# Patient Record
Sex: Male | Born: 1965 | Race: White | Hispanic: No | Smoking: Former smoker
Health system: Southern US, Community
[De-identification: ages and names within clinical notes are randomized; demographics above are authoritative.]

## PROBLEM LIST (undated history)

## (undated) DIAGNOSIS — M1990 Unspecified osteoarthritis, unspecified site: Secondary | ICD-10-CM

## (undated) DIAGNOSIS — C801 Malignant (primary) neoplasm, unspecified: Secondary | ICD-10-CM

## (undated) DIAGNOSIS — C229 Malignant neoplasm of liver, not specified as primary or secondary: Secondary | ICD-10-CM

## (undated) DIAGNOSIS — E119 Type 2 diabetes mellitus without complications: Secondary | ICD-10-CM

## (undated) DIAGNOSIS — I10 Essential (primary) hypertension: Secondary | ICD-10-CM

## (undated) HISTORY — PX: LIVER LOBECTOMY: SHX1976

## (undated) HISTORY — PX: HERNIA REPAIR: SHX51

---

## 2019-04-02 ENCOUNTER — Emergency Department: Payer: Medicare (Managed Care)

## 2019-04-02 ENCOUNTER — Inpatient Hospital Stay
Admission: EM | Admit: 2019-04-02 | Discharge: 2019-04-05 | DRG: 872 | Disposition: A | Discharge status: Home / Self Care | Payer: Medicare (Managed Care) | Attending: Internal Medicine | Admitting: Internal Medicine

## 2019-04-02 ENCOUNTER — Other Ambulatory Visit: Payer: Self-pay

## 2019-04-02 DIAGNOSIS — Y92410 Unspecified street and highway as the place of occurrence of the external cause: Secondary | ICD-10-CM | POA: Diagnosis not present

## 2019-04-02 DIAGNOSIS — Z794 Long term (current) use of insulin: Secondary | ICD-10-CM | POA: Diagnosis not present

## 2019-04-02 DIAGNOSIS — F1110 Opioid abuse, uncomplicated: Secondary | ICD-10-CM | POA: Diagnosis present

## 2019-04-02 DIAGNOSIS — Z8719 Personal history of other diseases of the digestive system: Secondary | ICD-10-CM

## 2019-04-02 DIAGNOSIS — A419 Sepsis, unspecified organism: Secondary | ICD-10-CM | POA: Diagnosis present

## 2019-04-02 DIAGNOSIS — R0781 Pleurodynia: Secondary | ICD-10-CM | POA: Diagnosis present

## 2019-04-02 DIAGNOSIS — G720 Drug-induced myopathy: Secondary | ICD-10-CM | POA: Diagnosis present

## 2019-04-02 DIAGNOSIS — I3139 Other pericardial effusion (noninflammatory): Secondary | ICD-10-CM | POA: Diagnosis present

## 2019-04-02 DIAGNOSIS — E785 Hyperlipidemia, unspecified: Secondary | ICD-10-CM | POA: Diagnosis present

## 2019-04-02 DIAGNOSIS — E1165 Type 2 diabetes mellitus with hyperglycemia: Secondary | ICD-10-CM | POA: Diagnosis present

## 2019-04-02 DIAGNOSIS — K3184 Gastroparesis: Secondary | ICD-10-CM | POA: Diagnosis present

## 2019-04-02 DIAGNOSIS — Z888 Allergy status to other drugs, medicaments and biological substances status: Secondary | ICD-10-CM | POA: Diagnosis not present

## 2019-04-02 DIAGNOSIS — E781 Pure hyperglyceridemia: Secondary | ICD-10-CM | POA: Diagnosis present

## 2019-04-02 DIAGNOSIS — I313 Pericardial effusion (noninflammatory): Secondary | ICD-10-CM | POA: Diagnosis present

## 2019-04-02 DIAGNOSIS — F329 Major depressive disorder, single episode, unspecified: Secondary | ICD-10-CM | POA: Diagnosis present

## 2019-04-02 DIAGNOSIS — M47812 Spondylosis without myelopathy or radiculopathy, cervical region: Secondary | ICD-10-CM | POA: Diagnosis present

## 2019-04-02 DIAGNOSIS — I10 Essential (primary) hypertension: Secondary | ICD-10-CM | POA: Diagnosis present

## 2019-04-02 DIAGNOSIS — F1410 Cocaine abuse, uncomplicated: Secondary | ICD-10-CM | POA: Diagnosis present

## 2019-04-02 DIAGNOSIS — Z79899 Other long term (current) drug therapy: Secondary | ICD-10-CM

## 2019-04-02 DIAGNOSIS — Z20828 Contact with and (suspected) exposure to other viral communicable diseases: Secondary | ICD-10-CM | POA: Diagnosis present

## 2019-04-02 DIAGNOSIS — K2210 Ulcer of esophagus without bleeding: Secondary | ICD-10-CM | POA: Diagnosis present

## 2019-04-02 DIAGNOSIS — Z1159 Encounter for screening for other viral diseases: Secondary | ICD-10-CM

## 2019-04-02 DIAGNOSIS — E1143 Type 2 diabetes mellitus with diabetic autonomic (poly)neuropathy: Secondary | ICD-10-CM | POA: Diagnosis present

## 2019-04-02 DIAGNOSIS — Z87891 Personal history of nicotine dependence: Secondary | ICD-10-CM

## 2019-04-02 DIAGNOSIS — Z8505 Personal history of malignant neoplasm of liver: Secondary | ICD-10-CM | POA: Diagnosis not present

## 2019-04-02 HISTORY — DX: Unspecified osteoarthritis, unspecified site: M19.90

## 2019-04-02 HISTORY — DX: Essential (primary) hypertension: I10

## 2019-04-02 HISTORY — DX: Type 2 diabetes mellitus without complications: E11.9

## 2019-04-02 HISTORY — DX: Malignant (primary) neoplasm, unspecified: C80.1

## 2019-04-02 LAB — CBC WITH DIFFERENTIAL/PLATELET
Abs Immature Granulocytes: 0.02 10*3/uL (ref 0.00–0.07)
Basophils Absolute: 0 10*3/uL (ref 0.0–0.1)
Basophils Relative: 0 %
Eosinophils Absolute: 0 10*3/uL (ref 0.0–0.5)
Eosinophils Relative: 0 %
HCT: 31 % — ABNORMAL LOW (ref 39.0–52.0)
Hemoglobin: 10 g/dL — ABNORMAL LOW (ref 13.0–17.0)
Immature Granulocytes: 0 %
Lymphocytes Relative: 21 %
Lymphs Abs: 1.8 10*3/uL (ref 0.7–4.0)
MCH: 28.2 pg (ref 26.0–34.0)
MCHC: 32.3 g/dL (ref 30.0–36.0)
MCV: 87.3 fL (ref 80.0–100.0)
Monocytes Absolute: 0.8 10*3/uL (ref 0.1–1.0)
Monocytes Relative: 9 %
Neutro Abs: 6 10*3/uL (ref 1.7–7.7)
Neutrophils Relative %: 70 %
Platelets: 239 10*3/uL (ref 150–400)
RBC: 3.55 MIL/uL — ABNORMAL LOW (ref 4.22–5.81)
RDW: 13.6 % (ref 11.5–15.5)
WBC: 8.6 10*3/uL (ref 4.0–10.5)
nRBC: 0 % (ref 0.0–0.2)

## 2019-04-02 LAB — COMPREHENSIVE METABOLIC PANEL
ALT: 35 U/L (ref 0–44)
AST: 31 U/L (ref 15–41)
Albumin: 3.2 g/dL — ABNORMAL LOW (ref 3.5–5.0)
Alkaline Phosphatase: 119 U/L (ref 38–126)
Anion gap: 10 (ref 5–15)
BUN: 12 mg/dL (ref 6–20)
CO2: 25 mmol/L (ref 22–32)
Calcium: 8.4 mg/dL — ABNORMAL LOW (ref 8.9–10.3)
Chloride: 98 mmol/L (ref 98–111)
Creatinine, Ser: 0.9 mg/dL (ref 0.61–1.24)
GFR calc Af Amer: >60 mL/min (ref >60)
GFR calc non Af Amer: >60 mL/min (ref >60)
Glucose, Bld: 205 mg/dL — ABNORMAL HIGH (ref 70–99)
Potassium: 3.9 mmol/L (ref 3.5–5.1)
Sodium: 133 mmol/L — ABNORMAL LOW (ref 135–145)
Total Bilirubin: 1.1 mg/dL (ref 0.3–1.2)
Total Protein: 7.8 g/dL (ref 6.5–8.1)

## 2019-04-02 LAB — URINE DRUG SCREEN, QUALITATIVE (ARMC ONLY)
Amphetamines, Ur Screen: NOT DETECTED
Barbiturates, Ur Screen: NOT DETECTED
Benzodiazepine, Ur Scrn: NOT DETECTED
Cannabinoid 50 Ng, Ur ~~LOC~~: POSITIVE — AB
Cocaine Metabolite,Ur ~~LOC~~: POSITIVE — AB
MDMA (Ecstasy)Ur Screen: NOT DETECTED
Methadone Scn, Ur: NOT DETECTED
Opiate, Ur Screen: POSITIVE — AB
Phencyclidine (PCP) Ur S: NOT DETECTED
Tricyclic, Ur Screen: NOT DETECTED

## 2019-04-02 LAB — PROTIME-INR
INR: 1.2 (ref 0.8–1.2)
Prothrombin Time: 14.8 seconds (ref 11.4–15.2)

## 2019-04-02 LAB — PROCALCITONIN: Procalcitonin: 0.1 ng/mL

## 2019-04-02 LAB — LIPASE, BLOOD: Lipase: 19 U/L (ref 11–51)

## 2019-04-02 LAB — LACTIC ACID, PLASMA
Lactic Acid, Venous: 1.8 mmol/L (ref 0.5–1.9)
Lactic Acid, Venous: 1.9 mmol/L (ref 0.5–1.9)

## 2019-04-02 LAB — URINALYSIS, ROUTINE W REFLEX MICROSCOPIC
Bilirubin Urine: NEGATIVE
Glucose, UA: 50 mg/dL — AB
Hgb urine dipstick: NEGATIVE
Ketones, ur: NEGATIVE mg/dL
Leukocytes,Ua: NEGATIVE
Nitrite: NEGATIVE
Protein, ur: NEGATIVE mg/dL
Specific Gravity, Urine: >1.046 — ABNORMAL HIGH (ref 1.005–1.030)
pH: 5 (ref 5.0–8.0)

## 2019-04-02 LAB — SARS CORONAVIRUS 2 BY RT PCR (HOSPITAL ORDER, PERFORMED IN ~~LOC~~ HOSPITAL LAB): SARS Coronavirus 2: NEGATIVE

## 2019-04-02 LAB — TROPONIN I
Troponin I: <0.03 ng/mL (ref <0.03)
Troponin I: <0.03 ng/mL (ref <0.03)
Troponin I: <0.03 ng/mL (ref <0.03)

## 2019-04-02 LAB — BRAIN NATRIURETIC PEPTIDE: B Natriuretic Peptide: 60 pg/mL (ref 0.0–100.0)

## 2019-04-02 LAB — GLUCOSE, CAPILLARY: Glucose-Capillary: 452 mg/dL — ABNORMAL HIGH (ref 70–99)

## 2019-04-02 MED ORDER — POLYVINYL ALCOHOL 1.4 % OP SOLN
2.0000 [drp] | Freq: Three times a day (TID) | OPHTHALMIC | Status: DC
  Administered 2019-04-02 – 2019-04-05 (×6): 2 [drp] via OPHTHALMIC
  Filled 2019-04-02: qty 15

## 2019-04-02 MED ORDER — SODIUM CHLORIDE 0.9% FLUSH
10.0000 mL | Freq: Two times a day (BID) | INTRAVENOUS | Status: DC
  Administered 2019-04-02 – 2019-04-04 (×4): 10 mL via INTRAVENOUS

## 2019-04-02 MED ORDER — SUCRALFATE 1 G PO TABS
1.0000 g | ORAL_TABLET | Freq: Four times a day (QID) | ORAL | Status: DC
  Administered 2019-04-02 – 2019-04-05 (×11): 1 g via ORAL
  Filled 2019-04-02 (×11): qty 1

## 2019-04-02 MED ORDER — ACETAMINOPHEN 160 MG/5ML PO SOLN
650.0000 mg | ORAL | Status: AC
  Administered 2019-04-02: 650 mg via ORAL
  Filled 2019-04-02: qty 20.3

## 2019-04-02 MED ORDER — OXYCODONE-ACETAMINOPHEN 7.5-325 MG PO TABS
1.0000 | ORAL_TABLET | Freq: Four times a day (QID) | ORAL | Status: DC | PRN
  Administered 2019-04-02 – 2019-04-03 (×4): 1 via ORAL
  Filled 2019-04-02 (×4): qty 1

## 2019-04-02 MED ORDER — SODIUM CHLORIDE 0.9 % IV SOLN
INTRAVENOUS | Status: DC | PRN
  Administered 2019-04-02: 20 mL via INTRAVENOUS
  Administered 2019-04-03: 50 mL via INTRAVENOUS
  Administered 2019-04-03: 30 mL via INTRAVENOUS
  Administered 2019-04-03: 25 mL via INTRAVENOUS

## 2019-04-02 MED ORDER — METOCLOPRAMIDE HCL 10 MG PO TABS
10.0000 mg | ORAL_TABLET | Freq: Three times a day (TID) | ORAL | Status: DC
  Administered 2019-04-02 – 2019-04-05 (×11): 10 mg via ORAL
  Filled 2019-04-02 (×11): qty 1

## 2019-04-02 MED ORDER — INSULIN ASPART 100 UNIT/ML ~~LOC~~ SOLN
10.0000 [IU] | Freq: Once | SUBCUTANEOUS | Status: AC
  Administered 2019-04-02: 10 [IU] via SUBCUTANEOUS
  Filled 2019-04-02: qty 1

## 2019-04-02 MED ORDER — IOHEXOL 350 MG/ML SOLN
75.0000 mL | Freq: Once | INTRAVENOUS | Status: AC | PRN
  Administered 2019-04-02: 75 mL via INTRAVENOUS
  Filled 2019-04-02: qty 75

## 2019-04-02 MED ORDER — DICYCLOMINE HCL 10 MG PO CAPS
10.0000 mg | ORAL_CAPSULE | Freq: Four times a day (QID) | ORAL | Status: DC
  Administered 2019-04-02 – 2019-04-05 (×11): 10 mg via ORAL
  Filled 2019-04-02 (×11): qty 1

## 2019-04-02 MED ORDER — SODIUM CHLORIDE 0.9 % IV BOLUS
1000.0000 mL | Freq: Once | INTRAVENOUS | Status: AC
  Administered 2019-04-02: 1000 mL via INTRAVENOUS

## 2019-04-02 MED ORDER — ENOXAPARIN SODIUM 40 MG/0.4ML ~~LOC~~ SOLN
40.0000 mg | SUBCUTANEOUS | Status: DC
  Administered 2019-04-02 – 2019-04-04 (×3): 40 mg via SUBCUTANEOUS
  Filled 2019-04-02 (×3): qty 0.4

## 2019-04-02 MED ORDER — SODIUM CHLORIDE 0.9 % IV SOLN
2.0000 g | Freq: Once | INTRAVENOUS | Status: AC
  Administered 2019-04-02: 2 g via INTRAVENOUS
  Filled 2019-04-02: qty 2

## 2019-04-02 MED ORDER — VANCOMYCIN HCL 10 G IV SOLR
1250.0000 mg | Freq: Two times a day (BID) | INTRAVENOUS | Status: DC
  Filled 2019-04-02 (×2): qty 1250

## 2019-04-02 MED ORDER — VANCOMYCIN HCL 1.25 G IV SOLR
1250.0000 mg | Freq: Two times a day (BID) | INTRAVENOUS | Status: DC
  Administered 2019-04-02 – 2019-04-04 (×4): 1250 mg via INTRAVENOUS
  Filled 2019-04-02 (×5): qty 1250

## 2019-04-02 MED ORDER — TAMSULOSIN HCL 0.4 MG PO CAPS
0.4000 mg | ORAL_CAPSULE | Freq: Every day | ORAL | Status: DC
  Administered 2019-04-02 – 2019-04-05 (×4): 0.4 mg via ORAL
  Filled 2019-04-02 (×4): qty 1

## 2019-04-02 MED ORDER — SODIUM CHLORIDE 0.9 % IV SOLN
2.0000 g | Freq: Three times a day (TID) | INTRAVENOUS | Status: DC
  Administered 2019-04-02 – 2019-04-04 (×6): 2 g via INTRAVENOUS
  Filled 2019-04-02 (×8): qty 2

## 2019-04-02 MED ORDER — MORPHINE SULFATE (PF) 4 MG/ML IV SOLN
4.0000 mg | Freq: Once | INTRAVENOUS | Status: AC
  Administered 2019-04-02: 4 mg via INTRAVENOUS
  Filled 2019-04-02: qty 1

## 2019-04-02 MED ORDER — INSULIN ASPART 100 UNIT/ML ~~LOC~~ SOLN
0.0000 [IU] | Freq: Every day | SUBCUTANEOUS | Status: DC
  Administered 2019-04-03: 3 [IU] via SUBCUTANEOUS
  Filled 2019-04-02: qty 1

## 2019-04-02 MED ORDER — INSULIN ASPART 100 UNIT/ML ~~LOC~~ SOLN
0.0000 [IU] | Freq: Three times a day (TID) | SUBCUTANEOUS | Status: DC
  Administered 2019-04-03 (×2): 5 [IU] via SUBCUTANEOUS
  Administered 2019-04-03: 9 [IU] via SUBCUTANEOUS
  Administered 2019-04-04: 5 [IU] via SUBCUTANEOUS
  Filled 2019-04-02 (×3): qty 1

## 2019-04-02 MED ORDER — INSULIN GLARGINE 100 UNIT/ML ~~LOC~~ SOLN
40.0000 [IU] | Freq: Two times a day (BID) | SUBCUTANEOUS | Status: DC
  Administered 2019-04-02 – 2019-04-03 (×3): 40 [IU] via SUBCUTANEOUS
  Filled 2019-04-02 (×6): qty 0.4

## 2019-04-02 MED ORDER — PANTOPRAZOLE SODIUM 40 MG PO TBEC
40.0000 mg | DELAYED_RELEASE_TABLET | Freq: Two times a day (BID) | ORAL | Status: DC
  Administered 2019-04-02 – 2019-04-05 (×6): 40 mg via ORAL
  Filled 2019-04-02 (×6): qty 1

## 2019-04-02 MED ORDER — MORPHINE SULFATE (PF) 2 MG/ML IV SOLN
2.0000 mg | INTRAVENOUS | Status: DC | PRN
  Administered 2019-04-02: 2 mg via INTRAVENOUS
  Filled 2019-04-02 (×2): qty 1

## 2019-04-02 MED ORDER — NEOMYCIN-POLYMYXIN-DEXAMETH 3.5-10000-0.1 OP SUSP
1.0000 [drp] | Freq: Four times a day (QID) | OPHTHALMIC | Status: DC
  Administered 2019-04-02 – 2019-04-05 (×9): 1 [drp] via OPHTHALMIC
  Filled 2019-04-02 (×2): qty 5

## 2019-04-02 MED ORDER — VANCOMYCIN HCL IN DEXTROSE 1-5 GM/200ML-% IV SOLN
1000.0000 mg | Freq: Once | INTRAVENOUS | Status: AC
  Administered 2019-04-02: 1000 mg via INTRAVENOUS
  Filled 2019-04-02: qty 200

## 2019-04-02 NOTE — ED Notes (Signed)
Patient transported to CT 

## 2019-04-02 NOTE — ED Notes (Signed)
ED TO INPATIENT HANDOFF REPORT  ED Nurse Name and Phone #: Gwenette Greet [phone]  S Name/Age/Gender Kenneth Copeland 53 y.o. male Room/Bed: ED30A/ED30A  Code Status   Code Status: Full Code  Home/SNF/Other Selfcare Patient oriented to: self, place, time and situation Is this baseline? Yes   Triage Complete: Triage complete  Chief Complaint Difficulty Breathing   Triage Note Pt c/o SOB, weakness, and ongoing chest pain for several weeks. States he is struggling to breathe deeply and it hurts to breathe. CP goes across chest. Pt is pale, clammy, and appears SOB upon arrival. Pt states he was recently dc'd from hospital in [place], for the same symptoms. Pt. States CP worse when lying down. A&O x 4 upon arrival to ED.    Allergies Allergies  Allergen Reactions  . Metformin And Related     Pt. States it makes his stomach bleed because of a stomach ulcer  . Motrin [Ibuprofen]     Pt. States it "inflames his stomach, causes bleeding in stomach"    Level of Care/Admitting Diagnosis ED Disposition    ED Disposition Condition [place]: [place] [[id number]]  Level of Care: Med-Surg [16]  Covid Evaluation: Person Under Investigation (PUI)  Isolation Risk Level: Low Risk/Droplet (Less than 4L [place] supplementation)  Diagnosis: Pericardial effusion [[id number]]  Admitting Physician: Lang Snow [[id number]]  Attending Physician: Hyman Bible DODD [[id number]]  Estimated length of stay: past midnight tomorrow  Certification:: I certify this patient will need inpatient services for at least 2 midnights  PT Class (Do Not Modify): Inpatient [101]  PT Acc Code (Do Not Modify): Private [1]       B Medical/Surgery History Past Medical History:  Diagnosis Date  . Arthritis   . Cancer ([place])   . Diabetes mellitus without complication ([place])   . Hypertension    Past Surgical History:  Procedure Laterality Date  . HERNIA REPAIR    . LIVER  LOBECTOMY       A IV Location/Drains/Wounds Patient Lines/Drains/Airways Status   Active Line/Drains/Airways    Name:   Placement date:   Placement time:   Site:   Days:   Peripheral IV 04/02/19 Right Antecubital   04/02/19    0900    Antecubital   less than 1   Peripheral IV 04/02/19 Left Forearm   04/02/19    0927    Forearm   less than 1          Intake/Output Last 24 hours  Intake/Output Summary (Last 24 hours) at 04/02/2019 1548 Last data filed at 04/02/2019 1323 Gross per 24 hour  Intake 1199 ml  Output -  Net 1199 ml    Labs/Imaging Results for orders placed or performed during the hospital encounter of 04/02/19 (from the past 48 hour(s))  CBC with Differential     Status: Abnormal   Collection Time: 04/02/19  9:09 AM  Result Value Ref Range   WBC 8.6 4.0 - 10.5 K/uL   RBC 3.55 (L) 4.22 - 5.81 MIL/uL   Hemoglobin 10.0 (L) 13.0 - 17.0 g/dL   HCT 31.0 (L) 39.0 - 52.0 %   MCV 87.3 80.0 - 100.0 fL   MCH 28.2 26.0 - 34.0 pg   MCHC 32.3 30.0 - 36.0 g/dL   RDW 13.6 11.5 - 15.5 %   Platelets 239 150 - 400 K/uL   nRBC 0.0 0.0 - 0.2 %   Neutrophils Relative % 70 %   Neutro  Abs 6.0 1.7 - 7.7 K/uL   Lymphocytes Relative 21 %   Lymphs Abs 1.8 0.7 - 4.0 K/uL   Monocytes Relative 9 %   Monocytes Absolute 0.8 0.1 - 1.0 K/uL   Eosinophils Relative 0 %   Eosinophils Absolute 0.0 0.0 - 0.5 K/uL   Basophils Relative 0 %   Basophils Absolute 0.0 0.0 - 0.1 K/uL   Immature Granulocytes 0 %   Abs Immature Granulocytes 0.02 0.00 - 0.07 K/uL    Comment: Performed at [facility], [address] [place], [place] [address]  Comprehensive metabolic panel     Status: Abnormal   Collection Time: 04/02/19  9:09 AM  Result Value Ref Range   Sodium 133 (L) 135 - 145 mmol/L   Potassium 3.9 3.5 - 5.1 mmol/L   Chloride 98 98 - 111 mmol/L   CO2 25 22 - 32 mmol/L   Glucose, Bld 205 (H) 70 - 99 mg/dL   BUN 12 6 - 20 mg/dL   Creatinine, Ser 0.90 0.61 - 1.24 mg/dL   Calcium 8.4  (L) 8.9 - 10.3 mg/dL   Total Protein 7.8 6.5 - 8.1 g/dL   Albumin 3.2 (L) 3.5 - 5.0 g/dL   AST 31 15 - 41 U/L   ALT 35 0 - 44 U/L   Alkaline Phosphatase 119 38 - 126 U/L   Total Bilirubin 1.1 0.3 - 1.2 mg/dL   GFR calc non Af Amer >60 >60 mL/min   GFR calc Af Amer >60 >60 mL/min   Anion gap 10 5 - 15    Comment: Performed at [facility], [address] [place], [place] [address]  Troponin I - ONCE - STAT     Status: None   Collection Time: 04/02/19  9:09 AM  Result Value Ref Range   Troponin I <0.03 <0.03 ng/mL    Comment: Performed at [facility], [address] [place], [place] [address]  Brain natriuretic peptide     Status: None   Collection Time: 04/02/19  9:09 AM  Result Value Ref Range   B Natriuretic Peptide 60.0 0.0 - 100.0 pg/mL    Comment: Performed at [facility], [address] [place], [place] [address]  Lactic acid, plasma     Status: None   Collection Time: 04/02/19  9:09 AM  Result Value Ref Range   Lactic Acid, Venous 1.8 0.5 - 1.9 mmol/L    Comment: Performed at [facility], [address] [place], [place] [address]  Lipase, blood     Status: None   Collection Time: 04/02/19  9:09 AM  Result Value Ref Range   Lipase 19 11 - 51 U/L    Comment: Performed at [facility], [address] [place], [place] [address]  Protime-INR     Status: None   Collection Time: 04/02/19  9:09 AM  Result Value Ref Range   Prothrombin Time 14.8 11.4 - 15.2 seconds   INR 1.2 0.8 - 1.2    Comment: (NOTE) INR goal varies based on device and disease states. Performed at [facility], [address] [place], [place] [address]   SARS Coronavirus 2 [facility] order, Performed in [facility] hospital lab)     Status: None   Collection Time: 04/02/19  9:09 AM  Result Value Ref Range   SARS Coronavirus 2 NEGATIVE NEGATIVE    Comment: (NOTE) If result is NEGATIVE SARS-CoV-2 target nucleic acids are NOT  DETECTED. The SARS-CoV-2  RNA is generally detectable in upper and lower  respiratory specimens during the acute phase of infection. The lowest  concentration of SARS-CoV-2 viral copies this assay can detect is 250  copies / mL. A negative result does not preclude SARS-CoV-2 infection  and should not be used as the sole basis for treatment or other  patient management decisions.  A negative result may occur with  improper specimen collection / handling, submission of specimen other  than nasopharyngeal swab, presence of viral mutation(s) within the  areas targeted by this assay, and inadequate number of viral copies  (<250 copies / mL). A negative result must be combined with clinical  observations, patient history, and epidemiological information. If result is POSITIVE SARS-CoV-2 target nucleic acids are DETECTED. The SARS-CoV-2 RNA is generally detectable in upper and lower  respiratory specimens dur ing the acute phase of infection.  Positive  results are indicative of active infection with SARS-CoV-2.  Clinical  correlation with patient history and other diagnostic information is  necessary to determine patient infection status.  Positive results do  not rule out bacterial infection or co-infection with other viruses. If result is PRESUMPTIVE POSTIVE SARS-CoV-2 nucleic acids MAY BE PRESENT.   A presumptive positive result was obtained on the submitted specimen  and confirmed on repeat testing.  While 2019 novel coronavirus  (SARS-CoV-2) nucleic acids may be present in the submitted sample  additional confirmatory testing may be necessary for epidemiological  and / or clinical management purposes  to differentiate between  SARS-CoV-2 and other Sarbecovirus currently known to infect humans.  If clinically indicated additional testing with an alternate test  methodology [phone]) is advised. The SARS-CoV-2 RNA is generally  detectable in upper and lower respiratory sp ecimens during  the acute  phase of infection. The expected result is Negative. Fact Sheet for Patients:  StrictlyIdeas.no Fact Sheet for Healthcare Providers: BankingDealers.co.za This test is not yet approved or cleared by the [place] FDA and has been authorized for detection and/or diagnosis of SARS-CoV-2 by FDA under an Emergency Use Authorization (EUA).  This EUA will remain in effect (meaning this test can be used) for the duration of the COVID-19 declaration under Section 564(b)(1) of the Act, 21 U.S.C. section 360bbb-3(b)(1), unless the authorization is terminated or revoked sooner. Performed at [facility], [address] [place], [place] [address]   Urinalysis, Routine w reflex microscopic     Status: Abnormal   Collection Time: 04/02/19 11:55 AM  Result Value Ref Range   Color, Urine YELLOW (A) YELLOW   APPearance CLEAR (A) CLEAR   Specific Gravity, Urine >1.046 (H) 1.005 - 1.030   pH 5.0 5.0 - 8.0   Glucose, UA 50 (A) NEGATIVE mg/dL   Hgb urine dipstick NEGATIVE NEGATIVE   Bilirubin Urine NEGATIVE NEGATIVE   Ketones, ur NEGATIVE NEGATIVE mg/dL   Protein, ur NEGATIVE NEGATIVE mg/dL   Nitrite NEGATIVE NEGATIVE   Leukocytes,Ua NEGATIVE NEGATIVE    Comment: Performed at [facility], [address], [place], [place] [address]  Urine Drug Screen, Qualitative ([facility] only)     Status: Abnormal   Collection Time: 04/02/19 11:55 AM  Result Value Ref Range   Tricyclic, Ur Screen NONE DETECTED NONE DETECTED   Amphetamines, Ur Screen NONE DETECTED NONE DETECTED   MDMA (Ecstasy)Ur Screen NONE DETECTED NONE DETECTED   Cocaine Metabolite,Ur [place] POSITIVE (A) NONE DETECTED   Opiate, Ur Screen POSITIVE (A) NONE DETECTED   Phencyclidine (PCP) Ur S NONE DETECTED NONE  DETECTED   Cannabinoid 50 Ng, Ur [place] POSITIVE (A) NONE DETECTED   Barbiturates, Ur Screen NONE DETECTED NONE DETECTED   Benzodiazepine, Ur Scrn NONE DETECTED NONE  DETECTED   Methadone Scn, Ur NONE DETECTED NONE DETECTED    Comment: (NOTE) Tricyclics + metabolites, urine    Cutoff 1000 ng/mL Amphetamines + metabolites, urine  Cutoff 1000 ng/mL MDMA (Ecstasy), urine              Cutoff 500 ng/mL Cocaine Metabolite, urine          Cutoff 300 ng/mL Opiate + metabolites, urine        Cutoff 300 ng/mL Phencyclidine (PCP), urine         Cutoff 25 ng/mL Cannabinoid, urine                 Cutoff 50 ng/mL Barbiturates + metabolites, urine  Cutoff 200 ng/mL Benzodiazepine, urine              Cutoff 200 ng/mL Methadone, urine                   Cutoff 300 ng/mL The urine drug screen provides only a preliminary, unconfirmed analytical test result and should not be used for non-medical purposes. Clinical consideration and professional judgment should be applied to any positive drug screen result due to possible interfering substances. A more specific alternate chemical method must be used in order to obtain a confirmed analytical result. Gas chromatography / mass spectrometry (GC/MS) is the preferred confirmat ory method. Performed at [facility], [place], [place] [address]    Ct Angio Chest Pe W And/or Wo Contrast  Result Date: 04/02/2019 CLINICAL DATA:  Shortness of breath, chest pain. EXAM: CT ANGIOGRAPHY CHEST WITH CONTRAST TECHNIQUE: Multidetector CT imaging of the chest was performed using the standard protocol during bolus administration of intravenous contrast. Multiplanar CT image reconstructions and MIPs were obtained to evaluate the vascular anatomy. CONTRAST:  4mL OMNIPAQUE IOHEXOL 350 MG/ML SOLN COMPARISON:  Radiograph of same day. FINDINGS: Cardiovascular: Satisfactory opacification of the pulmonary arteries to the segmental level. No evidence of pulmonary embolism. Normal heart size. Mild to moderate size pericardial effusion is noted. There is no evidence of thoracic aortic dissection or aneurysm. Coronary artery  calcifications are noted. Mediastinum/Nodes: No enlarged mediastinal, hilar, or axillary lymph nodes. Thyroid gland, trachea, and esophagus demonstrate no significant findings. Lungs/Pleura: No pneumothorax is noted. Minimal pleural effusions are noted with adjacent subsegmental atelectasis. Upper Abdomen: No acute abnormality. Musculoskeletal: No chest wall abnormality. No acute or significant osseous findings. Review of the MIP images confirms the above findings. IMPRESSION: No definite evidence of pulmonary embolus. Mild to moderate size pericardial effusion is noted. Minimal bilateral pleural effusions are noted with adjacent subsegmental atelectasis. Electronically Signed   By: Marijo Conception M.D.   On: 04/02/2019 10:28   Dg Chest Port 1 View  Result Date: 04/02/2019 CLINICAL DATA:  Acute shortness of breath and chest pain for several weeks. EXAM: PORTABLE CHEST 1 VIEW COMPARISON:  None. FINDINGS: UPPER limits normal heart size noted. This is a low volume film with mild bibasilar atelectasis. There may be trace bilateral pleural effusions present. No definite airspace disease or pneumothorax. No acute bony abnormalities. IMPRESSION: Low volume film with mild bibasilar atelectasis. UPPER limits normal heart size with possible trace bilateral pleural effusions. Electronically Signed   By: Margarette Canada M.D.   On: 04/02/2019 09:41    Pending Labs Unresulted Labs (From admission,  onward)    Start     Ordered   04/09/19 0500  Creatinine, serum  (enoxaparin (LOVENOX)    CrCl >/= 30 ml/min)  Weekly,   STAT    Comments:  while on enoxaparin therapy    04/02/19 1332   04/03/19 0500  Procalcitonin  Daily,   STAT     04/02/19 1256   04/03/19 0500  Cortisol-am, blood  Tomorrow morning,   STAT     04/02/19 1332   04/03/19 0500  Comprehensive metabolic panel  Tomorrow morning,   STAT     04/02/19 1332   04/03/19 0500  CBC  Tomorrow morning,   STAT     04/02/19 1332   04/02/19 1330  CBC  (enoxaparin  (LOVENOX)    CrCl >/= 30 ml/min)  Once,   STAT    Comments:  Baseline for enoxaparin therapy IF NOT ALREADY DRAWN.  Notify MD if PLT < 100 K.    04/02/19 1332   04/02/19 1330  Creatinine, serum  (enoxaparin (LOVENOX)    CrCl >/= 30 ml/min)  Once,   STAT    Comments:  Baseline for enoxaparin therapy IF NOT ALREADY DRAWN.    04/02/19 1332   04/02/19 1256  Procalcitonin - Baseline  ONCE - STAT,   STAT     04/02/19 1256   04/02/19 0859  Lactic acid, plasma  Now then every 2 hours,   STAT     04/02/19 0901   04/02/19 0859  Blood Culture (routine x 2)  BLOOD CULTURE X 2,   STAT     04/02/19 0901          Vitals/Pain Today's Vitals   04/02/19 1153 04/02/19 1206 04/02/19 1318 04/02/19 1540  BP: 101/74  102/79 93/72  Pulse: (!) 105  (!) 103 (!) 119  Resp: 18     Temp:      TempSrc:      SpO2: 98%  97% 96%  Weight:      Height:      PainSc:  8       Isolation Precautions No active isolations  Medications Medications  enoxaparin (LOVENOX) injection 40 mg (has no administration in time range)  vancomycin (VANCOCIN) IVPB 1000 mg/200 mL premix (0 mg Intravenous Stopped 04/02/19 1130)  ceFEPIme (MAXIPIME) 2 g in sodium chloride 0.9 % 100 mL IVPB (0 g Intravenous Stopped 04/02/19 0938)  acetaminophen (TYLENOL) solution 650 mg (650 mg Oral Given 04/02/19 0930)  sodium chloride 0.9 % bolus 1,000 mL (0 mLs Intravenous Stopped 04/02/19 1323)  morphine 4 MG/ML injection 4 mg (4 mg Intravenous Given 04/02/19 0927)  iohexol (OMNIPAQUE) 350 MG/ML injection 75 mL (75 mLs Intravenous Contrast Given 04/02/19 1005)  morphine 4 MG/ML injection 4 mg (4 mg Intravenous Given 04/02/19 1151)    Mobility walks Moderate fall risk   Focused Assessments See assessments   R Recommendations: See Admitting Provider Note  Report given to:   Additional Notes: Pt has had multiple negative covid tests, per pt. Brought in from hotel. Pt tested covid negative today.

## 2019-04-02 NOTE — H&P (Addendum)
[place] [facility] [place] NAME: Kenneth Copeland    MR#:  [id number]  DATE OF BIRTH:  September 15, 1966  DATE OF ADMISSION:  04/02/2019  PRIMARY CARE PHYSICIAN: No primary care provider on file.   REQUESTING/REFERRING PHYSICIAN: Delman Kitten MD  CHIEF COMPLAINT:   Chief Complaint  Patient presents with  . Shortness of Breath  . Chest Pain    HISTORY OF PRESENT ILLNESS:  Kenneth Copeland  is a 53 y.o. 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  Patient reports that he is originally from [place] and is in the process of relocating to [place].  He states that he presented to [facility] with chief complaints of hematuria, dysuria and bilateral flank pain and was admitted for prostatitis. During hospitalization he persistently had a fever  101.45F.4/21 UCx was positive for MRSA, with digital rectal exam concerning for prostatitis. 4/22 BCx negative, 4/23 BCx negative, C.diff negative, Urine legionella negative, Urine strep pneumo negative, HBV/HCV panel negative, CT-abd did not show any signs of recurrent cancer (iso past HCC vs cholangiocarcinoma). He was treated with bactrim, with which his symptoms began to improve. He was also started on flomax. Patient report that he did not complete the full dose of Bactrim as recommended. Patient reports that since discharge he has continued to have progressive chest discomfort that radiates across his chest into his  bilateral shoulder left greater than right.  He states that pain is worse with deep breathing sometimes movement.  Denies associated symptoms of diaphoresis, nausea vomiting, diarrhea, coughing up blood, or recent weight loss.  He however endorses shortness of breath, fevers and chills.  Patient states he decided to come to the ED due to worsening symptoms.  On arrival to the ED, he was febrile with a temperature of 101.2 F, blood pressure 93/72, HR 116 beats/min and SpO2 98% on room air. There were no focal neurological deficits; he was alert and oriented x4, and he did not demonstrate any memory deficits.  Labs revealed unremarkable CBC, sodium 133, glucose 2 5, albumin 3.2, troponin < 0.03, normal BNP, lactic acid 1.8, lipase 19, UA negative for UTI, however UDS positive for cocaine, opiates.  He was also tested for SARS Coronavirus 2 which was negative.  Patient's chest x-ray showed low volume film with mild bibasilar atelectasis with possible trace bilateral pleural effusion.  Follow-up CT Angio test showed no evidence of pulmonary embolism, minimal bilateral pleural effusion again noted.  He was placed on empiric cefepime and vancomycin for possible sepsis.  Patient will be admitted to hospitalist service for further evaluation and management of his symptoms.  PAST MEDICAL HISTORY:   Past Medical History:  Diagnosis Date  . Arthritis   . Cancer ([place])   . Diabetes mellitus without complication ([place])   . Hypertension     PAST SURGICAL HISTORY:   Past Surgical History:  Procedure Laterality Date  . HERNIA REPAIR    . LIVER LOBECTOMY      SOCIAL HISTORY:   Social History   Tobacco Use  . Smoking status: Former Research scientist (life sciences)  . Smokeless tobacco: Never Used  Substance Use  Topics  . Alcohol use: Not Currently    FAMILY HISTORY:  History reviewed. No pertinent family history.  DRUG ALLERGIES:   Allergies  Allergen Reactions  . Metformin And Related     Pt. States it makes his  stomach bleed because of a stomach ulcer  . Motrin [Ibuprofen]     Pt. States it "inflames his stomach, causes bleeding in stomach"    REVIEW OF SYSTEMS:   Review of Systems  Constitutional: Positive for chills and fever. Negative for diaphoresis, malaise/fatigue and weight loss.  HENT: Negative for congestion, hearing loss, sore throat and tinnitus.   Eyes: Negative for blurred vision, double vision and photophobia.  Respiratory: Positive for shortness of breath. Negative for cough, hemoptysis, sputum production and wheezing.   Cardiovascular: Positive for chest pain. Negative for palpitations, orthopnea, claudication and leg swelling.  Gastrointestinal: Positive for heartburn. Negative for abdominal pain, diarrhea, nausea and vomiting.  Genitourinary: Negative for dysuria, flank pain, frequency, hematuria and urgency.  Musculoskeletal: Negative for myalgias.  Skin: Positive for itching. Negative for rash.  Neurological: Negative for dizziness, sensory change, speech change, focal weakness, seizures, weakness and headaches.  Psychiatric/Behavioral: Positive for depression and substance abuse. Negative for suicidal ideas.   MEDICATIONS AT HOME:   Prior to Admission medications   Medication Sig Start Date End Date Taking? Authorizing Provider  dicyclomine (BENTYL) 10 MG capsule Take 10 mg by mouth 4 (four) times daily -  before meals and at bedtime.   Yes [provider]  DULoxetine (CYMBALTA) 30 MG capsule Take 30 mg by mouth daily.   Yes [provider]  insulin glargine (LANTUS) 100 UNIT/ML injection Inject 40 Units into the skin 2 (two) times daily.    Yes [provider]  insulin lispro (HUMALOG) 100 UNIT/ML injection Inject into the skin 3 (three) times daily before meals.   Yes [provider]  lisinopril (ZESTRIL) 5 MG tablet Take 5 mg by mouth daily.   Yes [provider]  metoCLOPramide (REGLAN) 10 MG tablet Take 10 mg by mouth 4  (four) times daily.   Yes [provider]  pantoprazole (PROTONIX) 40 MG tablet Take 40 mg by mouth daily.   Yes [provider]  sucralfate (CARAFATE) 1 g tablet Take 1 g by mouth 4 (four) times daily -  with meals and at bedtime.   Yes [provider]  tamsulosin (FLOMAX) 0.4 MG CAPS capsule Take 0.4 mg by mouth.   Yes [provider]      VITAL SIGNS:  Blood pressure 93/72, pulse (!) 119, temperature (!) 101.2 F (38.4 C), temperature source Oral, resp. rate 18, height 5\' 11"  (1.803 m), weight 81.6 kg, SpO2 96 %.  PHYSICAL EXAMINATION:   Physical Exam  GENERAL:  53 y.o.-year-old patient lying in the bed with no acute distress.  EYES: Pupils equal, round, reactive to light and accommodation. No scleral icterus. Extraocular muscles intact.  HEENT: Head atraumatic, normocephalic. Oropharynx and nasopharynx clear.  NECK:  Supple, no jugular venous distention. No thyroid enlargement, no tenderness.  LUNGS: Normal breath sounds bilaterally, no wheezing, rales,rhonchi or crepitation. No use of accessory muscles of respiration.  CARDIOVASCULAR: S1, S2 normal. No murmurs, rubs, or gallops.  ABDOMEN: Soft, nontender, nondistended. Bowel sounds present. No organomegaly or mass.  EXTREMITIES: No pedal edema, cyanosis, or clubbing.  MUSCULOSKELETAL: Tenderness in bilateral subscapular region left>right. Decreased ROM in the left arm due to pain NEUROLOGIC: Mental Status:Alert, oriented, thought content appropriate.  Speech fluent without  evidence of aphasia.  Able to follow 3 step commands without difficulty. Attention span and concentration seemed appropriate  Cranial Nerves: II: Discs flat bilaterally; Visual fields grossly normal, pupils equal, round, reactive to light and accommodation III,IV, VI: ptosis not present, extra-ocular motions intact bilaterally V,VII: smile symmetric, facial light touch sensation intact VIII: hearing normal bilaterally IX,X: gag  reflex present XI: bilateral shoulder shrug XII: midline tongue extension Muscle strength 5/5 in all extremities.  Tone and bulk:normal tone throughout; no atrophy noted Sensory: Pinprick and light touch intact bilaterally Deep Tendon Reflexes: 2+ and symmetric throughout Cerebellar:Absent ataxia Gait: not tested due to safety concerns PSYCHIATRIC: The patient is alert and oriented x 3.  SKIN: No obvious rash, lesion, or ulcer.   DATA REVIEWED:  LABORATORY PANEL:   CBC Recent Labs  Lab 04/02/19 0909  WBC 8.6  HGB 10.0*  HCT 31.0*  PLT 239   ------------------------------------------------------------------------------------------------------------------  Chemistries  Recent Labs  Lab 04/02/19 0909  NA 133*  K 3.9  CL 98  CO2 25  GLUCOSE 205*  BUN 12  CREATININE 0.90  CALCIUM 8.4*  AST 31  ALT 35  ALKPHOS 119  BILITOT 1.1   ------------------------------------------------------------------------------------------------------------------  Cardiac Enzymes Recent Labs  Lab 04/02/19 0909  TROPONINI <0.03   ------------------------------------------------------------------------------------------------------------------  RADIOLOGY:  Ct Angio Chest Pe W And/or Wo Contrast  Result Date: 04/02/2019 CLINICAL DATA:  Shortness of breath, chest pain. EXAM: CT ANGIOGRAPHY CHEST WITH CONTRAST TECHNIQUE: Multidetector CT imaging of the chest was performed using the standard protocol during bolus administration of intravenous contrast. Multiplanar CT image reconstructions and MIPs were obtained to evaluate the vascular anatomy. CONTRAST:  14mL OMNIPAQUE IOHEXOL 350 MG/ML SOLN COMPARISON:  Radiograph of same day. FINDINGS: Cardiovascular: Satisfactory opacification of the pulmonary arteries to the segmental level. No evidence of pulmonary embolism. Normal heart size. Mild to moderate size pericardial effusion is noted. There is no evidence of thoracic aortic dissection or  aneurysm. Coronary artery calcifications are noted. Mediastinum/Nodes: No enlarged mediastinal, hilar, or axillary lymph nodes. Thyroid gland, trachea, and esophagus demonstrate no significant findings. Lungs/Pleura: No pneumothorax is noted. Minimal pleural effusions are noted with adjacent subsegmental atelectasis. Upper Abdomen: No acute abnormality. Musculoskeletal: No chest wall abnormality. No acute or significant osseous findings. Review of the MIP images confirms the above findings. IMPRESSION: No definite evidence of pulmonary embolus. Mild to moderate size pericardial effusion is noted. Minimal bilateral pleural effusions are noted with adjacent subsegmental atelectasis. Electronically Signed   By: Marijo Conception M.D.   On: 04/02/2019 10:28   Dg Chest Port 1 View  Result Date: 04/02/2019 CLINICAL DATA:  Acute shortness of breath and chest pain for several weeks. EXAM: PORTABLE CHEST 1 VIEW COMPARISON:  None. FINDINGS: UPPER limits normal heart size noted. This is a low volume film with mild bibasilar atelectasis. There may be trace bilateral pleural effusions present. No definite airspace disease or pneumothorax. No acute bony abnormalities. IMPRESSION: Low volume film with mild bibasilar atelectasis. UPPER limits normal heart size with possible trace bilateral pleural effusions. Electronically Signed   By: Margarette Canada M.D.   On: 04/02/2019 09:41   EKG:  EKG: unchanged from previous tracings, sinus tachycardia. Vent. rate 114 BPM PR interval 128 ms QRS duration 84 ms QT/QTc 350/482 ms P-R-T axes 74 -35 41  IMPRESSION AND PLAN:   53 y.o. 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  1. Sepsis - Unknown etiology.  Patient presenting with a fever of 101.2, tachycardia, complaints of fevers and chills also with recent admission at [facility] for prostatitis on 4/21 treated with Vanc/Cefepime/Flagyl which was switched to Bactrim. - Admit to medsurg unit with telemetry monitoring - Patient has been started on empiric antibiotics cefepime and vancomycin - Blood cultures pending - Procalcitonin pending - Gentle IVFs in the setting of pericardial effusion with minimal pleural effusion - Repeats labs in am  2.  Pleuritic chest pain - On review of his chart from [facility] he has hx of erosive esophagitis - Labs thus far reveal normal BNP, cardiac markers negative, chest x-ray showed normal heart size with trace bilateral pleural effusion, will CT chest shows mild to moderate pericardial effusion with minimal pleural effusion. - Cardiology consult - Echocardiogram pending -Trend troponins - PRN pain management  3. Insulin-dependent Diabetes Mellitus -poorly controlled with refractory Gastroparesis - HbA1c 12.8% on 03/13/2019  - We will start him on sliding scale coverage - Holding Lantus for now but may need to restart this during hospitalization - Diabetes coordinator to evaluate  4. HTN - Blood pressure on admission 93/72 - Lisinopril held during his hospitalization in setting of possible sepsis - Will resume bp meds once stable  5.  Erosive esophagitis -EGD 08/2018 revealed erosive esophagitis, he has been on sucralfate and protonix - Continue home dose sucralfate 1g QID before meals and nightly - Continue Protonix 40 mg twice daily  6. 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  7. DVT prophylaxis - Lovenox    All the records  are reviewed and case discussed with ED provider. Management plans discussed with the patient, family and they are in agreement.  CODE STATUS: FULL  TOTAL TIME TAKING CARE OF THIS PATIENT: 60 minutes.    on 04/02/2019 at 4:13 PM  This patient was staffed with Dr. Valetta Fuller, [facility]  who personally evaluated patient, reviewed documentation and agreed with assessment and plan of care as above.  Rufina Falco, DNP, FNP-BC [facility] Nurse Practitioner Between 7am to 6pm - Pager [phone]  After 6pm go to [email or web address] - password [email or web address] [facility]  Office  [phone]  CC: Primary care physician; No primary care provider on file.

## 2019-04-02 NOTE — ED Notes (Signed)
BP 96/75. Pt states slightly dizzy. Was told not to get up without calling for help. Pt appears pale, alert and oriented x4. NAD. Pain decreased.

## 2019-04-02 NOTE — ED Notes (Signed)
Dr. Mayo at bedside 

## 2019-04-02 NOTE — ED Notes (Signed)
Order obtained for pt to drink water and or juice per pt request. Cranberry juice given. Pt tolerating at this time.

## 2019-04-02 NOTE — Progress Notes (Signed)
CODE SEPSIS - PHARMACY COMMUNICATION  **Broad Spectrum Antibiotics should be administered within 1 hour of Sepsis diagnosis**  Time Code Sepsis Called/Page Received: 09:01  Antibiotics Ordered: Vancomycin and Cefepime  Time of 1st antibiotic administration: Cefepime given at 09:37  Additional action taken by pharmacy: N/A  If necessary, Name of Provider/Nurse Contacted: N/A    Vira Blanco ,PharmD Clinical Pharmacist  04/02/2019  9:46 AM

## 2019-04-02 NOTE — ED Notes (Signed)
Admitting NP at bedside at this time.

## 2019-04-02 NOTE — ED Triage Notes (Addendum)
Pt c/o SOB, weakness, and ongoing chest pain for several weeks. States he is struggling to breathe deeply and it hurts to breathe. CP goes across chest. Pt is pale, clammy, and appears SOB upon arrival. Pt states he was recently dc'd from hospital in [place], for the same symptoms. Pt. States CP worse when lying down. A&O x 4 upon arrival to ED.

## 2019-04-02 NOTE — ED Provider Notes (Signed)
[facility] Emergency Department Provider Note   ____________________________________________   First MD Initiated Contact with Patient 04/02/19 [phone]     (approximate)  I have reviewed the triage vital signs and the nursing notes.   HISTORY  Chief Complaint Shortness of Breath and Chest Pain    HPI Kenneth Kenneth Copeland is a 53 y.o. male here for evaluation for fever and chest pain  Patient reports he was hospitalized at [facility] about a week and a half ago, he left there and was on antibiotics they are being treated for a infection.  He reports he had 3- coronavirus test there.  He left the hospital with some shortness of breath and left-sided chest pain, these have persisted and seem to be worsening.  He is not on an antibiotic but was told that they might put him on one point left but they did not end up putting him on 1.  Reports shortness of breath, fevers chills nonproductive cough or worsening.  No wheezing.  No history of COPD.  Does have diabetes and hypertension.  And a remote history of liver cancer with liver resection.  Currently reports a sharp pain in the left chest worse with inspiration.  Has not taken any antipyretics yet.   Past Medical History:  Diagnosis Date  . Arthritis   . Cancer ([place])   . Diabetes mellitus without complication ([place])   . Hypertension     There are no active problems to display for this patient.   Past Surgical History:  Procedure Laterality Date  . HERNIA REPAIR    . LIVER LOBECTOMY      Prior to Admission medications   Not on File    Allergies Metformin and related and Motrin [ibuprofen]  History reviewed. No pertinent family history.  Social History Social History   Tobacco Use  . Smoking status: Former Research scientist (life sciences)  . Smokeless tobacco: Never Used  Substance Use Topics  . Alcohol use: Not Currently  . Drug use: Yes    Frequency: 0.5 times per week    Types: Marijuana    Review of  Systems Constitutional: Fevers and chills eyes: No visual changes. ENT: No sore throat. Cardiovascular: Sharp pain over his left chest when he takes deep breaths and coughs respiratory: Moderate sense of shortness of breath especially when he is up and walking, just slight at baseline.  No wheezing.  Gastrointestinal: No abdominal pain.   Genitourinary: Negative for dysuria. Musculoskeletal: Negative for back pain. Skin: Negative for rash. Neurological: Negative for headaches, areas of focal weakness or numbness.    ____________________________________________   PHYSICAL EXAM:  VITAL SIGNS: ED Triage Vitals  Enc Vitals Group     BP 04/02/19 0913 124/85     Pulse Rate 04/02/19 0854 (!) 116     Resp 04/02/19 0854 18     Temp 04/02/19 0854 (!) 101.2 F (38.4 C)     Temp Source 04/02/19 0854 Oral     SpO2 04/02/19 0854 98 %     Weight 04/02/19 0856 180 lb (81.6 kg)     Height 04/02/19 0856 5\' 11"  (1.803 m)     Head Circumference --      Peak Flow --      Pain Score 04/02/19 0856 10     Pain Loc --      Pain Edu? --      Excl. in [place]? --     Constitutional: Alert and oriented.  He is ill-appearing, not  in any acute distress but does appear generally ill.  Slightly pale. Eyes: Conjunctivae are normal. Head: Atraumatic. Nose: No congestion/rhinnorhea. Mouth/Throat: Mucous membranes are moist. Neck: No stridor.  Cardiovascular: Tachycardic rate, regular rhythm. Grossly normal heart sounds.  Good peripheral circulation. Respiratory: Normal rate.  No retractions.  Does splint when taking deep respirations reporting pain over the left rib cage.  No wheezing.  No rales but seems diminished over the left lower whether this is due to his splinting or not is hard to tell. Gastrointestinal: Soft and nontender. No distention. Musculoskeletal: No lower extremity tenderness nor edema. Neurologic:  Normal speech and language. No gross focal neurologic deficits are appreciated.  Skin:  Skin  is warm, dry and intact. No rash noted. Psychiatric: Mood and affect are normal. Speech and behavior are normal.  ____________________________________________   LABS (all labs ordered are listed, but only abnormal results are displayed)  Labs Reviewed  CBC WITH DIFFERENTIAL/PLATELET - Abnormal; Notable for the following components:      Result Value   RBC 3.55 (*)    Hemoglobin 10.0 (*)    HCT 31.0 (*)    All other components within normal limits  COMPREHENSIVE METABOLIC PANEL - Abnormal; Notable for the following components:   Sodium 133 (*)    Glucose, Bld 205 (*)    Calcium 8.4 (*)    Albumin 3.2 (*)    All other components within normal limits  SARS CORONAVIRUS 2 (HOSPITAL ORDER, [employer] [facility])  CULTURE, BLOOD (ROUTINE X 2)  CULTURE, BLOOD (ROUTINE X 2)  TROPONIN I  BRAIN NATRIURETIC PEPTIDE  LACTIC ACID, PLASMA  LIPASE, BLOOD  PROTIME-INR  LACTIC ACID, PLASMA  URINALYSIS, ROUTINE W REFLEX MICROSCOPIC   ____________________________________________  EKG  Reviewed entered by me at 850 Heart rate 115 QRS 90 QTc 480 Sinus tachycardia, no evidence of acute ischemia is been noted, however slight left axis ____________________________________________  RADIOLOGY  Ct Angio Chest Pe W And/or Wo Contrast  Result Date: 04/02/2019 CLINICAL DATA:  Shortness of breath, chest pain. EXAM: CT ANGIOGRAPHY CHEST WITH CONTRAST TECHNIQUE: Multidetector CT imaging of the chest was performed using the standard protocol during bolus administration of intravenous contrast. Multiplanar CT image reconstructions and MIPs were obtained to evaluate the vascular anatomy. CONTRAST:  11mL OMNIPAQUE IOHEXOL 350 MG/ML SOLN COMPARISON:  Radiograph of same day. FINDINGS: Cardiovascular: Satisfactory opacification of the pulmonary arteries to the segmental level. No evidence of pulmonary embolism. Normal heart size. Mild to moderate size pericardial effusion is noted. There is no  evidence of thoracic aortic dissection or aneurysm. Coronary artery calcifications are noted. Mediastinum/Nodes: No enlarged mediastinal, hilar, or axillary lymph nodes. Thyroid gland, trachea, and esophagus demonstrate no significant findings. Lungs/Pleura: No pneumothorax is noted. Minimal pleural effusions are noted with adjacent subsegmental atelectasis. Upper Abdomen: No acute abnormality. Musculoskeletal: No chest wall abnormality. No acute or significant osseous findings. Review of the MIP images confirms the above findings. IMPRESSION: No definite evidence of pulmonary embolus. Mild to moderate size pericardial effusion is noted. Minimal bilateral pleural effusions are noted with adjacent subsegmental atelectasis. Electronically Signed   By: Marijo Conception M.D.   On: 04/02/2019 10:28   Dg Chest Port 1 View  Result Date: 04/02/2019 CLINICAL DATA:  Acute shortness of breath and chest pain for several weeks. EXAM: PORTABLE CHEST 1 VIEW COMPARISON:  None. FINDINGS: UPPER limits normal heart size noted. This is a low volume film with mild bibasilar atelectasis. There may be trace bilateral  pleural effusions present. No definite airspace disease or pneumothorax. No acute bony abnormalities. IMPRESSION: Low volume film with mild bibasilar atelectasis. UPPER limits normal heart size with possible trace bilateral pleural effusions. Electronically Signed   By: Margarette Canada M.D.   On: 04/02/2019 09:41      Imaging reviewed, discussed with cardiology Dr. Ubaldo Glassing.  Most notable for pericardial effusion, mild to moderate.  Of note the patient does not have any tension or tamponade physiology ____________________________________________   PROCEDURES  Procedure(s) performed: None  Procedures  Critical Care performed: Yes, see critical care note(s)  CRITICAL CARE Performed by: Delman Kitten   Total critical care time: 30 minutes  Critical care time was exclusive of separately billable procedures and  treating other patients.  Critical care was necessary to treat or prevent imminent or life-threatening deterioration.  Critical care was time spent personally by me on the following activities: development of treatment plan with patient and/or surrogate as well as nursing, discussions with consultants, evaluation of patient's response to treatment, examination of patient, obtaining history from patient or surrogate, ordering and performing treatments and interventions, ordering and review of laboratory studies, ordering and review of radiographic studies, pulse oximetry and re-evaluation of patient's condition.  ____________________________________________   INITIAL IMPRESSION / ASSESSMENT AND PLAN / ED COURSE  Pertinent labs & imaging results that were available during my care of the patient were reviewed by me and considered in my medical decision making (see chart for details).   Hodari Chuba was evaluated in Emergency Department on 04/02/2019 for the symptoms described in the history of present illness. He was evaluated in the context of the global COVID-19 pandemic, which necessitated consideration that the patient might be at risk for infection with the SARS-CoV-2 virus that causes COVID-19. Institutional protocols and algorithms that pertain to the evaluation of patients at risk for COVID-19 are in a state of rapid change based on information released by regulatory bodies including the CDC and federal and state organizations. These policies and algorithms were followed during the patient's care in the ED.   Patiently certainly seems like he could be at risk for COVID.  COVID screen is sent.  However with his fever pleuritic pain and recent hospitalization also very high differential would be pleurisy, pneumonia, pleurisy, pericardial etiology, musculoskeletal, also consideration would be pulmonary embolism and ACS however he is notably febrile with a history of a worsening fevers and  symptomatology which to me would favor an infectious cause.  Patient meets sepsis criteria.  Broad-spectrum antibiotics and initial focus on pulmonary possibly healthcare associated antibiotics initiated promptly.  He does not meet criteria for large-volume fluid resuscitation at this time, lactate is pending  Clinical Course as of Apr 02 1219  Mon Apr 02, 2019  1143 Cardiology has been paged regarding pericardial effusion the patient's presentation.   [MQ]  1150 Case and care discussed with Dr. Ubaldo Glassing. Recommend admission and will have cardiology consult.    [MQ]    Clinical Course User Index [MQ] Delman Kitten, MD   ----------------------------------------- 12:21 PM on 04/02/2019 -----------------------------------------  Admitting to hospitalist service, cardiology consulting for further work-up on the patient's pain and pleuritic chest pain now associated with pericardial effusion.  Discussed with cardiology, patient does not have indication for drainage at this time.  I would be in agreement.  Patient agreeable with plan for admission.  His pain is improving after pain medication.  Reassessment completed, improving, heart rate improving, pain improving.  ____________________________________________   FINAL CLINICAL  IMPRESSION(S) / ED DIAGNOSES  Final diagnoses:  Pericardial effusion  Sepsis, due to unspecified organism, unspecified whether acute organ dysfunction present [facility])        Note:  This document was prepared using Dragon voice recognition software and may include unintentional dictation errors       Delman Kitten, MD 04/02/19 1221

## 2019-04-02 NOTE — ED Notes (Signed)
Pt assisted bed side to use urinal. C/o increasing abd pain in right mid quadrant. Urine dark amber/clear. Will address pain with MD.
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
## 2019-04-02 NOTE — Progress Notes (Signed)
Pharmacy Antibiotic Note  Kenneth Copeland is a 53 y.o. male admitted on 04/02/2019 with sepsis.  Pharmacy has been consulted for Vancomycin and Cefepime dosing.  Plan: Vancomycin 1250 mg IV Q 12 hrs. Goal AUC 400-550. Expected AUC: 466.6 SCr used: 0.9  Cefepime 2g IV q8h   Height: 5\' 11"  (180.3 cm) Weight: 180 lb (81.6 kg) IBW/kg (Calculated) : 75.3  Temp (24hrs), Avg:100.1 F (37.8 C), Min:99 F (37.2 C), Max:101.2 F (38.4 C)  Recent Labs  Lab 04/02/19 0909  WBC 8.6  CREATININE 0.90  LATICACIDVEN 1.8    Estimated Creatinine Clearance: 104.6 mL/min (by C-G formula based on SCr of 0.9 mg/dL).    Allergies  Allergen Reactions  . Metformin And Related     Pt. States it makes his stomach bleed because of a stomach ulcer  . Motrin [Ibuprofen]     Pt. States it "inflames his stomach, causes bleeding in stomach"    Antimicrobials this admission: Vancomycin 5/11 >>  Cefepime 5/11 >>   Thank you for allowing pharmacy to be a part of this patient's care.  Paulina Fusi, PharmD, BCPS 04/02/2019 5:01 PM

## 2019-04-03 ENCOUNTER — Inpatient Hospital Stay
Admit: 2019-04-03 | Discharge: 2019-04-03 | Disposition: A | Discharge status: Home / Self Care | Payer: Medicare (Managed Care) | Attending: Nurse Practitioner | Admitting: Nurse Practitioner

## 2019-04-03 ENCOUNTER — Inpatient Hospital Stay: Payer: Medicare (Managed Care)

## 2019-04-03 LAB — COMPREHENSIVE METABOLIC PANEL
ALT: 34 U/L (ref 0–44)
AST: 29 U/L (ref 15–41)
Albumin: 3.1 g/dL — ABNORMAL LOW (ref 3.5–5.0)
Alkaline Phosphatase: 115 U/L (ref 38–126)
Anion gap: 9 (ref 5–15)
BUN: 12 mg/dL (ref 6–20)
CO2: 24 mmol/L (ref 22–32)
Calcium: 8.2 mg/dL — ABNORMAL LOW (ref 8.9–10.3)
Chloride: 97 mmol/L — ABNORMAL LOW (ref 98–111)
Creatinine, Ser: 0.59 mg/dL — ABNORMAL LOW (ref 0.61–1.24)
GFR calc Af Amer: >60 mL/min (ref >60)
GFR calc non Af Amer: >60 mL/min (ref >60)
Glucose, Bld: 293 mg/dL — ABNORMAL HIGH (ref 70–99)
Potassium: 4 mmol/L (ref 3.5–5.1)
Sodium: 130 mmol/L — ABNORMAL LOW (ref 135–145)
Total Bilirubin: 0.7 mg/dL (ref 0.3–1.2)
Total Protein: 7 g/dL (ref 6.5–8.1)

## 2019-04-03 LAB — GLUCOSE, CAPILLARY
Glucose-Capillary: 254 mg/dL — ABNORMAL HIGH (ref 70–99)
Glucose-Capillary: 364 mg/dL — ABNORMAL HIGH (ref 70–99)
Glucose-Capillary: 296 mg/dL — ABNORMAL HIGH (ref 70–99)
Glucose-Capillary: 340 mg/dL — ABNORMAL HIGH (ref 70–99)
Glucose-Capillary: 283 mg/dL — ABNORMAL HIGH (ref 70–99)

## 2019-04-03 LAB — ECHOCARDIOGRAM COMPLETE
Height: 71 in
Weight: 2891.2 oz

## 2019-04-03 LAB — CBC
HCT: 26.7 % — ABNORMAL LOW (ref 39.0–52.0)
Hemoglobin: 8.6 g/dL — ABNORMAL LOW (ref 13.0–17.0)
MCH: 28.3 pg (ref 26.0–34.0)
MCHC: 32.2 g/dL (ref 30.0–36.0)
MCV: 87.8 fL (ref 80.0–100.0)
Platelets: 227 10*3/uL (ref 150–400)
RBC: 3.04 MIL/uL — ABNORMAL LOW (ref 4.22–5.81)
RDW: 13.9 % (ref 11.5–15.5)
WBC: 7.2 10*3/uL (ref 4.0–10.5)
nRBC: 0 % (ref 0.0–0.2)

## 2019-04-03 LAB — TROPONIN I: Troponin I: <0.03 ng/mL (ref <0.03)

## 2019-04-03 LAB — CORTISOL-AM, BLOOD: Cortisol - AM: 9.7 ug/dL (ref 6.7–22.6)

## 2019-04-03 LAB — PROCALCITONIN: Procalcitonin: <0.1 ng/mL

## 2019-04-03 MED ORDER — GABAPENTIN 100 MG PO CAPS
100.0000 mg | ORAL_CAPSULE | Freq: Two times a day (BID) | ORAL | Status: DC
  Administered 2019-04-03: 100 mg via ORAL
  Filled 2019-04-03: qty 1

## 2019-04-03 MED ORDER — OXYCODONE-ACETAMINOPHEN 7.5-325 MG PO TABS
1.0000 | ORAL_TABLET | Freq: Once | ORAL | Status: AC
  Administered 2019-04-03: 1 via ORAL
  Filled 2019-04-03: qty 1

## 2019-04-03 MED ORDER — COLCHICINE 0.6 MG PO TABS
0.6000 mg | ORAL_TABLET | Freq: Two times a day (BID) | ORAL | Status: DC
  Administered 2019-04-03 – 2019-04-05 (×5): 0.6 mg via ORAL
  Filled 2019-04-03 (×5): qty 1

## 2019-04-03 MED ORDER — SODIUM CHLORIDE 0.9 % IV SOLN: INTRAVENOUS | Status: DC

## 2019-04-03 MED ORDER — OXYCODONE-ACETAMINOPHEN 7.5-325 MG PO TABS
2.0000 | ORAL_TABLET | Freq: Four times a day (QID) | ORAL | Status: DC | PRN
  Administered 2019-04-03 – 2019-04-05 (×6): 2 via ORAL
  Filled 2019-04-03 (×6): qty 2

## 2019-04-03 MED ORDER — LISINOPRIL 5 MG PO TABS
5.0000 mg | ORAL_TABLET | Freq: Every day | ORAL | Status: DC
  Administered 2019-04-03 – 2019-04-05 (×3): 5 mg via ORAL
  Filled 2019-04-03 (×3): qty 1

## 2019-04-03 NOTE — Progress Notes (Signed)
Patient is sleeping in between conversation. Patient is spending a significant amount of time in the bathroom but refuses to uses the urinal. Patient is continues to request pain medication after receiving iv morphine. Discussed concerns with Dr.Mansy to discontinue morphine. New orders given.

## 2019-04-03 NOTE — Plan of Care (Signed)
  Problem: Education: Goal: Knowledge of General Education information will improve Description Including pain rating scale, medication(s)/side effects and non-pharmacologic comfort measures Outcome: Not Progressing   Problem: Health Behavior/Discharge Planning: Goal: Ability to manage health-related needs will improve Outcome: Not Progressing Note:  Glucose levels are elevated, no treatment given since admission. Patient is currently requesting snacks and juice. Continue to educate patient about diabetic diet.

## 2019-04-03 NOTE — Progress Notes (Signed)
Inpatient Diabetes Program Recommendations  AACE/ADA: New Consensus Statement on Inpatient Glycemic Control  Target Ranges:  Prepandial:   less than 140 mg/dL      Peak postprandial:   less than 180 mg/dL (1-2 hours)      Critically ill patients:  140 - 180 mg/dL  Results for Kenneth Copeland, Kenneth Copeland (MRN [id number]) as of 04/03/2019 07:51  Ref. Range 04/02/2019 09:09 04/03/2019 05:17  Glucose Latest Ref Range: 70 - 99 mg/dL 205 (H) 293 (H)   Results for Kenneth Copeland, Kenneth Copeland (MRN [id number]) as of 04/03/2019 07:51  Ref. Range 04/02/2019 21:06  Glucose-Capillary Latest Ref Range: 70 - 99 mg/dL 452 (H)   Review of Glycemic Control  Outpatient Diabetes medications: Lantus 40 units BID, Humalog 10 units TID with meals plus additional units for correction when needed Current orders for Inpatient glycemic control: Lantus 40 units BID, Novolog 0-9 units TID with meals, Novolog 0-5 units QHS   NOTE: Noted consult for Diabetes Coordinator for hyperglycemia. Chart reviewed. Noted patient was admitted at [facility] from 03/13/19 to 03/19/19 and was discharged on 03/19/19 on Lantus 46 units daily and Novolog 12 units TID with meals. Spoke with patient over the phone and he reports that he is taking Lantus 40 units BID and Humalog 10 units TID with meals plus additional units for correction 1-2 times per day. Patient reports that he has been taking this insulin regimen and not the one he was discharged with from [facility]. Patient reports that while he was an inpatient at [facility] his glucose was consistently high. However, he reports that with the current insulin regimen he is taking his glucose is usually in the mid to upper 100's mg/dl.  Discussed current insulin orders and patient states that he has a good appetite and is getting ready to eat.  Informed patient that glucose trends would be followed and recommendations would be made if needed. Patient verbalized understanding of information discussed. Noted  patient received Lantus 40 units last night and fasting glucose is 293 mg/dl this morning.  Will continue to follow along and make recommendations for insulin changes if needed.  Thanks, Barnie Alderman, RN, MSN, CDE Diabetes Coordinator Inpatient Diabetes Program [phone] (Team Pager from 8am to 5pm)

## 2019-04-03 NOTE — TOC Initial Note (Signed)
Transition of Care [facility]) - Initial/Assessment Note    Patient Details  Name: Kenneth Copeland MRN: [id number] Date of Birth: 07/04/1966  [facility] CM/SW Contact:    Elza Rafter, RN Phone Number: 04/03/2019, 4:10 PM  Clinical Narrative:     Patient is independent from home with girlfriend.  He recently moved from [place].  He has Medicare & [employer] but needs to switch to a [place] plan.  Financial counceling has spoke with him and is working on this with him.  He and his girlfriend are staying at the [facility] while they look for an apartment to rent.  No active insurance or PCP.  Will assist with medications through medication management.  Updated TOC medication management spread sheet.  List of local PCP's provided.  No DME needs; currently on room air.  No transportation needs.                Expected Discharge Plan: Home/Self Care Barriers to Discharge: Continued Medical Work up   Patient Goals and CMS Choice        Expected Discharge Plan and Services Expected Discharge Plan: Home/Self Care   Discharge Planning Services: CM Consult, Medication Assistance   Living arrangements for the past 2 months: ([employer])                                      Prior Living Arrangements/Services Living arrangements for the past 2 months: ([employer]) Lives with:: Spouse Patient language and need for interpreter reviewed:: Yes Do you feel safe going back to the place where you live?: Yes            Criminal Activity/Legal Involvement Pertinent to Current Situation/Hospitalization: No - Comment as needed  Activities of Daily Living Home Assistive Devices/Equipment: None ADL Screening (condition at time of admission) Patient's cognitive ability adequate to safely complete daily activities?: Yes Is the patient deaf or have difficulty hearing?: No Does the patient have difficulty seeing, even when wearing glasses/contacts?: No Does the  patient have difficulty concentrating, remembering, or making decisions?: No Patient able to express need for assistance with ADLs?: No Does the patient have difficulty dressing or bathing?: No Independently performs ADLs?: No Does the patient have difficulty walking or climbing stairs?: No Weakness of Legs: None Weakness of Arms/Hands: None  Permission Sought/Granted                  Emotional Assessment Appearance:: Appears stated age Attitude/Demeanor/Rapport: Gracious Affect (typically observed): Accepting Orientation: : Oriented to Self, Oriented to Place, Oriented to  Time, Oriented to Situation Alcohol / Substance Use: Not Applicable    Admission diagnosis:  Pericardial effusion [I31.3] Sepsis, due to unspecified organism, unspecified whether acute organ dysfunction present [facility]) [A41.9] Patient Active Problem List   Diagnosis Date Noted  . Pericardial effusion 04/02/2019   PCP:  Patient, No Pcp Per Pharmacy:   [facility] [address] [place] - [place] [address] Phone: [phone] Fax: [phone]     Social Determinants of Health (SDOH) Interventions    Readmission Risk Interventions No flowsheet data found.

## 2019-04-03 NOTE — Progress Notes (Signed)
Echo revealed small pericardial effusion with no evidence of hemodynamic compromise.  I placed on colchicine.  Would continue with antibiotics and anti-inflammatories.  Avoiding substance abuse.

## 2019-04-03 NOTE — Progress Notes (Addendum)
[facility] [place] NAME: Kenneth Copeland    MR#:  [id number]  DATE OF BIRTH:  Jun 03, 1966  SUBJECTIVE:   Chief Complaint  Patient presents with  . Shortness of Breath  . Chest Pain   Patient continues to complain of pain in his left subscapular region rating 10 out of 10 on a pain intensity rating scale.  Patient reports that nothing seems to make the pain better, moving coughing and deep breathing makes pain worse.  REVIEW OF SYSTEMS:   Constitutional: Positive for chills and fever. Negative for diaphoresis, malaise/fatigue and weight loss.  HENT: Negative for congestion, hearing loss, sore throat and tinnitus.   Eyes: Negative for blurred vision, double vision and photophobia.  Respiratory: Positive for shortness of breath. Negative for cough, hemoptysis, sputum production and wheezing.   Cardiovascular: Positive for chest pain. Negative for palpitations, orthopnea, claudication and leg swelling.  Gastrointestinal: Positive for heartburn. Negative for abdominal pain, diarrhea, nausea and vomiting.  Genitourinary: Negative for dysuria, flank pain, frequency, hematuria and urgency.  Musculoskeletal: Negative for myalgias.  Skin: Positive for itching. Negative for rash.  Neurological: Negative for dizziness, sensory change, speech change, focal weakness, seizures, weakness and headaches.  Psychiatric/Behavioral: Positive for depression and substance abuse. Negative for suicidal ideas.  DRUG ALLERGIES:   Allergies  Allergen Reactions  . Metformin And Related     Pt. States it makes his stomach bleed because of a stomach ulcer  . Motrin [Ibuprofen]     Pt. States it "inflames his stomach, causes bleeding in stomach"   VITALS:  Blood pressure 135/79, pulse (!) 109, temperature 98.7 F (37.1 C), resp. rate 20, height 5\' 11"  (1.803 m), weight 82 kg, SpO2 97 %. PHYSICAL EXAMINATION:   GENERAL:  53 y.o.-year-old patient lying in the bed  with no acute distress.  EYES: Pupils equal, round, reactive to light and accommodation. No scleral icterus. Extraocular muscles intact.  HEENT: Head atraumatic, normocephalic. Oropharynx and nasopharynx clear.  NECK:  Supple, no jugular venous distention. No thyroid enlargement, no tenderness.  LUNGS: Normal breath sounds bilaterally, no wheezing, rales,rhonchi or crepitation. No use of accessory muscles of respiration.  CARDIOVASCULAR: S1, S2 normal. No murmurs, rubs, or gallops.  ABDOMEN: Soft, nontender, nondistended. Bowel sounds present. No organomegaly or mass.  EXTREMITIES: No pedal edema, cyanosis, or clubbing.  MUSCULOSKELETAL: Tenderness in bilateral subscapular region left>right. Decreased ROM in the left arm due to painNEUROLOGIC: Cranial nerves II through XII are intact. Muscle strength 5/5 in all extremities. Sensation intact. Gait not checked.  PSYCHIATRIC: The patient is alert and oriented x 3.  SKIN: No obvious rash, lesion, or ulcer.   DATA REVIEWED:  LABORATORY PANEL:  Male CBC Recent Labs  Lab 04/03/19 0517  WBC 7.2  HGB 8.6*  HCT 26.7*  PLT 227   ------------------------------------------------------------------------------------------------------------------ Chemistries  Recent Labs  Lab 04/03/19 0517  NA 130*  K 4.0  CL 97*  CO2 24  GLUCOSE 293*  BUN 12  CREATININE 0.59*  CALCIUM 8.2*  AST 29  ALT 34  ALKPHOS 115  BILITOT 0.7   RADIOLOGY:  Ct Angio Chest Pe W And/or Wo Contrast  Result Date: 04/02/2019 CLINICAL DATA:  Shortness of breath, chest pain. EXAM: CT ANGIOGRAPHY CHEST WITH CONTRAST TECHNIQUE: Multidetector CT imaging of the chest was performed using the standard protocol during bolus administration of intravenous contrast. Multiplanar CT image reconstructions and MIPs were obtained to evaluate the vascular anatomy. CONTRAST:  81mL OMNIPAQUE IOHEXOL  350 MG/ML SOLN COMPARISON:  Radiograph of same day. FINDINGS: Cardiovascular: Satisfactory  opacification of the pulmonary arteries to the segmental level. No evidence of pulmonary embolism. Normal heart size. Mild to moderate size pericardial effusion is noted. There is no evidence of thoracic aortic dissection or aneurysm. Coronary artery calcifications are noted. Mediastinum/Nodes: No enlarged mediastinal, hilar, or axillary lymph nodes. Thyroid gland, trachea, and esophagus demonstrate no significant findings. Lungs/Pleura: No pneumothorax is noted. Minimal pleural effusions are noted with adjacent subsegmental atelectasis. Upper Abdomen: No acute abnormality. Musculoskeletal: No chest wall abnormality. No acute or significant osseous findings. Review of the MIP images confirms the above findings. IMPRESSION: No definite evidence of pulmonary embolus. Mild to moderate size pericardial effusion is noted. Minimal bilateral pleural effusions are noted with adjacent subsegmental atelectasis. Electronically Signed   By: Marijo Conception M.D.   On: 04/02/2019 10:28   Mr Cervical Spine Wo Contrast  Result Date: 04/03/2019 CLINICAL DATA:  Shoulder, neck, and chest pain EXAM: MRI CERVICAL SPINE WITHOUT CONTRAST TECHNIQUE: Multiplanar, multisequence MR imaging of the cervical spine was performed. No intravenous contrast was administered. COMPARISON:  None. FINDINGS: Alignment: Loss of the normal cervical lordosis, which can be associated with muscle spasm. Vertebrae: Degenerative facet arthropathy on the left at C2-3 with associated facet edema. Cord: No significant abnormal spinal cord signal is observed. Posterior Fossa, vertebral arteries, paraspinal tissues: Unremarkable Disc levels: C2-3: Mild left foraminal impingement due to uncinate spurring and facet arthropathy. C3-4: No impingement. Small central disc protrusion. Uncinate spurring. C4-5: Mild right foraminal stenosis due to uncinate spurring. C5-6: Moderate to prominent right foraminal stenosis and mild to moderate right eccentric central narrowing  of the thecal sac due to right paracentral disc osteophyte complex, disc bulge, and uncinate spurring. C6-7: No impingement.  Right paracentral disc protrusion. C7-T1: Unremarkable. IMPRESSION: 1. Cervical spondylosis and degenerative disc disease causing moderate to prominent impingement at C5-6; and mild impingement at C2-3 and C4-5, as detailed above. Electronically Signed   By: Van Clines M.D.   On: 04/03/2019 13:45   Dg Chest Port 1 View  Result Date: 04/02/2019 CLINICAL DATA:  Acute shortness of breath and chest pain for several weeks. EXAM: PORTABLE CHEST 1 VIEW COMPARISON:  None. FINDINGS: UPPER limits normal heart size noted. This is a low volume film with mild bibasilar atelectasis. There may be trace bilateral pleural effusions present. No definite airspace disease or pneumothorax. No acute bony abnormalities. IMPRESSION: Low volume film with mild bibasilar atelectasis. UPPER limits normal heart size with possible trace bilateral pleural effusions. Electronically Signed   By: Margarette Canada M.D.   On: 04/02/2019 09:41   ASSESSMENT AND PLAN:   53 y.o. 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  1. Sepsis - Unknown etiology.  Patient presenting with a fever of 101.2, tachycardia, complaints of fevers and chills also with recent admission at [facility] for prostatitis on 4/21 treated with Vanc/Cefepime/Flagyl which was switched to Bactrim. - Continue empiric antibiotics cefepime and vancomycin - Blood cultures pending -  Procalcitonin pending - Gentle IVFs in the setting of pericardial effusion with minimal pleural effusion - Repeats labs in am  2.  Pleuritic chest pain - On  review of his chart from [facility] he has hx of erosive esophagitis - Echocardiogram shows circumferential pericardial effusion with no evidence of cardiac tamponade - PRN pain management - MR cervical spine obtained to rule out radiculitis shows moderate to prominent impingement at C5-6 and mild impingement at C2-3 and C4-5 - Start low-dose gabapentin 100 mg BID - Continue Colchicine per cardiology recomendation - [place] cardiology input  3. Insulin-dependent Diabetes Mellitus -poorly controlled with refractory Gastroparesis - HbA1c 12.8% on 03/13/2019  - Continue sliding scale coverage and Lantus - Diabetes coordinator folowing  4. HTN - Blood pressure stable - Restart his home Lisinopril  5.  Erosive esophagitis -EGD 08/2018 revealed erosive esophagitis, he has been on sucralfate and protonix - Continue home dose sucralfate 1g QID before meals and nightly - Continue Protonix 40 mg twice daily  6. 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  7. DVT prophylaxis - Lovenox     All the records are reviewed and case discussed with Care Management/Social Worker. Management plans discussed with the patient, family and they are in agreement.  CODE STATUS: Full Code  TOTAL TIME TAKING CARE OF THIS PATIENT: 39 minutes.   More than 50% of the time was spent in counseling/coordination of care: YES  POSSIBLE D/C IN 1 DAYS, DEPENDING ON CLINICAL CONDITION.   04/03/2019 at 3:38 PM  This patient was staffed with Dr. Valetta Fuller, [facility] who personally evaluated patient, reviewed documentation and agreed with assessment and plan of care as above.  Rufina Falco, DNP, FNP-BC [facility] Nurse Practitioner   Between 7am to 6pm - Pager [phone]  After 6pm go to [email or web address] - Proofreader  [facility]  Office  [phone]  CC: Primary care physician; Patient, No Pcp Per  Note: This dictation was prepared with Dragon dictation along with smaller phrase technology. Any transcriptional errors that result from this process are unintentional.

## 2019-04-03 NOTE — Progress Notes (Signed)
*  PRELIMINARY RESULTS* Echocardiogram 2D Echocardiogram has been performed.  Kenneth Copeland 04/03/2019, 8:51 AM

## 2019-04-04 LAB — BASIC METABOLIC PANEL
Anion gap: 12 (ref 5–15)
BUN: 12 mg/dL (ref 6–20)
CO2: 22 mmol/L (ref 22–32)
Calcium: 8.8 mg/dL — ABNORMAL LOW (ref 8.9–10.3)
Chloride: 99 mmol/L (ref 98–111)
Creatinine, Ser: 0.64 mg/dL (ref 0.61–1.24)
GFR calc Af Amer: >60 mL/min (ref >60)
GFR calc non Af Amer: >60 mL/min (ref >60)
Glucose, Bld: 263 mg/dL — ABNORMAL HIGH (ref 70–99)
Potassium: 3.9 mmol/L (ref 3.5–5.1)
Sodium: 133 mmol/L — ABNORMAL LOW (ref 135–145)

## 2019-04-04 LAB — GLUCOSE, CAPILLARY
Glucose-Capillary: 254 mg/dL — ABNORMAL HIGH (ref 70–99)
Glucose-Capillary: 318 mg/dL — ABNORMAL HIGH (ref 70–99)
Glucose-Capillary: 137 mg/dL — ABNORMAL HIGH (ref 70–99)
Glucose-Capillary: 105 mg/dL — ABNORMAL HIGH (ref 70–99)

## 2019-04-04 LAB — CBC
HCT: 27.8 % — ABNORMAL LOW (ref 39.0–52.0)
Hemoglobin: 8.9 g/dL — ABNORMAL LOW (ref 13.0–17.0)
MCH: 28.3 pg (ref 26.0–34.0)
MCHC: 32 g/dL (ref 30.0–36.0)
MCV: 88.5 fL (ref 80.0–100.0)
Platelets: 251 10*3/uL (ref 150–400)
RBC: 3.14 MIL/uL — ABNORMAL LOW (ref 4.22–5.81)
RDW: 14.3 % (ref 11.5–15.5)
WBC: 6.8 10*3/uL (ref 4.0–10.5)
nRBC: 0 % (ref 0.0–0.2)

## 2019-04-04 LAB — TSH: TSH: 1.428 u[IU]/mL (ref 0.350–4.500)

## 2019-04-04 LAB — PROCALCITONIN: Procalcitonin: <0.1 ng/mL

## 2019-04-04 MED ORDER — VANCOMYCIN HCL 1.25 G IV SOLR
1250.0000 mg | Freq: Three times a day (TID) | INTRAVENOUS | Status: DC
  Filled 2019-04-04 (×2): qty 1250

## 2019-04-04 MED ORDER — INSULIN ASPART 100 UNIT/ML ~~LOC~~ SOLN
10.0000 [IU] | Freq: Three times a day (TID) | SUBCUTANEOUS | Status: DC
  Administered 2019-04-04 – 2019-04-05 (×4): 10 [IU] via SUBCUTANEOUS
  Filled 2019-04-04 (×4): qty 1

## 2019-04-04 MED ORDER — INSULIN ASPART 100 UNIT/ML ~~LOC~~ SOLN: 0.0000 [IU] | Freq: Every day | SUBCUTANEOUS | Status: DC

## 2019-04-04 MED ORDER — VANCOMYCIN HCL 1.5 G IV SOLR
1500.0000 mg | Freq: Two times a day (BID) | INTRAVENOUS | Status: DC
  Filled 2019-04-04: qty 1500

## 2019-04-04 MED ORDER — INSULIN GLARGINE 100 UNIT/ML ~~LOC~~ SOLN
45.0000 [IU] | Freq: Two times a day (BID) | SUBCUTANEOUS | Status: DC
  Administered 2019-04-04 – 2019-04-05 (×2): 45 [IU] via SUBCUTANEOUS
  Filled 2019-04-04 (×5): qty 0.45

## 2019-04-04 MED ORDER — INSULIN ASPART 100 UNIT/ML ~~LOC~~ SOLN
0.0000 [IU] | Freq: Three times a day (TID) | SUBCUTANEOUS | Status: DC
  Administered 2019-04-04: 11 [IU] via SUBCUTANEOUS
  Administered 2019-04-05: 5 [IU] via SUBCUTANEOUS
  Administered 2019-04-05: 11 [IU] via SUBCUTANEOUS
  Filled 2019-04-04 (×3): qty 1

## 2019-04-04 MED ORDER — GABAPENTIN 300 MG PO CAPS
300.0000 mg | ORAL_CAPSULE | Freq: Three times a day (TID) | ORAL | Status: DC
  Administered 2019-04-04 – 2019-04-05 (×4): 300 mg via ORAL
  Filled 2019-04-04 (×4): qty 1

## 2019-04-04 MED ORDER — POLYETHYLENE GLYCOL 3350 17 G PO PACK: 17.0000 g | PACK | Freq: Every day | ORAL | Status: DC | PRN

## 2019-04-04 MED ORDER — SENNOSIDES-DOCUSATE SODIUM 8.6-50 MG PO TABS
2.0000 | ORAL_TABLET | Freq: Two times a day (BID) | ORAL | Status: DC | PRN
  Administered 2019-04-05: 2 via ORAL
  Filled 2019-04-04: qty 2

## 2019-04-04 NOTE — Progress Notes (Addendum)
Pharmacy Antibiotic Note  Kenneth Copeland is a 53 y.o. male admitted on 04/02/2019 with sepsis.  Pharmacy has been consulted for Vancomycin and Cefepime dosing.  Plan: Will increase vancomycin 1250 mg IV Q 8 hrs. Goal AUC 400-550. Expected AUC: 503 SCr used: 0.8  Will continue cefepime 2g IV q8h   Height: 5\' 11"  (180.3 cm) Weight: 211 lb (95.7 kg) IBW/kg (Calculated) : 75.3  Temp (24hrs), Avg:99 F (37.2 C), Min:98.4 F (36.9 C), Max:99.6 F (37.6 C)  Recent Labs  Lab 04/02/19 0909 04/02/19 1714 04/03/19 0517 04/04/19 0347  WBC 8.6  --  7.2 6.8  CREATININE 0.90  --  0.59* 0.64  LATICACIDVEN 1.8 1.9  --   --     Estimated Creatinine Clearance: 130.5 mL/min (by C-G formula based on SCr of 0.64 mg/dL).    Allergies  Allergen Reactions  . Metformin And Related     Pt. States it makes his stomach bleed because of a stomach ulcer  . Motrin [Ibuprofen]     Pt. States it "inflames his stomach, causes bleeding in stomach"    Antimicrobials this admission: Vancomycin 5/11 >>  Cefepime 5/11 >>   Thank you for allowing pharmacy to be a part of this patient's care.  Eleonore Chiquito, PharmD, BCPS 04/04/2019 9:24 AM

## 2019-04-04 NOTE — Progress Notes (Addendum)
[facility] [place] NAME: Kenneth Copeland    MR#:  [id number]  DATE OF BIRTH:  22-Jul-1966  SUBJECTIVE:   Patient continues to complain of left shoulder pain.  He states this is not gotten any better.  He denies any chest pain or shortness of breath.  REVIEW OF SYSTEMS:   Constitutional: Positive for chills and fever. Negative for diaphoresis, malaise/fatigue and weight loss.  HENT: Negative for congestion, hearing loss, sore throat and tinnitus.   Eyes: Negative for blurred vision, double vision and photophobia.  Respiratory: Positive for shortness of breath. Negative for cough, hemoptysis, sputum production and wheezing.   Cardiovascular:  Negative for chest pain, palpitations, orthopnea, claudication and leg swelling.  Gastrointestinal: Positive for heartburn. Negative for abdominal pain, diarrhea, nausea and vomiting.  Genitourinary: Negative for dysuria, flank pain, frequency, hematuria and urgency.  Musculoskeletal: Negative for myalgias. +left shoulder pain Skin: Positive for itching. Negative for rash.  Neurological: Negative for dizziness, sensory change, speech change, focal weakness, seizures, weakness and headaches.  Psychiatric/Behavioral: Positive for depression and substance abuse. Negative for suicidal ideas.  DRUG ALLERGIES:   Allergies  Allergen Reactions  . Metformin And Related     Pt. States it makes his stomach bleed because of a stomach ulcer  . Motrin [Ibuprofen]     Pt. States it "inflames his stomach, causes bleeding in stomach"   VITALS:  Blood pressure (!) 119/94, pulse (!) 113, temperature 99 F (37.2 C), temperature source Oral, resp. rate 20, height 5\' 11"  (1.803 m), weight 95.7 kg, SpO2 93 %. PHYSICAL EXAMINATION:   GENERAL:  53 y.o.-year-old patient lying in the bed with no acute distress.  EYES: Pupils equal, round, reactive to light and accommodation. No scleral icterus. Extraocular muscles intact.   HEENT: Head atraumatic, normocephalic. Oropharynx and nasopharynx clear.  NECK:  Supple, no jugular venous distention. No thyroid enlargement, no tenderness.  LUNGS: Normal breath sounds bilaterally, no wheezing, rales,rhonchi or crepitation. No use of accessory muscles of respiration.  CARDIOVASCULAR: S1, S2 normal. No murmurs, rubs, or gallops.  ABDOMEN: Soft, nontender, nondistended. Bowel sounds present. No organomegaly or mass.  EXTREMITIES: No pedal edema, cyanosis, or clubbing.  MUSCULOSKELETAL: Decreased ROM of the left shoulder secondary to pain. NEUROLOGIC: Cranial nerves II through XII are intact.  Unable to assess muscle strength in the left upper extremity secondary to pain.  5/5 muscle strength in the remaining extremities. Sensation intact. Gait not checked.  PSYCHIATRIC: The patient is alert and oriented x 3.  SKIN: No obvious rash, lesion, or ulcer.   DATA REVIEWED:  LABORATORY PANEL:  Male CBC Recent Labs  Lab 04/04/19 0347  WBC 6.8  HGB 8.9*  HCT 27.8*  PLT 251   ------------------------------------------------------------------------------------------------------------------ Chemistries  Recent Labs  Lab 04/03/19 0517 04/04/19 0347  NA 130* 133*  K 4.0 3.9  CL 97* 99  CO2 24 22  GLUCOSE 293* 263*  BUN 12 12  CREATININE 0.59* 0.64  CALCIUM 8.2* 8.8*  AST 29  --   ALT 34  --   ALKPHOS 115  --   BILITOT 0.7  --    RADIOLOGY:  Mr Cervical Spine Wo Contrast  Result Date: 04/03/2019 CLINICAL DATA:  Shoulder, neck, and chest pain EXAM: MRI CERVICAL SPINE WITHOUT CONTRAST TECHNIQUE: Multiplanar, multisequence MR imaging of the cervical spine was performed. No intravenous contrast was administered. COMPARISON:  None. FINDINGS: Alignment: Loss of the normal cervical lordosis, which can be associated with muscle  spasm. Vertebrae: Degenerative facet arthropathy on the left at C2-3 with associated facet edema. Cord: No significant abnormal spinal cord signal is  observed. Posterior Fossa, vertebral arteries, paraspinal tissues: Unremarkable Disc levels: C2-3: Mild left foraminal impingement due to uncinate spurring and facet arthropathy. C3-4: No impingement. Small central disc protrusion. Uncinate spurring. C4-5: Mild right foraminal stenosis due to uncinate spurring. C5-6: Moderate to prominent right foraminal stenosis and mild to moderate right eccentric central narrowing of the thecal sac due to right paracentral disc osteophyte complex, disc bulge, and uncinate spurring. C6-7: No impingement.  Right paracentral disc protrusion. C7-T1: Unremarkable. IMPRESSION: 1. Cervical spondylosis and degenerative disc disease causing moderate to prominent impingement at C5-6; and mild impingement at C2-3 and C4-5, as detailed above. Electronically Signed   By: Van Clines M.D.   On: 04/03/2019 13:45   ASSESSMENT AND PLAN:   Sepsis - unknown etiology.  Patient presenting with a fever of 101.2 and tachycardia. Sepsis has resolved.  May be secondary to tooth infection as patient has multiple broken teeth or prostatitis (patient recently treated for this but did not complete antibiotic course, denies any urinary symptoms) - UA, lactic acid, procalcitonin, chest x-ray negative - Blood cultures with no growth - Will stop antibiotics today and monitor for fevers over the next 24 hours  Pleuritic chest pain and left shoulder pain- persistent. May be pericarditis vs radicular pain. - Troponins negative x 3 - Echocardiogram shows small pericardial effusion - MRI c-spine with moderate to prominent impingement at C5-6 and mild impingement at C2-3 and C4-5 - Increase gabapentin dose to 300mg  tid - Cardiology consulted- recommended starting colchicine  Sinus tachycardia- patient has been persistently tachycardic, may be secondary to pain. - Check TSH - Cardiac monitoring  Type 2 diabetes -poorly controlled - HbA1c 12.8% on 03/13/2019  - Increase lantus to 45 units  daily, increase SSI to moderate, add 10 units Novolog tid with meals  HTN - Blood pressure stable - Continue home Lisinopril  History of erosive esophagitis- EGD 08/2018 revealed erosive esophagitis, he has been on sucralfate and protonix - Continue home dose sucralfate 1g QID before meals and nightly - Continue Protonix 40 mg twice daily  Hyperlipidemia - Unable to tolerate statins due to statin-induced myopathy.  Polysubstance abuse- long history of this - Counseling provided  DVT prophylaxis - Lovenox   Plan for likely discharge home tomorrow. Awaiting PT evaluation.  All the records are reviewed and case discussed with Care Management/Social Worker. Management plans discussed with the patient, family and they are in agreement.  CODE STATUS: Full Code  TOTAL TIME TAKING CARE OF THIS PATIENT: 40 minutes.   More than 50% of the time was spent in counseling/coordination of care: YES  POSSIBLE D/C TOMORROW, DEPENDING ON CLINICAL CONDITION.   04/04/2019 at 2:15 PM  This patient was staffed with Dr. Valetta Fuller, [facility] who personally evaluated patient, reviewed documentation and agreed with assessment and plan of care as above.  Rufina Falco, DNP, FNP-BC [facility] Nurse Practitioner   Between 7am to 6pm - Pager [phone]  After 6pm go to [email or web address] - Proofreader  [facility]  Office  [phone]  CC: Primary care physician; Patient, No Pcp Per  Note: This dictation was prepared with Dragon dictation along with smaller phrase technology. Any transcriptional errors that result from this process are unintentional.

## 2019-04-04 NOTE — Progress Notes (Addendum)
VAST consulted to place IV. VAST RN noted IV antibiotics dc'd and no IV fluids ordered. Spoke to [employer], MD via secure chat. She confirmed that patient is going home tomorrow and does not need IV access at this time.  Educated unit RN, Bethel Born we are not placing "just in case" IV's for vein preservation and infection risk reduction. Ensured unit RN if there is an emergency, Rapid Response and IVT can place an IV at that time.

## 2019-04-04 NOTE — Progress Notes (Signed)
Inpatient Diabetes Program Recommendations  AACE/ADA: New Consensus Statement on Inpatient Glycemic Control  Target Ranges:  Prepandial:   less than 140 mg/dL      Peak postprandial:   less than 180 mg/dL (1-2 hours)      Critically ill patients:  140 - 180 mg/dL   Results for Kenneth Copeland, Kenneth Copeland (MRN [id number]) as of 04/04/2019 08:05  Ref. Range 04/03/2019 07:50 04/03/2019 11:28 04/03/2019 16:47 04/03/2019 21:37 04/03/2019 23:03 04/04/2019 07:56  Glucose-Capillary Latest Ref Range: 70 - 99 mg/dL 254 (H) 364 (H) 296 (H) 340 (H) 283 (H) 254 (H)   Review of Glycemic Control  Outpatient Diabetes medications: Lantus 40 units BID, Humalog 10 units TID with meals plus additional units for correction when needed Current orders for Inpatient glycemic control: Lantus 40 units BID, Novolog 0-9 units TID with meals, Novolog 0-5 units QHS  Inpatient Diabetes Program Recommendations:    Insulin-Basal: Please consider increasing Lantus to 45 units BID.  Insulin-Meal Coverage: Please consider ordering Novolog 10 units TID with meals if patient eats at least 50% of meals.  Insulin-Correction: Please consider increasing Novolog to Moderate correction scale (Novolog 0-15 units TID).  Thanks, Barnie Alderman, RN, MSN, CDE Diabetes Coordinator Inpatient Diabetes Program [phone] (Team Pager from 8am to 5pm)

## 2019-04-05 LAB — CBC
HCT: 25.6 % — ABNORMAL LOW (ref 39.0–52.0)
Hemoglobin: 8.2 g/dL — ABNORMAL LOW (ref 13.0–17.0)
MCH: 27.8 pg (ref 26.0–34.0)
MCHC: 32 g/dL (ref 30.0–36.0)
MCV: 86.8 fL (ref 80.0–100.0)
Platelets: 233 10*3/uL (ref 150–400)
RBC: 2.95 MIL/uL — ABNORMAL LOW (ref 4.22–5.81)
RDW: 14.5 % (ref 11.5–15.5)
WBC: 7.2 10*3/uL (ref 4.0–10.5)
nRBC: 0 % (ref 0.0–0.2)

## 2019-04-05 LAB — GLUCOSE, CAPILLARY
Glucose-Capillary: 208 mg/dL — ABNORMAL HIGH (ref 70–99)
Glucose-Capillary: 309 mg/dL — ABNORMAL HIGH (ref 70–99)

## 2019-04-05 LAB — BASIC METABOLIC PANEL
Anion gap: 9 (ref 5–15)
BUN: 16 mg/dL (ref 6–20)
CO2: 22 mmol/L (ref 22–32)
Calcium: 8.3 mg/dL — ABNORMAL LOW (ref 8.9–10.3)
Chloride: 99 mmol/L (ref 98–111)
Creatinine, Ser: 0.74 mg/dL (ref 0.61–1.24)
GFR calc Af Amer: >60 mL/min (ref >60)
GFR calc non Af Amer: >60 mL/min (ref >60)
Glucose, Bld: 208 mg/dL — ABNORMAL HIGH (ref 70–99)
Potassium: 3.9 mmol/L (ref 3.5–5.1)
Sodium: 130 mmol/L — ABNORMAL LOW (ref 135–145)

## 2019-04-05 MED ORDER — TAMSULOSIN HCL 0.4 MG PO CAPS: 0.4000 mg | ORAL_CAPSULE | Freq: Every day | ORAL | 3 refills | Status: AC

## 2019-04-05 MED ORDER — INSULIN GLARGINE 100 UNIT/ML ~~LOC~~ SOLN: 40.0000 [IU] | Freq: Every day | SUBCUTANEOUS | Status: DC

## 2019-04-05 MED ORDER — COLCHICINE 0.6 MG PO TABS: 0.6000 mg | ORAL_TABLET | Freq: Two times a day (BID) | ORAL | 0 refills | Status: AC

## 2019-04-05 MED ORDER — GABAPENTIN 300 MG PO CAPS: 300.0000 mg | ORAL_CAPSULE | Freq: Three times a day (TID) | ORAL | 2 refills | Status: AC

## 2019-04-05 MED ORDER — DULOXETINE HCL 30 MG PO CPEP: 30.0000 mg | ORAL_CAPSULE | Freq: Every day | ORAL | 2 refills | Status: AC

## 2019-04-05 MED ORDER — OXYCODONE-ACETAMINOPHEN 7.5-325 MG PO TABS: 2.0000 | ORAL_TABLET | Freq: Four times a day (QID) | ORAL | 0 refills | Status: DC | PRN

## 2019-04-05 NOTE — Progress Notes (Signed)
[facility] Cardiology  SUBJECTIVE: Mr. Kenneth Copeland is a 53 year old male with small pericardial effusion noted on echocardiogram.  Started on colchicine. No evidence of tamponade.  Today, admits to mild chest pressure that improves with leaning forward.  Shortness of breath has improved.  He is sitting up in bed, eating breakfast .   Vitals:   04/04/19 1644 04/04/19 1930 04/05/19 0451 04/05/19 0748  BP: 94/73 108/78 117/85 101/68  Pulse: (!) 117 (!) 118 (!) 117 (!) 106  Resp: 20 20 20 20   Temp: 98.9 F (37.2 C) 99.1 F (37.3 C) 99.9 F (37.7 C) 99 F (37.2 C)  TempSrc: Oral Oral Oral Oral  SpO2: 91% 92% 90% 90%  Weight:   83 kg   Height:         Intake/Output Summary (Last 24 hours) at 04/05/2019 0950 Last data filed at 04/05/2019 3903 Gross per 24 hour  Intake 720 ml  Output 150 ml  Net 570 ml      PHYSICAL EXAM  General: Well developed, well nourished, in no acute distress HEENT:  Normocephalic and atramatic Neck:  No JVD.  Lungs: Clear bilaterally to auscultation and percussion. Heart: HRRR . Normal S1 and S2 without gallops or murmurs.  Abdomen: Bowel sounds are positive, abdomen soft and non-tender  Msk:  Back normal.  Normal strength and tone for age. Extremities: No clubbing, cyanosis or edema.   Neuro: Alert and oriented X 3. Psych:  Good affect, responds appropriately   LABS: Basic Metabolic Panel: Recent Labs    04/04/19 0347 04/05/19 0502  NA 133* 130*  K 3.9 3.9  CL 99 99  CO2 22 22  GLUCOSE 263* 208*  BUN 12 16  CREATININE 0.64 0.74  CALCIUM 8.8* 8.3*   Liver Function Tests: Recent Labs    04/03/19 0517  AST 29  ALT 34  ALKPHOS 115  BILITOT 0.7  PROT 7.0  ALBUMIN 3.1*   No results for input(s): LIPASE, AMYLASE in the last 72 hours. CBC: Recent Labs    04/04/19 0347 04/05/19 0502  WBC 6.8 7.2  HGB 8.9* 8.2*  HCT 27.8* 25.6*  MCV 88.5 86.8  PLT 251 233   Cardiac Enzymes: Recent Labs    04/02/19 1712 04/02/19 2256 04/03/19 0517   TROPONINI <0.03 <0.03 <0.03   BNP: Invalid input(s): POCBNP D-Dimer: No results for input(s): DDIMER in the last 72 hours. Hemoglobin A1C: No results for input(s): HGBA1C in the last 72 hours. Fasting Lipid Panel: No results for input(s): CHOL, HDL, LDLCALC, TRIG, CHOLHDL, LDLDIRECT in the last 72 hours. Thyroid Function Tests: Recent Labs    04/04/19 0347  TSH 1.428   Anemia Panel: No results for input(s): VITAMINB12, FOLATE, FERRITIN, TIBC, IRON, RETICCTPCT in the last 72 hours.  Mr Cervical Spine Wo Contrast  Result Date: 04/03/2019 CLINICAL DATA:  Shoulder, neck, and chest pain EXAM: MRI CERVICAL SPINE WITHOUT CONTRAST TECHNIQUE: Multiplanar, multisequence MR imaging of the cervical spine was performed. No intravenous contrast was administered. COMPARISON:  None. FINDINGS: Alignment: Loss of the normal cervical lordosis, which can be associated with muscle spasm. Vertebrae: Degenerative facet arthropathy on the left at C2-3 with associated facet edema. Cord: No significant abnormal spinal cord signal is observed. Posterior Fossa, vertebral arteries, paraspinal tissues: Unremarkable Disc levels: C2-3: Mild left foraminal impingement due to uncinate spurring and facet arthropathy. C3-4: No impingement. Small central disc protrusion. Uncinate spurring. C4-5: Mild right foraminal stenosis due to uncinate spurring. C5-6: Moderate to prominent right foraminal stenosis  and mild to moderate right eccentric central narrowing of the thecal sac due to right paracentral disc osteophyte complex, disc bulge, and uncinate spurring. C6-7: No impingement.  Right paracentral disc protrusion. C7-T1: Unremarkable. IMPRESSION: 1. Cervical spondylosis and degenerative disc disease causing moderate to prominent impingement at C5-6; and mild impingement at C2-3 and C4-5, as detailed above. Electronically Signed   By: Van Clines M.D.   On: 04/03/2019 13:45     Echo:  Normal LV function with an EF  estimated greater than 65% with a small pericardial effusion.    TELEMETRY: Sinus tachycardia   ASSESSMENT AND PLAN:  Active Problems:   Pericardial effusion    1.  Pericardial effusion   -Small per echocardiogram, started on colchicine   -Stable to discharge from a cardiac standpoint and follow up in an outpatient setting    Avie Arenas PA-C 04/05/2019 9:50 AM

## 2019-04-05 NOTE — Progress Notes (Signed)
Inpatient Diabetes Program Recommendations  AACE/ADA: New Consensus Statement on Inpatient Glycemic Control (2015)  Target Ranges:  Prepandial:   less than 140 mg/dL      Peak postprandial:   less than 180 mg/dL (1-2 hours)      Critically ill patients:  140 - 180 mg/dL   Lab Results  Component Value Date   GLUCAP 208 (H) 04/05/2019    Review of Glycemic Control  Orders: Lantus 45 units bid, Novolog 0-15 units tidwc and hs + 10 units tidwc for meal coverage  Did not receive Lantus 45 units QHS last night (5/13) Received Novolog 10 units for meal coverage, although pt only ate 20% Novolog correction and meal coverage given > 1 hour after CBG results on 5/14.  Remind RN about giving insulin within 1 hour of CBG checks. If > 1 hour, recheck CBG. Do not give meal coverage insulin if pt eating < 50%.  Inpatient Diabetes Program Recommendations:     Reduce HS Lantus to 40 units QHS.  Will continue to follow trends.  Thank you. Lorenda Peck, RD, LDN, CDE Inpatient Diabetes Coordinator [phone]

## 2019-04-05 NOTE — Care Management Important Message (Signed)
Important Message  Patient Details  Name: Kenneth Copeland MRN: [id number] Date of Birth: 09-29-1966   Medicare Important Message Given:  Yes    Dannette Barbara 04/05/2019, 1:42 PM

## 2019-04-05 NOTE — TOC Transition Note (Signed)
Transition of Care [facility]) - CM/SW Discharge Note   Patient Details  Name: Kenneth Copeland MRN: [id number] Date of Birth: 03/31/1966  Transition of Care [facility] CM/SW Contact:  Elza Rafter, RN Phone Number: 04/05/2019, 11:21 AM   Clinical Narrative:   Discharging home today with girlfriend.  Medication confirmed with [facility].  Faxed Prescriptions and added patient to [facility] spread sheet for med delivery to bedside.  PCP list previously provided.  No further needs identified.  Mediations will be delivered to bedside.  Percocet will need to be filled at a retail pharmacy.  Patient is aware.      Final next level of care: Home/Self Care Barriers to Discharge: No Barriers Identified   Patient Goals and CMS Choice        Discharge Placement                       Discharge Plan and Services   Discharge Planning Services: CM Consult, Medication Assistance                                 Social Determinants of Health (SDOH) Interventions     Readmission Risk Interventions No flowsheet data found.

## 2019-04-05 NOTE — Progress Notes (Signed)
Discharged to home with his girlfriend. Prescriptions, except percocet, filled for him.  He has no PCP but has a first appointment with one.

## 2019-04-05 NOTE — Progress Notes (Signed)
CBG of 105 with pt slightly diaphoretic and pale. Pt given orange juice and stated he felt much better with visible improvement. MD made aware, Lantus 45 units held. Will continue to monitor.

## 2019-04-05 NOTE — Evaluation (Signed)
Physical Therapy Evaluation Patient Details Name: Kenneth Copeland MRN: [id number] DOB: 1966-09-30 Today's Date: 04/05/2019   History of Present Illness  Pt is a 53 y.o. male presenting to hospital 04/02/19 with fever and L sided chest pain (with breathing).  Recent DC hospital [place].  Pt admitted with sepsis, pleuritic chest pain, DM, htn, erosive esophagitis.  No definite evidence of PE on testing.  Imaging showing cervical spondylosis and DDD causing moderate to prominent impingement at C5-6 and mild impingement at C2-3 and C4-5. (-) COVID test.  PMH includes CA, DM, htn, hernia repair, liver lobectomy, gastroparesis, pancreatitis, h/o polysubstance disorder, and erosive esophagitis.  Clinical Impression  Prior to hospital admission, pt was independent.  Pt currently lives in a hotel with his girlfriend.  Pt requesting to wear mask and wore mask during mobility.  Currently pt is independent with bed mobility, transfers, and ambulation around nursing loop.  Increased SOB noted with increased distance ambulating and pt reporting increasing L sided chest pain with breathing/SOB with increased distance ambulating.  PT offered pt a standing rest break or to get pt a chair to sit in but pt refused and kept walking until he got back to his room.  After returning to pt's room, pt's O2 sats 90-91% on room air with HR 131 bpm (returned to resting HR 118 bpm and O2 sats 92% after a few minutes of sitting rest break; pt reporting L sided chest pain decreasing from 9/10 to 7/10 with rest and no more SOB noted at rest.  Nurse notified of pt's symptoms, pain, and vitals during session.  Overall pt is independent with functional mobility and appears motivated to walk; encouraged pt to ambulate with staff.  Educated pt on pacing and activity modification d/t SOB with activity; pt verbalizing understanding.  No acute PT needs identified.  Will discharge pt from PT in house.  Please re-consult PT if pt's status changes and  acute PT needs are identified.    Follow Up Recommendations No PT follow up    Equipment Recommendations  None recommended by PT    Recommendations for Other Services       Precautions / Restrictions Precautions Precautions: Fall Restrictions Weight Bearing Restrictions: No      Mobility  Bed Mobility Overal bed mobility: Independent             General bed mobility comments: Sit to supine without any noted difficulties.  Transfers Overall transfer level: Independent Equipment used: None             General transfer comment: steady safe transfers noted  Ambulation/Gait Ambulation/Gait assistance: Independent Gait Distance (Feet): 200 Feet Assistive device: None Gait Pattern/deviations: Step-through pattern   Gait velocity interpretation: >2.62 ft/sec, indicative of community ambulatory General Gait Details: no loss of balance  Stairs            Wheelchair Mobility    Modified Rankin (Stroke Patients Only)       Balance Overall balance assessment: No apparent balance deficits (not formally assessed)                                           Pertinent Vitals/Pain Pain Assessment: 0-10 Pain Score: 7  Pain Location: L chest (with breathing) Pain Descriptors / Indicators: Sore Pain Intervention(s): Limited activity within patient's tolerance;Monitored during session;Repositioned    Home Living Family/patient expects to be discharged to::  Other (Comment)(Currently staying at hotel (no stairs)) Living Arrangements: Spouse/significant other(Girlfriend)               Additional Comments: Pt looking for permanent home.    Prior Function Level of Independence: Independent               Hand Dominance        Extremity/Trunk Assessment   Upper Extremity Assessment Upper Extremity Assessment: Generalized weakness    Lower Extremity Assessment Lower Extremity Assessment: Generalized weakness    Cervical /  Trunk Assessment Cervical / Trunk Assessment: Normal  Communication   Communication: No difficulties  Cognition Arousal/Alertness: Awake/alert Behavior During Therapy: WFL for tasks assessed/performed Overall Cognitive Status: Within Functional Limits for tasks assessed                                        General Comments  Pt refusing gait belt during session.  Pt agreeable to walking (wanted to walk but stated he hasn't walked in hallway yet d/t not having a mask).    Exercises     Assessment/Plan    PT Assessment Patent does not need any further PT services  PT Problem List         PT Treatment Interventions      PT Goals (Current goals can be found in the Care Plan section)  Acute Rehab PT Goals Patient Stated Goal: to go home PT Goal Formulation: With patient Time For Goal Achievement: 04/19/19 Potential to Achieve Goals: Good    Frequency     Barriers to discharge        Co-evaluation               AM-PAC PT "6 Clicks" Mobility  Outcome Measure Help needed turning from your back to your side while in a flat bed without using bedrails?: None Help needed moving from lying on your back to sitting on the side of a flat bed without using bedrails?: None Help needed moving to and from a bed to a chair (including a wheelchair)?: None Help needed standing up from a chair using your arms (e.g., wheelchair or bedside chair)?: None Help needed to walk in hospital room?: None Help needed climbing 3-5 steps with a railing? : None 6 Click Score: 24    End of Session Equipment Utilized During Treatment: (pt refused gait belt) Activity Tolerance: Other (comment)(limited d/t increased L chest pain with increased SOB (with increased distance ambulating)) Patient left: in bed;with call bell/phone within reach Nurse Communication: Mobility status;Precautions;Other (comment)(Pt's HR, O2 sats, and pain during session.) PT Visit Diagnosis: Muscle weakness  (generalized) (M62.81)    Time: 6734-1937 PT Time Calculation (min) (ACUTE ONLY): 10 min   Charges:   PT Evaluation $PT Eval Low Complexity: 1 Low         Hussein Macdougal, PT 04/05/19, 10:13 AM [phone]

## 2019-04-07 LAB — CULTURE, BLOOD (ROUTINE X 2)
Culture: NO GROWTH
Culture: NO GROWTH
Special Requests: ADEQUATE
Special Requests: ADEQUATE

## 2019-04-07 NOTE — Discharge Summary (Signed)
[facility] NAME: Kenneth Copeland    MR#:  [id number]  DATE OF BIRTH:  1966-11-13  DATE OF ADMISSION:  04/02/2019   ADMITTING PHYSICIAN: Sela Hua, MD  DATE OF DISCHARGE: 04/05/2019  3:16 PM  PRIMARY CARE PHYSICIAN: Patient, No Pcp Per   ADMISSION DIAGNOSIS:   Pericardial effusion [I31.3] Sepsis, due to unspecified organism, unspecified whether acute organ dysfunction present ([place]) [A41.9]  DISCHARGE DIAGNOSIS:   Active Problems:   Pericardial effusion   SECONDARY DIAGNOSIS:   Past Medical History:  Diagnosis Date  . Arthritis   . Cancer ([place])   . Diabetes mellitus without complication ([place])   . 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  1.  Sepsis-on admission, resolved during the hospital stay.  Could have been a virus infection. -Initially cultures were ordered and he was started on broad-spectrum antibiotics.  Once cultures were negative and procalcitonin was negative, antibiotics have been discontinued. -COVID-19 test has been negative. -No further fevers were documented.  2.  Pleuritic chest pain-could be pericarditis.  Echocardiogram showing small pericardial effusion. -Appreciate cardiology consult.  Started on colchicine.  Troponins x3-  3.  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  MRI of the C-spine showing moderate to severe impingement at C5-C6, C2-C3 levels. -Outpatient neurosurgery recommended.  Also on Cymbalta -Started on gabapentin.  Few opioid pills have been prescribed at discharge.  4.  Type 2  diabetes mellitus-A1c is 12.8, poorly controlled -Continue Lantus 40 twice daily, also on NovoLog pre-meals.  5.  History of erosive esophagitis based on EGD in October 2019. -Continue sucralfate and Protonix  Patient has been up and ambulatory.  Will be discharged home today  DISCHARGE CONDITIONS:   Guarded  CONSULTS OBTAINED:   Treatment Team:  Teodoro Spray, MD  DRUG ALLERGIES:   Allergies  Allergen Reactions  . Metformin And Related     Pt. States it makes his stomach bleed because of a stomach ulcer  . Motrin [Ibuprofen]     Pt. States it "inflames his stomach, causes bleeding in stomach"   DISCHARGE MEDICATIONS:   Allergies as of 04/05/2019      Reactions   Metformin And Related    Pt. States it makes his stomach bleed because of a stomach ulcer   Motrin [ibuprofen]    Pt. States it "inflames his stomach, causes bleeding in stomach"      Medication List    STOP taking these medications   lisinopril 5 MG tablet Commonly known as:  ZESTRIL     TAKE these medications   acetaminophen 325 MG tablet Commonly known as:  TYLENOL Take 650 mg by mouth every 6 (six) hours as needed for mild pain or fever.   [place] Tears 01-06-82 % Oint Place 1 drop into the left eye 2 (two) times daily as needed for dry eyes.   carboxymethylcellulose 0.5 % Soln Commonly known as:  REFRESH PLUS Place 2 drops into the left eye 3 (three) times daily.   colchicine 0.6 MG tablet Take 1 tablet (0.6 mg total) by mouth 2 (two) times daily.   dicyclomine 10 MG capsule Commonly known as:  BENTYL Take 10 mg by mouth 4 (four) times daily.   DULoxetine 30 MG capsule Commonly known as:  Cymbalta Take 1 capsule (30 mg total) by mouth daily.   gabapentin 300 MG capsule Commonly known as:  NEURONTIN Take 1 capsule (300 mg total) by mouth 3 (three) times daily.   insulin glargine 100 UNIT/ML injection Commonly known as:  LANTUS Inject 40 Units into the skin 2 (two) times daily.    insulin lispro 100 UNIT/ML injection Commonly known as:  HUMALOG Inject 10 Units into the skin 3 (three) times daily before meals.   metoCLOPramide 10 MG tablet Commonly known as:  REGLAN Take 10 mg by mouth 4 (four) times daily -  before meals and at bedtime.   neomycin-polymyxin-hydrocortisone 3.5-10000-1 ophthalmic suspension Commonly known as:  CORTISPORIN Place 1 drop into the left eye every 6 (six) hours.   oxyCODONE-acetaminophen 7.5-325 MG tablet Commonly known as:  PERCOCET Take 2 tablets by mouth every 6 (six) hours as needed for severe pain.   pantoprazole 40 MG tablet Commonly known as:  PROTONIX Take 40 mg by mouth 2 (two) times daily.   sucralfate 1 g tablet Commonly known as:  CARAFATE Take 1 g by mouth 4 (four) times daily.   tamsulosin 0.4 MG Caps capsule Commonly known as:  FLOMAX Take 1 capsule (0.4 mg total) by mouth daily.        DISCHARGE INSTRUCTIONS:   1.  PCP follow-up in 1 to 2 weeks 2.  Neurosurgery follow-up in 2 weeks  DIET:   Cardiac diet  ACTIVITY:   Activity as tolerated  OXYGEN:   Home Oxygen: No.  Oxygen Delivery: room air  DISCHARGE LOCATION:   home   If you experience worsening of your admission symptoms, develop shortness of breath, life threatening emergency, suicidal or homicidal thoughts you must seek medical attention immediately by calling 911 or calling your MD immediately  if symptoms less severe.  You Must read complete instructions/literature along with all the possible adverse reactions/side effects for all the Medicines you take and that have been prescribed to you. Take any new Medicines after you have completely understood and accpet all the possible adverse reactions/side effects.   Please note  You were cared for by a hospitalist during your hospital stay. If you have any questions about your discharge medications or the care you received while you were in the hospital after you are discharged, you can  call the unit and asked to speak with the hospitalist on call if the hospitalist that took care of you is not available. Once you are discharged, your primary care physician will handle any further medical issues. Please note that NO REFILLS for any discharge medications will be authorized once you are discharged, as it is imperative that you return to your primary care physician (or establish a relationship with a primary care physician if you do not have one) for your aftercare needs so that they can reassess your need for medications and monitor your lab values.    On the day of Discharge:  VITAL SIGNS:   Blood pressure 101/68, pulse (!) 106, temperature 99 F (37.2 C), temperature source Oral, resp. rate 20, height 5\' 11"  (1.803 m), weight 83 kg, SpO2 90 %.  PHYSICAL EXAMINATION:    GENERAL:  53 y.o.-year-old disheveled appearing patient lying in the bed with no acute distress.  EYES: Pupils equal, round, reactive to light and accommodation. No scleral icterus. Extraocular muscles intact.  HEENT: Head atraumatic,  normocephalic. Oropharynx and nasopharynx clear.  NECK:  Supple, no jugular venous distention. No thyroid enlargement, no tenderness.  LUNGS: Normal breath sounds bilaterally, no wheezing, rales,rhonchi or crepitation. No use of accessory muscles of respiration.  Decreased bibasilar breath sounds CARDIOVASCULAR: S1, S2 normal. No murmurs, rubs, or gallops.  Chest pain that worsens with sitting forward. ABDOMEN: Soft, non-tender, non-distended. Bowel sounds present. No organomegaly or mass.  EXTREMITIES: No pedal edema, cyanosis, or clubbing.  NEUROLOGIC: Cranial nerves II through XII are intact. Muscle strength 5/5 in all extremities. Sensation intact. Gait not checked.  PSYCHIATRIC: The patient is alert and oriented x 3.  SKIN: No obvious rash, lesion, or ulcer.   DATA REVIEW:   CBC Recent Labs  Lab 04/05/19 0502  WBC 7.2  HGB 8.2*  HCT 25.6*  PLT 233    Chemistries   Recent Labs  Lab 04/03/19 0517  04/05/19 0502  NA 130*   < > 130*  K 4.0   < > 3.9  CL 97*   < > 99  CO2 24   < > 22  GLUCOSE 293*   < > 208*  BUN 12   < > 16  CREATININE 0.59*   < > 0.74  CALCIUM 8.2*   < > 8.3*  AST 29  --   --   ALT 34  --   --   ALKPHOS 115  --   --   BILITOT 0.7  --   --    < > = values in this interval not displayed.     Microbiology Results  Results for orders placed or performed during the hospital encounter of 04/02/19  Blood Culture (routine x 2)     Status: None   Collection Time: 04/02/19  9:09 AM  Result Value Ref Range Status   Specimen Description BLOOD LEFT ANTECUBITAL  Final   Special Requests   Final    BOTTLES DRAWN AEROBIC AND ANAEROBIC Blood Culture adequate volume   Culture   Final    NO GROWTH 5 DAYS Performed at [facility], [address], [place], [place] [address]    Report Status 04/07/2019 FINAL  Final  Blood Culture (routine x 2)     Status: None   Collection Time: 04/02/19  9:09 AM  Result Value Ref Range Status   Specimen Description BLOOD BLOOD RIGHT FOREARM  Final   Special Requests   Final    BOTTLES DRAWN AEROBIC AND ANAEROBIC Blood Culture adequate volume   Culture   Final    NO GROWTH 5 DAYS Performed at [facility], [address], [place], [place] [address]    Report Status 04/07/2019 FINAL  Final  SARS Coronavirus 2 [facility] order, Performed in [place] hospital lab)     Status: None   Collection Time: 04/02/19  9:09 AM  Result Value Ref Range Status   SARS Coronavirus 2 NEGATIVE NEGATIVE Final    Comment: (NOTE) If result is NEGATIVE SARS-CoV-2 target nucleic acids are NOT DETECTED. The SARS-CoV-2 RNA is generally detectable in upper and lower  respiratory specimens during the acute phase of infection. The lowest  concentration of SARS-CoV-2 viral copies this assay can detect is 250  copies / mL. A negative result does not preclude SARS-CoV-2 infection  and should not be used  as the sole basis for treatment or other  patient management decisions.  A negative result may occur with  improper specimen collection / handling, submission of specimen other  than nasopharyngeal swab, presence of viral mutation(s) within the  areas targeted by this assay, and inadequate number of viral copies  (<250 copies / mL). A negative result must be combined with clinical  observations, patient history, and epidemiological information. If result is POSITIVE SARS-CoV-2 target nucleic acids are DETECTED. The SARS-CoV-2 RNA is generally detectable in upper and lower  respiratory specimens dur ing the acute phase of infection.  Positive  results are indicative of active infection with SARS-CoV-2.  Clinical  correlation with patient history and other diagnostic information is  necessary to determine patient infection status.  Positive results do  not rule out bacterial infection or co-infection with other viruses. If result is PRESUMPTIVE POSTIVE SARS-CoV-2 nucleic acids MAY BE PRESENT.   A presumptive positive result was obtained on the submitted specimen  and confirmed on repeat testing.  While 2019 novel coronavirus  (SARS-CoV-2) nucleic acids may be present in the submitted sample  additional confirmatory testing may be necessary for epidemiological  and / or clinical management purposes  to differentiate between  SARS-CoV-2 and other Sarbecovirus currently known to infect humans.  If clinically indicated additional testing with an alternate test  methodology [phone]) is advised. The SARS-CoV-2 RNA is generally  detectable in upper and lower respiratory sp ecimens during the acute  phase of infection. The expected result is Negative. Fact Sheet for Patients:  StrictlyIdeas.no Fact Sheet for Healthcare Providers: BankingDealers.co.za This test is not yet approved or cleared by the [place] FDA and has been authorized for  detection and/or diagnosis of SARS-CoV-2 by FDA under an Emergency Use Authorization (EUA).  This EUA will remain in effect (meaning this test can be used) for the duration of the COVID-19 declaration under Section 564(b)(1) of the Act, 21 U.S.C. section 360bbb-3(b)(1), unless the authorization is terminated or revoked sooner. Performed at [facility], [address] [place], [place] [address]     RADIOLOGY:  No results found.   Management plans discussed with the patient, family and they are in agreement.  CODE STATUS:  Code Status History    Date Active Date Inactive Code Status Order ID Comments User Context   04/02/2019 0102 04/05/2019 1916 Full Code [id number]  Lang Snow, NP ED      TOTAL TIME TAKING CARE OF THIS PATIENT: 38 minutes.    Gladstone Lighter M.D on 04/07/2019 at 12:20 PM  Between 7am to 6pm - Pager - [phone]  After 6pm go to [email or web address] - Proofreader  [facility]  Office  [phone]  CC: Primary care physician; Patient, No Pcp Per   Note: This dictation was prepared with Dragon dictation along with smaller phrase technology. Any transcriptional errors that result from this process are unintentional.

## 2019-04-08 ENCOUNTER — Other Ambulatory Visit: Payer: Self-pay

## 2019-04-08 ENCOUNTER — Encounter: Payer: Self-pay | Admitting: Emergency Medicine

## 2019-04-08 ENCOUNTER — Inpatient Hospital Stay
Admission: EM | Admit: 2019-04-08 | Discharge: 2019-04-25 | DRG: 871 | Disposition: A | Discharge status: Skilled Nursing Facility | Payer: Medicare (Managed Care) | Attending: Internal Medicine | Admitting: Internal Medicine

## 2019-04-08 ENCOUNTER — Emergency Department
Admit: 2019-04-08 | Discharge: 2019-04-08 | Disposition: A | Discharge status: Home / Self Care | Payer: Medicare (Managed Care) | Attending: Student in an Organized Health Care Education/Training Program | Admitting: Student in an Organized Health Care Education/Training Program

## 2019-04-08 ENCOUNTER — Emergency Department: Payer: Medicare (Managed Care)

## 2019-04-08 DIAGNOSIS — K6289 Other specified diseases of anus and rectum: Secondary | ICD-10-CM | POA: Diagnosis present

## 2019-04-08 DIAGNOSIS — I480 Paroxysmal atrial fibrillation: Secondary | ICD-10-CM | POA: Diagnosis not present

## 2019-04-08 DIAGNOSIS — I82890 Acute embolism and thrombosis of other specified veins: Secondary | ICD-10-CM | POA: Diagnosis present

## 2019-04-08 DIAGNOSIS — I313 Pericardial effusion (noninflammatory): Secondary | ICD-10-CM | POA: Diagnosis present

## 2019-04-08 DIAGNOSIS — Z23 Encounter for immunization: Secondary | ICD-10-CM

## 2019-04-08 DIAGNOSIS — A4102 Sepsis due to Methicillin resistant Staphylococcus aureus: Principal | ICD-10-CM | POA: Diagnosis present

## 2019-04-08 DIAGNOSIS — R570 Cardiogenic shock: Secondary | ICD-10-CM | POA: Diagnosis present

## 2019-04-08 DIAGNOSIS — M25512 Pain in left shoulder: Secondary | ICD-10-CM | POA: Diagnosis not present

## 2019-04-08 DIAGNOSIS — K3184 Gastroparesis: Secondary | ICD-10-CM | POA: Diagnosis present

## 2019-04-08 DIAGNOSIS — E871 Hypo-osmolality and hyponatremia: Secondary | ICD-10-CM | POA: Diagnosis not present

## 2019-04-08 DIAGNOSIS — Z886 Allergy status to analgesic agent status: Secondary | ICD-10-CM

## 2019-04-08 DIAGNOSIS — N179 Acute kidney failure, unspecified: Secondary | ICD-10-CM | POA: Diagnosis present

## 2019-04-08 DIAGNOSIS — I10 Essential (primary) hypertension: Secondary | ICD-10-CM | POA: Diagnosis present

## 2019-04-08 DIAGNOSIS — J9600 Acute respiratory failure, unspecified whether with hypoxia or hypercapnia: Secondary | ICD-10-CM

## 2019-04-08 DIAGNOSIS — Z8672 Personal history of thrombophlebitis: Secondary | ICD-10-CM

## 2019-04-08 DIAGNOSIS — Z9221 Personal history of antineoplastic chemotherapy: Secondary | ICD-10-CM

## 2019-04-08 DIAGNOSIS — I82891 Chronic embolism and thrombosis of other specified veins: Secondary | ICD-10-CM | POA: Diagnosis present

## 2019-04-08 DIAGNOSIS — Z9089 Acquired absence of other organs: Secondary | ICD-10-CM

## 2019-04-08 DIAGNOSIS — M109 Gout, unspecified: Secondary | ICD-10-CM | POA: Diagnosis present

## 2019-04-08 DIAGNOSIS — Z888 Allergy status to other drugs, medicaments and biological substances status: Secondary | ICD-10-CM

## 2019-04-08 DIAGNOSIS — R197 Diarrhea, unspecified: Secondary | ICD-10-CM | POA: Diagnosis not present

## 2019-04-08 DIAGNOSIS — F1410 Cocaine abuse, uncomplicated: Secondary | ICD-10-CM | POA: Diagnosis present

## 2019-04-08 DIAGNOSIS — M5412 Radiculopathy, cervical region: Secondary | ICD-10-CM | POA: Diagnosis present

## 2019-04-08 DIAGNOSIS — Z87891 Personal history of nicotine dependence: Secondary | ICD-10-CM

## 2019-04-08 DIAGNOSIS — N4289 Other specified disorders of prostate: Secondary | ICD-10-CM | POA: Diagnosis present

## 2019-04-08 DIAGNOSIS — K219 Gastro-esophageal reflux disease without esophagitis: Secondary | ICD-10-CM | POA: Diagnosis present

## 2019-04-08 DIAGNOSIS — G8929 Other chronic pain: Secondary | ICD-10-CM | POA: Diagnosis present

## 2019-04-08 DIAGNOSIS — M19012 Primary osteoarthritis, left shoulder: Secondary | ICD-10-CM | POA: Diagnosis present

## 2019-04-08 DIAGNOSIS — I3139 Other pericardial effusion (noninflammatory): Secondary | ICD-10-CM

## 2019-04-08 DIAGNOSIS — Z79899 Other long term (current) drug therapy: Secondary | ICD-10-CM

## 2019-04-08 DIAGNOSIS — Z8505 Personal history of malignant neoplasm of liver: Secondary | ICD-10-CM

## 2019-04-08 DIAGNOSIS — Z8249 Family history of ischemic heart disease and other diseases of the circulatory system: Secondary | ICD-10-CM

## 2019-04-08 DIAGNOSIS — R079 Chest pain, unspecified: Secondary | ICD-10-CM | POA: Diagnosis present

## 2019-04-08 DIAGNOSIS — Z794 Long term (current) use of insulin: Secondary | ICD-10-CM

## 2019-04-08 DIAGNOSIS — I471 Supraventricular tachycardia: Secondary | ICD-10-CM | POA: Diagnosis not present

## 2019-04-08 DIAGNOSIS — Z20828 Contact with and (suspected) exposure to other viral communicable diseases: Secondary | ICD-10-CM | POA: Diagnosis present

## 2019-04-08 DIAGNOSIS — E1143 Type 2 diabetes mellitus with diabetic autonomic (poly)neuropathy: Secondary | ICD-10-CM | POA: Diagnosis present

## 2019-04-08 DIAGNOSIS — I959 Hypotension, unspecified: Secondary | ICD-10-CM | POA: Diagnosis present

## 2019-04-08 DIAGNOSIS — R571 Hypovolemic shock: Secondary | ICD-10-CM | POA: Diagnosis present

## 2019-04-08 DIAGNOSIS — N400 Enlarged prostate without lower urinary tract symptoms: Secondary | ICD-10-CM | POA: Diagnosis present

## 2019-04-08 DIAGNOSIS — R6521 Severe sepsis with septic shock: Secondary | ICD-10-CM | POA: Diagnosis present

## 2019-04-08 DIAGNOSIS — J441 Chronic obstructive pulmonary disease with (acute) exacerbation: Secondary | ICD-10-CM | POA: Diagnosis present

## 2019-04-08 DIAGNOSIS — R011 Cardiac murmur, unspecified: Secondary | ICD-10-CM | POA: Diagnosis present

## 2019-04-08 DIAGNOSIS — J9601 Acute respiratory failure with hypoxia: Secondary | ICD-10-CM | POA: Diagnosis not present

## 2019-04-08 DIAGNOSIS — D649 Anemia, unspecified: Secondary | ICD-10-CM | POA: Diagnosis present

## 2019-04-08 DIAGNOSIS — J90 Pleural effusion, not elsewhere classified: Secondary | ICD-10-CM | POA: Diagnosis present

## 2019-04-08 DIAGNOSIS — M7521 Bicipital tendinitis, right shoulder: Secondary | ICD-10-CM | POA: Diagnosis present

## 2019-04-08 HISTORY — DX: Malignant neoplasm of liver, not specified as primary or secondary: C22.9

## 2019-04-08 LAB — HEPATIC FUNCTION PANEL
ALT: 29 U/L (ref 0–44)
AST: 25 U/L (ref 15–41)
Albumin: 3 g/dL — ABNORMAL LOW (ref 3.5–5.0)
Alkaline Phosphatase: 144 U/L — ABNORMAL HIGH (ref 38–126)
Bilirubin, Direct: 0.3 mg/dL — ABNORMAL HIGH (ref 0.0–0.2)
Indirect Bilirubin: 0.7 mg/dL (ref 0.3–0.9)
Total Bilirubin: 1 mg/dL (ref 0.3–1.2)
Total Protein: 7.6 g/dL (ref 6.5–8.1)

## 2019-04-08 LAB — URINALYSIS, COMPLETE (UACMP) WITH MICROSCOPIC
Bilirubin Urine: NEGATIVE
Glucose, UA: NEGATIVE mg/dL
Hgb urine dipstick: NEGATIVE
Ketones, ur: 20 mg/dL — AB
Leukocytes,Ua: NEGATIVE
Nitrite: NEGATIVE
Protein, ur: 30 mg/dL — AB
Specific Gravity, Urine: 1.016 (ref 1.005–1.030)
pH: 5 (ref 5.0–8.0)

## 2019-04-08 LAB — BASIC METABOLIC PANEL
Anion gap: 14 (ref 5–15)
BUN: 9 mg/dL (ref 6–20)
CO2: 21 mmol/L — ABNORMAL LOW (ref 22–32)
Calcium: 8.3 mg/dL — ABNORMAL LOW (ref 8.9–10.3)
Chloride: 97 mmol/L — ABNORMAL LOW (ref 98–111)
Creatinine, Ser: 0.53 mg/dL — ABNORMAL LOW (ref 0.61–1.24)
GFR calc Af Amer: >60 mL/min (ref >60)
GFR calc non Af Amer: >60 mL/min (ref >60)
Glucose, Bld: 156 mg/dL — ABNORMAL HIGH (ref 70–99)
Potassium: 4.1 mmol/L (ref 3.5–5.1)
Sodium: 132 mmol/L — ABNORMAL LOW (ref 135–145)

## 2019-04-08 LAB — TROPONIN I
Troponin I: <0.03 ng/mL (ref <0.03)
Troponin I: <0.03 ng/mL (ref <0.03)
Troponin I: <0.03 ng/mL (ref <0.03)
Troponin I: <0.03 ng/mL (ref <0.03)

## 2019-04-08 LAB — CBC
HCT: 28.8 % — ABNORMAL LOW (ref 39.0–52.0)
Hemoglobin: 9.4 g/dL — ABNORMAL LOW (ref 13.0–17.0)
MCH: 28.1 pg (ref 26.0–34.0)
MCHC: 32.6 g/dL (ref 30.0–36.0)
MCV: 86 fL (ref 80.0–100.0)
Platelets: 369 10*3/uL (ref 150–400)
RBC: 3.35 MIL/uL — ABNORMAL LOW (ref 4.22–5.81)
RDW: 14.6 % (ref 11.5–15.5)
WBC: 10.5 10*3/uL (ref 4.0–10.5)
nRBC: 0 % (ref 0.0–0.2)

## 2019-04-08 LAB — URINE DRUG SCREEN, QUALITATIVE (ARMC ONLY)
Amphetamines, Ur Screen: NOT DETECTED
Barbiturates, Ur Screen: NOT DETECTED
Benzodiazepine, Ur Scrn: NOT DETECTED
Cannabinoid 50 Ng, Ur ~~LOC~~: POSITIVE — AB
Cocaine Metabolite,Ur ~~LOC~~: NOT DETECTED
MDMA (Ecstasy)Ur Screen: NOT DETECTED
Methadone Scn, Ur: NOT DETECTED
Opiate, Ur Screen: NOT DETECTED
Phencyclidine (PCP) Ur S: NOT DETECTED
Tricyclic, Ur Screen: NOT DETECTED

## 2019-04-08 LAB — GLUCOSE, CAPILLARY
Glucose-Capillary: 164 mg/dL — ABNORMAL HIGH (ref 70–99)
Glucose-Capillary: 150 mg/dL — ABNORMAL HIGH (ref 70–99)
Glucose-Capillary: 212 mg/dL — ABNORMAL HIGH (ref 70–99)

## 2019-04-08 LAB — ECHOCARDIOGRAM LIMITED
Height: 71 in
Weight: 2880 oz

## 2019-04-08 LAB — SARS CORONAVIRUS 2 BY RT PCR (HOSPITAL ORDER, PERFORMED IN ~~LOC~~ HOSPITAL LAB): SARS Coronavirus 2: NEGATIVE

## 2019-04-08 LAB — BRAIN NATRIURETIC PEPTIDE: B Natriuretic Peptide: 184 pg/mL — ABNORMAL HIGH (ref 0.0–100.0)

## 2019-04-08 LAB — LIPASE, BLOOD: Lipase: 18 U/L (ref 11–51)

## 2019-04-08 MED ORDER — SODIUM CHLORIDE 0.9% FLUSH: 10.0000 mL | INTRAVENOUS | Status: DC | PRN

## 2019-04-08 MED ORDER — PROMETHAZINE HCL 25 MG/ML IJ SOLN
12.5000 mg | Freq: Four times a day (QID) | INTRAMUSCULAR | Status: DC | PRN
  Administered 2019-04-08: 12.5 mg via INTRAVENOUS
  Filled 2019-04-08: qty 1

## 2019-04-08 MED ORDER — OXYCODONE-ACETAMINOPHEN 7.5-325 MG PO TABS
2.0000 | ORAL_TABLET | Freq: Four times a day (QID) | ORAL | Status: DC | PRN
  Administered 2019-04-08 – 2019-04-18 (×29): 2 via ORAL
  Filled 2019-04-08 (×30): qty 2

## 2019-04-08 MED ORDER — TAMSULOSIN HCL 0.4 MG PO CAPS
0.4000 mg | ORAL_CAPSULE | Freq: Every day | ORAL | Status: DC
  Administered 2019-04-08 – 2019-04-25 (×18): 0.4 mg via ORAL
  Filled 2019-04-08 (×18): qty 1

## 2019-04-08 MED ORDER — DULOXETINE HCL 30 MG PO CPEP
30.0000 mg | ORAL_CAPSULE | Freq: Every day | ORAL | Status: DC
  Administered 2019-04-08 – 2019-04-25 (×18): 30 mg via ORAL
  Filled 2019-04-08 (×18): qty 1

## 2019-04-08 MED ORDER — LORAZEPAM 2 MG/ML IJ SOLN
0.5000 mg | Freq: Once | INTRAMUSCULAR | Status: AC
  Administered 2019-04-08: 0.5 mg via INTRAVENOUS
  Filled 2019-04-08: qty 1

## 2019-04-08 MED ORDER — NEOMYCIN-POLYMYXIN-DEXAMETH 3.5-10000-0.1 OP SUSP
1.0000 [drp] | Freq: Four times a day (QID) | OPHTHALMIC | Status: DC
  Administered 2019-04-08 – 2019-04-25 (×59): 1 [drp] via OPHTHALMIC
  Filled 2019-04-08 (×2): qty 5

## 2019-04-08 MED ORDER — GABAPENTIN 400 MG PO CAPS
800.0000 mg | ORAL_CAPSULE | Freq: Two times a day (BID) | ORAL | Status: DC
  Administered 2019-04-08: 800 mg via ORAL
  Filled 2019-04-08 (×2): qty 2

## 2019-04-08 MED ORDER — SODIUM CHLORIDE 0.9 % IV SOLN
INTRAVENOUS | Status: DC
  Administered 2019-04-08 – 2019-04-09 (×2): via INTRAVENOUS

## 2019-04-08 MED ORDER — KETOROLAC TROMETHAMINE 30 MG/ML IJ SOLN
30.0000 mg | Freq: Four times a day (QID) | INTRAMUSCULAR | Status: DC | PRN
  Filled 2019-04-08: qty 1

## 2019-04-08 MED ORDER — METOCLOPRAMIDE HCL 10 MG PO TABS
10.0000 mg | ORAL_TABLET | Freq: Three times a day (TID) | ORAL | Status: DC
  Administered 2019-04-08 – 2019-04-25 (×68): 10 mg via ORAL
  Filled 2019-04-08 (×68): qty 1

## 2019-04-08 MED ORDER — MORPHINE SULFATE (PF) 4 MG/ML IV SOLN: 4.0000 mg | INTRAVENOUS | Status: DC | PRN

## 2019-04-08 MED ORDER — INSULIN ASPART 100 UNIT/ML ~~LOC~~ SOLN
0.0000 [IU] | Freq: Every day | SUBCUTANEOUS | Status: DC
  Administered 2019-04-08: 2 [IU] via SUBCUTANEOUS
  Filled 2019-04-08: qty 1

## 2019-04-08 MED ORDER — ACETAMINOPHEN 500 MG PO TABS
1000.0000 mg | ORAL_TABLET | Freq: Once | ORAL | Status: AC
  Administered 2019-04-08: 1000 mg via ORAL
  Filled 2019-04-08: qty 2

## 2019-04-08 MED ORDER — POLYVINYL ALCOHOL 1.4 % OP SOLN
2.0000 [drp] | Freq: Three times a day (TID) | OPHTHALMIC | Status: DC
  Administered 2019-04-08 – 2019-04-25 (×46): 2 [drp] via OPHTHALMIC
  Filled 2019-04-08 (×2): qty 15

## 2019-04-08 MED ORDER — SUCRALFATE 1 G PO TABS
1.0000 g | ORAL_TABLET | Freq: Four times a day (QID) | ORAL | Status: DC
  Administered 2019-04-08 – 2019-04-25 (×68): 1 g via ORAL
  Filled 2019-04-08 (×68): qty 1

## 2019-04-08 MED ORDER — SODIUM CHLORIDE 0.9 % IV BOLUS
500.0000 mL | Freq: Once | INTRAVENOUS | Status: AC
  Administered 2019-04-08: 500 mL via INTRAVENOUS

## 2019-04-08 MED ORDER — ARTIFICIAL TEARS OPHTHALMIC OINT
1.0000 "application " | TOPICAL_OINTMENT | Freq: Two times a day (BID) | OPHTHALMIC | Status: DC | PRN
  Filled 2019-04-08: qty 3.5

## 2019-04-08 MED ORDER — DICYCLOMINE HCL 10 MG PO CAPS
10.0000 mg | ORAL_CAPSULE | Freq: Four times a day (QID) | ORAL | Status: DC
  Administered 2019-04-08 – 2019-04-25 (×67): 10 mg via ORAL
  Filled 2019-04-08 (×69): qty 1

## 2019-04-08 MED ORDER — ACETAMINOPHEN 650 MG RE SUPP: 650.0000 mg | Freq: Four times a day (QID) | RECTAL | Status: DC | PRN

## 2019-04-08 MED ORDER — COLCHICINE 0.6 MG PO TABS
0.6000 mg | ORAL_TABLET | Freq: Two times a day (BID) | ORAL | Status: DC
  Administered 2019-04-08 – 2019-04-25 (×34): 0.6 mg via ORAL
  Filled 2019-04-08 (×37): qty 1

## 2019-04-08 MED ORDER — ONDANSETRON HCL 4 MG PO TABS: 4.0000 mg | ORAL_TABLET | Freq: Four times a day (QID) | ORAL | Status: DC | PRN

## 2019-04-08 MED ORDER — ACETAMINOPHEN 325 MG PO TABS
650.0000 mg | ORAL_TABLET | Freq: Four times a day (QID) | ORAL | Status: DC | PRN
  Administered 2019-04-10 – 2019-04-12 (×2): 650 mg via ORAL
  Filled 2019-04-08 (×2): qty 2

## 2019-04-08 MED ORDER — ENOXAPARIN SODIUM 40 MG/0.4ML ~~LOC~~ SOLN
40.0000 mg | SUBCUTANEOUS | Status: DC
  Administered 2019-04-08: 40 mg via SUBCUTANEOUS
  Filled 2019-04-08: qty 0.4

## 2019-04-08 MED ORDER — ONDANSETRON HCL 4 MG/2ML IJ SOLN: 4.0000 mg | Freq: Four times a day (QID) | INTRAMUSCULAR | Status: DC | PRN

## 2019-04-08 MED ORDER — PNEUMOCOCCAL VAC POLYVALENT 25 MCG/0.5ML IJ INJ
0.5000 mL | INJECTION | INTRAMUSCULAR | Status: AC
  Administered 2019-04-09: 0.5 mL via INTRAMUSCULAR
  Filled 2019-04-08: qty 0.5

## 2019-04-08 MED ORDER — PANTOPRAZOLE SODIUM 40 MG PO TBEC
40.0000 mg | DELAYED_RELEASE_TABLET | Freq: Two times a day (BID) | ORAL | Status: DC
  Administered 2019-04-08 – 2019-04-25 (×35): 40 mg via ORAL
  Filled 2019-04-08 (×35): qty 1

## 2019-04-08 MED ORDER — GABAPENTIN 300 MG PO CAPS
300.0000 mg | ORAL_CAPSULE | Freq: Three times a day (TID) | ORAL | Status: DC
  Administered 2019-04-08 – 2019-04-09 (×3): 300 mg via ORAL
  Filled 2019-04-08 (×3): qty 1

## 2019-04-08 MED ORDER — INSULIN ASPART 100 UNIT/ML ~~LOC~~ SOLN
0.0000 [IU] | Freq: Three times a day (TID) | SUBCUTANEOUS | Status: DC
  Administered 2019-04-08: 1 [IU] via SUBCUTANEOUS
  Administered 2019-04-08: 2 [IU] via SUBCUTANEOUS
  Administered 2019-04-09: 7 [IU] via SUBCUTANEOUS
  Filled 2019-04-08 (×3): qty 1

## 2019-04-08 NOTE — Progress Notes (Signed)
*  PRELIMINARY RESULTS* Echocardiogram 2D Echocardiogram has been performed. Limited Echo for Pericardial Effusion. [place] 04/08/2019, 9:03 AM

## 2019-04-08 NOTE — ED Notes (Signed)
ED TO INPATIENT HANDOFF REPORT  ED Nurse Name and Phone #: Levada Dy [phone]  S Name/Age/Gender Kenneth Copeland 53 y.o. male Room/Bed: ED04A/ED04A  Code Status   Code Status: Prior  Home/SNF/Other Home Patient oriented to: self, place, time and situation Is this baseline? Yes   Triage Complete: Triage complete  Chief Complaint Ala  EMS Chest Pain  Triage Note Patient brought in by ems from home with complaint of chest pain and shortness of breath times two weeks. Patient states that he was diagnosed with fluid around his heart 2 weeks ago. Patient states that he was seen here 2 days ago for the same. Patient states that the pain has become worse.    Allergies Allergies  Allergen Reactions  . Metformin And Related     Pt. States it makes his stomach bleed because of a stomach ulcer  . Motrin [Ibuprofen]     Pt. States it "inflames his stomach, causes bleeding in stomach"    Level of Care/Admitting Diagnosis ED Disposition    ED Disposition Condition [place]: [place] [100120]  Level of Care: Telemetry [5]  Covid Evaluation: Screening Protocol (No Symptoms)  Diagnosis: Chest pain [735329]  Admitting Physician: Henreitta Leber [[id number]]  Attending Physician: Henreitta Leber [[id number]]  PT Class (Do Not Modify): Observation [104]  PT Acc Code (Do Not Modify): Observation [10022]       B Medical/Surgery History Past Medical History:  Diagnosis Date  . Arthritis   . Cancer ([place])   . Diabetes mellitus without complication ([place])   . Hypertension   . Liver cancer [facility])    Past Surgical History:  Procedure Laterality Date  . HERNIA REPAIR    . LIVER LOBECTOMY       A IV Location/Drains/Wounds Patient Lines/Drains/Airways Status   Active Line/Drains/Airways    Name:   Placement date:   Placement time:   Site:   Days:   Peripheral IV 04/08/19 Left Other (Comment)   04/08/19    0727    Other (Comment)   less than 1           Intake/Output Last 24 hours No intake or output data in the 24 hours ending 04/08/19 1006  Labs/Imaging Results for orders placed or performed during the hospital encounter of 04/08/19 (from the past 48 hour(s))  Basic metabolic panel     Status: Abnormal   Collection Time: 04/08/19  6:52 AM  Result Value Ref Range   Sodium 132 (L) 135 - 145 mmol/L   Potassium 4.1 3.5 - 5.1 mmol/L   Chloride 97 (L) 98 - 111 mmol/L   CO2 21 (L) 22 - 32 mmol/L   Glucose, Bld 156 (H) 70 - 99 mg/dL   BUN 9 6 - 20 mg/dL   Creatinine, Ser 0.53 (L) 0.61 - 1.24 mg/dL   Calcium 8.3 (L) 8.9 - 10.3 mg/dL   GFR calc non Af Amer >60 >60 mL/min   GFR calc Af Amer >60 >60 mL/min   Anion gap 14 5 - 15    Comment: Performed at [facility], [address] [place], [place] [address]  CBC     Status: Abnormal   Collection Time: 04/08/19  6:52 AM  Result Value Ref Range   WBC 10.5 4.0 - 10.5 K/uL   RBC 3.35 (L) 4.22 - 5.81 MIL/uL   Hemoglobin 9.4 (L) 13.0 - 17.0 g/dL   HCT 28.8 (L) 39.0 - 52.0 %  MCV 86.0 80.0 - 100.0 fL   MCH 28.1 26.0 - 34.0 pg   MCHC 32.6 30.0 - 36.0 g/dL   RDW 14.6 11.5 - 15.5 %   Platelets 369 150 - 400 K/uL   nRBC 0.0 0.0 - 0.2 %    Comment: Performed at [facility], [address] [place], [place] [address]  Troponin I - ONCE - STAT     Status: None   Collection Time: 04/08/19  6:52 AM  Result Value Ref Range   Troponin I <0.03 <0.03 ng/mL    Comment: Performed at [facility], [address] [place], [place] [address]  Lipase, blood     Status: None   Collection Time: 04/08/19  6:52 AM  Result Value Ref Range   Lipase 18 11 - 51 U/L    Comment: Performed at [facility], [address] [place], [place] [address]  Hepatic function panel     Status: Abnormal   Collection Time: 04/08/19  6:52 AM  Result Value Ref Range   Total Protein 7.6 6.5 - 8.1 g/dL   Albumin 3.0 (L) 3.5 - 5.0 g/dL   AST 25 15 - 41 U/L   ALT 29 0 - 44  U/L   Alkaline Phosphatase 144 (H) 38 - 126 U/L   Total Bilirubin 1.0 0.3 - 1.2 mg/dL   Bilirubin, Direct 0.3 (H) 0.0 - 0.2 mg/dL   Indirect Bilirubin 0.7 0.3 - 0.9 mg/dL    Comment: Performed at [facility], [address] [place], [place] [address]  Brain natriuretic peptide     Status: Abnormal   Collection Time: 04/08/19  6:52 AM  Result Value Ref Range   B Natriuretic Peptide 184.0 (H) 0.0 - 100.0 pg/mL    Comment: Performed at [facility], [address] [place], [place] [address]  SARS Coronavirus 2 (CEPHEID - Performed in [place] hospital lab), Hosp Order     Status: None   Collection Time: 04/08/19  7:40 AM  Result Value Ref Range   SARS Coronavirus 2 NEGATIVE NEGATIVE    Comment: (NOTE) If result is NEGATIVE SARS-CoV-2 target nucleic acids are NOT DETECTED. The SARS-CoV-2 RNA is generally detectable in upper and lower  respiratory specimens during the acute phase of infection. The lowest  concentration of SARS-CoV-2 viral copies this assay can detect is 250  copies / mL. A negative result does not preclude SARS-CoV-2 infection  and should not be used as the sole basis for treatment or other  patient management decisions.  A negative result may occur with  improper specimen collection / handling, submission of specimen other  than nasopharyngeal swab, presence of viral mutation(s) within the  areas targeted by this assay, and inadequate number of viral copies  (<250 copies / mL). A negative result must be combined with clinical  observations, patient history, and epidemiological information. If result is POSITIVE SARS-CoV-2 target nucleic acids are DETECTED. The SARS-CoV-2 RNA is generally detectable in upper and lower  respiratory specimens dur ing the acute phase of infection.  Positive  results are indicative of active infection with SARS-CoV-2.  Clinical  correlation with patient history and other diagnostic information is  necessary to  determine patient infection status.  Positive results do  not rule out bacterial infection or co-infection with other viruses. If result is PRESUMPTIVE POSTIVE SARS-CoV-2 nucleic acids MAY BE PRESENT.   A presumptive positive result was obtained on the submitted specimen  and confirmed on  repeat testing.  While 2019 novel coronavirus  (SARS-CoV-2) nucleic acids may be present in the submitted sample  additional confirmatory testing may be necessary for epidemiological  and / or clinical management purposes  to differentiate between  SARS-CoV-2 and other Sarbecovirus currently known to infect humans.  If clinically indicated additional testing with an alternate test  methodology [phone]) is advised. The SARS-CoV-2 RNA is generally  detectable in upper and lower respiratory sp ecimens during the acute  phase of infection. The expected result is Negative. Fact Sheet for Patients:  StrictlyIdeas.no Fact Sheet for Healthcare Providers: BankingDealers.co.za This test is not yet approved or cleared by the [place] FDA and has been authorized for detection and/or diagnosis of SARS-CoV-2 by FDA under an Emergency Use Authorization (EUA).  This EUA will remain in effect (meaning this test can be used) for the duration of the COVID-19 declaration under Section 564(b)(1) of the Act, 21 U.S.C. section 360bbb-3(b)(1), unless the authorization is terminated or revoked sooner. Performed at [facility], [address] [place], [place] [address]   Urinalysis, Complete w Microscopic     Status: Abnormal   Collection Time: 04/08/19  8:17 AM  Result Value Ref Range   Color, Urine YELLOW (A) YELLOW   APPearance HAZY (A) CLEAR   Specific Gravity, Urine 1.016 1.005 - 1.030   pH 5.0 5.0 - 8.0   Glucose, UA NEGATIVE NEGATIVE mg/dL   Hgb urine dipstick NEGATIVE NEGATIVE   Bilirubin Urine NEGATIVE NEGATIVE   Ketones, ur 20 (A) NEGATIVE mg/dL    Protein, ur 30 (A) NEGATIVE mg/dL   Nitrite NEGATIVE NEGATIVE   Leukocytes,Ua NEGATIVE NEGATIVE   RBC / HPF 0-5 0 - 5 RBC/hpf   WBC, UA 11-20 0 - 5 WBC/hpf   Bacteria, UA RARE (A) NONE SEEN   Squamous Epithelial / LPF 0-5 0 - 5   Mucus PRESENT     Comment: Performed at [facility], [address] [place], [place] [address]  Urine Drug Screen, Qualitative ([facility] only)     Status: Abnormal   Collection Time: 04/08/19  8:17 AM  Result Value Ref Range   Tricyclic, Ur Screen NONE DETECTED NONE DETECTED   Amphetamines, Ur Screen NONE DETECTED NONE DETECTED   MDMA (Ecstasy)Ur Screen NONE DETECTED NONE DETECTED   Cocaine Metabolite,Ur [place] NONE DETECTED NONE DETECTED   Opiate, Ur Screen NONE DETECTED NONE DETECTED   Phencyclidine (PCP) Ur S NONE DETECTED NONE DETECTED   Cannabinoid 50 Ng, Ur [place] POSITIVE (A) NONE DETECTED   Barbiturates, Ur Screen NONE DETECTED NONE DETECTED   Benzodiazepine, Ur Scrn NONE DETECTED NONE DETECTED   Methadone Scn, Ur NONE DETECTED NONE DETECTED    Comment: (NOTE) Tricyclics + metabolites, urine    Cutoff 1000 ng/mL Amphetamines + metabolites, urine  Cutoff 1000 ng/mL MDMA (Ecstasy), urine              Cutoff 500 ng/mL Cocaine Metabolite, urine          Cutoff 300 ng/mL Opiate + metabolites, urine        Cutoff 300 ng/mL Phencyclidine (PCP), urine         Cutoff 25 ng/mL Cannabinoid, urine                 Cutoff 50 ng/mL Barbiturates + metabolites, urine  Cutoff 200 ng/mL Benzodiazepine, urine              Cutoff 200 ng/mL Methadone, urine  Cutoff 300 ng/mL The urine drug screen provides only a preliminary, unconfirmed analytical test result and should not be used for non-medical purposes. Clinical consideration and professional judgment should be applied to any positive drug screen result due to possible interfering substances. A more specific alternate chemical method must be used in order to obtain a confirmed analytical  result. Gas chromatography / mass spectrometry (GC/MS) is the preferred confirmat ory method. Performed at [facility], [address] [place], [place] [address]    Dg Chest Portable 1 View  Result Date: 04/08/2019 CLINICAL DATA:  Weakness, pericardial effusion, history of liver cancer EXAM: PORTABLE CHEST 1 VIEW COMPARISON:  CTA chest dated 04/02/2019 FINDINGS: Mild bibasilar atelectasis with trace bilateral pleural effusions. No frank interstitial edema. No pneumothorax. Enlargement of the cardiac silhouette, corresponding to known pericardial effusion. IMPRESSION: Trace bilateral pleural effusions. Known pericardial effusion. Electronically Signed   By: Julian Hy M.D.   On: 04/08/2019 07:21    Pending Labs [employer] (From admission, onward)    Start     Ordered   Signed and Held  HIV antibody (Routine Testing)  Once,   R     Signed and Held   Signed and Occupational hygienist morning,   R     Signed and Held   Signed and Held  CBC  Tomorrow morning,   R     Signed and Held   Signed and Held  CBC  (enoxaparin (LOVENOX)    CrCl >/= 30 ml/min)  Once,   R    Comments:  Baseline for enoxaparin therapy IF NOT ALREADY DRAWN.  Notify MD if PLT < 100 K.    Signed and Held   Signed and Held  Creatinine, serum  (enoxaparin (LOVENOX)    CrCl >/= 30 ml/min)  Once,   R    Comments:  Baseline for enoxaparin therapy IF NOT ALREADY DRAWN.    Signed and Held   Signed and Held  Creatinine, serum  (enoxaparin (LOVENOX)    CrCl >/= 30 ml/min)  Weekly,   R    Comments:  while on enoxaparin therapy    Signed and Held   Signed and Held  Troponin I - Now Then Q4H  Now then every 4 hours,   R     Signed and Held          Vitals/Pain Today's Vitals   04/08/19 0800 04/08/19 0830 04/08/19 0900 04/08/19 0921  BP: (!) 151/102 (!) 151/107 (!) 130/103   Pulse: (!) 122 (!) 119 (!) 117   Resp:      Temp:      TempSrc:      SpO2: 94% 94% 94%   Weight:       Height:      PainSc:    6     Isolation Precautions No active isolations  Medications Medications  promethazine (PHENERGAN) injection 12.5 mg (12.5 mg Intravenous Given 04/08/19 0734)  gabapentin (NEURONTIN) capsule 800 mg (800 mg Oral Given 04/08/19 0914)  LORazepam (ATIVAN) injection 0.5 mg (0.5 mg Intravenous Given 04/08/19 0734)  acetaminophen (TYLENOL) tablet 1,000 mg (1,000 mg Oral Given 04/08/19 0827)  sodium chloride 0.9 % bolus 500 mL (500 mLs Intravenous New Bag/Given 04/08/19 2876)    Mobility walks     Focused Assessments Cardiac Assessment Handoff:  Cardiac Rhythm: Sinus tachycardia Lab Results  Component Value Date   TROPONINI <0.03 04/08/2019   No results found for: DDIMER Does  the Patient currently have chest pain? Yes     R Recommendations: See Admitting Provider Note  Report given to:   Additional Notes:

## 2019-04-08 NOTE — Consult Note (Signed)
Reason for Consult: Shortness of breath chest pain pericardial effusion Referring Physician: Dr. Verdell Carmine hospitalist  Kenneth Copeland is an 53 y.o. male.  HPI: Presents with shortness of breath dyspnea tachycardia patient with history of chronic pain diabetes hypertension hepatocellular carcinoma osteoarthritis neuropathy recently discharged return back with progressive worsening chest pain or shortness of breath.  Patient states that the pain never really went away.  He describes pain in the center of his chest nonradiating associate with some shortness of breath and chills.  Patient was mentioned fever at home but none documented here.  Patient was has not had improvement in his pain despite medications because of tachycardia shortness of breath and chest pain patient brought to the emergency room repeat echocardiogram emergency room suggested significant improvement in recent pericardial effusion from small to moderate within 5 days concerning for possible inflammatory versus malignant pericardial effusion patient now here for further assessment evaluation  Past Medical History:  Diagnosis Date  . Arthritis   . Cancer ([place])   . Diabetes mellitus without complication ([place])   . Hypertension   . Liver cancer [facility] [place])     Past Surgical History:  Procedure Laterality Date  . HERNIA REPAIR    . LIVER LOBECTOMY      Family History  Family history unknown: Yes    Social History:  reports that he has quit smoking. He has never used smokeless tobacco. He reports previous alcohol use. He reports current drug use. Frequency: 0.50 times per week. Drug: Marijuana.  Allergies:  Allergies  Allergen Reactions  . Metformin And Related     Pt. States it makes his stomach bleed because of a stomach ulcer  . Motrin [Ibuprofen]     Pt. States it "inflames his stomach, causes bleeding in stomach"    Medications: I have reviewed the patient's current medications.  Results for orders placed or performed  during the hospital encounter of 04/08/19 (from the past 48 hour(s))  Basic metabolic panel     Status: Abnormal   Collection Time: 04/08/19  6:52 AM  Result Value Ref Range   Sodium 132 (L) 135 - 145 mmol/L   Potassium 4.1 3.5 - 5.1 mmol/L   Chloride 97 (L) 98 - 111 mmol/L   CO2 21 (L) 22 - 32 mmol/L   Glucose, Bld 156 (H) 70 - 99 mg/dL   BUN 9 6 - 20 mg/dL   Creatinine, Ser 0.53 (L) 0.61 - 1.24 mg/dL   Calcium 8.3 (L) 8.9 - 10.3 mg/dL   GFR calc non Af Amer >60 >60 mL/min   GFR calc Af Amer >60 >60 mL/min   Anion gap 14 5 - 15    Comment: Performed at [facility], [address] [place], [place] [address]  CBC     Status: Abnormal   Collection Time: 04/08/19  6:52 AM  Result Value Ref Range   WBC 10.5 4.0 - 10.5 K/uL   RBC 3.35 (L) 4.22 - 5.81 MIL/uL   Hemoglobin 9.4 (L) 13.0 - 17.0 g/dL   HCT 28.8 (L) 39.0 - 52.0 %   MCV 86.0 80.0 - 100.0 fL   MCH 28.1 26.0 - 34.0 pg   MCHC 32.6 30.0 - 36.0 g/dL   RDW 14.6 11.5 - 15.5 %   Platelets 369 150 - 400 K/uL   nRBC 0.0 0.0 - 0.2 %    Comment: Performed at [facility], [address], [place], [place] [address]  Troponin I - ONCE - STAT  Status: None   Collection Time: 04/08/19  6:52 AM  Result Value Ref Range   Troponin I <0.03 <0.03 ng/mL    Comment: Performed at [facility], [address] [place], [place] [address]  Lipase, blood     Status: None   Collection Time: 04/08/19  6:52 AM  Result Value Ref Range   Lipase 18 11 - 51 U/L    Comment: Performed at [facility], [address] [place], [place] [address]  Hepatic function panel     Status: Abnormal   Collection Time: 04/08/19  6:52 AM  Result Value Ref Range   Total Protein 7.6 6.5 - 8.1 g/dL   Albumin 3.0 (L) 3.5 - 5.0 g/dL   AST 25 15 - 41 U/L   ALT 29 0 - 44 U/L   Alkaline Phosphatase 144 (H) 38 - 126 U/L   Total Bilirubin 1.0 0.3 - 1.2 mg/dL   Bilirubin, Direct 0.3 (H) 0.0 - 0.2 mg/dL   Indirect Bilirubin 0.7  0.3 - 0.9 mg/dL    Comment: Performed at [facility], [address] [place], [place] [address]  Brain natriuretic peptide     Status: Abnormal   Collection Time: 04/08/19  6:52 AM  Result Value Ref Range   B Natriuretic Peptide 184.0 (H) 0.0 - 100.0 pg/mL    Comment: Performed at [facility], [address] [place],  [address]  SARS Coronavirus 2 (CEPHEID - Performed in [place] hospital lab), Hosp Order     Status: None   Collection Time: 04/08/19  7:40 AM  Result Value Ref Range   SARS Coronavirus 2 NEGATIVE NEGATIVE    Comment: (NOTE) If result is NEGATIVE SARS-CoV-2 target nucleic acids are NOT DETECTED. The SARS-CoV-2 RNA is generally detectable in upper and lower  respiratory specimens during the acute phase of infection. The lowest  concentration of SARS-CoV-2 viral copies this assay can detect is 250  copies / mL. A negative result does not preclude SARS-CoV-2 infection  and should not be used as the sole basis for treatment or other  patient management decisions.  A negative result may occur with  improper specimen collection / handling, submission of specimen other  than nasopharyngeal swab, presence of viral mutation(s) within the  areas targeted by this assay, and inadequate number of viral copies  (<250 copies / mL). A negative result must be combined with clinical  observations, patient history, and epidemiological information. If result is POSITIVE SARS-CoV-2 target nucleic acids are DETECTED. The SARS-CoV-2 RNA is generally detectable in upper and lower  respiratory specimens dur ing the acute phase of infection.  Positive  results are indicative of active infection with SARS-CoV-2.  Clinical  correlation with patient history and other diagnostic information is  necessary to determine patient infection status.  Positive results do  not rule out bacterial infection or co-infection with other viruses. If result is PRESUMPTIVE  POSTIVE SARS-CoV-2 nucleic acids MAY BE PRESENT.   A presumptive positive result was obtained on the submitted specimen  and confirmed on repeat testing.  While 2019 novel coronavirus  (SARS-CoV-2) nucleic acids may be present in the submitted sample  additional confirmatory testing may be necessary for epidemiological  and / or clinical management purposes  to differentiate between  SARS-CoV-2 and other Sarbecovirus currently known to infect humans.  If clinically indicated additional testing with an alternate test  methodology [phone]) is advised. The SARS-CoV-2 RNA is generally  detectable in upper and  lower respiratory sp ecimens during the acute  phase of infection. The expected result is Negative. Fact Sheet for Patients:  StrictlyIdeas.no Fact Sheet for Healthcare Providers: BankingDealers.co.za This test is not yet approved or cleared by the [place] FDA and has been authorized for detection and/or diagnosis of SARS-CoV-2 by FDA under an Emergency Use Authorization (EUA).  This EUA will remain in effect (meaning this test can be used) for the duration of the COVID-19 declaration under Section 564(b)(1) of the Act, 21 U.S.C. section 360bbb-3(b)(1), unless the authorization is terminated or revoked sooner. Performed at [facility], [address] [place], [place] [address]   Urinalysis, Complete w Microscopic     Status: Abnormal   Collection Time: 04/08/19  8:17 AM  Result Value Ref Range   Color, Urine YELLOW (A) YELLOW   APPearance HAZY (A) CLEAR   Specific Gravity, Urine 1.016 1.005 - 1.030   pH 5.0 5.0 - 8.0   Glucose, UA NEGATIVE NEGATIVE mg/dL   Hgb urine dipstick NEGATIVE NEGATIVE   Bilirubin Urine NEGATIVE NEGATIVE   Ketones, ur 20 (A) NEGATIVE mg/dL   Protein, ur 30 (A) NEGATIVE mg/dL   Nitrite NEGATIVE NEGATIVE   Leukocytes,Ua NEGATIVE NEGATIVE   RBC / HPF 0-5 0 - 5 RBC/hpf   WBC, UA 11-20 0 - 5  WBC/hpf   Bacteria, UA RARE (A) NONE SEEN   Squamous Epithelial / LPF 0-5 0 - 5   Mucus PRESENT     Comment: Performed at [facility], [address], [place], [place] [address]  Urine Drug Screen, Qualitative ([facility] only)     Status: Abnormal   Collection Time: 04/08/19  8:17 AM  Result Value Ref Range   Tricyclic, Ur Screen NONE DETECTED NONE DETECTED   Amphetamines, Ur Screen NONE DETECTED NONE DETECTED   MDMA (Ecstasy)Ur Screen NONE DETECTED NONE DETECTED   Cocaine Metabolite,Ur [place] NONE DETECTED NONE DETECTED   Opiate, Ur Screen NONE DETECTED NONE DETECTED   Phencyclidine (PCP) Ur S NONE DETECTED NONE DETECTED   Cannabinoid 50 Ng, Ur [place] POSITIVE (A) NONE DETECTED   Barbiturates, Ur Screen NONE DETECTED NONE DETECTED   Benzodiazepine, Ur Scrn NONE DETECTED NONE DETECTED   Methadone Scn, Ur NONE DETECTED NONE DETECTED    Comment: (NOTE) Tricyclics + metabolites, urine    Cutoff 1000 ng/mL Amphetamines + metabolites, urine  Cutoff 1000 ng/mL MDMA (Ecstasy), urine              Cutoff 500 ng/mL Cocaine Metabolite, urine          Cutoff 300 ng/mL Opiate + metabolites, urine        Cutoff 300 ng/mL Phencyclidine (PCP), urine         Cutoff 25 ng/mL Cannabinoid, urine                 Cutoff 50 ng/mL Barbiturates + metabolites, urine  Cutoff 200 ng/mL Benzodiazepine, urine              Cutoff 200 ng/mL Methadone, urine                   Cutoff 300 ng/mL The urine drug screen provides only a preliminary, unconfirmed analytical test result and should not be used for non-medical purposes. Clinical consideration and professional judgment should be applied to any positive drug screen result due to possible interfering substances. A more specific alternate chemical method must be used in order to obtain a confirmed analytical result. Gas chromatography /  mass spectrometry (GC/MS) is the preferred confirmat ory method. Performed at [facility], [address]  [place], [place] [address]   Troponin I - Now Then Q4H     Status: None   Collection Time: 04/08/19 12:09 PM  Result Value Ref Range   Troponin I <0.03 <0.03 ng/mL    Comment: Performed at [facility], [address] [place], [place] [address]  Glucose, capillary     Status: Abnormal   Collection Time: 04/08/19  1:30 PM  Result Value Ref Range   Glucose-Capillary 164 (H) 70 - 99 mg/dL   Comment 1 Notify RN    Comment 2 Document in Chart   Troponin I - Now Then Q4H     Status: None   Collection Time: 04/08/19  3:37 PM  Result Value Ref Range   Troponin I <0.03 <0.03 ng/mL    Comment: Performed at [facility], [address] [place], [place] [address]  Glucose, capillary     Status: Abnormal   Collection Time: 04/08/19  5:08 PM  Result Value Ref Range   Glucose-Capillary 150 (H) 70 - 99 mg/dL   Comment 1 Notify RN    Comment 2 Document in Chart   Troponin I - Now Then Q4H     Status: None   Collection Time: 04/08/19  7:24 PM  Result Value Ref Range   Troponin I <0.03 <0.03 ng/mL    Comment: Performed at [facility], [address] [place], [place] [address]  Glucose, capillary     Status: Abnormal   Collection Time: 04/08/19 10:13 PM  Result Value Ref Range   Glucose-Capillary 212 (H) 70 - 99 mg/dL    Dg Chest Portable 1 View  Result Date: 04/08/2019 CLINICAL DATA:  Weakness, pericardial effusion, history of liver cancer EXAM: PORTABLE CHEST 1 VIEW COMPARISON:  CTA chest dated 04/02/2019 FINDINGS: Mild bibasilar atelectasis with trace bilateral pleural effusions. No frank interstitial edema. No pneumothorax. Enlargement of the cardiac silhouette, corresponding to known pericardial effusion. IMPRESSION: Trace bilateral pleural effusions. Known pericardial effusion. Electronically Signed   By: Julian Hy M.D.   On: 04/08/2019 07:21    Review of Systems  Constitutional: Positive for diaphoresis, malaise/fatigue and weight loss.  HENT:  Positive for congestion.   Eyes: Negative.   Respiratory: Positive for shortness of breath.   Cardiovascular: Positive for chest pain, palpitations and PND.  Gastrointestinal: Positive for abdominal pain and heartburn.  Genitourinary: Negative.   Musculoskeletal: Positive for back pain and myalgias.  Skin: Negative.   Neurological: Positive for dizziness and weakness.  Endo/Heme/Allergies: Negative.   Psychiatric/Behavioral: Positive for substance abuse. The patient is nervous/anxious.    Blood pressure 112/86, pulse (!) 118, temperature 98.2 F (36.8 C), temperature source Oral, resp. rate 18, height 5\' 11"  (1.803 m), weight 84.3 kg, SpO2 93 %. Physical Exam  Nursing note and vitals reviewed. Constitutional: He is oriented to person, place, and time. He appears well-developed and well-nourished.  HENT:  Head: Normocephalic and atraumatic.  Eyes: Pupils are equal, round, and reactive to light. Conjunctivae and EOM are normal.  Neck: Normal range of motion. Neck supple.  Cardiovascular: Regular rhythm and normal pulses. Tachycardia present. Exam reveals distant heart sounds.  Murmur heard.  Systolic murmur is present. Respiratory: Effort normal.  GI: Soft. Bowel sounds are normal.  Musculoskeletal: Normal range of motion.  Neurological: He is alert and oriented to person, place, and time. He has normal reflexes.  Skin: Skin is  warm and dry.  Psychiatric: He has a normal mood and affect.    Assessment/Plan: Shortness of breath Chest pain Pericarditis Pericardial effusion Hepatocellular carcinoma Tachycardia Hypertension GERD Diabetes Chronic pain . Plan Agree with admit for evaluation Continue pain management and control Agree with echocardiogram for assessment of pericardial effusion Consider diagnostic as well as potentially therapeutic pericardiocentesis Continue diabetes management and control Follow-up EKGs and troponins Chronic pain neuropathy currently on  gabapentin Cymbalta and Percocet for now Consider oncology input for possible hepatocellular spread Must evaluate for possible malignant pericardial effusion   Takeshi Teasdale D Stepfanie Yott 04/08/2019, 10:32 PM

## 2019-04-08 NOTE — ED Notes (Signed)
Report given to Steve, RN

## 2019-04-08 NOTE — ED Triage Notes (Signed)
Patient brought in by ems from home with complaint of chest pain and shortness of breath times two weeks. Patient states that he was diagnosed with fluid around his heart 2 weeks ago. Patient states that he was seen here 2 days ago for the same. Patient states that the pain has become worse.

## 2019-04-08 NOTE — Plan of Care (Signed)
  Problem: Clinical Measurements: Goal: Ability to maintain clinical measurements within normal limits will improve Outcome: Not Progressing Note:  Sodium level = only 132. Will continue to monitor lab values. Wenda Low [facility]

## 2019-04-08 NOTE — ED Notes (Signed)
RN in room to recheck pts blood pressure before transferring to the floor. Pt has ripped out blood pressure cuff, cardiac leads, and pulse ox. Pt also ripped IV out.

## 2019-04-08 NOTE — ED Provider Notes (Signed)
[facility] Emergency Department Provider Note    First MD Initiated Contact with Patient 04/08/19 [phone]     (approximate)  I have reviewed the triage vital signs and the nursing notes.   HISTORY  Chief Complaint Shortness of Breath and Chest Pain    HPI Kenneth Copeland is a 53 y.o. male the below listed past medical history as well as polysubstance abuse and recent diagnosis pericardial effusion pericarditis started on colchicine with recent admission to hospital for sepsis also discharged on Bactrim after cultures returned negative Free presents the ER for chest discomfort.  Patient recently moved to the area states that he has family here and is currently living out of a hotel.  States that he was feeling nauseated as well as having chest pain.  Denied any diaphoresis.  States the pain is mild to moderate.  Did not take any of his medications this morning.  Denies any fevers.  States that he is having cough and congestion.   CTA 5/11: IMPRESSION: No definite evidence of pulmonary embolus.  Mild to moderate size pericardial effusion is noted.  Minimal bilateral pleural effusions are noted with adjacent subsegmental atelectasis.   Past Medical History:  Diagnosis Date  . Arthritis   . Cancer ([place])   . Diabetes mellitus without complication ([place])   . Hypertension    No family history on file. Past Surgical History:  Procedure Laterality Date  . HERNIA REPAIR    . LIVER LOBECTOMY     Patient Active Problem List   Diagnosis Date Noted  . Pericardial effusion 04/02/2019      Prior to Admission medications   Medication Sig Start Date End Date Taking? Authorizing Provider  acetaminophen (TYLENOL) 325 MG tablet Take 650 mg by mouth every 6 (six) hours as needed for mild pain or fever.    [provider]  Artificial Tear Ointment (AKWA TEARS) 01-06-82 % OINT Place 1 drop into the left eye 2 (two) times daily as needed for dry eyes.  03/19/19 04/18/19  [provider]  carboxymethylcellulose (REFRESH PLUS) 0.5 % SOLN Place 2 drops into the left eye 3 (three) times daily.    [provider]  colchicine 0.6 MG tablet Take 1 tablet (0.6 mg total) by mouth 2 (two) times daily. 04/05/19   Gladstone Lighter, MD  dicyclomine (BENTYL) 10 MG capsule Take 10 mg by mouth 4 (four) times daily.     [provider]  DULoxetine (CYMBALTA) 30 MG capsule Take 1 capsule (30 mg total) by mouth daily. 04/05/19 07/04/19  Gladstone Lighter, MD  gabapentin (NEURONTIN) 300 MG capsule Take 1 capsule (300 mg total) by mouth 3 (three) times daily. 04/05/19   Gladstone Lighter, MD  insulin glargine (LANTUS) 100 UNIT/ML injection Inject 40 Units into the skin 2 (two) times daily.     [provider]  insulin lispro (HUMALOG) 100 UNIT/ML injection Inject 10 Units into the skin 3 (three) times daily before meals.     [provider]  metoCLOPramide (REGLAN) 10 MG tablet Take 10 mg by mouth 4 (four) times daily -  before meals and at bedtime.     [provider]  neomycin-polymyxin-hydrocortisone (CORTISPORIN) 3.5-10000-1 ophthalmic suspension Place 1 drop into the left eye every 6 (six) hours. 03/19/19 04/18/19  [provider]  oxyCODONE-acetaminophen (PERCOCET) 7.5-325 MG tablet Take 2 tablets by mouth every 6 (six) hours as needed for severe pain. 04/05/19   Gladstone Lighter, MD  pantoprazole (PROTONIX) 40 MG  tablet Take 40 mg by mouth 2 (two) times daily.     [provider]  sucralfate (CARAFATE) 1 g tablet Take 1 g by mouth 4 (four) times daily.     [provider]  tamsulosin (FLOMAX) 0.4 MG CAPS capsule Take 1 capsule (0.4 mg total) by mouth daily. 04/05/19   Gladstone Lighter, MD    Allergies Metformin and related and Motrin [ibuprofen]    Social History Social History   Tobacco Use  . Smoking status: Former Research scientist (life sciences)  . Smokeless tobacco: Never Used  Substance Use  Topics  . Alcohol use: Not Currently  . Drug use: Yes    Frequency: 0.5 times per week    Types: Marijuana    Review of Systems Patient denies headaches, rhinorrhea, blurry vision, numbness, shortness of breath, chest pain, edema, cough, abdominal pain, nausea, vomiting, diarrhea, dysuria, fevers, rashes or hallucinations unless otherwise stated above in HPI. ____________________________________________   PHYSICAL EXAM:  VITAL SIGNS: Vitals:   04/08/19 0649  BP: (!) 142/97  Pulse: (!) 120  Resp: 20  Temp: 98.9 F (37.2 C)  SpO2: 94%    Constitutional: Alert and oriented. Disheveled appearing Eyes: Conjunctivae are normal.  Head: Atraumatic. Nose: No congestion/rhinnorhea. Mouth/Throat: Mucous membranes are moist.   Neck: No stridor. Painless ROM.  Cardiovascular: tachycardic rate, regular rhythm. Grossly normal heart sounds.  Good peripheral circulation. Respiratory: Normal respiratory effort.  No retractions. Lungs CTAB. Gastrointestinal: Soft and nontender. No distention. No abdominal bruits. No CVA tenderness. Genitourinary: deferred Musculoskeletal: No lower extremity tenderness nor edema.  No joint effusions. Neurologic:  Normal speech and language. No gross focal neurologic deficits are appreciated. No facial droop Skin:  Skin is warm, dry and intact. No rash noted. Psychiatric: Mood and affect are anxious. Speech and behavior are normal.  ____________________________________________   LABS (all labs ordered are listed, but only abnormal results are displayed)  Results for orders placed or performed during the hospital encounter of 04/08/19 (from the past 24 hour(s))  Basic metabolic panel     Status: Abnormal   Collection Time: 04/08/19  6:52 AM  Result Value Ref Range   Sodium 132 (L) 135 - 145 mmol/L   Potassium 4.1 3.5 - 5.1 mmol/L   Chloride 97 (L) 98 - 111 mmol/L   CO2 21 (L) 22 - 32 mmol/L   Glucose, Bld 156 (H) 70 - 99 mg/dL   BUN 9 6 - 20 mg/dL    Creatinine, Ser 0.53 (L) 0.61 - 1.24 mg/dL   Calcium 8.3 (L) 8.9 - 10.3 mg/dL   GFR calc non Af Amer >60 >60 mL/min   GFR calc Af Amer >60 >60 mL/min   Anion gap 14 5 - 15  CBC     Status: Abnormal   Collection Time: 04/08/19  6:52 AM  Result Value Ref Range   WBC 10.5 4.0 - 10.5 K/uL   RBC 3.35 (L) 4.22 - 5.81 MIL/uL   Hemoglobin 9.4 (L) 13.0 - 17.0 g/dL   HCT 28.8 (L) 39.0 - 52.0 %   MCV 86.0 80.0 - 100.0 fL   MCH 28.1 26.0 - 34.0 pg   MCHC 32.6 30.0 - 36.0 g/dL   RDW 14.6 11.5 - 15.5 %   Platelets 369 150 - 400 K/uL   nRBC 0.0 0.0 - 0.2 %  Troponin I - ONCE - STAT     Status: None   Collection Time: 04/08/19  6:52 AM  Result Value Ref Range   Troponin I <  0.03 <0.03 ng/mL  Lipase, blood     Status: None   Collection Time: 04/08/19  6:52 AM  Result Value Ref Range   Lipase 18 11 - 51 U/L  Hepatic function panel     Status: Abnormal   Collection Time: 04/08/19  6:52 AM  Result Value Ref Range   Total Protein 7.6 6.5 - 8.1 g/dL   Albumin 3.0 (L) 3.5 - 5.0 g/dL   AST 25 15 - 41 U/L   ALT 29 0 - 44 U/L   Alkaline Phosphatase 144 (H) 38 - 126 U/L   Total Bilirubin 1.0 0.3 - 1.2 mg/dL   Bilirubin, Direct 0.3 (H) 0.0 - 0.2 mg/dL   Indirect Bilirubin 0.7 0.3 - 0.9 mg/dL  Brain natriuretic peptide     Status: Abnormal   Collection Time: 04/08/19  6:52 AM  Result Value Ref Range   B Natriuretic Peptide 184.0 (H) 0.0 - 100.0 pg/mL  SARS Coronavirus 2 (CEPHEID - Performed in [facility] hospital lab), Hosp Order     Status: None   Collection Time: 04/08/19  7:40 AM  Result Value Ref Range   SARS Coronavirus 2 NEGATIVE NEGATIVE  Urinalysis, Complete w Microscopic     Status: Abnormal   Collection Time: 04/08/19  8:17 AM  Result Value Ref Range   Color, Urine YELLOW (A) YELLOW   APPearance HAZY (A) CLEAR   Specific Gravity, Urine 1.016 1.005 - 1.030   pH 5.0 5.0 - 8.0   Glucose, UA NEGATIVE NEGATIVE mg/dL   Hgb urine dipstick NEGATIVE NEGATIVE   Bilirubin Urine NEGATIVE  NEGATIVE   Ketones, ur 20 (A) NEGATIVE mg/dL   Protein, ur 30 (A) NEGATIVE mg/dL   Nitrite NEGATIVE NEGATIVE   Leukocytes,Ua NEGATIVE NEGATIVE   RBC / HPF 0-5 0 - 5 RBC/hpf   WBC, UA 11-20 0 - 5 WBC/hpf   Bacteria, UA RARE (A) NONE SEEN   Squamous Epithelial / LPF 0-5 0 - 5   Mucus PRESENT   Urine Drug Screen, Qualitative ([facility] only)     Status: Abnormal   Collection Time: 04/08/19  8:17 AM  Result Value Ref Range   Tricyclic, Ur Screen NONE DETECTED NONE DETECTED   Amphetamines, Ur Screen NONE DETECTED NONE DETECTED   MDMA (Ecstasy)Ur Screen NONE DETECTED NONE DETECTED   Cocaine Metabolite,Ur [place] NONE DETECTED NONE DETECTED   Opiate, Ur Screen NONE DETECTED NONE DETECTED   Phencyclidine (PCP) Ur S NONE DETECTED NONE DETECTED   Cannabinoid 50 Ng, Ur [place] POSITIVE (A) NONE DETECTED   Barbiturates, Ur Screen NONE DETECTED NONE DETECTED   Benzodiazepine, Ur Scrn NONE DETECTED NONE DETECTED   Methadone Scn, Ur NONE DETECTED NONE DETECTED   ____________________________________________  EKG My review and personal interpretation at Time: 6:49   Indication: chest pain  Rate: 120  Rhythm: sinus Axis: normal Other: poor r wave progression, no stemi, nonspecific st abn, no significant changes as compared to previous ____________________________________________  RADIOLOGY  I personally reviewed all radiographic images ordered to evaluate for the above acute complaints and reviewed radiology reports and findings.  These findings were personally discussed with the patient.  Please see medical record for radiology report.  ____________________________________________   PROCEDURES  Procedure(s) performed:  .Critical Care Performed by: Merlyn Lot, MD Authorized by: Merlyn Lot, MD   Critical care provider statement:    Critical care time (minutes):  30   Critical care time was exclusive of:  Separately billable procedures and treating other patients  Critical care was  necessary to treat or prevent imminent or life-threatening deterioration of the following conditions:  Cardiac failure   Critical care was time spent personally by me on the following activities:  Development of treatment plan with patient or surrogate, discussions with consultants, evaluation of patient's response to treatment, examination of patient, obtaining history from patient or surrogate, ordering and performing treatments and interventions, ordering and review of laboratory studies, ordering and review of radiographic studies, pulse oximetry, re-evaluation of patient's condition and review of old charts      EMERGENCY DEPARTMENT [place] CARDIAC EXAM "Study: Limited Ultrasound of the Heart and Pericardium"  INDICATIONS:Abnormal vital signs and Chest pain Multiple views of the heart and pericardium were obtained in real-time with a multi-frequency probe.  PERFORMED VZ:DGLOVF IMAGES ARCHIVED?: No LIMITATIONS:  None VIEWS USED: Subcostal 4 chamber, Parasternal long axis and Parasternal short axis INTERPRETATION: Pericardial effusion present      Critical Care performed: yes ____________________________________________   INITIAL IMPRESSION / ASSESSMENT AND PLAN / ED COURSE  Pertinent labs & imaging results that were available during my care of the patient were reviewed by me and considered in my medical decision making (see chart for details).   DDX: ACS, pericarditis, esophagitis, boerhaaves, pe, dissection, pna, bronchitis, costochondritis   Kenneth Copeland is a 53 y.o. who presents to the ED with symptoms as described above.  Patient disheveled and uncomfortable appearing but no respiratory distress.  Does have mild tachycardia.  Bedside ultrasound does show recurrent pericardial effusion.  Unable to compare to previous imaging.  Doubt ACS but certainly concerning for pericarditis given effusion.  Will check blood work.  Possible component of drug-induced chest pain given his  history.  No fevers to doubt sepsis.  Clinical Course as of Apr 07 904  Sun Apr 08, 2019  0901 Patient with echo concerning for worsening pericardial effusion.  I discussed case with Dr. Alanda Amass order, agrees to consult on patient.  We will give IV fluids and the patient for further medical management.   [PR]    Clinical Course User Index [PR] Merlyn Lot, MD    The patient was evaluated in Emergency Department today for the symptoms described in the history of present illness. He/she was evaluated in the context of the global COVID-19 pandemic, which necessitated consideration that the patient might be at risk for infection with the SARS-CoV-2 virus that causes COVID-19. Institutional protocols and algorithms that pertain to the evaluation of patients at risk for COVID-19 are in a state of rapid change based on information released by regulatory bodies including the CDC and federal and state organizations. These policies and algorithms were followed during the patient's care in the ED.  As part of my medical decision making, I reviewed the following data within the [place] notes reviewed and incorporated, Labs reviewed, notes from prior ED visits and [place] Controlled Substance Database   ____________________________________________   FINAL CLINICAL IMPRESSION(S) / ED DIAGNOSES  Final diagnoses:  Chest pain, unspecified type  Pericardial effusion      NEW MEDICATIONS STARTED DURING THIS VISIT:  New Prescriptions   No medications on file     Note:  This document was prepared using Dragon voice recognition software and may include unintentional dictation errors.    Merlyn Lot, MD 04/08/19 [phone]

## 2019-04-08 NOTE — Care Management Obs Status (Signed)
[place] NOTIFICATION   Patient Details  Name: Donnell Wion MRN: [id number] Date of Birth: July 15, 1966   Medicare Observation Status Notification Given:  Yes    Murvin Gift A Symphani Eckstrom, RN 04/08/2019, 11:47 AM

## 2019-04-08 NOTE — H&P (Signed)
[facility] [place] NAME: Gianpaolo Mindel    MR#:  [id number]  DATE OF BIRTH:  06-12-1966  DATE OF ADMISSION:  04/08/2019  PRIMARY CARE PHYSICIAN: Patient, No Pcp Per   REQUESTING/REFERRING PHYSICIAN: Dr. Merlyn Lot.    CHIEF COMPLAINT:   Chief Complaint  Patient presents with  . Shortness of Breath  . Chest Pain    HISTORY OF PRESENT ILLNESS:  Ehren Berisha  is a 53 y.o. male with a known history of chronic pain, diabetes, hypertension, history of liver cancer, osteoarthritis, neuropathy who was just recently discharged from the hospital returns back due to worsening chest pain and shortness of breath.  Patient says his pain never really improved since he left the hospital a few days ago.  He describes the pain in the center of his chest nonradiating and associated some shortness of breath and chills.  He also admits to fever but it has not been documented.  He says since the pain was not improving despite taking his medications at home and therefore he came to the ER for further evaluation.  In the ER patient was not noted to be hypoxic or febrile.  He was noted to be tachycardic on his previous hospitalization he was noted to have a small pericardial effusion and given his tachycardia he underwent a repeat echocardiogram in the emergency room which showed worsening of his pericardial effusion but no tamponade physiology.  Given his worsening pericardial effusion the ER physician spoke to the cardiologist who recommended admission under observation for further evaluation.  PAST MEDICAL HISTORY:   Past Medical History:  Diagnosis Date  . Arthritis   . Cancer ([place])   . Diabetes mellitus without complication ([place])   . Hypertension   . Liver cancer ([place])     PAST SURGICAL HISTORY:   Past Surgical History:  Procedure Laterality Date  . HERNIA REPAIR    . LIVER LOBECTOMY      SOCIAL HISTORY:   Social History   Tobacco Use  .  Smoking status: Former Research scientist (life sciences)  . Smokeless tobacco: Never Used  Substance Use Topics  . Alcohol use: Not Currently    FAMILY HISTORY:   Family History  Family history unknown: Yes   DRUG ALLERGIES:   Allergies  Allergen Reactions  . Metformin And Related     Pt. States it makes his stomach bleed because of a stomach ulcer  . Motrin [Ibuprofen]     Pt. States it "inflames his stomach, causes bleeding in stomach"    REVIEW OF SYSTEMS:   Review of Systems  Constitutional: Positive for chills. Negative for fever and weight loss.  HENT: Negative for congestion, nosebleeds and tinnitus.   Eyes: Negative for blurred vision, double vision and redness.  Respiratory: Positive for shortness of breath. Negative for cough and hemoptysis.   Cardiovascular: Positive for chest pain. Negative for orthopnea, leg swelling and PND.  Gastrointestinal: Negative for abdominal pain, diarrhea, melena, nausea and vomiting.  Genitourinary: Negative for dysuria, hematuria and urgency.  Musculoskeletal: Negative for falls and joint pain.  Neurological: Negative for dizziness, tingling, sensory change, focal weakness, seizures, weakness and headaches.  Endo/Heme/Allergies: Negative for polydipsia. Does not bruise/bleed easily.  Psychiatric/Behavioral: Negative for depression and memory loss. The patient is not nervous/anxious.     MEDICATIONS AT HOME:   Prior to Admission medications   Medication Sig Start Date End Date Taking? Authorizing Provider  acetaminophen (TYLENOL) 325 MG tablet Take 650 mg  by mouth every 6 (six) hours as needed for mild pain or fever.   Yes [provider]  Artificial Tear Ointment (AKWA TEARS) 01-06-82 % OINT Place 1 drop into the left eye 2 (two) times daily as needed for dry eyes. 03/19/19 04/18/19 Yes [provider]  carboxymethylcellulose (REFRESH PLUS) 0.5 % SOLN Place 2 drops into the left eye 3 (three) times daily.   Yes [provider]   colchicine 0.6 MG tablet Take 1 tablet (0.6 mg total) by mouth 2 (two) times daily. 04/05/19  Yes Gladstone Lighter, MD  dicyclomine (BENTYL) 10 MG capsule Take 10 mg by mouth 4 (four) times daily.    Yes [provider]  DULoxetine (CYMBALTA) 30 MG capsule Take 1 capsule (30 mg total) by mouth daily. 04/05/19 07/04/19 Yes Gladstone Lighter, MD  gabapentin (NEURONTIN) 300 MG capsule Take 1 capsule (300 mg total) by mouth 3 (three) times daily. 04/05/19  Yes Gladstone Lighter, MD  insulin glargine (LANTUS) 100 UNIT/ML injection Inject 40 Units into the skin 2 (two) times daily.    Yes [provider]  insulin lispro (HUMALOG) 100 UNIT/ML injection Inject 10 Units into the skin 3 (three) times daily before meals.    Yes [provider]  metoCLOPramide (REGLAN) 10 MG tablet Take 10 mg by mouth 4 (four) times daily -  before meals and at bedtime.    Yes [provider]  neomycin-polymyxin-hydrocortisone (CORTISPORIN) 3.5-10000-1 ophthalmic suspension Place 1 drop into the left eye every 6 (six) hours. 03/19/19 04/18/19 Yes [provider]  oxyCODONE-acetaminophen (PERCOCET) 7.5-325 MG tablet Take 2 tablets by mouth every 6 (six) hours as needed for severe pain. 04/05/19  Yes Gladstone Lighter, MD  pantoprazole (PROTONIX) 40 MG tablet Take 40 mg by mouth 2 (two) times daily.    Yes [provider]  sucralfate (CARAFATE) 1 g tablet Take 1 g by mouth 4 (four) times daily.    Yes [provider]  tamsulosin (FLOMAX) 0.4 MG CAPS capsule Take 1 capsule (0.4 mg total) by mouth daily. 04/05/19  Yes Gladstone Lighter, MD      VITAL SIGNS:  Blood pressure (!) 130/103, pulse (!) 117, temperature 98.9 F (37.2 C), temperature source Oral, resp. rate 19, height 5\' 11"  (1.803 m), weight 81.6 kg, SpO2 94 %.  PHYSICAL EXAMINATION:  Physical Exam  GENERAL:  52 y.o.-year-old unkempt patient lying in the bed in mild distress.  EYES: Pupils equal, round,  reactive to light and accommodation. No scleral icterus. Extraocular muscles intact.  HEENT: Head atraumatic, normocephalic. Oropharynx and nasopharynx clear. No oropharyngeal erythema, moist oral mucosa  NECK:  Supple, no jugular venous distention. No thyroid enlargement, no tenderness.  LUNGS: Normal breath sounds bilaterally, no wheezing, rales, rhonchi. No use of accessory muscles of respiration.  CARDIOVASCULAR: S1, S2 RRR. No murmurs, rubs, gallops, clicks.  ABDOMEN: Soft, nontender, nondistended. Bowel sounds present. No organomegaly or mass.  EXTREMITIES: No pedal edema, cyanosis, or clubbing. + 2 pedal & radial pulses b/l.   NEUROLOGIC: Cranial nerves II through XII are intact. No focal Motor or sensory deficits appreciated b/l PSYCHIATRIC: The patient is alert and oriented x 3. Anxious SKIN: No obvious rash, lesion, or ulcer.   LABORATORY PANEL:   CBC Recent Labs  Lab 04/08/19 0652  WBC 10.5  HGB 9.4*  HCT 28.8*  PLT 369   ------------------------------------------------------------------------------------------------------------------  Chemistries  Recent Labs  Lab 04/08/19 0652  NA 132*  K 4.1  CL 97*  CO2 21*  GLUCOSE 156*  BUN 9  CREATININE 0.53*  CALCIUM 8.3*  AST 25  ALT 29  ALKPHOS 144*  BILITOT 1.0   ------------------------------------------------------------------------------------------------------------------  Cardiac Enzymes Recent Labs  Lab 04/08/19 0652  TROPONINI <0.03   ------------------------------------------------------------------------------------------------------------------  RADIOLOGY:  Dg Chest Portable 1 View  Result Date: 04/08/2019 CLINICAL DATA:  Weakness, pericardial effusion, history of liver cancer EXAM: PORTABLE CHEST 1 VIEW COMPARISON:  CTA chest dated 04/02/2019 FINDINGS: Mild bibasilar atelectasis with trace bilateral pleural effusions. No frank interstitial edema. No pneumothorax. Enlargement of the cardiac  silhouette, corresponding to known pericardial effusion. IMPRESSION: Trace bilateral pleural effusions. Known pericardial effusion. Electronically Signed   By: Julian Hy M.D.   On: 04/08/2019 07:21     IMPRESSION AND PLAN:   53 year old male with past medical history of liver cancer, osteoarthritis, diabetes, chronic pain, neuropathy who was just discharged from the hospital due to suspected sepsis and noted to have a trivial pericardial effusion now returns back due to chest pain and shortness of breath.  1.  Chest pain/shortness of breath- etiology unclear but suspected to be secondary to either musculoskeletal or related to his worsening pericardial effusion. - EKG shows low voltage throughout the precordial leads consistent with a pericardial effusion but no acute ST changes consistent with ischemia. -We will observe on telemetry, cycle his markers. - We will get a cardiology consult, echocardiogram showing worsening of his pericardial effusion no tamponade physiology. -Continue supportive care with pain control for now.  2.  Pericardial effusion-etiology unclear.  Patient was recently admitted to the hospital underwent echocardiogram which showed trivial pericardial effusion and now returns back with worsening chest pain and shortness of breath and a bedside echo done in the ER showing worsening effusion. - Patient does have a history of liver malignancy but is unlikely that his protocol effusion is malignant and gotten worse in 5 days. - Await further cardiology evaluation, continue supportive care with IV fluids and pain control for now.  3.  Chronic pain/neuropathy-patient has radiculopathy in the cervical spine and has chronic pain and neuropathy related to this. -Continue his gabapentin, Cymbalta and Percocet for now.  4.  Diabetes type 2 without complication-I will place him on sliding scale insulin for now.    5.  GERD-continue Protonix.    6.  BPH-continue Flomax.  7.   History of gout-no acute attack.  Continue colchicine.    All the records are reviewed and case discussed with ED provider. Management plans discussed with the patient, family and they are in agreement.  CODE STATUS: Full code  TOTAL TIME TAKING CARE OF THIS PATIENT: 45 minutes.    Henreitta Leber M.D on 04/08/2019 at 9:58 AM  Between 7am to 6pm - Pager - [phone]  After 6pm go to [email or web address] - password [email or web address] [facility]  Office  [phone]  CC: Primary care physician; Patient, No Pcp Per

## 2019-04-09 ENCOUNTER — Observation Stay: Payer: Self-pay

## 2019-04-09 ENCOUNTER — Encounter
Admission: EM | Disposition: A | Discharge status: Skilled Nursing Facility | Payer: Self-pay | Source: Home / Self Care | Attending: Internal Medicine

## 2019-04-09 ENCOUNTER — Encounter: Payer: Self-pay | Admitting: Anesthesiology

## 2019-04-09 DIAGNOSIS — R570 Cardiogenic shock: Secondary | ICD-10-CM | POA: Diagnosis present

## 2019-04-09 DIAGNOSIS — I480 Paroxysmal atrial fibrillation: Secondary | ICD-10-CM | POA: Diagnosis not present

## 2019-04-09 DIAGNOSIS — I313 Pericardial effusion (noninflammatory): Secondary | ICD-10-CM | POA: Diagnosis present

## 2019-04-09 DIAGNOSIS — R7881 Bacteremia: Secondary | ICD-10-CM | POA: Diagnosis not present

## 2019-04-09 DIAGNOSIS — J9601 Acute respiratory failure with hypoxia: Secondary | ICD-10-CM

## 2019-04-09 DIAGNOSIS — D509 Iron deficiency anemia, unspecified: Secondary | ICD-10-CM | POA: Diagnosis not present

## 2019-04-09 DIAGNOSIS — R571 Hypovolemic shock: Secondary | ICD-10-CM | POA: Diagnosis present

## 2019-04-09 DIAGNOSIS — N4289 Other specified disorders of prostate: Secondary | ICD-10-CM | POA: Diagnosis present

## 2019-04-09 DIAGNOSIS — M25512 Pain in left shoulder: Secondary | ICD-10-CM | POA: Diagnosis present

## 2019-04-09 DIAGNOSIS — D649 Anemia, unspecified: Secondary | ICD-10-CM | POA: Diagnosis present

## 2019-04-09 DIAGNOSIS — K6289 Other specified diseases of anus and rectum: Secondary | ICD-10-CM | POA: Diagnosis present

## 2019-04-09 DIAGNOSIS — K219 Gastro-esophageal reflux disease without esophagitis: Secondary | ICD-10-CM | POA: Diagnosis present

## 2019-04-09 DIAGNOSIS — Z20828 Contact with and (suspected) exposure to other viral communicable diseases: Secondary | ICD-10-CM | POA: Diagnosis present

## 2019-04-09 DIAGNOSIS — K3184 Gastroparesis: Secondary | ICD-10-CM | POA: Diagnosis present

## 2019-04-09 DIAGNOSIS — Z23 Encounter for immunization: Secondary | ICD-10-CM | POA: Diagnosis present

## 2019-04-09 DIAGNOSIS — I959 Hypotension, unspecified: Secondary | ICD-10-CM | POA: Diagnosis present

## 2019-04-09 DIAGNOSIS — B9562 Methicillin resistant Staphylococcus aureus infection as the cause of diseases classified elsewhere: Secondary | ICD-10-CM | POA: Diagnosis not present

## 2019-04-09 DIAGNOSIS — I471 Supraventricular tachycardia: Secondary | ICD-10-CM | POA: Diagnosis not present

## 2019-04-09 DIAGNOSIS — R6521 Severe sepsis with septic shock: Secondary | ICD-10-CM | POA: Diagnosis present

## 2019-04-09 DIAGNOSIS — E871 Hypo-osmolality and hyponatremia: Secondary | ICD-10-CM | POA: Diagnosis not present

## 2019-04-09 DIAGNOSIS — I82891 Chronic embolism and thrombosis of other specified veins: Secondary | ICD-10-CM | POA: Diagnosis present

## 2019-04-09 DIAGNOSIS — C220 Liver cell carcinoma: Secondary | ICD-10-CM | POA: Diagnosis not present

## 2019-04-09 DIAGNOSIS — J441 Chronic obstructive pulmonary disease with (acute) exacerbation: Secondary | ICD-10-CM | POA: Diagnosis present

## 2019-04-09 DIAGNOSIS — E1143 Type 2 diabetes mellitus with diabetic autonomic (poly)neuropathy: Secondary | ICD-10-CM | POA: Diagnosis present

## 2019-04-09 DIAGNOSIS — J90 Pleural effusion, not elsewhere classified: Secondary | ICD-10-CM | POA: Diagnosis present

## 2019-04-09 DIAGNOSIS — N179 Acute kidney failure, unspecified: Secondary | ICD-10-CM | POA: Diagnosis present

## 2019-04-09 DIAGNOSIS — A4102 Sepsis due to Methicillin resistant Staphylococcus aureus: Secondary | ICD-10-CM | POA: Diagnosis present

## 2019-04-09 DIAGNOSIS — N400 Enlarged prostate without lower urinary tract symptoms: Secondary | ICD-10-CM | POA: Diagnosis present

## 2019-04-09 DIAGNOSIS — I82890 Acute embolism and thrombosis of other specified veins: Secondary | ICD-10-CM | POA: Diagnosis present

## 2019-04-09 LAB — CBC
HCT: 25.3 % — ABNORMAL LOW (ref 39.0–52.0)
Hemoglobin: 8 g/dL — ABNORMAL LOW (ref 13.0–17.0)
MCH: 27.5 pg (ref 26.0–34.0)
MCHC: 31.6 g/dL (ref 30.0–36.0)
MCV: 86.9 fL (ref 80.0–100.0)
Platelets: 364 10*3/uL (ref 150–400)
RBC: 2.91 MIL/uL — ABNORMAL LOW (ref 4.22–5.81)
RDW: 14.9 % (ref 11.5–15.5)
WBC: 12.7 10*3/uL — ABNORMAL HIGH (ref 4.0–10.5)
nRBC: 0 % (ref 0.0–0.2)

## 2019-04-09 LAB — BASIC METABOLIC PANEL
Anion gap: 12 (ref 5–15)
BUN: 12 mg/dL (ref 6–20)
CO2: 22 mmol/L (ref 22–32)
Calcium: 7.8 mg/dL — ABNORMAL LOW (ref 8.9–10.3)
Chloride: 96 mmol/L — ABNORMAL LOW (ref 98–111)
Creatinine, Ser: 0.58 mg/dL — ABNORMAL LOW (ref 0.61–1.24)
GFR calc Af Amer: >60 mL/min (ref >60)
GFR calc non Af Amer: >60 mL/min (ref >60)
Glucose, Bld: 285 mg/dL — ABNORMAL HIGH (ref 70–99)
Potassium: 3.9 mmol/L (ref 3.5–5.1)
Sodium: 130 mmol/L — ABNORMAL LOW (ref 135–145)

## 2019-04-09 LAB — GLUCOSE, CAPILLARY
Glucose-Capillary: 285 mg/dL — ABNORMAL HIGH (ref 70–99)
Glucose-Capillary: 296 mg/dL — ABNORMAL HIGH (ref 70–99)
Glucose-Capillary: 308 mg/dL — ABNORMAL HIGH (ref 70–99)
Glucose-Capillary: 308 mg/dL — ABNORMAL HIGH (ref 70–99)
Glucose-Capillary: 309 mg/dL — ABNORMAL HIGH (ref 70–99)
Glucose-Capillary: 368 mg/dL — ABNORMAL HIGH (ref 70–99)

## 2019-04-09 LAB — PROTIME-INR
INR: 1.5 — ABNORMAL HIGH (ref 0.8–1.2)
Prothrombin Time: 17.9 seconds — ABNORMAL HIGH (ref 11.4–15.2)

## 2019-04-09 LAB — MRSA PCR SCREENING: MRSA by PCR: NEGATIVE

## 2019-04-09 LAB — MAGNESIUM: Magnesium: 1.9 mg/dL (ref 1.7–2.4)

## 2019-04-09 LAB — PHOSPHORUS: Phosphorus: 2.9 mg/dL (ref 2.5–4.6)

## 2019-04-09 LAB — APTT: aPTT: 37 seconds — ABNORMAL HIGH (ref 24–36)

## 2019-04-09 LAB — ABO/RH: ABO/RH(D): A POS

## 2019-04-09 LAB — PROCALCITONIN: Procalcitonin: 1.15 ng/mL

## 2019-04-09 SURGERY — DRAINAGE, PERICARDIAL EFFUSION
Anesthesia: General | Laterality: Left

## 2019-04-09 MED ORDER — AMIODARONE LOAD VIA INFUSION
150.0000 mg | Freq: Once | INTRAVENOUS | Status: DC
  Filled 2019-04-09: qty 83.34

## 2019-04-09 MED ORDER — AMIODARONE IV BOLUS ONLY 150 MG/100ML
INTRAVENOUS | Status: AC
  Administered 2019-04-09: 150 mg
  Filled 2019-04-09: qty 100

## 2019-04-09 MED ORDER — AMIODARONE HCL IN DEXTROSE 360-4.14 MG/200ML-% IV SOLN
30.0000 mg/h | INTRAVENOUS | Status: DC
  Administered 2019-04-10 – 2019-04-11 (×3): 30 mg/h via INTRAVENOUS
  Filled 2019-04-09 (×3): qty 200

## 2019-04-09 MED ORDER — CEFAZOLIN SODIUM-DEXTROSE 2-4 GM/100ML-% IV SOLN
2.0000 g | INTRAVENOUS | Status: DC
  Filled 2019-04-09: qty 100

## 2019-04-09 MED ORDER — DEXTROSE 5 % IV SOLN: 60.0000 mg/h | INTRAVENOUS | Status: DC

## 2019-04-09 MED ORDER — DEXTROSE 5 % IV SOLN
30.0000 mg/h | INTRAVENOUS | Status: DC
  Filled 2019-04-09: qty 9

## 2019-04-09 MED ORDER — MIDAZOLAM HCL 2 MG/2ML IJ SOLN: 4.0000 mg | Freq: Once | INTRAMUSCULAR | Status: DC

## 2019-04-09 MED ORDER — FENTANYL CITRATE (PF) 100 MCG/2ML IJ SOLN: 200.0000 ug | Freq: Once | INTRAMUSCULAR | Status: DC

## 2019-04-09 MED ORDER — AMIODARONE HCL IN DEXTROSE 360-4.14 MG/200ML-% IV SOLN
60.0000 mg/h | INTRAVENOUS | Status: AC
  Administered 2019-04-09 (×2): 60 mg/h via INTRAVENOUS
  Filled 2019-04-09: qty 200

## 2019-04-09 MED ORDER — DIAZEPAM 2 MG PO TABS
5.0000 mg | ORAL_TABLET | Freq: Once | ORAL | Status: AC
  Administered 2019-04-09: 5 mg via ORAL
  Filled 2019-04-09: qty 3

## 2019-04-09 MED ORDER — INSULIN GLARGINE 100 UNIT/ML ~~LOC~~ SOLN
20.0000 [IU] | Freq: Every day | SUBCUTANEOUS | Status: DC
  Administered 2019-04-09 – 2019-04-10 (×2): 20 [IU] via SUBCUTANEOUS
  Filled 2019-04-09 (×3): qty 0.2

## 2019-04-09 MED ORDER — VECURONIUM BROMIDE 10 MG IV SOLR: 20.0000 mg | Freq: Once | INTRAVENOUS | Status: DC

## 2019-04-09 MED ORDER — IPRATROPIUM-ALBUTEROL 0.5-2.5 (3) MG/3ML IN SOLN
3.0000 mL | RESPIRATORY_TRACT | Status: DC
  Administered 2019-04-09 – 2019-04-10 (×5): 3 mL via RESPIRATORY_TRACT
  Filled 2019-04-09 (×5): qty 3

## 2019-04-09 MED ORDER — DEXTROSE 5 % IV SOLN
60.0000 mg/h | INTRAVENOUS | Status: DC
  Administered 2019-04-09: 13:00:00 60 mg/h via INTRAVENOUS
  Filled 2019-04-09: qty 9

## 2019-04-09 MED ORDER — LACTATED RINGERS IV BOLUS
1000.0000 mL | Freq: Once | INTRAVENOUS | Status: AC
  Administered 2019-04-09: 1000 mL via INTRAVENOUS

## 2019-04-09 MED ORDER — DEXTROSE 5 % IV SOLN: 30.0000 mg/h | INTRAVENOUS | Status: DC

## 2019-04-09 MED ORDER — SODIUM CHLORIDE 0.9% FLUSH
10.0000 mL | INTRAVENOUS | Status: DC | PRN
  Administered 2019-04-25: 10 mL
  Filled 2019-04-09: qty 40

## 2019-04-09 MED ORDER — INSULIN ASPART 100 UNIT/ML ~~LOC~~ SOLN
0.0000 [IU] | SUBCUTANEOUS | Status: DC
  Administered 2019-04-09: 11 [IU] via SUBCUTANEOUS
  Administered 2019-04-09: 8 [IU] via SUBCUTANEOUS
  Administered 2019-04-09: 15 [IU] via SUBCUTANEOUS
  Administered 2019-04-09: 11 [IU] via SUBCUTANEOUS
  Administered 2019-04-10: 15 [IU] via SUBCUTANEOUS
  Administered 2019-04-10 (×2): 11 [IU] via SUBCUTANEOUS
  Filled 2019-04-09 (×7): qty 1

## 2019-04-09 MED ORDER — AMIODARONE HCL IN DEXTROSE 360-4.14 MG/200ML-% IV SOLN
INTRAVENOUS | Status: AC
  Filled 2019-04-09: qty 200

## 2019-04-09 MED ORDER — SODIUM CHLORIDE 0.9% FLUSH
10.0000 mL | Freq: Two times a day (BID) | INTRAVENOUS | Status: DC
  Administered 2019-04-09 – 2019-04-12 (×8): 10 mL
  Administered 2019-04-13: 30 mL
  Administered 2019-04-13: 10 mL
  Administered 2019-04-14: 30 mL
  Administered 2019-04-14 – 2019-04-16 (×5): 10 mL
  Administered 2019-04-17: 30 mL
  Administered 2019-04-17 – 2019-04-25 (×14): 10 mL

## 2019-04-09 MED ORDER — METHYLPREDNISOLONE SODIUM SUCC 40 MG IJ SOLR
20.0000 mg | Freq: Two times a day (BID) | INTRAMUSCULAR | Status: DC
  Administered 2019-04-09 – 2019-04-10 (×3): 20 mg via INTRAVENOUS
  Filled 2019-04-09 (×3): qty 1

## 2019-04-09 SURGICAL SUPPLY — 34 items
BLADE SURG 15 STRL LF DISP TIS (BLADE) ×1 IMPLANT
BLADE SURG 15 STRL SS (BLADE) ×1
CANISTER SUCT 1200ML W/VALVE (MISCELLANEOUS) ×2 IMPLANT
CATH URET ROBINSON 16FR STRL (CATHETERS) ×2 IMPLANT
CHLORAPREP W/TINT 26 (MISCELLANEOUS) ×2 IMPLANT
CNTNR SPEC 2.5X3XGRAD LEK (MISCELLANEOUS) ×2
CONT SPEC 4OZ STER OR WHT (MISCELLANEOUS) ×2
CONTAINER SPEC 2.5X3XGRAD LEK (MISCELLANEOUS) ×2 IMPLANT
DRAIN CHEST DRY SUCT SGL (MISCELLANEOUS) ×2 IMPLANT
DRAPE CHEST BREAST 77X106 FENE (MISCELLANEOUS) ×2 IMPLANT
DRAPE INCISE IOBAN 66X45 STRL (DRAPES) ×2 IMPLANT
DRAPE LAPAROTOMY TRNSV 106X77 (MISCELLANEOUS) ×2 IMPLANT
DRAPE MAG INST 16X20 L/F (DRAPES) ×2 IMPLANT
DRSG TEGADERM 4X4.75 (GAUZE/BANDAGES/DRESSINGS) ×2 IMPLANT
ELECT BLADE 6 FLAT ULTRCLN (ELECTRODE) ×2 IMPLANT
ELECT REM PT RETURN 9FT ADLT (ELECTROSURGICAL) ×2
ELECTRODE REM PT RTRN 9FT ADLT (ELECTROSURGICAL) ×1 IMPLANT
GAUZE SPONGE 4X4 12PLY STRL (GAUZE/BANDAGES/DRESSINGS) ×2 IMPLANT
GLOVE SURG SYN 7.5  E (GLOVE) ×1
GLOVE SURG SYN 7.5 E (GLOVE) ×1 IMPLANT
GOWN STRL REUS W/ TWL LRG LVL3 (GOWN DISPOSABLE) ×3 IMPLANT
GOWN STRL REUS W/TWL LRG LVL3 (GOWN DISPOSABLE) ×3
HANDLE YANKAUER SUCT BULB TIP (MISCELLANEOUS) ×2 IMPLANT
KIT TURNOVER KIT A (KITS) ×2 IMPLANT
LABEL OR SOLS (LABEL) ×2 IMPLANT
NS IRRIG 1000ML POUR BTL (IV SOLUTION) ×2 IMPLANT
PACK BASIN MINOR ARMC (MISCELLANEOUS) ×2 IMPLANT
SPONGE KITTNER 5P (MISCELLANEOUS) ×2 IMPLANT
STRIP CLOSURE SKIN 1/2X4 (GAUZE/BANDAGES/DRESSINGS) ×2 IMPLANT
SUT VIC AB 0 CT2 27 (SUTURE) ×4 IMPLANT
SUT VIC AB 2-0 CT2 27 (SUTURE) ×4 IMPLANT
SUT VIC AB 4-0 FS2 27 (SUTURE) ×2 IMPLANT
SWABSTK COMLB BENZOIN TINCTURE (MISCELLANEOUS) ×2 IMPLANT
SYR 20CC LL (SYRINGE) ×2 IMPLANT

## 2019-04-09 NOTE — Progress Notes (Signed)
Inpatient Diabetes Program Recommendations  AACE/ADA: New Consensus Statement on Inpatient Glycemic Control   Target Ranges:  Prepandial:   less than 140 mg/dL      Peak postprandial:   less than 180 mg/dL (1-2 hours)      Critically ill patients:  140 - 180 mg/dL  Results for STEPHONE, GUM (MRN [id number]) as of 04/09/2019 09:45  Ref. Range 04/08/2019 13:30 04/08/2019 17:08 04/08/2019 22:13 04/09/2019 07:34 04/09/2019 08:40  Glucose-Capillary Latest Ref Range: 70 - 99 mg/dL 164 (H) 150 (H) 212 (H) 308 (H) 296 (H)    Review of Glycemic Control  Diabetes history: DM2 Outpatient Diabetes medications: Lantus 40 units BID, Humalog 10 units TID with meals plus additional units for correction when needed Current orders for Inpatient glycemic control: Novolog 0-9 units TID with meals, Novolog 0-5 units QHS  Inpatient Diabetes Program Recommendations:   Insulin-Basal: Please consider ordering Lantus 20 units Q24H.  NOTE: Patient was an inpatient from 04/02/19 to 04/05/19 and was ordered Lantus 45 units BID, Novolog 10 units TID with meals, and Novolog 0-15 units AC&HS for correction during that admission.  Diabetes Coordinator spoke with patient on 04/03/19 regarding DM and outpatient insulin regimen. No basal insulin has been given since admitted on 04/08/19 and fasting glucose 296 mg/dl today. Recommend ordering Lantus 20 units Q24H and follow glycemic trends as patient will likely require more basal insulin.   Thanks, Barnie Alderman, RN, MSN, CDE Diabetes Coordinator Inpatient Diabetes Program [phone] (Team Pager from 8am to 5pm)

## 2019-04-09 NOTE — Anesthesia Preprocedure Evaluation (Deleted)
Anesthesia Evaluation  Patient identified by MRN, date of birth, ID band Patient awake    Reviewed: Allergy & Precautions, H&P , NPO status , Patient's Chart, lab work & pertinent test results  Airway        Dental   Pulmonary shortness of breath, former smoker,           Cardiovascular hypertension, + angina + dysrhythmias      Neuro/Psych negative neurological ROS  negative psych ROS   GI/Hepatic (+) Cirrhosis     substance abuse  alcohol use, H/o liver cancer s/p partial hepatectomy   Endo/Other  negative endocrine ROSdiabetes  Renal/GU      Musculoskeletal   Abdominal   Peds  Hematology negative hematology ROS (+)   Anesthesia Other Findings Presented with CP/SOB, found to have moderate pericardial effusion with normal diastolic and systolic function on TTE yesterday. Pt's HR as high as 170's today with intermittent hypotension.  Plan is for bedside TTE in OR prior to induction of anesthesia with needle paracentesis if necessary, followed by pericardial window under GA.  Past Medical History: No date: Arthritis No date: Cancer ([place]) No date: Diabetes mellitus without complication (HCC) No date: Hypertension No date: Liver cancer [facility])  Past Surgical History: No date: HERNIA REPAIR No date: LIVER LOBECTOMY  BMI    Body Mass Index:  26.20 kg/m      Reproductive/Obstetrics negative OB ROS                            Anesthesia Physical Anesthesia Plan  ASA: IV  Anesthesia Plan: General ETT   Post-op Pain Management:    Induction:   PONV Risk Score and Plan: Ondansetron, Dexamethasone, Midazolam and Treatment may vary due to age or medical condition  Airway Management Planned:   Additional Equipment:   Intra-op Plan:   Post-operative Plan:   Informed Consent: I have reviewed the patients History and Physical, chart, labs and discussed the procedure including the  risks, benefits and alternatives for the proposed anesthesia with the patient or authorized representative who has indicated his/her understanding and acceptance.     Dental Advisory Given  Plan Discussed with: Anesthesiologist and CRNA  Anesthesia Plan Comments:         Anesthesia Quick Evaluation

## 2019-04-09 NOTE — Progress Notes (Signed)
[facility] [place] NAME: Kenneth Copeland    MR#:  [id number]  DATE OF BIRTH:  11-Mar-1966  SUBJECTIVE:   Patient presented to the hospital yesterday with atypical chest pain and suspected to have Worsening Pericardial Effusion.   This a.m. pt. Got quite hypotensive, tachy, and hypoxic and was transferred to ICU/Stepdown.   REVIEW OF SYSTEMS:    Review of Systems  Constitutional: Negative for chills and fever.  HENT: Negative for congestion and tinnitus.   Eyes: Negative for blurred vision and double vision.  Respiratory: Positive for shortness of breath. Negative for cough and wheezing.   Cardiovascular: Positive for chest pain. Negative for orthopnea and PND.  Gastrointestinal: Negative for abdominal pain, diarrhea, nausea and vomiting.  Genitourinary: Negative for dysuria and hematuria.  Neurological: Negative for dizziness, sensory change and focal weakness.  All other systems reviewed and are negative.   Nutrition: Heart Healthy Tolerating Diet: Yes Tolerating PT: Await Eval.   DRUG ALLERGIES:   Allergies  Allergen Reactions  . Metformin And Related     Pt. States it makes his stomach bleed because of a stomach ulcer  . Motrin [Ibuprofen]     Pt. States it "inflames his stomach, causes bleeding in stomach"    VITALS:  Blood pressure 99/79, pulse (!) 57, temperature 98.4 F (36.9 C), temperature source Oral, resp. rate 16, height 5\' 11"  (1.803 m), weight 85.2 kg, SpO2 99 %.  PHYSICAL EXAMINATION:   Physical Exam  GENERAL:  53 y.o.-year-old patient lying in bed in some pain  But in NAD.  EYES: Pupils equal, round, reactive to light and accommodation. No scleral icterus. Extraocular muscles intact.  HEENT: Head atraumatic, normocephalic. Oropharynx and nasopharynx clear.  NECK:  Supple, no jugular venous distention. No thyroid enlargement, no tenderness.  LUNGS: Normal breath sounds bilaterally, no wheezing, rales,  rhonchi. No use of accessory muscles of respiration.  CARDIOVASCULAR: S1, S2 normal. No murmurs, rubs, or gallops.  ABDOMEN: Soft, nontender, nondistended. Bowel sounds present. No organomegaly or mass.  EXTREMITIES: No cyanosis, clubbing or edema b/l.    NEUROLOGIC: Cranial nerves II through XII are intact. No focal Motor or sensory deficits b/l.   PSYCHIATRIC: The patient is alert and oriented x 3.  SKIN: No obvious rash, lesion, or ulcer.    LABORATORY PANEL:   CBC Recent Labs  Lab 04/09/19 0520  WBC 12.7*  HGB 8.0*  HCT 25.3*  PLT 364   ------------------------------------------------------------------------------------------------------------------  Chemistries  Recent Labs  Lab 04/08/19 0652 04/09/19 0520  NA 132* 130*  K 4.1 3.9  CL 97* 96*  CO2 21* 22  GLUCOSE 156* 285*  BUN 9 12  CREATININE 0.53* 0.58*  CALCIUM 8.3* 7.8*  MG  --  1.9  AST 25  --   ALT 29  --   ALKPHOS 144*  --   BILITOT 1.0  --    ------------------------------------------------------------------------------------------------------------------  Cardiac Enzymes Recent Labs  Lab 04/08/19 1924  TROPONINI <0.03   ------------------------------------------------------------------------------------------------------------------  RADIOLOGY:  Dg Chest Portable 1 View  Result Date: 04/08/2019 CLINICAL DATA:  Weakness, pericardial effusion, history of liver cancer EXAM: PORTABLE CHEST 1 VIEW COMPARISON:  CTA chest dated 04/02/2019 FINDINGS: Mild bibasilar atelectasis with trace bilateral pleural effusions. No frank interstitial edema. No pneumothorax. Enlargement of the cardiac silhouette, corresponding to known pericardial effusion. IMPRESSION: Trace bilateral pleural effusions. Known pericardial effusion. Electronically Signed   By: Julian Hy M.D.   On: 04/08/2019 07:21  [place] Ekg Site Rite  Result Date: 04/09/2019 If Site Rite image not attached, placement could not be confirmed due  to current cardiac rhythm.    ASSESSMENT AND PLAN:   53 year old male with past medical history of liver cancer, osteoarthritis, diabetes, chronic pain, neuropathy who was just discharged from the hospital due to suspected sepsis and noted to have a trivial pericardial effusion now returns back due to chest pain and shortness of breath.  1.  Chest pain/shortness of breath- to be secondary to musculoskeletal/with underlying worsening pericardial effusion. - EKG showed low voltage throughout the precordial leads consistent with a pericardial effusion but no acute ST changes consistent with ischemia. -Patient's cardiac markers x3 were negative.  Seen by cardiology, echocardiogram showing moderate pericardial effusion but no tamponade physiology. - Continue further care as per cardiology.  2.  Pericardial effusion- this morning patient became more hypotensive tachycardic and hypoxic and was transferred to stepdown level of care. -As mentioned above patient's repeat echocardiogram showed moderate pericardial effusion but no tamponade physiology.  Given his hemodynamic instability cardiothoracic surgery was consulted and as per them patient does not need urgent pericardial window or pericardiocentesis.  Continue to follow serial echocardiograms. -CT surgery recommending oncology consultation for possible malignant pericardial effusion work-up.    3.  Chronic pain/neuropathy-patient has radiculopathy in the cervical spine and has chronic pain and neuropathy related to this. -Continue his gabapentin, Cymbalta and Percocet for now.  4.  Diabetes type 2 without complication- cont. SSI and follow BS.   5.  GERD-continue Protonix.    6.  BPH-continue Flomax.  7.  History of gout-no acute attack.  Continue colchicine.   All the records are reviewed and case discussed with Care Management/Social Worker. Management plans discussed with the patient, family and they are in agreement.  CODE  STATUS: Full code  DVT Prophylaxis: Lovenox  TOTAL TIME TAKING CARE OF THIS PATIENT: 35 minutes.   POSSIBLE D/C IN 1-2 DAYS, DEPENDING ON CLINICAL CONDITION.   Henreitta Leber M.D on 04/09/2019 at 2:11 PM  Between 7am to 6pm - Pager - [phone]  After 6pm go to [email or web address] - Proofreader  [facility]  Office  [phone]  CC: Primary care physician; Patient, No Pcp Per

## 2019-04-09 NOTE — Significant Event (Signed)
Rapid Response Event Note  Overview:  Arrived to patients room, Kenneth Born, RN reported patients heart rate had jumped and sustained 160-170's and patient was complaining of constant and increased chest pain.    Initial Focused Assessment: A/O X4 SpO2 high 80's low 90's on 2L [place], oxygen titrated by RT to 6L [place] SpO2 up to mid 90's, no complaints of respiratory distress, lungs clear Patient rates chest pain 9 out of 10, constant pain, radiating to shoulder and throat Radial pulse rate irregular, ranging 110-120's SBP 100's/90's  Interventions: Sainani paged and ordered for patient to be transferred to CCU for higher level of care for HR management.  Plan of Care (if not transferred):  Event Summary: Name of Physician Notified: MD Sainani at [phone]  Name of Consulting Physician Notified: [facility] [place]  Outcome: Transferred (Comment)(to CCU 5)  Event End Time: [place]

## 2019-04-09 NOTE — Progress Notes (Signed)
Patient tachycardic from 140 - 210.  SBP range from  98 to 109.  Pulse ox in the high 80's to low 90's after O2 [place] increased to 6L. Mews score red.  Rapid response called.  Spoke to Dr. Verdell Carmine who is hesitant to given meds to lower HR due to effect it will have on his BP and sats.  He believes the patient needs to have his Pericardial effusion drained.  He is unable to reach Dr. Clayborn Bigness at this time.  Patient transferred to ICU for closer monitoring.
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
## 2019-04-09 NOTE — Progress Notes (Signed)
Dr. Mortimer Fries aware of pt's heart rate and pt's complaints of chest pain. No new orders at this time.

## 2019-04-09 NOTE — Consult Note (Signed)
Name: Kenneth Copeland MRN: [id number] DOB: Jul 03, 1966     CONSULTATION DATE: 04/08/2019  REFERRING MD : Verdell Carmine  CHIEF COMPLAINT: acute resp failure STUDIES:  CT chest 5/11 moderate pericardial effusion RT lung effusion small  ECHO 5.17 moderate pericardial effusion   HISTORY OF PRESENT ILLNESS 53 yo white male transferred to ICU for increased WOB and resp distress +dx of moderate pericardial effusion +tachycardia +hypotension  Patient is in moderate distress Alert and awake  +COCAINE ABUSE COVID TEST NEG x 2  PAST MEDICAL HISTORY :   has a past medical history of Arthritis, Cancer ([place]), Diabetes mellitus without complication ([place]), Hypertension, and Liver cancer ([place]).  has a past surgical history that includes Hernia repair and Liver lobectomy. Prior to Admission medications   Medication Sig Start Date End Date Taking? Authorizing Provider  acetaminophen (TYLENOL) 325 MG tablet Take 650 mg by mouth every 6 (six) hours as needed for mild pain or fever.   Yes [provider]  Artificial Tear Ointment (AKWA TEARS) 01-06-82 % OINT Place 1 drop into the left eye 2 (two) times daily as needed for dry eyes. 03/19/19 04/18/19 Yes [provider]  carboxymethylcellulose (REFRESH PLUS) 0.5 % SOLN Place 2 drops into the left eye 3 (three) times daily.   Yes [provider]  colchicine 0.6 MG tablet Take 1 tablet (0.6 mg total) by mouth 2 (two) times daily. 04/05/19  Yes Gladstone Lighter, MD  dicyclomine (BENTYL) 10 MG capsule Take 10 mg by mouth 4 (four) times daily.    Yes [provider]  DULoxetine (CYMBALTA) 30 MG capsule Take 1 capsule (30 mg total) by mouth daily. 04/05/19 07/04/19 Yes Gladstone Lighter, MD  gabapentin (NEURONTIN) 300 MG capsule Take 1 capsule (300 mg total) by mouth 3 (three) times daily. 04/05/19  Yes Gladstone Lighter, MD  insulin glargine (LANTUS) 100 UNIT/ML injection Inject 40 Units into the skin 2 (two) times daily.     Yes [provider]  insulin lispro (HUMALOG) 100 UNIT/ML injection Inject 10 Units into the skin 3 (three) times daily before meals.    Yes [provider]  metoCLOPramide (REGLAN) 10 MG tablet Take 10 mg by mouth 4 (four) times daily -  before meals and at bedtime.    Yes [provider]  neomycin-polymyxin-hydrocortisone (CORTISPORIN) 3.5-10000-1 ophthalmic suspension Place 1 drop into the left eye every 6 (six) hours. 03/19/19 04/18/19 Yes [provider]  oxyCODONE-acetaminophen (PERCOCET) 7.5-325 MG tablet Take 2 tablets by mouth every 6 (six) hours as needed for severe pain. 04/05/19  Yes Gladstone Lighter, MD  pantoprazole (PROTONIX) 40 MG tablet Take 40 mg by mouth 2 (two) times daily.    Yes [provider]  sucralfate (CARAFATE) 1 g tablet Take 1 g by mouth 4 (four) times daily.    Yes [provider]  tamsulosin (FLOMAX) 0.4 MG CAPS capsule Take 1 capsule (0.4 mg total) by mouth daily. 04/05/19  Yes Gladstone Lighter, MD   Allergies  Allergen Reactions  . Metformin And Related     Pt. States it makes his stomach bleed because of a stomach ulcer  . Motrin [Ibuprofen]     Pt. States it "inflames his stomach, causes bleeding in stomach"    FAMILY HISTORY:  Family history is unknown by patient. SOCIAL HISTORY:  reports that he has quit smoking. He has never used smokeless tobacco. He reports previous alcohol use. He reports current drug use. Frequency: 0.50 times per week. Drug: Marijuana.  Review of Systems:  Gen:  Denies  Fever,+ sweats, +chills +weigh loss  HEENT: Denies blurred vision, double vision, ear pain, eye pain, hearing loss, nose bleeds, sore throat Cardiac:  +dizziness, +chest pain or heaviness, +chest tightness,-edema, + JVD Resp:   + cough, -sputum production, +shortness of breath,+wheezing, -hemoptysis,  Gi: Denies swallowing difficulty, stomach pain, nausea or vomiting, diarrhea, constipation, bowel  incontinence Gu:  Denies bladder incontinence, burning urine Ext:   Denies Joint pain, stiffness or swelling Skin: Denies  skin rash, easy bruising or bleeding or hives Endoc:  Denies polyuria, polydipsia , polyphagia or weight change Psych:   Denies depression, insomnia or hallucinations  Other:  All other systems negative   VITAL SIGNS: Temp:  [98 F (36.7 C)-99.7 F (37.6 C)] 99.7 F (37.6 C) (05/18 0839) Pulse Rate:  [117-160] 136 (05/18 0839) Resp:  [18-25] 25 (05/18 0839) BP: (98-130)/(68-106) 121/106 (05/18 0839) SpO2:  [88 %-96 %] 96 % (05/18 0839) Weight:  [84.3 kg-85.2 kg] 85.2 kg (05/18 0839)   No intake/output data recorded. No intake/output data recorded.   SpO2: 96 % O2 Flow Rate (L/min): 6 L/min   Physical Examination:  GENERAL:critically ill appearing, +resp distress HEAD: Normocephalic, atraumatic.  EYES: Pupils equal, round, reactive to light.  No scleral icterus.  MOUTH: Moist mucosal membrane. NECK: Supple. No JVD.  PULMONARY: +rhonchi, +wheezing CARDIOVASCULAR: S1 and S2. Regular rate and rhythm. No murmurs, rubs, or gallops.  GASTROINTESTINAL: Soft, nontender, -distended. No masses. Positive bowel sounds. No hepatosplenomegaly.  MUSCULOSKELETAL: No swelling, clubbing, or edema.  NEUROLOGIC: alert and awake SKIN:intact,warm,dry    MEDICATIONS: I have reviewed all medications and confirmed regimen as documented   CULTURE RESULTS   Recent Results (from the past 240 hour(s))  Blood Culture (routine x 2)     Status: None   Collection Time: 04/02/19  9:09 AM  Result Value Ref Range Status   Specimen Description BLOOD LEFT ANTECUBITAL  Final   Special Requests   Final    BOTTLES DRAWN AEROBIC AND ANAEROBIC Blood Culture adequate volume   Culture   Final    NO GROWTH 5 DAYS Performed at [facility], [address], [place], [place] [address]    Report Status 04/07/2019 FINAL  Final  Blood Culture (routine x 2)     Status: None    Collection Time: 04/02/19  9:09 AM  Result Value Ref Range Status   Specimen Description BLOOD BLOOD RIGHT FOREARM  Final   Special Requests   Final    BOTTLES DRAWN AEROBIC AND ANAEROBIC Blood Culture adequate volume   Culture   Final    NO GROWTH 5 DAYS Performed at [facility], [address] [place], [place] [address]    Report Status 04/07/2019 FINAL  Final  SARS Coronavirus 2 [facility] order, Performed in [place] hospital lab)     Status: None   Collection Time: 04/02/19  9:09 AM  Result Value Ref Range Status   SARS Coronavirus 2 NEGATIVE NEGATIVE Final    Comment: (NOTE) If result is NEGATIVE SARS-CoV-2 target nucleic acids are NOT DETECTED. The SARS-CoV-2 RNA is generally detectable in upper and lower  respiratory specimens during the acute phase of infection. The lowest  concentration of SARS-CoV-2 viral copies this assay can detect is 250  copies / mL. A negative result does not preclude SARS-CoV-2 infection  and should not be used as the sole basis for treatment or other  patient management decisions.  A negative result may  occur with  improper specimen collection / handling, submission of specimen other  than nasopharyngeal swab, presence of viral mutation(s) within the  areas targeted by this assay, and inadequate number of viral copies  (<250 copies / mL). A negative result must be combined with clinical  observations, patient history, and epidemiological information. If result is POSITIVE SARS-CoV-2 target nucleic acids are DETECTED. The SARS-CoV-2 RNA is generally detectable in upper and lower  respiratory specimens dur ing the acute phase of infection.  Positive  results are indicative of active infection with SARS-CoV-2.  Clinical  correlation with patient history and other diagnostic information is  necessary to determine patient infection status.  Positive results do  not rule out bacterial infection or co-infection with other viruses. If  result is PRESUMPTIVE POSTIVE SARS-CoV-2 nucleic acids MAY BE PRESENT.   A presumptive positive result was obtained on the submitted specimen  and confirmed on repeat testing.  While 2019 novel coronavirus  (SARS-CoV-2) nucleic acids may be present in the submitted sample  additional confirmatory testing may be necessary for epidemiological  and / or clinical management purposes  to differentiate between  SARS-CoV-2 and other Sarbecovirus currently known to infect humans.  If clinically indicated additional testing with an alternate test  methodology [phone]) is advised. The SARS-CoV-2 RNA is generally  detectable in upper and lower respiratory sp ecimens during the acute  phase of infection. The expected result is Negative. Fact Sheet for Patients:  StrictlyIdeas.no Fact Sheet for Healthcare Providers: BankingDealers.co.za This test is not yet approved or cleared by the [place] FDA and has been authorized for detection and/or diagnosis of SARS-CoV-2 by FDA under an Emergency Use Authorization (EUA).  This EUA will remain in effect (meaning this test can be used) for the duration of the COVID-19 declaration under Section 564(b)(1) of the Act, 21 U.S.C. section 360bbb-3(b)(1), unless the authorization is terminated or revoked sooner. Performed at [facility], [address] [place], [place] [address]   SARS Coronavirus 2 (CEPHEID - Performed in [facility] hospital lab), Hosp Order     Status: None   Collection Time: 04/08/19  7:40 AM  Result Value Ref Range Status   SARS Coronavirus 2 NEGATIVE NEGATIVE Final    Comment: (NOTE) If result is NEGATIVE SARS-CoV-2 target nucleic acids are NOT DETECTED. The SARS-CoV-2 RNA is generally detectable in upper and lower  respiratory specimens during the acute phase of infection. The lowest  concentration of SARS-CoV-2 viral copies this assay can detect is 250  copies / mL. A  negative result does not preclude SARS-CoV-2 infection  and should not be used as the sole basis for treatment or other  patient management decisions.  A negative result may occur with  improper specimen collection / handling, submission of specimen other  than nasopharyngeal swab, presence of viral mutation(s) within the  areas targeted by this assay, and inadequate number of viral copies  (<250 copies / mL). A negative result must be combined with clinical  observations, patient history, and epidemiological information. If result is POSITIVE SARS-CoV-2 target nucleic acids are DETECTED. The SARS-CoV-2 RNA is generally detectable in upper and lower  respiratory specimens dur ing the acute phase of infection.  Positive  results are indicative of active infection with SARS-CoV-2.  Clinical  correlation with patient history and other diagnostic information is  necessary to determine patient infection status.  Positive results do  not rule out bacterial infection or co-infection with other viruses. If result is PRESUMPTIVE  POSTIVE SARS-CoV-2 nucleic acids MAY BE PRESENT.   A presumptive positive result was obtained on the submitted specimen  and confirmed on repeat testing.  While 2019 novel coronavirus  (SARS-CoV-2) nucleic acids may be present in the submitted sample  additional confirmatory testing may be necessary for epidemiological  and / or clinical management purposes  to differentiate between  SARS-CoV-2 and other Sarbecovirus currently known to infect humans.  If clinically indicated additional testing with an alternate test  methodology [phone]) is advised. The SARS-CoV-2 RNA is generally  detectable in upper and lower respiratory sp ecimens during the acute  phase of infection. The expected result is Negative. Fact Sheet for Patients:  StrictlyIdeas.no Fact Sheet for Healthcare Providers: BankingDealers.co.za This test is not  yet approved or cleared by the [place] FDA and has been authorized for detection and/or diagnosis of SARS-CoV-2 by FDA under an Emergency Use Authorization (EUA).  This EUA will remain in effect (meaning this test can be used) for the duration of the COVID-19 declaration under Section 564(b)(1) of the Act, 21 U.S.C. section 360bbb-3(b)(1), unless the authorization is terminated or revoked sooner. Performed at [facility], [address] [place], [place] [address]             The CT chest was Independently Reviewed By Me Today CT chest  reviewed-moderate sized pericardial effusions         ASSESSMENT AND PLAN SYNOPSIS  Acute resp distress from acute COPD exacerbation in setting of acute moderate worsen pericardial effusions likely from COCAINE ABUSE-possible hemorrhagic effusion complicated by tachycardia and hypotension  Patient  Is critically ill   Severe ACUTE Hypoxic and Respiratory Failure  From COPD exacerbation and pericardial effusion and cocaine abuse SEVERE COPD EXACERBATION -continue IV steroids as prescribed -continue NEB THERAPY as prescribed -morphine as needed -wean fio2 as needed and tolerated   ACUTE  CARDIAC FAILURE- from Pericardial effusion most likely -oxygen as needed -Lasix as tolerated -follow up cardiac enzymes as indicated -follow up cardiology recs  DR [place] WITH CT SURGERY NOTIFIED-patient will need pericardial window/percardiocentiesis for therapeutic and definitive dx   SHOCK-SEPSIS/HYPOVOLUMIC/CARDIOGENIC -use vasopressors to keep MAP>65 if needed -follow ABG and LA -follow up cultures   CARDIAC ICU monitoring  ID -continue IV abx as prescibed -follow up cultures  GI GI PROPHYLAXIS as indicated  NUTRITIONAL STATUS DIET-->NPO Constipation protocol as indicated   ENDO - will use ICU hypoglycemic\Hyperglycemia protocol if needed    ELECTROLYTES -follow labs as needed -replace as  needed -pharmacy consultation and following   DVT/GI PRX ordered TRANSFUSIONS AS NEEDED MONITOR FSBS ASSESS the need for LABS as needed   Critical Care Time devoted to patient care services described in this note is 37 minutes.   Overall, patient is critically ill, prognosis is guarded.  Patient with Multiorgan failure and at high risk for cardiac arrest and death.    Corrin Parker, M.D.  [facility]  Medical Director [place] Director [facility] Cardio-Pulmonary Department

## 2019-04-09 NOTE — TOC Initial Note (Signed)
Transition of Care [facility]) - Initial/Assessment Note    Patient Details  Name: Kenneth Copeland MRN: [id number] Date of Birth: 07/19/1966  [facility] CM/SW Contact:    Shelbie Hutching, RN Phone Number: 04/09/2019, 11:15 AM  Clinical Narrative:                 Patient admitted for chest pain, patient was just recently discharged for pericardial effusion.  Patient is currently in the ICU scheduled for the OR this afternoon for drainage of pericardial effusion.  Patient is pale and diaphoretic with HR in the 160's-180's.  Patient moved to [place] 2 weeks ago from [place] with his long time girlfriend Kenneth Copeland.  Patient and girlfriend moved to be closer to her daughter and grandchildren.  Patient and girlfriend are living at the [facility] in [place] while looking for an apartment.  Patient was set up with Medication Management last admission and he has an appointment with a PCP on June 11th, originally it was for May 20 th but he is in the hospital.  Patient reports that he is on disability and working on getting a [place] Medicare plan.  Patient does not not currently have transportation but informed Kenneth Copeland the girlfriend about [employer] and [employer], RNCM will also give the patient written resources for transportation.  RNCM will cont to follow and assist with any discharge needs.    Expected Discharge Plan: [place] Barriers to Discharge: Continued Medical Work up   Patient Goals and CMS Choice        Expected Discharge Plan and Services Expected Discharge Plan: [place]   Discharge Planning Services: CM Consult   Living arrangements for the past 2 months: Hotel/Motel Expected Discharge Date: 04/10/19                                    Prior Living Arrangements/Services Living arrangements for the past 2 months: Hotel/Motel Lives with:: Domestic Partner Patient language and need for interpreter reviewed:: No Do you feel safe going back  to the place where you live?: Yes      Need for Family Participation in Patient Care: Yes (Comment)(chronic medical conditions ) Care giver support system in place?: Yes (comment)(Girlfriend)   Criminal Activity/Legal Involvement Pertinent to Current Situation/Hospitalization: No - Comment as needed  Activities of Daily Living Home Assistive Devices/Equipment: Eyeglasses, Contact lenses, CBG Meter ADL Screening (condition at time of admission) Patient's cognitive ability adequate to safely complete daily activities?: Yes Is the patient deaf or have difficulty hearing?: No Does the patient have difficulty seeing, even when wearing glasses/contacts?: Yes Does the patient have difficulty concentrating, remembering, or making decisions?: No Patient able to express need for assistance with ADLs?: Yes Does the patient have difficulty dressing or bathing?: No Independently performs ADLs?: Yes (appropriate for developmental age) Does the patient have difficulty walking or climbing stairs?: No Weakness of Legs: Both Weakness of Arms/Hands: None  Permission Sought/Granted Permission sought to share information with : Family Supports Permission granted to share information with : Yes, Verbal Permission Granted  Share Information with NAME: Kenneth Copeland- long time girlfriend           Emotional Assessment Appearance:: Appears older than stated age Attitude/Demeanor/Rapport: Engaged Affect (typically observed): Accepting Orientation: : Oriented to Self, Oriented to Place, Oriented to  Time, Oriented to Situation Alcohol / Substance Use: Illicit Drugs Psych Involvement:  No (comment)  Admission diagnosis:  Pericardial effusion [I31.3] Chest pain, unspecified type [R07.9] Patient Active Problem List   Diagnosis Date Noted  . Chest pain 04/08/2019  . Pericardial effusion 04/02/2019   PCP:  Patient, No Pcp Per Pharmacy:   [facility] [address], [place] - [place] [address] Phone: [phone] Fax: [phone]     Social Determinants of Health (SDOH) Interventions    Readmission Risk Interventions No flowsheet data found.

## 2019-04-09 NOTE — Progress Notes (Signed)
Peripherally Inserted Central Catheter/Midline Placement  The IV Nurse has discussed with the patient and/or persons authorized to consent for the patient, the purpose of this procedure and the potential benefits and risks involved with this procedure.  The benefits include less needle sticks, lab draws from the catheter, and the patient may be discharged home with the catheter. Risks include, but not limited to, infection, bleeding, blood clot (thrombus formation), and puncture of an artery; nerve damage and irregular heartbeat and possibility to perform a PICC exchange if needed/ordered by physician.  Alternatives to this procedure were also discussed.  Bard Power PICC patient education guide, fact sheet on infection prevention and patient information card has been provided to patient /or left at bedside.    PICC/Midline Placement Documentation  PICC Triple Lumen 94/70/96 PICC Right Basilic 45 cm 2 cm (Active)  Indication for Insertion or Continuance of Line Poor Vasculature-patient has had multiple peripheral attempts or PIVs lasting less than 24 hours 04/09/2019  3:00 PM  Exposed Catheter (cm) 2 cm 04/09/2019  3:00 PM  Site Assessment Clean;Dry;Intact 04/09/2019  3:00 PM  Lumen #1 Status Flushed;Blood return noted 04/09/2019  3:00 PM  Lumen #2 Status Flushed;Blood return noted 04/09/2019  3:00 PM  Lumen #3 Status Flushed;Blood return noted 04/09/2019  3:00 PM  Dressing Type Transparent 04/09/2019  3:00 PM  Dressing Status Clean;Dry;Intact;Antimicrobial disc in place 04/09/2019  3:00 PM  Dressing Change Due 04/16/19 04/09/2019  3:00 PM       Jule Economy Horton 04/09/2019, 3:26 PM

## 2019-04-10 ENCOUNTER — Inpatient Hospital Stay: Payer: Medicare (Managed Care)

## 2019-04-10 DIAGNOSIS — B9562 Methicillin resistant Staphylococcus aureus infection as the cause of diseases classified elsewhere: Secondary | ICD-10-CM

## 2019-04-10 DIAGNOSIS — J9600 Acute respiratory failure, unspecified whether with hypoxia or hypercapnia: Secondary | ICD-10-CM

## 2019-04-10 DIAGNOSIS — Z8505 Personal history of malignant neoplasm of liver: Secondary | ICD-10-CM

## 2019-04-10 DIAGNOSIS — R7881 Bacteremia: Secondary | ICD-10-CM

## 2019-04-10 DIAGNOSIS — K0889 Other specified disorders of teeth and supporting structures: Secondary | ICD-10-CM

## 2019-04-10 DIAGNOSIS — Z888 Allergy status to other drugs, medicaments and biological substances status: Secondary | ICD-10-CM

## 2019-04-10 DIAGNOSIS — Z9049 Acquired absence of other specified parts of digestive tract: Secondary | ICD-10-CM

## 2019-04-10 DIAGNOSIS — K3184 Gastroparesis: Secondary | ICD-10-CM

## 2019-04-10 DIAGNOSIS — E1143 Type 2 diabetes mellitus with diabetic autonomic (poly)neuropathy: Secondary | ICD-10-CM

## 2019-04-10 DIAGNOSIS — K6289 Other specified diseases of anus and rectum: Secondary | ICD-10-CM

## 2019-04-10 DIAGNOSIS — Z9221 Personal history of antineoplastic chemotherapy: Secondary | ICD-10-CM

## 2019-04-10 DIAGNOSIS — I82890 Acute embolism and thrombosis of other specified veins: Secondary | ICD-10-CM

## 2019-04-10 DIAGNOSIS — Z87891 Personal history of nicotine dependence: Secondary | ICD-10-CM

## 2019-04-10 DIAGNOSIS — D649 Anemia, unspecified: Secondary | ICD-10-CM

## 2019-04-10 DIAGNOSIS — Z886 Allergy status to analgesic agent status: Secondary | ICD-10-CM

## 2019-04-10 DIAGNOSIS — F1490 Cocaine use, unspecified, uncomplicated: Secondary | ICD-10-CM

## 2019-04-10 DIAGNOSIS — Z794 Long term (current) use of insulin: Secondary | ICD-10-CM

## 2019-04-10 DIAGNOSIS — K859 Acute pancreatitis without necrosis or infection, unspecified: Secondary | ICD-10-CM

## 2019-04-10 LAB — BLOOD CULTURE ID PANEL (REFLEXED)

## 2019-04-10 LAB — PREPARE RBC (CROSSMATCH)

## 2019-04-10 LAB — TYPE AND SCREEN
ABO/RH(D): A POS
Antibody Screen: NEGATIVE
Unit division: 0
Unit division: 0

## 2019-04-10 LAB — BPAM RBC
Blood Product Expiration Date: 202006062359
Blood Product Expiration Date: 202006072359
Unit Type and Rh: 6200
Unit Type and Rh: 6200

## 2019-04-10 LAB — BASIC METABOLIC PANEL
Anion gap: 11 (ref 5–15)
BUN: 18 mg/dL (ref 6–20)
CO2: 23 mmol/L (ref 22–32)
Calcium: 7.8 mg/dL — ABNORMAL LOW (ref 8.9–10.3)
Chloride: 96 mmol/L — ABNORMAL LOW (ref 98–111)
Creatinine, Ser: 0.75 mg/dL (ref 0.61–1.24)
GFR calc Af Amer: >60 mL/min (ref >60)
GFR calc non Af Amer: >60 mL/min (ref >60)
Glucose, Bld: 435 mg/dL — ABNORMAL HIGH (ref 70–99)
Potassium: 3.6 mmol/L (ref 3.5–5.1)
Sodium: 130 mmol/L — ABNORMAL LOW (ref 135–145)

## 2019-04-10 LAB — GLUCOSE, CAPILLARY
Glucose-Capillary: 320 mg/dL — ABNORMAL HIGH (ref 70–99)
Glucose-Capillary: 345 mg/dL — ABNORMAL HIGH (ref 70–99)
Glucose-Capillary: 348 mg/dL — ABNORMAL HIGH (ref 70–99)
Glucose-Capillary: 390 mg/dL — ABNORMAL HIGH (ref 70–99)
Glucose-Capillary: 392 mg/dL — ABNORMAL HIGH (ref 70–99)
Glucose-Capillary: 404 mg/dL — ABNORMAL HIGH (ref 70–99)
Glucose-Capillary: 411 mg/dL — ABNORMAL HIGH (ref 70–99)
Glucose-Capillary: 470 mg/dL — ABNORMAL HIGH (ref 70–99)

## 2019-04-10 LAB — HEPATITIS PANEL, ACUTE
HCV Ab: <0.1 s/co ratio (ref 0.0–0.9)
Hep A IgM: NEGATIVE
Hep B C IgM: NEGATIVE
Hepatitis B Surface Ag: NEGATIVE

## 2019-04-10 LAB — HIV ANTIBODY (ROUTINE TESTING W REFLEX)
HIV Screen 4th Generation wRfx: NONREACTIVE
HIV Screen 4th Generation wRfx: NONREACTIVE

## 2019-04-10 LAB — PROCALCITONIN: Procalcitonin: 1.1 ng/mL

## 2019-04-10 LAB — MAGNESIUM: Magnesium: 2.1 mg/dL (ref 1.7–2.4)

## 2019-04-10 MED ORDER — VANCOMYCIN HCL 10 G IV SOLR
1750.0000 mg | Freq: Two times a day (BID) | INTRAVENOUS | Status: DC
  Administered 2019-04-11: 02:00:00 1750 mg via INTRAVENOUS
  Filled 2019-04-10 (×3): qty 1750

## 2019-04-10 MED ORDER — PREDNISONE 20 MG PO TABS
40.0000 mg | ORAL_TABLET | Freq: Every day | ORAL | Status: AC
  Administered 2019-04-11 – 2019-04-14 (×4): 40 mg via ORAL
  Filled 2019-04-10 (×4): qty 2

## 2019-04-10 MED ORDER — MORPHINE SULFATE (PF) 2 MG/ML IV SOLN
2.0000 mg | INTRAVENOUS | Status: DC | PRN
  Administered 2019-04-10 – 2019-04-25 (×54): 2 mg via INTRAVENOUS
  Filled 2019-04-10 (×55): qty 1

## 2019-04-10 MED ORDER — INSULIN ASPART 100 UNIT/ML ~~LOC~~ SOLN
0.0000 [IU] | Freq: Three times a day (TID) | SUBCUTANEOUS | Status: DC
  Administered 2019-04-10: 20 [IU] via SUBCUTANEOUS
  Administered 2019-04-11: 12:00:00 11 [IU] via SUBCUTANEOUS
  Administered 2019-04-11: 17:00:00 4 [IU] via SUBCUTANEOUS
  Administered 2019-04-11: 08:00:00 11 [IU] via SUBCUTANEOUS
  Administered 2019-04-12 (×2): 7 [IU] via SUBCUTANEOUS
  Administered 2019-04-12: 17:00:00 11 [IU] via SUBCUTANEOUS
  Administered 2019-04-13: 09:00:00 3 [IU] via SUBCUTANEOUS
  Administered 2019-04-13 – 2019-04-14 (×2): 4 [IU] via SUBCUTANEOUS
  Administered 2019-04-14: 17:00:00 7 [IU] via SUBCUTANEOUS
  Administered 2019-04-15: 09:00:00 3 [IU] via SUBCUTANEOUS
  Administered 2019-04-15 (×2): 7 [IU] via SUBCUTANEOUS
  Administered 2019-04-16: 12:00:00 4 [IU] via SUBCUTANEOUS
  Administered 2019-04-17 – 2019-04-18 (×2): 3 [IU] via SUBCUTANEOUS
  Administered 2019-04-18 – 2019-04-19 (×2): 4 [IU] via SUBCUTANEOUS
  Administered 2019-04-20 (×2): 3 [IU] via SUBCUTANEOUS
  Administered 2019-04-21: 18:00:00 4 [IU] via SUBCUTANEOUS
  Administered 2019-04-21: 7 [IU] via SUBCUTANEOUS
  Administered 2019-04-22 – 2019-04-23 (×3): 4 [IU] via SUBCUTANEOUS
  Administered 2019-04-24: 3 [IU] via SUBCUTANEOUS
  Administered 2019-04-24: 4 [IU] via SUBCUTANEOUS
  Administered 2019-04-25: 3 [IU] via SUBCUTANEOUS
  Filled 2019-04-10 (×31): qty 1

## 2019-04-10 MED ORDER — INSULIN ASPART 100 UNIT/ML ~~LOC~~ SOLN
10.0000 [IU] | Freq: Once | SUBCUTANEOUS | Status: AC
  Administered 2019-04-10: 10 [IU] via SUBCUTANEOUS
  Filled 2019-04-10: qty 1

## 2019-04-10 MED ORDER — VANCOMYCIN HCL 10 G IV SOLR
2000.0000 mg | Freq: Once | INTRAVENOUS | Status: AC
  Administered 2019-04-10: 2000 mg via INTRAVENOUS
  Filled 2019-04-10: qty 2000

## 2019-04-10 MED ORDER — INSULIN ASPART 100 UNIT/ML ~~LOC~~ SOLN
0.0000 [IU] | Freq: Every day | SUBCUTANEOUS | Status: DC
  Administered 2019-04-10: 5 [IU] via SUBCUTANEOUS
  Administered 2019-04-11: 23:00:00 3 [IU] via SUBCUTANEOUS
  Administered 2019-04-12 – 2019-04-13 (×2): 4 [IU] via SUBCUTANEOUS
  Administered 2019-04-14 – 2019-04-24 (×3): 2 [IU] via SUBCUTANEOUS
  Filled 2019-04-10 (×7): qty 1

## 2019-04-10 MED ORDER — INSULIN GLARGINE 100 UNIT/ML ~~LOC~~ SOLN
40.0000 [IU] | Freq: Two times a day (BID) | SUBCUTANEOUS | Status: DC
  Administered 2019-04-10 – 2019-04-18 (×17): 40 [IU] via SUBCUTANEOUS
  Filled 2019-04-10 (×19): qty 0.4

## 2019-04-10 MED ORDER — IPRATROPIUM-ALBUTEROL 0.5-2.5 (3) MG/3ML IN SOLN: 3.0000 mL | Freq: Four times a day (QID) | RESPIRATORY_TRACT | Status: DC | PRN

## 2019-04-10 MED ORDER — POTASSIUM CHLORIDE CRYS ER 20 MEQ PO TBCR
40.0000 meq | EXTENDED_RELEASE_TABLET | Freq: Once | ORAL | Status: AC
  Administered 2019-04-10: 18:00:00 40 meq via ORAL
  Filled 2019-04-10: qty 2
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
## 2019-04-10 NOTE — Progress Notes (Signed)
Inpatient Diabetes Program Recommendations  AACE/ADA: New Consensus Statement on Inpatient Glycemic Control   Target Ranges:  Prepandial:   less than 140 mg/dL      Peak postprandial:   less than 180 mg/dL (1-2 hours)      Critically ill patients:  140 - 180 mg/dL   Results for LANCELOT, ALYEA (MRN [id number]) as of 04/10/2019 09:58  Ref. Range 04/09/2019 07:34 04/09/2019 08:40 04/09/2019 12:15 04/09/2019 15:39 04/09/2019 19:39 04/09/2019 23:41 04/10/2019 03:42 04/10/2019 07:38  Glucose-Capillary Latest Ref Range: 70 - 99 mg/dL 308 (H)   296 (H)  Novolog 7 units   308 (H)  Novolog 11 units 309 (H)  Novolog 11 units  Lantus 20 units 285 (H)  Novolog 8 units 368 (H)  Novolog 15 units 348 (H)  Novolog 11 units 320 (H)  Novolog 11 units   Review of Glycemic Control  Diabetes history: DM2 Outpatient Diabetes medications:Lantus 40 units BID, Humalog 10 units TID with meals plus additional units for correction when needed Current orders for Inpatient glycemic control: Lantus 20 units daily, Novolog 0-15 units Q4H; Solumedrol 20 mg BID  Inpatient Diabetes Program Recommendations:   Insulin-Basal: Please consider increasing Lantus to 40 units BID.  Thanks, Barnie Alderman, RN, MSN, CDE Diabetes Coordinator Inpatient Diabetes Program [phone] (Team Pager from 8am to 5pm)

## 2019-04-10 NOTE — Progress Notes (Signed)
SYNOPSIS transferred to ICU for resp distress, pericardial effusion CC  Follow up resp distress   HPI Pericardial effusion seems to be stable Repeat echo in 49 hrs resp distress better HR better with amiodarone infusion Mild DT's      VITALS: BP 108/75 (BP Location: Right Arm)   Pulse 84   Temp (!) 97.4 F (36.3 C) (Oral)   Resp 13   Ht 5\' 11"  (1.803 m)   Wt 85.2 kg   SpO2 94%   BMI 26.20 kg/m     I/O last 3 completed shifts: In: 3315.3 [P.O.:120; I.V.:2184.9; IV Piggyback:1010.4] Out: 450 [Urine:450] Total I/O In: 33.3 [I.V.:33.3] Out: 300 [Urine:300]  SpO2: 94 % O2 Flow Rate (L/min): 4 L/min   Review of Systems:  Gen:  Denies  fever, sweats, chills weigh loss  HEENT: Denies blurred vision, double vision, ear pain, eye pain, hearing loss, nose bleeds, sore throat Cardiac:  No dizziness, chest pain or heaviness, chest tightness,edema, No JVD Resp:   No cough, -sputum production, -shortness of breath,-wheezing, -hemoptysis,  Gi: Denies swallowing difficulty, stomach pain, nausea or vomiting, diarrhea, constipation, bowel incontinence Gu:  Denies bladder incontinence, burning urine Ext:   Denies Joint pain, stiffness or swelling Skin: Denies  skin rash, easy bruising or bleeding or hives Endoc:  Denies polyuria, polydipsia , polyphagia or weight change Psych:   Denies depression, insomnia or hallucinations  Other:  All other systems negative  Physical Examination:   GENERAL:NAD, no fevers, chills, no weakness no fatigue HEAD: Normocephalic, atraumatic.  EYES: PERLA, EOMI No scleral icterus.  MOUTH: Moist mucosal membrane.  EAR, NOSE, THROAT: Clear without exudates. No external lesions.  NECK: Supple. No thyromegaly.  No JVD.  PULMONARY: CTA B/L no wheezing, rhonchi, crackles CARDIOVASCULAR: S1 and S2. Regular rate and rhythm. No murmurs GASTROINTESTINAL: Soft, nontender, nondistended. Positive bowel sounds.  MUSCULOSKELETAL: No swelling, clubbing, or  edema.  NEUROLOGIC: No gross focal neurological deficits. 5/5 strength all extremities SKIN: No ulceration, lesions, rashes, or cyanosis.  PSYCHIATRIC: Insight, judgment intact. -depression -anxiety ALL OTHER ROS ARE NEGATIVE    I personally reviewed Labs under Results section.   MEDICATIONS: I have reviewed all medications and confirmed regimen as documented   CULTURE RESULTS   Recent Results (from the past 240 hour(s))  Blood Culture (routine x 2)     Status: None   Collection Time: 04/02/19  9:09 AM  Result Value Ref Range Status   Specimen Description BLOOD LEFT ANTECUBITAL  Final   Special Requests   Final    BOTTLES DRAWN AEROBIC AND ANAEROBIC Blood Culture adequate volume   Culture   Final    NO GROWTH 5 DAYS Performed at [facility], [address] [place], [place] [address]    Report Status 04/07/2019 FINAL  Final  Blood Culture (routine x 2)     Status: None   Collection Time: 04/02/19  9:09 AM  Result Value Ref Range Status   Specimen Description BLOOD BLOOD RIGHT FOREARM  Final   Special Requests   Final    BOTTLES DRAWN AEROBIC AND ANAEROBIC Blood Culture adequate volume   Culture   Final    NO GROWTH 5 DAYS Performed at [facility], [address], [place], [place] [address]    Report Status 04/07/2019 FINAL  Final  SARS Coronavirus 2 [facility] order, Performed in [facility] hospital lab)     Status: None   Collection Time: 04/02/19  9:09 AM  Result Value Ref Range Status  SARS Coronavirus 2 NEGATIVE NEGATIVE Final    Comment: (NOTE) If result is NEGATIVE SARS-CoV-2 target nucleic acids are NOT DETECTED. The SARS-CoV-2 RNA is generally detectable in upper and lower  respiratory specimens during the acute phase of infection. The lowest  concentration of SARS-CoV-2 viral copies this assay can detect is 250  copies / mL. A negative result does not preclude SARS-CoV-2 infection  and should not be used as the sole basis for treatment  or other  patient management decisions.  A negative result may occur with  improper specimen collection / handling, submission of specimen other  than nasopharyngeal swab, presence of viral mutation(s) within the  areas targeted by this assay, and inadequate number of viral copies  (<250 copies / mL). A negative result must be combined with clinical  observations, patient history, and epidemiological information. If result is POSITIVE SARS-CoV-2 target nucleic acids are DETECTED. The SARS-CoV-2 RNA is generally detectable in upper and lower  respiratory specimens dur ing the acute phase of infection.  Positive  results are indicative of active infection with SARS-CoV-2.  Clinical  correlation with patient history and other diagnostic information is  necessary to determine patient infection status.  Positive results do  not rule out bacterial infection or co-infection with other viruses. If result is PRESUMPTIVE POSTIVE SARS-CoV-2 nucleic acids MAY BE PRESENT.   A presumptive positive result was obtained on the submitted specimen  and confirmed on repeat testing.  While 2019 novel coronavirus  (SARS-CoV-2) nucleic acids may be present in the submitted sample  additional confirmatory testing may be necessary for epidemiological  and / or clinical management purposes  to differentiate between  SARS-CoV-2 and other Sarbecovirus currently known to infect humans.  If clinically indicated additional testing with an alternate test  methodology [phone]) is advised. The SARS-CoV-2 RNA is generally  detectable in upper and lower respiratory sp ecimens during the acute  phase of infection. The expected result is Negative. Fact Sheet for Patients:  StrictlyIdeas.no Fact Sheet for Healthcare Providers: BankingDealers.co.za This test is not yet approved or cleared by the [place] FDA and has been authorized for detection and/or diagnosis of  SARS-CoV-2 by FDA under an Emergency Use Authorization (EUA).  This EUA will remain in effect (meaning this test can be used) for the duration of the COVID-19 declaration under Section 564(b)(1) of the Act, 21 U.S.C. section 360bbb-3(b)(1), unless the authorization is terminated or revoked sooner. Performed at [facility], [address] [place], [place] [address]   SARS Coronavirus 2 (CEPHEID - Performed in [facility] hospital lab), Hosp Order     Status: None   Collection Time: 04/08/19  7:40 AM  Result Value Ref Range Status   SARS Coronavirus 2 NEGATIVE NEGATIVE Final    Comment: (NOTE) If result is NEGATIVE SARS-CoV-2 target nucleic acids are NOT DETECTED. The SARS-CoV-2 RNA is generally detectable in upper and lower  respiratory specimens during the acute phase of infection. The lowest  concentration of SARS-CoV-2 viral copies this assay can detect is 250  copies / mL. A negative result does not preclude SARS-CoV-2 infection  and should not be used as the sole basis for treatment or other  patient management decisions.  A negative result may occur with  improper specimen collection / handling, submission of specimen other  than nasopharyngeal swab, presence of viral mutation(s) within the  areas targeted by this assay, and inadequate number of viral copies  (<250 copies / mL). A negative result must  be combined with clinical  observations, patient history, and epidemiological information. If result is POSITIVE SARS-CoV-2 target nucleic acids are DETECTED. The SARS-CoV-2 RNA is generally detectable in upper and lower  respiratory specimens dur ing the acute phase of infection.  Positive  results are indicative of active infection with SARS-CoV-2.  Clinical  correlation with patient history and other diagnostic information is  necessary to determine patient infection status.  Positive results do  not rule out bacterial infection or co-infection with other  viruses. If result is PRESUMPTIVE POSTIVE SARS-CoV-2 nucleic acids MAY BE PRESENT.   A presumptive positive result was obtained on the submitted specimen  and confirmed on repeat testing.  While 2019 novel coronavirus  (SARS-CoV-2) nucleic acids may be present in the submitted sample  additional confirmatory testing may be necessary for epidemiological  and / or clinical management purposes  to differentiate between  SARS-CoV-2 and other Sarbecovirus currently known to infect humans.  If clinically indicated additional testing with an alternate test  methodology [phone]) is advised. The SARS-CoV-2 RNA is generally  detectable in upper and lower respiratory sp ecimens during the acute  phase of infection. The expected result is Negative. Fact Sheet for Patients:  StrictlyIdeas.no Fact Sheet for Healthcare Providers: BankingDealers.co.za This test is not yet approved or cleared by the [place] FDA and has been authorized for detection and/or diagnosis of SARS-CoV-2 by FDA under an Emergency Use Authorization (EUA).  This EUA will remain in effect (meaning this test can be used) for the duration of the COVID-19 declaration under Section 564(b)(1) of the Act, 21 U.S.C. section 360bbb-3(b)(1), unless the authorization is terminated or revoked sooner. Performed at [facility], [address] [place], [place] [address]   MRSA PCR Screening     Status: None   Collection Time: 04/09/19  8:48 AM  Result Value Ref Range Status   MRSA by PCR NEGATIVE NEGATIVE Final    Comment:        The GeneXpert MRSA Assay (FDA approved for NASAL specimens only), is one component of a comprehensive MRSA colonization surveillance program. It is not intended to diagnose MRSA infection nor to guide or monitor treatment for MRSA infections. Performed at [facility], [address] [place], [place] [address]   CULTURE, BLOOD  (ROUTINE X 2) w Reflex to ID Panel     Status: None (Preliminary result)   Collection Time: 04/09/19  8:32 PM  Result Value Ref Range Status   Specimen Description BLOOD PORTA CATH  Final   Special Requests   Final    BOTTLES DRAWN AEROBIC AND ANAEROBIC Blood Culture results may not be optimal due to an excessive volume of blood received in culture bottles   Culture   Final    NO GROWTH < 12 HOURS Performed at [facility], [address], [place], [place] [address]    Report Status PENDING  Incomplete  CULTURE, BLOOD (ROUTINE X 2) w Reflex to ID Panel     Status: None (Preliminary result)   Collection Time: 04/09/19  9:02 PM  Result Value Ref Range Status   Specimen Description BLOOD LEFT HAND  Final   Special Requests   Final    BOTTLES DRAWN AEROBIC AND ANAEROBIC Blood Culture results may not be optimal due to an inadequate volume of blood received in culture bottles   Culture   Final    NO GROWTH < 12 HOURS Performed at [facility], [address] [place], [place]  [address]    Report Status PENDING  Incomplete          IMAGING    Dg Chest Port 1 View  Result Date: 04/10/2019 CLINICAL DATA:  Acute respiratory failure EXAM: PORTABLE CHEST 1 VIEW COMPARISON:  Two days ago FINDINGS: Cardiopericardial enlargement. New PICC with tip at the upper right atrium. Small pleural effusions and atelectasis. No evidence of pulmonary edema. IMPRESSION: 1. Stable cardiopericardial enlargement in this patient with pericardial effusion by recent CT. 2. Small pleural effusions with atelectasis. Electronically Signed   By: Monte Fantasia M.D.   On: 04/10/2019 05:02        ASSESSMENT AND PLAN SYNOPSIS  Acute AFIB WITH RVR responding to Amiodarone Follow up echo pending Follow up cardiology   Mild DT's responds to Oral valium CIWA scale  ELECTROLYTES -follow labs as needed -replace as needed -pharmacy consultation and following   DVT/GI PRX  ordered TRANSFUSIONS AS NEEDED MONITOR FSBS ASSESS the need for LABS as needed     Remains SD status Transfer to 2A in next 24 hrs   Patient/Family are satisfied with Plan of action and management. All questions answered  Corrin Parker, M.D.  [facility]  Medical Director [place] Director [facility] Cardio-Pulmonary Department

## 2019-04-10 NOTE — Progress Notes (Signed)
PHARMACY - PHYSICIAN COMMUNICATION CRITICAL VALUE ALERT - BLOOD CULTURE IDENTIFICATION (BCID)  Kenneth Copeland is an 53 y.o. male who presented to [facility] on [facility]/08/2019 with a chief complaint of chest pain shortness of breath.   Assessment:  Patient with stable pericardial effusion.   Name of physician (or Provider) Contacted: Kasa  Current antibiotics: none  Changes to prescribed antibiotics recommended:  Initiate vancomycin 2000mg  IV load x 1 followed by vancomycin 1750mg  IV Q12hr. Will obtain trough levels as indicated.    Results for orders placed or performed during the hospital encounter of 04/08/19  Blood Culture ID Panel (Reflexed) (Collected: 04/09/2019  8:32 PM)  Result Value Ref Range   Enterococcus species NOT DETECTED NOT DETECTED   Listeria monocytogenes NOT DETECTED NOT DETECTED   Staphylococcus species DETECTED (A) NOT DETECTED   Staphylococcus aureus (BCID) DETECTED (A) NOT DETECTED   Methicillin resistance DETECTED (A) NOT DETECTED   Streptococcus species NOT DETECTED NOT DETECTED   Streptococcus agalactiae NOT DETECTED NOT DETECTED   Streptococcus pneumoniae NOT DETECTED NOT DETECTED   Streptococcus pyogenes NOT DETECTED NOT DETECTED   Acinetobacter baumannii NOT DETECTED NOT DETECTED   Enterobacteriaceae species NOT DETECTED NOT DETECTED   Enterobacter cloacae complex NOT DETECTED NOT DETECTED   Escherichia coli NOT DETECTED NOT DETECTED   Klebsiella oxytoca NOT DETECTED NOT DETECTED   Klebsiella pneumoniae NOT DETECTED NOT DETECTED   Proteus species NOT DETECTED NOT DETECTED   Serratia marcescens NOT DETECTED NOT DETECTED   Haemophilus influenzae NOT DETECTED NOT DETECTED   Neisseria meningitidis NOT DETECTED NOT DETECTED   Pseudomonas aeruginosa NOT DETECTED NOT DETECTED   Candida albicans NOT DETECTED NOT DETECTED   Candida glabrata NOT DETECTED NOT DETECTED   Candida krusei NOT DETECTED NOT DETECTED   Candida parapsilosis NOT DETECTED NOT DETECTED   Candida tropicalis NOT DETECTED NOT DETECTED    Willadean Guyton L 04/10/2019  6:09 PM

## 2019-04-10 NOTE — Progress Notes (Addendum)
[facility] [place] NAME: Kenneth Copeland    MR#:  [id number]  DATE OF BIRTH:  [birth year]  SUBJECTIVE:   Patient was transferred to ICU yesterday due to tachycardia, hypotension and hypoxia.  Plan for was for possible pericardial window/pericardiocentesis but as per CT surgery he did not urgently required.  Patient has improved since yesterday.  His heart rate has improved on amiodarone drip and bolus.  He denies any worsening chest pain or shortness of breath presently.  REVIEW OF SYSTEMS:    Review of Systems  Constitutional: Negative for chills and fever.  HENT: Negative for congestion and tinnitus.   Eyes: Negative for blurred vision and double vision.  Respiratory: Negative for cough, shortness of breath and wheezing.   Cardiovascular: Negative for chest pain, orthopnea and PND.  Gastrointestinal: Negative for abdominal pain, diarrhea, nausea and vomiting.  Genitourinary: Negative for dysuria and hematuria.  Neurological: Negative for dizziness, sensory change and focal weakness.  All other systems reviewed and are negative.   Nutrition: Heart Healthy Tolerating Diet: Yes Tolerating PT: Await Eval.   DRUG ALLERGIES:   Allergies  Allergen Reactions  . Metformin And Related     Pt. States it makes his stomach bleed because of a stomach ulcer  . Motrin [Ibuprofen]     Pt. States it "inflames his stomach, causes bleeding in stomach"    VITALS:  Blood pressure 112/87, pulse 78, temperature (!) 97.4 F (36.3 C), temperature source Oral, resp. rate 17, height 5\' 11"  (1.803 m), weight 85.2 kg, SpO2 96 %.  PHYSICAL EXAMINATION:   Physical Exam  GENERAL:  53 y.o.-year-old patient lying in bed in NAD. EYES: Pupils equal, round, reactive to light and accommodation. No scleral icterus. Extraocular muscles intact.  HEENT: Head atraumatic, normocephalic. Oropharynx and nasopharynx clear.  NECK:  Supple, no jugular venous distention.  No thyroid enlargement, no tenderness.  LUNGS: Normal breath sounds bilaterally, no wheezing, rales, rhonchi. No use of accessory muscles of respiration.  CARDIOVASCULAR: S1, S2 normal. No murmurs, rubs, or gallops.  ABDOMEN: Soft, nontender, nondistended. Bowel sounds present. No organomegaly or mass.  EXTREMITIES: No cyanosis, clubbing or edema b/l.    NEUROLOGIC: Cranial nerves II through XII are intact. No focal Motor or sensory deficits b/l.   PSYCHIATRIC: The patient is alert and oriented x 3.  SKIN: No obvious rash, lesion, or ulcer.    LABORATORY PANEL:   CBC Recent Labs  Lab 04/09/19 0520  WBC 12.7*  HGB 8.0*  HCT 25.3*  PLT 364   ------------------------------------------------------------------------------------------------------------------  Chemistries  Recent Labs  Lab 04/08/19 0652  04/10/19 0852  NA 132*   < > 130*  K 4.1   < > 3.6  CL 97*   < > 96*  CO2 21*   < > 23  GLUCOSE 156*   < > 435*  BUN 9   < > 18  CREATININE 0.53*   < > 0.75  CALCIUM 8.3*   < > 7.8*  MG  --    < > 2.1  AST 25  --   --   ALT 29  --   --   ALKPHOS 144*  --   --   BILITOT 1.0  --   --    < > = values in this interval not displayed.   ------------------------------------------------------------------------------------------------------------------  Cardiac Enzymes Recent Labs  Lab 04/08/19 1924  TROPONINI <0.03   ------------------------------------------------------------------------------------------------------------------  RADIOLOGY:  Dg Chest [facility]  1 View  Result Date: 04/10/2019 CLINICAL DATA:  Acute respiratory failure EXAM: PORTABLE CHEST 1 VIEW COMPARISON:  Two days ago FINDINGS: Cardiopericardial enlargement. New PICC with tip at the upper right atrium. Small pleural effusions and atelectasis. No evidence of pulmonary edema. IMPRESSION: 1. Stable cardiopericardial enlargement in this patient with pericardial effusion by recent CT. 2. Small pleural effusions with  atelectasis. Electronically Signed   By: Monte Fantasia M.D.   On: 04/10/2019 05:02   [place] Ekg Site Rite  Result Date: 04/09/2019 If Site Rite image not attached, placement could not be confirmed due to current cardiac rhythm.    ASSESSMENT AND PLAN:   53 year old male with past medical history of liver cancer, osteoarthritis, diabetes, chronic pain, neuropathy who was just discharged from the hospital due to suspected sepsis and noted to have a trivial pericardial effusion now returns back due to chest pain and shortness of breath.  1.  Chest pain/shortness of breath- to be secondary to musculoskeletal/with underlying worsening pericardial effusion. - EKG showed low voltage throughout the precordial leads consistent with a pericardial effusion but no acute ST changes consistent with ischemia. -Patient's cardiac markers x3 were negative.  Seen by cardiology, echocardiogram showing moderate pericardial effusion but no tamponade physiology. -cont. Supportive care for now.    2.  Pericardial effusion- thought to have worsening pericardial effusion but etiology unclear.  No tamponade physiology and seen by CT surgery and they do not recommend urgent pericardial window/pericardiocentesis. - We will need to discuss with cardiology whether the patient needs a diagnostic pericardiocentesis. - Continue supportive care with pain control for now.  3.  Tachycardia-patient developed some atrial tachycardia/A. fib yesterday, and now has been started on amiodarone drip with bolus.  Much improved. -We will discuss with cardiology regarding weaning drip and starting on oral medications.  4. COPD - mild acute exacerbation.  - due to ongoing tobacco abuse.  - cont. Prednisone, duonebs.   5.  Chronic pain/neuropathy-patient has radiculopathy in the cervical spine and has chronic pain and neuropathy related to this. -Continue his gabapentin, Cymbalta and Percocet for now.  6.  Diabetes type 2 without  complication- cont. SSI and follow BS.   7.  GERD-continue Protonix.    8.  BPH-continue Flomax.  9.  History of gout-no acute attack.  Continue colchicine.  Possible transfer to floor today from ICU when bed available.    All the records are reviewed and case discussed with Care Management/Social Worker. Management plans discussed with the patient, family and they are in agreement.  CODE STATUS: Full code  DVT Prophylaxis: Lovenox  TOTAL TIME TAKING CARE OF THIS PATIENT: 30 minutes.   POSSIBLE D/C IN 1-2 DAYS, DEPENDING ON CLINICAL CONDITION.   Henreitta Leber M.D on 04/10/2019 at 2:32 PM  Between 7am to 6pm - Pager - [phone]  After 6pm go to [email or web address] - Proofreader  [facility]  Office  [phone]  CC: Primary care physician; Patient, No Pcp Per

## 2019-04-10 NOTE — Progress Notes (Signed)
Pharmacy Electrolyte Monitoring Consult:  Pharmacy consulted to assist in monitoring and replacing electrolytes in this 53 y.o. male admitted on 04/08/2019 with Shortness of Breath and Chest Pain   Labs:  Sodium (mmol/L)  Date Value  04/10/2019 130 (L)   Potassium (mmol/L)  Date Value  04/10/2019 3.6   Magnesium (mg/dL)  Date Value  04/10/2019 2.1   Phosphorus (mg/dL)  Date Value  04/09/2019 2.9   Calcium (mg/dL)  Date Value  04/10/2019 7.8 (L)   Albumin (g/dL)  Date Value  04/08/2019 3.0 (L)    Assessment/Plan: Will order potassium 68mEq PO x 1. f  Will obtain follow up BMP/Magnesium with am labs.   Will replace for goal potassium ~ 4 and goal magnesium ~ 2.   Pharmacy will continue to monitor and adjust per consult.   Kenneth Copeland L 04/10/2019 6:17 PM

## 2019-04-10 NOTE — Plan of Care (Signed)
Pt in NSR

## 2019-04-10 NOTE — Progress Notes (Signed)
  [name], male   DOB: 10/09/1966, 53 y.o.   MRN: [id number]  HISTORY: Overall he states that he feels better today.  He is not short of breath.  He continues to complain of mid chest pain particularly with deep inspiration.  He converted to sinus rhythm yesterday and since his blood pressure is been much more stable in the 120/80 range.  His heart rate has been in the 80s and his oxygen saturations are in the high 90s.     Vitals:   04/10/19 0719 04/10/19 0800  BP:  108/75  Pulse:  84  Resp:    Temp:    SpO2: 97% 94%     EXAM:    Resp: Lungs are clear bilaterally.  No respiratory distress, normal effort. Heart:  Regular without murmurs Abd:  Abdomen is soft, non distended and non tender. No masses are palpable.  There is no rebound and no guarding.  Neurological: Alert and oriented to person, place, and time. Coordination normal.  Skin: Skin is warm and dry. No rash noted. No diaphoretic. No erythema. No pallor.  Psychiatric: Normal mood and affect. Normal behavior. Judgment and thought content normal.    ASSESSMENT/PLAN: Pericardial effusion.  At the present time he does not appear to be in any hemodynamic distress.  Would continue to monitor.  History of hepatocellular carcinoma or cholangiocarcinoma.  I would ask oncology to see as this may give some insight into the etiology of his pericardial effusion.      [name], MDPatient ID: Kenneth Copeland, male   DOB: [birth year], 53 y.o.   MRN: [id number]

## 2019-04-11 ENCOUNTER — Inpatient Hospital Stay
Admit: 2019-04-11 | Discharge: 2019-04-11 | Disposition: A | Discharge status: Home / Self Care | Payer: Medicare (Managed Care) | Attending: Internal Medicine | Admitting: Internal Medicine

## 2019-04-11 ENCOUNTER — Encounter
Admission: EM | Disposition: A | Discharge status: Skilled Nursing Facility | Payer: Self-pay | Source: Home / Self Care | Attending: Internal Medicine

## 2019-04-11 DIAGNOSIS — E118 Type 2 diabetes mellitus with unspecified complications: Secondary | ICD-10-CM

## 2019-04-11 DIAGNOSIS — Z95828 Presence of other vascular implants and grafts: Secondary | ICD-10-CM

## 2019-04-11 DIAGNOSIS — Z872 Personal history of diseases of the skin and subcutaneous tissue: Secondary | ICD-10-CM

## 2019-04-11 DIAGNOSIS — Z8614 Personal history of Methicillin resistant Staphylococcus aureus infection: Secondary | ICD-10-CM

## 2019-04-11 DIAGNOSIS — Z9089 Acquired absence of other organs: Secondary | ICD-10-CM

## 2019-04-11 DIAGNOSIS — Z8719 Personal history of other diseases of the digestive system: Secondary | ICD-10-CM

## 2019-04-11 DIAGNOSIS — F1910 Other psychoactive substance abuse, uncomplicated: Secondary | ICD-10-CM

## 2019-04-11 HISTORY — PX: TEE WITHOUT CARDIOVERSION: SHX5443

## 2019-04-11 LAB — SEDIMENTATION RATE: Sed Rate: 129 mm/hr — ABNORMAL HIGH (ref 0–20)

## 2019-04-11 LAB — BASIC METABOLIC PANEL
Anion gap: 8 (ref 5–15)
BUN: 16 mg/dL (ref 6–20)
CO2: 25 mmol/L (ref 22–32)
Calcium: 8.1 mg/dL — ABNORMAL LOW (ref 8.9–10.3)
Chloride: 99 mmol/L (ref 98–111)
Creatinine, Ser: 0.59 mg/dL — ABNORMAL LOW (ref 0.61–1.24)
GFR calc Af Amer: >60 mL/min (ref >60)
GFR calc non Af Amer: >60 mL/min (ref >60)
Glucose, Bld: 303 mg/dL — ABNORMAL HIGH (ref 70–99)
Potassium: 3.9 mmol/L (ref 3.5–5.1)
Sodium: 132 mmol/L — ABNORMAL LOW (ref 135–145)

## 2019-04-11 LAB — CBC
HCT: 24.1 % — ABNORMAL LOW (ref 39.0–52.0)
Hemoglobin: 7.4 g/dL — ABNORMAL LOW (ref 13.0–17.0)
MCH: 27.2 pg (ref 26.0–34.0)
MCHC: 30.7 g/dL (ref 30.0–36.0)
MCV: 88.6 fL (ref 80.0–100.0)
Platelets: 357 10*3/uL (ref 150–400)
RBC: 2.72 MIL/uL — ABNORMAL LOW (ref 4.22–5.81)
RDW: 14.9 % (ref 11.5–15.5)
WBC: 11.3 10*3/uL — ABNORMAL HIGH (ref 4.0–10.5)
nRBC: 0 % (ref 0.0–0.2)

## 2019-04-11 LAB — GLUCOSE, CAPILLARY
Glucose-Capillary: 277 mg/dL — ABNORMAL HIGH (ref 70–99)
Glucose-Capillary: 290 mg/dL — ABNORMAL HIGH (ref 70–99)
Glucose-Capillary: 283 mg/dL — ABNORMAL HIGH (ref 70–99)
Glucose-Capillary: 207 mg/dL — ABNORMAL HIGH (ref 70–99)
Glucose-Capillary: 155 mg/dL — ABNORMAL HIGH (ref 70–99)
Glucose-Capillary: 275 mg/dL — ABNORMAL HIGH (ref 70–99)

## 2019-04-11 LAB — CK: Total CK: 17 U/L — ABNORMAL LOW (ref 49–397)

## 2019-04-11 LAB — PROCALCITONIN: Procalcitonin: 0.65 ng/mL

## 2019-04-11 LAB — URINE CULTURE: Culture: NO GROWTH

## 2019-04-11 LAB — MAGNESIUM: Magnesium: 2.2 mg/dL (ref 1.7–2.4)

## 2019-04-11 LAB — FERRITIN: Ferritin: 168 ng/mL (ref 24–336)

## 2019-04-11 SURGERY — ECHOCARDIOGRAM, TRANSESOPHAGEAL
Anesthesia: Moderate Sedation

## 2019-04-11 MED ORDER — FENTANYL CITRATE (PF) 100 MCG/2ML IJ SOLN
INTRAMUSCULAR | Status: AC
  Filled 2019-04-11: qty 2

## 2019-04-11 MED ORDER — LIDOCAINE VISCOUS HCL 2 % MT SOLN
OROMUCOSAL | Status: AC | PRN
  Administered 2019-04-11: 15 mL via OROMUCOSAL

## 2019-04-11 MED ORDER — LIDOCAINE VISCOUS HCL 2 % MT SOLN
OROMUCOSAL | Status: AC
  Filled 2019-04-11: qty 15

## 2019-04-11 MED ORDER — MIDAZOLAM HCL 5 MG/5ML IJ SOLN
INTRAMUSCULAR | Status: AC
  Filled 2019-04-11: qty 5

## 2019-04-11 MED ORDER — AMIODARONE HCL 200 MG PO TABS
200.0000 mg | ORAL_TABLET | Freq: Two times a day (BID) | ORAL | Status: DC
  Administered 2019-04-11 – 2019-04-24 (×27): 200 mg via ORAL
  Filled 2019-04-11 (×28): qty 1

## 2019-04-11 MED ORDER — LIDOCAINE HCL (PF) 2 % IJ SOLN
0.0000 mL | Freq: Once | INTRAMUSCULAR | Status: DC | PRN
  Filled 2019-04-11: qty 20

## 2019-04-11 MED ORDER — MIDAZOLAM HCL 5 MG/5ML IJ SOLN
INTRAMUSCULAR | Status: AC | PRN
  Administered 2019-04-11: 1 mg via INTRAVENOUS
  Administered 2019-04-11: 2 mg via INTRAVENOUS

## 2019-04-11 MED ORDER — BUTAMBEN-TETRACAINE-BENZOCAINE 2-2-14 % EX AERO
INHALATION_SPRAY | CUTANEOUS | Status: AC
  Filled 2019-04-11: qty 5

## 2019-04-11 MED ORDER — VANCOMYCIN HCL 1.5 G IV SOLR
1500.0000 mg | Freq: Two times a day (BID) | INTRAVENOUS | Status: DC
  Administered 2019-04-12 – 2019-04-14 (×5): 1500 mg via INTRAVENOUS
  Filled 2019-04-11 (×10): qty 1500

## 2019-04-11 MED ORDER — FENTANYL CITRATE (PF) 100 MCG/2ML IJ SOLN
INTRAMUSCULAR | Status: AC | PRN
  Administered 2019-04-11: 50 ug via INTRAVENOUS
  Administered 2019-04-11: 25 ug via INTRAVENOUS

## 2019-04-11 MED ORDER — SODIUM CHLORIDE FLUSH 0.9 % IV SOLN
INTRAVENOUS | Status: AC
  Filled 2019-04-11: qty 10

## 2019-04-11 MED ORDER — DOCUSATE SODIUM 100 MG PO CAPS
100.0000 mg | ORAL_CAPSULE | Freq: Every day | ORAL | Status: DC | PRN
  Administered 2019-04-11 – 2019-04-25 (×2): 100 mg via ORAL
  Filled 2019-04-11 (×2): qty 1

## 2019-04-11 MED ORDER — SODIUM CHLORIDE 0.9 % IV SOLN
INTRAVENOUS | Status: DC
  Administered 2019-04-11: 09:00:00 via INTRAVENOUS

## 2019-04-11 MED ORDER — BUTAMBEN-TETRACAINE-BENZOCAINE 2-2-14 % EX AERO
INHALATION_SPRAY | CUTANEOUS | Status: AC | PRN
  Administered 2019-04-11: 13:00:00 9 via TOPICAL

## 2019-04-11 NOTE — Progress Notes (Signed)
Pharmacy Antibiotic Note  Kenneth Copeland is a 53 y.o. male admitted on 04/08/2019 with chest pain and shortness of breath. Pharmacy has been consulted for vancomycin dosing.  Blood cultures with 2/4 bottles MRSA. TEE negative for endocarditis.  Plan: Patient received 2000 mg IV x 1 followed by a dose of 1750 mg q12h (received one dose). Patient was on vanc during recent hospitalization at [facility] From review of records, it appears 1250 mg q12h may be too low of a dose based on levels performed at their hospital, but question if 1750 may be on the higher end.  Will change to vanc 1500 mg IV q12h.  Expected AUC: 479.2 SCr: 0.8  Height: 5\' 11"  (180.3 cm) Weight: 187 lb 13.3 oz (85.2 kg) IBW/kg (Calculated) : 75.3  Temp (24hrs), Avg:98 F (36.7 C), Min:97.8 F (36.6 C), Max:98.3 F (36.8 C)  Recent Labs  Lab 04/05/19 0502 04/08/19 0652 04/09/19 0520 04/10/19 0852 04/11/19 0519  WBC 7.2 10.5 12.7*  --  11.3*  CREATININE 0.74 0.53* 0.58* 0.75 0.59*    Estimated Creatinine Clearance: 117.7 mL/min (A) (by C-G formula based on SCr of 0.59 mg/dL (L)).    Allergies  Allergen Reactions  . Metformin And Related     Pt. States it makes his stomach bleed because of a stomach ulcer  . Motrin [Ibuprofen]     Pt. States it "inflames his stomach, causes bleeding in stomach"    Antimicrobials this admission: Vancomycin 5/19 >>  Dose adjustments this admission: 5/20 Vanc 1750 mg q12h >> 1500 mg q12h  Microbiology results: 5/18 BCx: 2/4 bottles MRSA 5/19 UCx: no growth  5/18 MRSA PCR: negative 5/17 SARS-CoV-2: negative  Thank you for allowing pharmacy to be a part of this patient's care.  Tawnya Crook, PharmD Pharmacy Resident  04/11/2019 3:23 PM

## 2019-04-11 NOTE — Progress Notes (Signed)
Inpatient Diabetes Program Recommendations  AACE/ADA: New Consensus Statement on Inpatient Glycemic Control   Target Ranges:  Prepandial:   less than 140 mg/dL      Peak postprandial:   less than 180 mg/dL (1-2 hours)      Critically ill patients:  140 - 180 mg/dL   Results for Kenneth Copeland, Kenneth Copeland (MRN [id number]) as of 04/11/2019 09:22  Ref. Range 04/10/2019 07:38 04/10/2019 11:14 04/10/2019 13:01 04/10/2019 16:37 04/10/2019 19:58 04/10/2019 21:11 04/10/2019 23:35 04/11/2019 04:38 04/11/2019 07:44  Glucose-Capillary Latest Ref Range: 70 - 99 mg/dL 320 (H) 470 (H) 392 (H) 411 (H) 404 (H) 390 (H) 345 (H) 277 (H) 290 (H)   Review of Glycemic Control  Diabetes history:DM2 Outpatient Diabetes medications:Lantus 40 units BID, Humalog 10 units TID with meals plus additional units for correction when needed Current orders for Inpatient glycemic control:Lantus 40 units BID, Novolog 0-20 units, Novolog 0-5 units QHS: Prednisone 40 mg QAM   Inpatient Diabetes Program Recommendations:   Insulin-Meal Coverage: Once diet is resumed, please consider ordering Novolog 10 units TID with meals for meal coverage if patient eats at least 50% of meals.  NOTE: Noted Lantus increased to 40 units BID on 04/10/19 and steroids changed to Prednisone.  Thanks, Barnie Alderman, RN, MSN, CDE Diabetes Coordinator Inpatient Diabetes Program [phone] (Team Pager from 8am to 5pm)

## 2019-04-11 NOTE — Progress Notes (Signed)
[name], male   DOB: 08-28-1966, 53 y.o.   MRN: [id number]  I had the opportunity today to meet with Kenneth Copeland.  He states that he feels better today.  He still has some substernal chest pain but he is not short of breath and overall he feels much improved.  I have been in communication with our cardiology and infectious disease consultants.  There is some concern that he may have MRSA pericarditis.  His blood cultures are now positive for MRSA.  The patient relates that prior history of an infected IV site and a severe nasal infection within the last couple weeks.  These were all at an outside hospital.  I discussed his care today with Dr. Clayborn Bigness.  There is some concern about endocarditis as well and a TEE or a trans-a repeat echo by transthoracic or transesophageal route has been planned.  In addition a percutaneous sampling or drainage of the pericardium is also planned.  I did describe to the patient that it may be possible to perform a pericardial window at our institution but that I was unable to perform a sternotomy here and may not be able to adequately debride the pericardial space if indeed he does have bacterial pericarditis.  He understands that.  I will await the results of the echocardiogram and pericardiocentesis or drainage.

## 2019-04-11 NOTE — Progress Notes (Signed)
[facility] [place] NAME: Kenneth Copeland    MR#:  [id number]  DATE OF BIRTH:  18-Nov-1966  SUBJECTIVE:   Heart rates are much stable and patient has been weaned off the amiodarone drip.  Patient was incidentally also noted to have MRSA bacteremia and source remains unclear.  Currently on IV vancomycin.  Patient is afebrile and otherwise hemodynamically stable.  No other complaints.  REVIEW OF SYSTEMS:    Review of Systems  Constitutional: Negative for chills and fever.  HENT: Negative for congestion and tinnitus.   Eyes: Negative for blurred vision and double vision.  Respiratory: Negative for cough, shortness of breath and wheezing.   Cardiovascular: Negative for chest pain, orthopnea and PND.  Gastrointestinal: Negative for abdominal pain, diarrhea, nausea and vomiting.  Genitourinary: Negative for dysuria and hematuria.  Neurological: Negative for dizziness, sensory change and focal weakness.  All other systems reviewed and are negative.   Nutrition: Heart Healthy Tolerating Diet: Yes Tolerating PT: Await Eval.   DRUG ALLERGIES:   Allergies  Allergen Reactions  . Metformin And Related     Pt. States it makes his stomach bleed because of a stomach ulcer  . Motrin [Ibuprofen]     Pt. States it "inflames his stomach, causes bleeding in stomach"    VITALS:  Blood pressure 126/88, pulse 84, temperature 97.8 F (36.6 C), temperature source Oral, resp. rate 16, height 5\' 11"  (1.803 m), weight 85.2 kg, SpO2 92 %.  PHYSICAL EXAMINATION:   Physical Exam  GENERAL:  53 y.o.-year-old patient lying in bed in NAD. EYES: Pupils equal, round, reactive to light and accommodation. No scleral icterus. Extraocular muscles intact.  HEENT: Head atraumatic, normocephalic. Oropharynx and nasopharynx clear.  NECK:  Supple, no jugular venous distention. No thyroid enlargement, no tenderness.  LUNGS: Normal breath sounds bilaterally, no wheezing,  rales, rhonchi. No use of accessory muscles of respiration.  CARDIOVASCULAR: S1, S2 normal. No murmurs, rubs, or gallops.  ABDOMEN: Soft, nontender, nondistended. Bowel sounds present. No organomegaly or mass.  EXTREMITIES: No cyanosis, clubbing or edema b/l.    NEUROLOGIC: Cranial nerves II through XII are intact. No focal Motor or sensory deficits b/l.   PSYCHIATRIC: The patient is alert and oriented x 3.  SKIN: No obvious rash, lesion, or ulcer.    LABORATORY PANEL:   CBC Recent Labs  Lab 04/11/19 0519  WBC 11.3*  HGB 7.4*  HCT 24.1*  PLT 357   ------------------------------------------------------------------------------------------------------------------  Chemistries  Recent Labs  Lab 04/08/19 0652  04/11/19 0519  NA 132*   < > 132*  K 4.1   < > 3.9  CL 97*   < > 99  CO2 21*   < > 25  GLUCOSE 156*   < > 303*  BUN 9   < > 16  CREATININE 0.53*   < > 0.59*  CALCIUM 8.3*   < > 8.1*  MG  --    < > 2.2  AST 25  --   --   ALT 29  --   --   ALKPHOS 144*  --   --   BILITOT 1.0  --   --    < > = values in this interval not displayed.   ------------------------------------------------------------------------------------------------------------------  Cardiac Enzymes Recent Labs  Lab 04/08/19 1924  TROPONINI <0.03   ------------------------------------------------------------------------------------------------------------------  RADIOLOGY:  Dg Chest Port 1 View  Result Date: 04/10/2019 CLINICAL DATA:  Acute respiratory failure EXAM: PORTABLE CHEST  1 VIEW COMPARISON:  Two days ago FINDINGS: Cardiopericardial enlargement. New PICC with tip at the upper right atrium. Small pleural effusions and atelectasis. No evidence of pulmonary edema. IMPRESSION: 1. Stable cardiopericardial enlargement in this patient with pericardial effusion by recent CT. 2. Small pleural effusions with atelectasis. Electronically Signed   By: Monte Fantasia M.D.   On: 04/10/2019 05:02      ASSESSMENT AND PLAN:   53 year old male with past medical history of liver cancer, osteoarthritis, diabetes, chronic pain, neuropathy who was just discharged from the hospital due to suspected sepsis and noted to have a trivial pericardial effusion now returns back due to chest pain and shortness of breath.  1.  Chest pain/shortness of breath- to be secondary to musculoskeletal/with underlying worsening pericardial effusion. -Patient's cardiac markers x3 were negative.  Seen by cardiology, echocardiogram showing moderate pericardial effusion but no tamponade physiology. -cont. Supportive care for now.    2.  MRSA bacteremia-this was incidentally noted as patient had some blood cultures drawn 2 days ago.  Source of the bacteremia remains unclear.  Seen by infectious disease, continue IV vancomycin. -Status post TEE today showing no evidence of vegetation or endocarditis. - Questionable pericardial effusion is also infected and therefore await diagnostic pericardiocentesis as per cardiology. -Patient will likely need long-term IV antibiotics and eventually need a PICC line. Follow repeat BC to make sure they are clearing.  Continue further care as per infectious disease.  3.  Pericardial effusion- thought to have worsening pericardial effusion but etiology unclear.  No tamponade physiology and seen by CT surgery and they do not recommend urgent pericardial window/pericardiocentesis. - await Diagnostic pericardiocentesis  3.  Tachycardia -this was atrial tachycardia/A. fib.  Much improved.  Was on amiodarone drip but now weaned off and currently on oral amiodarone.  5. COPD - mild acute exacerbation.  - due to ongoing tobacco abuse.  - cont. Prednisone, duonebs.   6.  Chronic pain/neuropathy-patient has radiculopathy in the cervical spine and has chronic pain and neuropathy related to this. -Continue his gabapentin, Cymbalta and Percocet for now.  7.  Diabetes type 2 without  complication- cont. SSI  - BS stable.   8.  GERD-continue Protonix.    9.  BPH-continue Flomax.  10.  History of gout-no acute attack.  Continue colchicine.   All the records are reviewed and case discussed with Care Management/Social Worker. Management plans discussed with the patient, family and they are in agreement.  CODE STATUS: Full code  DVT Prophylaxis: Lovenox  TOTAL TIME TAKING CARE OF THIS PATIENT: 30 minutes.   POSSIBLE D/C unclear, DEPENDING ON CLINICAL CONDITION and progress.   Henreitta Leber M.D on 04/11/2019 at 2:44 PM  Between 7am to 6pm - Pager - [phone]  After 6pm go to [email or web address] - Proofreader  [facility]  Office  [phone]  CC: Primary care physician; Patient, No Pcp Per

## 2019-04-11 NOTE — CV Procedure (Signed)
Transesophageal echocardiogram preliminary report  Fitzpatrick Alberico [id number] [birth year]  Preliminary diagnosis  Bacteremia with possible endocarditis  Postprocedural diagnosis Normal LV function without evidence of endocarditis or vegetation  Time out A timeout was performed by the nursing staff and physicians specifically identifying the procedure performed, identification of the patient, the type of sedation, all allergies and medications, all pertinent medical history, and presedation assessment of nasopharynx. The patient and or family understand the risks of the procedure including the rare risks of death, stroke, heart attack, esophogeal perforation, sore throat, and reaction to medications given.  Moderate sedation During this procedure the patient has received Versed 3 milligrams and fentanyl 50 micrograms to achieve appropriate moderate sedation.  The patient had continued monitoring of heart rate, oxygenation, blood pressure, respiratory rate, and extent of signs of sedation throughout the entire procedure.  The patient received this moderate sedation over a period of 18 minutes.  Both the nursing staff and I were present during the procedure when the patient had moderate sedation for 100% of the time.  Treatment considerations  No additional treatment considerations needed for bacteremia due to no current evidence of endocarditis  For further details of transesophageal echocardiogram please refer to final report.  Signed,  Corey Skains M.D. [facility] 04/11/2019 1:46 PM

## 2019-04-11 NOTE — Sedation Documentation (Signed)
O2 up to 4 L/[place]: o2 sat. Up to 93%

## 2019-04-11 NOTE — Progress Notes (Signed)
Date of Admission:  04/08/2019       Subjective: No fever Says he is feeling a little less chest pain Had TEE- neg for vegetation  Medications:  . [MAR Hold] amiodarone  200 mg Oral BID  . butamben-tetracaine-benzocaine      . [MAR Hold] colchicine  0.6 mg Oral BID  . [MAR Hold] dicyclomine  10 mg Oral QID  . [MAR Hold] DULoxetine  30 mg Oral Daily  . fentaNYL      . [MAR Hold] insulin aspart  0-20 Units Subcutaneous TID WC  . [MAR Hold] insulin aspart  0-5 Units Subcutaneous QHS  . [MAR Hold] insulin glargine  40 Units Subcutaneous BID  . lidocaine      . [MAR Hold] metoCLOPramide  10 mg Oral TID AC & HS  . midazolam      . [MAR Hold] neomycin-polymyxin b-dexamethasone  1 drop Left Eye Q6H  . [MAR Hold] pantoprazole  40 mg Oral BID  . [MAR Hold] polyvinyl alcohol  2 drop Left Eye TID  . [MAR Hold] predniSONE  40 mg Oral Q breakfast  . [MAR Hold] sodium chloride flush  10-40 mL Intracatheter Q12H  . sodium chloride flush      . [MAR Hold] sucralfate  1 g Oral QID  . [MAR Hold] tamsulosin  0.4 mg Oral Daily    Objective: Vital signs in last 24 hours: Temp:  [97.8 F (36.6 C)-98.3 F (36.8 C)] 97.8 F (36.6 C) (05/20 0757) Pulse Rate:  [68-92] 83 (05/20 1314) Resp:  [13-23] 16 (05/20 1314) BP: (102-129)/(75-89) 127/84 (05/20 1314) SpO2:  [90 %-96 %] 94 % (05/20 1314)  PHYSICAL EXAM:  General: Alert, cooperative, no distress, appears stated age.  Lungs: b/l air entry. Heart: s1s2 Abdomen: Soft, non-tender,not distended. Extremities: rt PICC Skin: No rashes or lesions. Or bruising Lymph: Cervical, supraclavicular normal. Neurologic: Grossly non-focal  Lab Results Recent Labs    04/09/19 0520 04/10/19 0852 04/11/19 0519  WBC 12.7*  --  11.3*  HGB 8.0*  --  7.4*  HCT 25.3*  --  24.1*  NA 130* 130* 132*  K 3.9 3.6 3.9  CL 96* 96* 99  CO2 22 23 25   BUN 12 18 16   CREATININE 0.58* 0.75 0.59*   Liver Panel No results for input(s): PROT, ALBUMIN, AST, ALT,  ALKPHOS, BILITOT, BILIDIR, IBILI in the last 72 hours. Sedimentation Rate Recent Labs    04/11/19 0519  ESRSEDRATE 129*   C-Reactive Protein No results for input(s): CRP in the last 72 hours.  Microbiology: 5/17 [facility]- MRSA Studies/Results: Dg Chest Port 1 View  Result Date: 04/10/2019 CLINICAL DATA:  Acute respiratory failure EXAM: PORTABLE CHEST 1 VIEW COMPARISON:  Two days ago FINDINGS: Cardiopericardial enlargement. New PICC with tip at the upper right atrium. Small pleural effusions and atelectasis. No evidence of pulmonary edema. IMPRESSION: 1. Stable cardiopericardial enlargement in this patient with pericardial effusion by recent CT. 2. Small pleural effusions with atelectasis. Electronically Signed   By: Monte Fantasia M.D.   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  MRSA bacteremia- unclear source-possible he is colonized with it In Jan 2022 he had cellulitis of the abdomen at the insulin injection site and got bactrim In feb 2020 he had cellulitis and thrombophlebitis at the IV site at [facility]  and given bactrim againa In April 2020 hus urine culture had MRSA ( blood culture neg) and he got bactrim again for possible prostatitis  TEE Negative Will need to image his spine ( cervical done on 5/12) will need lumbar and thoracic-spine to look for any abscess/discitis  Also would recommend CT abd/pelvis Continue vanco, repeat Blood culture until clear of bacteremia  Pericardial effusion ? Viral? Bacterial? Malignant ? autoimmune Will check parvovirus, HHV6, EBV, CMV, HIV(neg) , COVID 19 ( negative),  As per cardiologist was not enough to do aspiration   Malignant pericardial effusion?? With h/o hepatic carcinoma and  s/p hepatectomy and chemo this should be entertained but remote  Anemia - ? 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

## 2019-04-11 NOTE — Progress Notes (Signed)
*  PRELIMINARY RESULTS* Echocardiogram Echocardiogram Transesophageal. has been performed.  Kenneth Copeland 04/11/2019, 1:36 PM

## 2019-04-11 NOTE — Progress Notes (Signed)
Pharmacy Electrolyte Monitoring Consult:  Pharmacy consulted to assist in monitoring and replacing electrolytes in this 53 y.o. male admitted on 04/08/2019 with Shortness of Breath and Chest Pain   Labs:  Sodium (mmol/L)  Date Value  04/11/2019 132 (L)   Potassium (mmol/L)  Date Value  04/11/2019 3.9   Magnesium (mg/dL)  Date Value  04/11/2019 2.2   Phosphorus (mg/dL)  Date Value  04/09/2019 2.9   Calcium (mg/dL)  Date Value  04/11/2019 8.1 (L)   Albumin (g/dL)  Date Value  04/08/2019 3.0 (L)    Assessment/Plan: No replacement warranted.   Will obtain follow up BMP/Magnesium with am labs.   Will replace for goal potassium ~ 4 and goal magnesium ~ 2.   Pharmacy will continue to monitor and adjust per consult.   Simpson,Michael L 04/11/2019 4:45 PM

## 2019-04-12 ENCOUNTER — Encounter: Payer: Self-pay | Admitting: Internal Medicine

## 2019-04-12 ENCOUNTER — Inpatient Hospital Stay: Payer: Medicare (Managed Care)

## 2019-04-12 LAB — GLUCOSE, CAPILLARY
Glucose-Capillary: 240 mg/dL — ABNORMAL HIGH (ref 70–99)
Glucose-Capillary: 228 mg/dL — ABNORMAL HIGH (ref 70–99)
Glucose-Capillary: 263 mg/dL — ABNORMAL HIGH (ref 70–99)
Glucose-Capillary: 315 mg/dL — ABNORMAL HIGH (ref 70–99)

## 2019-04-12 LAB — CULTURE, BLOOD (ROUTINE X 2)

## 2019-04-12 LAB — CBC
HCT: 25.2 % — ABNORMAL LOW (ref 39.0–52.0)
Hemoglobin: 7.8 g/dL — ABNORMAL LOW (ref 13.0–17.0)
MCH: 27.2 pg (ref 26.0–34.0)
MCHC: 31 g/dL (ref 30.0–36.0)
MCV: 87.8 fL (ref 80.0–100.0)
Platelets: 409 10*3/uL — ABNORMAL HIGH (ref 150–400)
RBC: 2.87 MIL/uL — ABNORMAL LOW (ref 4.22–5.81)
RDW: 14.9 % (ref 11.5–15.5)
WBC: 8.9 10*3/uL (ref 4.0–10.5)
nRBC: 0 % (ref 0.0–0.2)

## 2019-04-12 LAB — ANA COMPREHENSIVE PANEL
Anti JO-1: <0.2 AI (ref 0.0–0.9)
Centromere Ab Screen: <0.2 AI (ref 0.0–0.9)
Chromatin Ab SerPl-aCnc: <0.2 AI (ref 0.0–0.9)
ENA SM Ab Ser-aCnc: <0.2 AI (ref 0.0–0.9)
Ribonucleic Protein: <0.2 AI (ref 0.0–0.9)
SSA (Ro) (ENA) Antibody, IgG: <0.2 AI (ref 0.0–0.9)
SSB (La) (ENA) Antibody, IgG: <0.2 AI (ref 0.0–0.9)
Scleroderma (Scl-70) (ENA) Antibody, IgG: <0.2 AI (ref 0.0–0.9)
ds DNA Ab: 1 IU/mL (ref 0–9)

## 2019-04-12 LAB — BASIC METABOLIC PANEL
Anion gap: 12 (ref 5–15)
BUN: 17 mg/dL (ref 6–20)
CO2: 23 mmol/L (ref 22–32)
Calcium: 7.9 mg/dL — ABNORMAL LOW (ref 8.9–10.3)
Chloride: 98 mmol/L (ref 98–111)
Creatinine, Ser: 0.68 mg/dL (ref 0.61–1.24)
GFR calc Af Amer: >60 mL/min (ref >60)
GFR calc non Af Amer: >60 mL/min (ref >60)
Glucose, Bld: 378 mg/dL — ABNORMAL HIGH (ref 70–99)
Potassium: 3.9 mmol/L (ref 3.5–5.1)
Sodium: 133 mmol/L — ABNORMAL LOW (ref 135–145)

## 2019-04-12 LAB — PSA: Prostatic Specific Antigen: 1.27 ng/mL (ref 0.00–4.00)

## 2019-04-12 MED ORDER — ENOXAPARIN SODIUM 40 MG/0.4ML ~~LOC~~ SOLN
40.0000 mg | Freq: Every day | SUBCUTANEOUS | Status: DC
  Administered 2019-04-12 – 2019-04-25 (×14): 40 mg via SUBCUTANEOUS
  Filled 2019-04-12 (×14): qty 0.4

## 2019-04-12 MED ORDER — INSULIN ASPART 100 UNIT/ML ~~LOC~~ SOLN
5.0000 [IU] | Freq: Three times a day (TID) | SUBCUTANEOUS | Status: DC
  Administered 2019-04-12 – 2019-04-25 (×27): 5 [IU] via SUBCUTANEOUS
  Filled 2019-04-12 (×27): qty 1

## 2019-04-12 MED ORDER — GADOBUTROL 1 MMOL/ML IV SOLN
8.0000 mL | Freq: Once | INTRAVENOUS | Status: AC | PRN
  Administered 2019-04-12: 8 mL via INTRAVENOUS

## 2019-04-12 NOTE — Progress Notes (Signed)
Family update: Patient does not wish for me to call anyone with an update at this time. Will cont to monitor.

## 2019-04-12 NOTE — Progress Notes (Signed)
Date of Admission:  04/08/2019              Kenneth Copeland is a 53 y.o. 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 Pt was recently in [facility] between 5/10-5/14 for same complaint and was diagnosed with pericardial effusion.  Patient has a complicated medical history.  HE has a h/o poorly controlled DM, cocaine use.  He moved from [place] to [place] in April. He initially presented to [facility] ED on 4/2 with b/l flank pain and hematuria and was discharged from ED on cipro for 5 daysafter the CT abdomen did not show any renal stones.  He went back and was admitted to [facility] between 03/13/2019 to 03/19/2019 , with diarrhea vomiting stomach pain and diffuse myalgia of 4 days duration.  On presentation the ED he was afebrile with no leukocytosis however had an elevated lactate of 5.7 and persistent tachycardia.  He was initially treated empirically with ceftriaxone and Flagyl.  COVID testing was negative.  But once he was transferred to the medical floor he had a temperature of 102.2 rectal.  And his antibiotic was broadened to Vanco cefepime and Flagyl.  During that hospitalization his urine culture was positive for MRSA but  blood cultures were negative both on 03/14/2019 and 03/15/2019.  C. difficile was negative, urine Legionella was negative, CK was high ( max 1223) and ESR was 86 and CRP 12 ,  CXR was normal  with No focal consolidation, pneumothorax or pleural effusion. Cardiomediastinal silhouette and pulmonary vasculature were within normal limits. .CT abdomen revealed proctitis and also large periportal, perigastric, perisplenic varices secondary to chronic splenic vein occlusion.Enlarged prostate with scattered prostatic calcifications  was noted and  mild circumferential wall edema and mucosal enhancement of the rectum without significant mesorectal inflammation, suggestive of mild proctitis.digital rectal examination was concerning for prostatitis.  He was discharged on  Bactrim for 28-day course to complete until 04/14/2019  Pt continues to use cocaine -says he stuffs it in his mouth at the site of dental cavitis which is painful. HE denies IV DA   Subjective: Out of ICU Feeling slightly better- chest less spainful  Medications:  . amiodarone  200 mg Oral BID  . colchicine  0.6 mg Oral BID  . dicyclomine  10 mg Oral QID  . DULoxetine  30 mg Oral Daily  . insulin aspart  0-20 Units Subcutaneous TID WC  . insulin aspart  0-5 Units Subcutaneous QHS  . insulin glargine  40 Units Subcutaneous BID  . metoCLOPramide  10 mg Oral TID AC & HS  . neomycin-polymyxin b-dexamethasone  1 drop Left Eye Q6H  . pantoprazole  40 mg Oral BID  . polyvinyl alcohol  2 drop Left Eye TID  . predniSONE  40 mg Oral Q breakfast  . sodium chloride flush  10-40 mL Intracatheter Q12H  . sucralfate  1 g Oral QID  . tamsulosin  0.4 mg Oral Daily    Objective: Vital signs in last 24 hours: Temp:  [97.7 F (36.5 C)-98 F (36.7 C)] 98 F (36.7 C) (05/21 0900) Pulse Rate:  [80-109] 83 (05/21 0900) Resp:  [13-22] 17 (05/21 0900) BP: (118-134)/(82-91) 129/91 (05/21 0900) SpO2:  [91 %-95 %] 91 % (05/21 0900)  PHYSICAL EXAM:  General: Alert, cooperative,  no distress, appears stated age.  Head: Normocephalic, without obvious abnormality, atraumatic. Eyes: Conjunctivae clear, anicteric sclerae. Pupils are equal ENT Nares normal. No drainage or sinus tenderness. Lips, mucosa, and tongue normal. No Thrush Neck: Supple, symmetrical, no adenopathy, thyroid: non tender no carotid bruit and no JVD. Back: No CVA tenderness. Lungs: Clear to auscultation bilaterally. No Wheezing or Rhonchi. No rales. Heart: Regular rate and rhythm, no murmur, rub  or gallop. Abdomen: Soft, non-tender,not distended. Bowel sounds normal. No masses Extremities: atraumatic, no cyanosis. No edema. No clubbing Skin: No rashes or lesions. Or bruising Lymph: Cervical, supraclavicular normal. Neurologic: Grossly non-focal  Lab Results Recent Labs    04/11/19 0519 04/12/19 0105  WBC 11.3* 8.9  HGB 7.4* 7.8*  HCT 24.1* 25.2*  NA 132* 133*  K 3.9 3.9  CL 99 98  CO2 25 23  BUN 16 17  CREATININE 0.59* 0.68   Liver Panel No results for input(s): PROT, ALBUMIN, AST, ALT, ALKPHOS, BILITOT, BILIDIR, IBILI in the last 72 hours. Sedimentation Rate Recent Labs    04/11/19 0519  ESRSEDRATE 129*     Assessment/Plan: Impression/Recommendation ?53 yr male with complicated medical history poorly controlled diabetes, pancreatitis, polysubstance abuse, history of intrahepatic cholangiocarcinoma status post partial hepatectomyin 2018and chemotherapywith Folfox, gastroparesis,?  MRSA bacteremia- unclear source-possible he is colonized with it In Jan 2022 he had cellulitis of the abdomen at the insulin injection site and got bactrim In feb 2020 he had cellulitis and thrombophlebitis at the IV site at [facility] and given bactrim againa In April 2020 hus urine culture had MRSA ( blood culture neg) and he got bactrim again for possible prostatitis  TEE Negative Thoracic and lumbar MRI no evidence of infection Continue vanco, repeat Blood culture until clear of bacteremia  Pericardial effusion ? Viral? Bacterial? Malignant ? autoimmune check parvovirus, , HIV(neg) , COVID 19 ( negative),  As per cardiologist was not enough to do aspiration   Malignant pericardial effusion?? With h/o hepatic carcinoma and s/p hepatectomy and chemo this should be entertained but remote  Anemia - ? 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

## 2019-04-12 NOTE — TOC Initial Note (Addendum)
Transition of Care [facility]) - Initial/Assessment Note    Patient Details  Name: Kenneth Copeland MRN: [id number] Date of Birth: 12/28/1965  Transition of Care [facility]) CM/SW Contact:    Latanya Maudlin, RN Phone Number: 04/12/2019, 1:06 PM  Clinical Narrative:  [facility] team consulted to assist with disposition. Patient currently lives at an apartment with his long time girlfriend Kenneth Copeland [phone]. Patient is independent at baseline and needs no assistance with activities of daily living. Patient and girlfriend still drive. Patient recently moved here from [place] and was staying in a [place] but is now ina an apartment. Patient uses no DME. Patient has no pharmacy here in [place] but has no preference of where his medications are filled at, has used medication management in the past. PCP is Dr Bobetta Lime at [facility] in [place]. Patient likely to need home health RN for IV abx. Notified Pam from [place] [facility] as well as Corene Cornea for RN referral. [facility] team will continue to follow and coordinate schedule for education for patient prior to discharge.                  Expected Discharge Plan: [place] Barriers to Discharge: Continued Medical Work up   Patient Goals and CMS Choice Patient states their goals for this hospitalization and ongoing recovery are:: me and my girlfriend are looking for a house CMS Medicare.gov Compare Post Acute Care list provided to:: Patient Choice offered to / list presented to : Patient  Expected Discharge Plan and Services Expected Discharge Plan: [place]   Discharge Planning Services: CM Consult Post Acute Care Choice: [place] arrangements for the past 2 months: Apartment Expected Discharge Date: 04/10/19                         HH Arranged: RN, IV Antibiotics HH Agency: [place] ([place]) Date HH Agency Contacted: 04/12/19 Time [place]: 1303 Representative spoke with at [place]:  jason/pam  Prior Living Arrangements/Services Living arrangements for the past 2 months: Apartment Lives with:: Domestic Partner Patient language and need for interpreter reviewed:: No Do you feel safe going back to the place where you live?: Yes      Need for Family Participation in Patient Care: Yes (Comment)(chronic medical conditions ) Care giver support system in place?: Yes (comment)(Girlfriend)   Criminal Activity/Legal Involvement Pertinent to Current Situation/Hospitalization: No - Comment as needed  Activities of Daily Living Home Assistive Devices/Equipment: Eyeglasses, Contact lenses, CBG Meter ADL Screening (condition at time of admission) Patient's cognitive ability adequate to safely complete daily activities?: Yes Is the patient deaf or have difficulty hearing?: No Does the patient have difficulty seeing, even when wearing glasses/contacts?: Yes Does the patient have difficulty concentrating, remembering, or making decisions?: No Patient able to express need for assistance with ADLs?: Yes Does the patient have difficulty dressing or bathing?: No Independently performs ADLs?: Yes (appropriate for developmental age) Does the patient have difficulty walking or climbing stairs?: No Weakness of Legs: Both Weakness of Arms/Hands: None  Permission Sought/Granted Permission sought to share information with : Case Manager, Family Supports Permission granted to share information with : Yes, Verbal Permission Granted  Share Information with NAME: Kenneth Copeland- long time girlfriend           Emotional Assessment Appearance:: Appears older than stated age Attitude/Demeanor/Rapport: Engaged Affect (typically observed): Accepting Orientation: : Oriented to Self, Oriented to Place, Oriented to  Time, Oriented to Situation Alcohol / Substance Use: Illicit Drugs Psych Involvement: No (comment)  Admission diagnosis:  Pericardial effusion [I31.3] Chest pain, unspecified type  [R07.9] Patient Active Problem List   Diagnosis Date Noted  . Acute respiratory failure ([place])   . Chest pain 04/08/2019  . Pericardial effusion 04/02/2019   PCP:  Patient, No Pcp Per Pharmacy:   [facility] [address] [place] [address] [place] [address] Phone: [phone] Fax: [phone]     Social Determinants of Health (SDOH) Interventions    Readmission Risk Interventions Readmission Risk Prevention Plan 04/12/2019  Transportation Screening Complete  Home Care Screening Complete  Medication Review (RN CM) Complete

## 2019-04-12 NOTE — Progress Notes (Signed)
Inpatient Diabetes Program Recommendations  AACE/ADA: New Consensus Statement on Inpatient Glycemic Control   Target Ranges:  Prepandial:   less than 140 mg/dL      Peak postprandial:   less than 180 mg/dL (1-2 hours)      Critically ill patients:  140 - 180 mg/dL   Results for BORA, BRONER (MRN [id number]) as of 04/12/2019 10:13  Ref. Range 04/11/2019 07:44 04/11/2019 11:17 04/11/2019 13:41 04/11/2019 17:12 04/11/2019 21:55 04/12/2019 07:32  Glucose-Capillary Latest Ref Range: 70 - 99 mg/dL 290 (H) 283 (H) 207 (H) 155 (H) 275 (H) 240 (H)   Review of Glycemic Control  Diabetes history:DM2 Outpatient Diabetes medications:Lantus 40 units BID, Humalog 10 units TID with meals plus additional units for correction when needed Current orders for Inpatient glycemic control:Lantus 40 units BID,Novolog 0-20units, Novolog 0-5 units QHS: Prednisone 40 mg QAM   Inpatient Diabetes Program Recommendations:   Insulin-Meal Coverage: Please consider ordering Novolog 5 units TID with meals for meal coverage if patient eats at least 50% of meals.  Thanks, Barnie Alderman, RN, MSN, CDE Diabetes Coordinator Inpatient Diabetes Program [phone] (Team Pager from 8am to 5pm)

## 2019-04-12 NOTE — Progress Notes (Signed)
Pharmacy Electrolyte Monitoring Consult:  Pharmacy consulted to assist in monitoring and replacing electrolytes in this 53 y.o. male admitted on 04/08/2019 with Shortness of Breath and Chest Pain   Labs:  Sodium (mmol/L)  Date Value  04/12/2019 133 (L)   Potassium (mmol/L)  Date Value  04/12/2019 3.9   Magnesium (mg/dL)  Date Value  04/11/2019 2.2   Phosphorus (mg/dL)  Date Value  04/09/2019 2.9   Calcium (mg/dL)  Date Value  04/12/2019 7.9 (L)   Albumin (g/dL)  Date Value  04/08/2019 3.0 (L)    Assessment/Plan: No replacement warranted.   Will obtain follow up BMP with am labs.   Will replace for goal potassium ~ 4 and goal magnesium ~ 2.   Pharmacy will continue to monitor and adjust per consult.   Shannon Balthazar L 04/12/2019 4:35 PM

## 2019-04-12 NOTE — Progress Notes (Signed)
  [name], male   DOB: Feb 19, 1966, 53 y.o.   MRN: [id number]  HISTORY: Overall he feels much better.  He states that he is not short of breath.  His chest pain is improved as well.   Vitals:   04/11/19 1900 04/12/19 0010  BP: 127/90   Pulse: 87   Resp: 18   Temp:  97.7 F (36.5 C)  SpO2: 93%      EXAM:    Resp: Lungs are clear bilaterally.  No respiratory distress, normal effort. Heart:  Regular without murmurs Abd:  Abdomen is soft, non distended and non tender. No masses are palpable.  There is no rebound and no guarding.  Neurological: Alert and oriented to person, place, and time. Coordination normal.  Skin: Skin is warm and dry. No rash noted. No diaphoretic. No erythema. No pallor.  Psychiatric: Normal mood and affect. Normal behavior. Judgment and thought content normal.    ASSESSMENT: Pericardial effusion without evidence of tamponade.  I discussed his echo results with Dr. Nehemiah Massed this morning.  There is no evidence of vegetations and the 1 cm pericardial effusion appeared simple without complicating features.   PLAN:   At the present time I do not believe that pericardial window or pericardiocentesis would be indicated.  If there is anything further that I can do to assist in his care please do not hesitate to call.    Kenneth Copeland, MDPatient ID: Kenneth Copeland, male   DOB: Jun 05, 1966, 53 y.o.   MRN: [id number]

## 2019-04-12 NOTE — Progress Notes (Signed)
Report called to receiving Rn 252. Awaiting room to be cleaned then will transfer patient. Patient with no complaints at the current time.

## 2019-04-12 NOTE — Progress Notes (Signed)
[place] [facility] [place] NAME: Kenneth Copeland    MR#:  [id number]  DATE OF BIRTH:  1966-06-11  SUBJECTIVE:   He feels well other than some mild midsternal chest pain. Afebrile. Feels fatigued  REVIEW OF SYSTEMS:    Review of Systems  Constitutional: Negative for chills and fever.  HENT: Negative for congestion and tinnitus.   Eyes: Negative for blurred vision and double vision.  Respiratory: Negative for cough, shortness of breath and wheezing.   Cardiovascular: Negative for chest pain, orthopnea and PND.  Gastrointestinal: Negative for abdominal pain, diarrhea, nausea and vomiting.  Genitourinary: Negative for dysuria and hematuria.  Neurological: Negative for dizziness, sensory change and focal weakness.  All other systems reviewed and are negative.   Nutrition: Heart Healthy Tolerating Diet: Yes  DRUG ALLERGIES:   Allergies  Allergen Reactions  . Metformin And Related     Pt. States it makes his stomach bleed because of a stomach ulcer  . Motrin [Ibuprofen]     Pt. States it "inflames his stomach, causes bleeding in stomach"    VITALS:  Blood pressure (!) 129/91, pulse 83, temperature 98 F (36.7 C), temperature source Oral, resp. rate 17, height 5\' 11"  (1.803 m), weight 85.2 kg, SpO2 91 %.  PHYSICAL EXAMINATION:   Physical Exam  GENERAL:  53 y.o.-year-old patient lying in bed in NAD. EYES: Pupils equal, round, reactive to light and accommodation. No scleral icterus. Extraocular muscles intact.  HEENT: Head atraumatic, normocephalic. Oropharynx and nasopharynx clear.  NECK:  Supple, no jugular venous distention. No thyroid enlargement, no tenderness.  LUNGS: Normal breath sounds bilaterally, no wheezing, rales, rhonchi. No use of accessory muscles of respiration.  CARDIOVASCULAR: S1, S2 normal. No murmurs, rubs, or gallops.  ABDOMEN: Soft, nontender, nondistended. Bowel sounds present. No organomegaly or mass.   EXTREMITIES: No cyanosis, clubbing or edema b/l.    NEUROLOGIC: Cranial nerves II through XII are intact. No focal Motor or sensory deficits b/l.   PSYCHIATRIC: The patient is alert and oriented x 3.  SKIN: No obvious rash, lesion, or ulcer.    LABORATORY PANEL:   CBC Recent Labs  Lab 04/12/19 0105  WBC 8.9  HGB 7.8*  HCT 25.2*  PLT 409*   ------------------------------------------------------------------------------------------------------------------  Chemistries  Recent Labs  Lab 04/08/19 0652  04/11/19 0519 04/12/19 0105  NA 132*   < > 132* 133*  K 4.1   < > 3.9 3.9  CL 97*   < > 99 98  CO2 21*   < > 25 23  GLUCOSE 156*   < > 303* 378*  BUN 9   < > 16 17  CREATININE 0.53*   < > 0.59* 0.68  CALCIUM 8.3*   < > 8.1* 7.9*  MG  --    < > 2.2  --   AST 25  --   --   --   ALT 29  --   --   --   ALKPHOS 144*  --   --   --   BILITOT 1.0  --   --   --    < > = values in this interval not displayed.   ------------------------------------------------------------------------------------------------------------------  Cardiac Enzymes Recent Labs  Lab 04/08/19 1924  TROPONINI <0.03   ------------------------------------------------------------------------------------------------------------------  RADIOLOGY:  No results found.   ASSESSMENT AND PLAN:   53 year old male with past medical history of liver cancer, osteoarthritis, diabetes, chronic pain, neuropathy who was just discharged from  the hospital due to suspected sepsis and noted to have a trivial pericardial effusion now returns back due to chest pain and shortness of breath.  *  MRSA bacteremia-this was incidentally noted as patient had some blood cultures drawn 2 days ago.  Source of the bacteremia remains unclear.  Seen by infectious disease, continue IV vancomycin.  Appreciate input -Status post TEE showing no evidence of vegetation or endocarditis. - Pericardial effusion appears simple without  complicating features per cardiology and cardiothoracic surgery.  No plans on pericardiocentesis -Patient will likely need long-term IV antibiotics and eventually need a PICC line. Follow repeat BC   *  Chest pain/shortness of breath- to be secondary to musculoskeletal/with underlying pericardial effusion. -Patient's cardiac markers x3 were negative.  Seen by cardiology, echocardiogram showing moderate pericardial effusion but no tamponade physiology. -cont. Supportive care for now.    *  Pericardial effusion- thought to have worsening pericardial effusion but etiology unclear.  No tamponade physiology and seen by CT surgery and they do not recommend pericardial window/pericardiocentesis.  *A. fib.    Was on amiodarone drip but now weaned off and currently on oral amiodarone.  * COPD - mild acute exacerbation.  - due to ongoing tobacco abuse.  - cont. Prednisone, duonebs.  Improved  *  Chronic pain/neuropathy-patient has radiculopathy in the cervical spine and has chronic pain and neuropathy related to this. -Continue his gabapentin, Cymbalta  Percocet PRN  *  Diabetes type 2 without complication- cont. SSI  - BS stable.   *  History of gout-no acute attack.  Continue colchicine.   All the records are reviewed and case discussed with Care Management/Social Worker. Management plans discussed with the patient, family and they are in agreement.  CODE STATUS: Full code  DVT Prophylaxis: Lovenox  TOTAL TIME TAKING CARE OF THIS PATIENT: 30 minutes.   Leia Alf Samani Deal M.D on 04/12/2019 at 11:39 AM  Between 7am to 6pm - Pager - [phone]  After 6pm go to [email or web address] - Proofreader  [facility]  Office  [phone]  CC: Primary care physician; Patient, No Pcp Per

## 2019-04-12 NOTE — Progress Notes (Signed)
Patient transferred from ICU, alert and oriented, c/o pericardial chest pain from effusion, treated with his PRN percocet, oriented to room, call bell, and white board function. Patient resting comfortably after receiving pain med no further complaints at this time.

## 2019-04-12 NOTE — Progress Notes (Signed)
PHARMACY - PHYSICIAN COMMUNICATION CRITICAL VALUE ALERT - BLOOD CULTURE IDENTIFICATION (BCID)  Results for orders placed or performed during the hospital encounter of 04/08/19  Blood Culture ID Panel (Reflexed) (Collected: 04/12/2019  1:04 AM)  Result Value Ref Range   Enterococcus species NOT DETECTED NOT DETECTED   Listeria monocytogenes NOT DETECTED NOT DETECTED   Staphylococcus species DETECTED (A) NOT DETECTED   Staphylococcus aureus (BCID) DETECTED (A) NOT DETECTED   Methicillin resistance DETECTED (A) NOT DETECTED   Streptococcus species NOT DETECTED NOT DETECTED   Streptococcus agalactiae NOT DETECTED NOT DETECTED   Streptococcus pneumoniae NOT DETECTED NOT DETECTED   Streptococcus pyogenes NOT DETECTED NOT DETECTED   Acinetobacter baumannii NOT DETECTED NOT DETECTED   Enterobacteriaceae species NOT DETECTED NOT DETECTED   Enterobacter cloacae complex NOT DETECTED NOT DETECTED   Escherichia coli NOT DETECTED NOT DETECTED   Klebsiella oxytoca NOT DETECTED NOT DETECTED   Klebsiella pneumoniae NOT DETECTED NOT DETECTED   Proteus species NOT DETECTED NOT DETECTED   Serratia marcescens NOT DETECTED NOT DETECTED   Haemophilus influenzae NOT DETECTED NOT DETECTED   Neisseria meningitidis NOT DETECTED NOT DETECTED   Pseudomonas aeruginosa NOT DETECTED NOT DETECTED   Candida albicans NOT DETECTED NOT DETECTED   Candida glabrata NOT DETECTED NOT DETECTED   Candida krusei NOT DETECTED NOT DETECTED   Candida parapsilosis NOT DETECTED NOT DETECTED   Candida tropicalis NOT DETECTED NOT DETECTED    Name of physician (or Provider) Contacted:  Ojie   Changes to prescribed antibiotics required: No, continue pt on vanc.   Eunique Balik D 04/12/2019  9:16 PM

## 2019-04-13 ENCOUNTER — Inpatient Hospital Stay: Payer: Medicare (Managed Care)

## 2019-04-13 ENCOUNTER — Encounter: Payer: Self-pay | Admitting: Radiology

## 2019-04-13 DIAGNOSIS — D509 Iron deficiency anemia, unspecified: Secondary | ICD-10-CM

## 2019-04-13 DIAGNOSIS — D638 Anemia in other chronic diseases classified elsewhere: Secondary | ICD-10-CM

## 2019-04-13 LAB — CBC WITH DIFFERENTIAL/PLATELET
Abs Immature Granulocytes: 0.2 10*3/uL — ABNORMAL HIGH (ref 0.00–0.07)
Basophils Absolute: 0 10*3/uL (ref 0.0–0.1)
Basophils Relative: 0 %
Eosinophils Absolute: 0.1 10*3/uL (ref 0.0–0.5)
Eosinophils Relative: 1 %
HCT: 24 % — ABNORMAL LOW (ref 39.0–52.0)
Hemoglobin: 7.5 g/dL — ABNORMAL LOW (ref 13.0–17.0)
Immature Granulocytes: 2 %
Lymphocytes Relative: 19 %
Lymphs Abs: 1.7 10*3/uL (ref 0.7–4.0)
MCH: 27.3 pg (ref 26.0–34.0)
MCHC: 31.3 g/dL (ref 30.0–36.0)
MCV: 87.3 fL (ref 80.0–100.0)
Monocytes Absolute: 0.7 10*3/uL (ref 0.1–1.0)
Monocytes Relative: 7 %
Neutro Abs: 6.2 10*3/uL (ref 1.7–7.7)
Neutrophils Relative %: 71 %
Platelets: 373 10*3/uL (ref 150–400)
RBC: 2.75 MIL/uL — ABNORMAL LOW (ref 4.22–5.81)
RDW: 14.8 % (ref 11.5–15.5)
WBC: 8.8 10*3/uL (ref 4.0–10.5)
nRBC: 0 % (ref 0.0–0.2)

## 2019-04-13 LAB — QUANTIFERON-TB GOLD PLUS (RQFGPL)
QuantiFERON Mitogen Value: 0.41 IU/mL
QuantiFERON Nil Value: 0.04 IU/mL
QuantiFERON TB1 Ag Value: 0.05 IU/mL
QuantiFERON TB2 Ag Value: 0.04 IU/mL

## 2019-04-13 LAB — GLUCOSE, CAPILLARY
Glucose-Capillary: 247 mg/dL — ABNORMAL HIGH (ref 70–99)
Glucose-Capillary: 215 mg/dL — ABNORMAL HIGH (ref 70–99)
Glucose-Capillary: 139 mg/dL — ABNORMAL HIGH (ref 70–99)
Glucose-Capillary: 92 mg/dL (ref 70–99)
Glucose-Capillary: 190 mg/dL — ABNORMAL HIGH (ref 70–99)
Glucose-Capillary: 311 mg/dL — ABNORMAL HIGH (ref 70–99)

## 2019-04-13 LAB — BASIC METABOLIC PANEL
Anion gap: 8 (ref 5–15)
BUN: 10 mg/dL (ref 6–20)
CO2: 27 mmol/L (ref 22–32)
Calcium: 7.6 mg/dL — ABNORMAL LOW (ref 8.9–10.3)
Chloride: 96 mmol/L — ABNORMAL LOW (ref 98–111)
Creatinine, Ser: 0.55 mg/dL — ABNORMAL LOW (ref 0.61–1.24)
GFR calc Af Amer: >60 mL/min (ref >60)
GFR calc non Af Amer: >60 mL/min (ref >60)
Glucose, Bld: 180 mg/dL — ABNORMAL HIGH (ref 70–99)
Potassium: 3.2 mmol/L — ABNORMAL LOW (ref 3.5–5.1)
Sodium: 131 mmol/L — ABNORMAL LOW (ref 135–145)

## 2019-04-13 LAB — BLOOD CULTURE ID PANEL (REFLEXED)

## 2019-04-13 LAB — QUANTIFERON-TB GOLD PLUS: QuantiFERON-TB Gold Plus: UNDETERMINED — AB

## 2019-04-13 LAB — VANCOMYCIN, PEAK: Vancomycin Pk: 20 ug/mL — ABNORMAL LOW (ref 30–40)

## 2019-04-13 LAB — PARVOVIRUS B19 ANTIBODY, IGG AND IGM
Parovirus B19 IgG Abs: 0.1 index (ref 0.0–0.8)
Parovirus B19 IgM Abs: 0.1 index (ref 0.0–0.8)

## 2019-04-13 MED ORDER — SODIUM CHLORIDE 0.9 % IV SOLN
750.0000 mg | Freq: Every day | INTRAVENOUS | Status: DC
  Administered 2019-04-13 – 2019-04-17 (×5): 750 mg via INTRAVENOUS
  Filled 2019-04-13 (×7): qty 15

## 2019-04-13 MED ORDER — POTASSIUM CHLORIDE CRYS ER 20 MEQ PO TBCR
40.0000 meq | EXTENDED_RELEASE_TABLET | Freq: Once | ORAL | Status: AC
  Administered 2019-04-13: 40 meq via ORAL
  Filled 2019-04-13: qty 2

## 2019-04-13 MED ORDER — IOHEXOL 300 MG/ML  SOLN
100.0000 mL | Freq: Once | INTRAMUSCULAR | Status: AC | PRN
  Administered 2019-04-13: 100 mL via INTRAVENOUS

## 2019-04-13 MED ORDER — IOHEXOL 240 MG/ML SOLN
25.0000 mL | INTRAMUSCULAR | Status: AC
  Administered 2019-04-13 (×2): 25 mL via ORAL

## 2019-04-13 MED ORDER — SODIUM CHLORIDE 0.9 % IV SOLN
INTRAVENOUS | Status: DC | PRN
  Administered 2019-04-13 – 2019-04-15 (×4): 10 mL via INTRAVENOUS
  Administered 2019-04-21 – 2019-04-24 (×3): 250 mL via INTRAVENOUS
  Administered 2019-04-25: 1000 mL via INTRAVENOUS

## 2019-04-13 NOTE — Progress Notes (Signed)
[facility] NAME: Kenneth Copeland    MR#:  [id number]  DATE OF BIRTH:  14-Jun-1966  SUBJECTIVE:   Still has chest pain Afebrile. Feels fatigued.  Repeat blood cx positive for MRSA again  REVIEW OF SYSTEMS:    Review of Systems  Constitutional: Negative for chills and fever.  HENT: Negative for congestion and tinnitus.   Eyes: Negative for blurred vision and double vision.  Respiratory: Negative for cough, shortness of breath and wheezing.   Cardiovascular: Negative for chest pain, orthopnea and PND.  Gastrointestinal: Negative for abdominal pain, diarrhea, nausea and vomiting.  Genitourinary: Negative for dysuria and hematuria.  Neurological: Negative for dizziness, sensory change and focal weakness.  All other systems reviewed and are negative.   Nutrition: Heart Healthy Tolerating Diet: Yes  DRUG ALLERGIES:   Allergies  Allergen Reactions  . Metformin And Related     Pt. States it makes his stomach bleed because of a stomach ulcer  . Motrin [Ibuprofen]     Pt. States it "inflames his stomach, causes bleeding in stomach"    VITALS:  Blood pressure 127/87, pulse 73, temperature 97.9 F (36.6 C), temperature source Oral, resp. rate (!) 21, height 5\' 11"  (1.803 m), weight 89.5 kg, SpO2 94 %.  PHYSICAL EXAMINATION:   Physical Exam  GENERAL:  53 y.o.-year-old patient lying in bed in NAD. EYES: Pupils equal, round, reactive to light and accommodation. No scleral icterus. Extraocular muscles intact.  HEENT: Head atraumatic, normocephalic. Oropharynx and nasopharynx clear.  NECK:  Supple, no jugular venous distention. No thyroid enlargement, no tenderness.  LUNGS: Normal breath sounds bilaterally, no wheezing, rales, rhonchi. No use of accessory muscles of respiration.  CARDIOVASCULAR: S1, S2 normal. No murmurs, rubs, or gallops.  ABDOMEN: Soft, nontender, nondistended. Bowel sounds present. No organomegaly or mass.   EXTREMITIES: No cyanosis, clubbing or edema b/l.    NEUROLOGIC: Cranial nerves II through XII are intact. No focal Motor or sensory deficits b/l.   PSYCHIATRIC: The patient is alert and oriented x 3.  SKIN: No obvious rash, lesion, or ulcer.    LABORATORY PANEL:   CBC Recent Labs  Lab 04/13/19 0602  WBC 8.8  HGB 7.5*  HCT 24.0*  PLT 373   ------------------------------------------------------------------------------------------------------------------  Chemistries  Recent Labs  Lab 04/08/19 0652  04/11/19 0519  04/13/19 0602  NA 132*   < > 132*   < > 131*  K 4.1   < > 3.9   < > 3.2*  CL 97*   < > 99   < > 96*  CO2 21*   < > 25   < > 27  GLUCOSE 156*   < > 303*   < > 180*  BUN 9   < > 16   < > 10  CREATININE 0.53*   < > 0.59*   < > 0.55*  CALCIUM 8.3*   < > 8.1*   < > 7.6*  MG  --    < > 2.2  --   --   AST 25  --   --   --   --   ALT 29  --   --   --   --   ALKPHOS 144*  --   --   --   --   BILITOT 1.0  --   --   --   --    < > = values in this interval not displayed.   ------------------------------------------------------------------------------------------------------------------  Cardiac Enzymes Recent Labs  Lab 04/08/19 1924  TROPONINI <0.03   ------------------------------------------------------------------------------------------------------------------  RADIOLOGY:  Mr Thoracic Spine W Wo Contrast  Result Date: 04/12/2019 CLINICAL DATA:  Bacteremia. EXAM: MRI THORACIC AND LUMBAR SPINE WITHOUT AND WITH CONTRAST TECHNIQUE: Multiplanar and multiecho pulse sequences of the thoracic and lumbar spine were obtained without and with intravenous contrast. CONTRAST:  8 mL Gadavist COMPARISON:  Chest CTA 04/02/2019 FINDINGS: MRI THORACIC SPINE FINDINGS Alignment:  Normal. Vertebrae: No fracture, suspicious osseous lesion, or significant marrow edema. No evidence of discitis. T4 superior endplate Schmorl's node. Cord: Normal signal and morphology. No abnormal  intradural enhancement. No epidural fluid collection. Paraspinal and other soft tissues: Bilateral pleural effusions and partially visualized pericardial effusion as seen on prior CT. Disc levels: Mild multilevel disc bulging primarily in the midthoracic spine. Small left paracentral disc protrusion at T8-9. No spinal stenosis or cord compression. Multilevel facet arthrosis with spurring most notable on the right at T9-10 and T10-11. No evidence of compressive neural foraminal stenosis. MRI LUMBAR SPINE FINDINGS Mild motion artifact. Segmentation: Standard. Alignment:  At most trace retrolisthesis of L2 on L3 and L3 on L4. Vertebrae: No lumbar spine fracture, suspicious osseous lesion, or evidence of discitis. Small a Mangione in the L4 vertebral body. Partial imaging of a small curvilinear low signal intensity focus in the left sacral ala without associated edema to indicate a recent fracture. Conus medullaris: Extends to the T12 level and appears normal. Paraspinal and other soft tissues: No paraspinal fluid collection or evidence of other acute abnormality. Disc levels: T12-L1 and L1-2: Negative. L2-3: Disc desiccation and mild disc space narrowing. Mild disc bulging without stenosis. L3-4: Disc desiccation.  Mild disc bulging without stenosis. L4-5: Disc desiccation. Mild disc bulging and mild facet hypertrophy result in minimal right and mild left neural foraminal stenosis without spinal stenosis. L5-S1: Minimal disc bulging and mild facet hypertrophy result in mild left neural foraminal stenosis without spinal stenosis. IMPRESSION: 1. No evidence of infection in the thoracic and lumbar spine. 2. Mild thoracic spondylosis without stenosis. 3. Mild lumbar spondylosis with mild neural foraminal stenosis at L4-5 and L5-S1. No spinal stenosis. Electronically Signed   By: Logan Bores M.D.   On: 04/12/2019 12:40   Mr Lumbar Spine W Wo Contrast  Result Date: 04/12/2019 CLINICAL DATA:  Bacteremia. EXAM: MRI  THORACIC AND LUMBAR SPINE WITHOUT AND WITH CONTRAST TECHNIQUE: Multiplanar and multiecho pulse sequences of the thoracic and lumbar spine were obtained without and with intravenous contrast. CONTRAST:  8 mL Gadavist COMPARISON:  Chest CTA 04/02/2019 FINDINGS: MRI THORACIC SPINE FINDINGS Alignment:  Normal. Vertebrae: No fracture, suspicious osseous lesion, or significant marrow edema. No evidence of discitis. T4 superior endplate Schmorl's node. Cord: Normal signal and morphology. No abnormal intradural enhancement. No epidural fluid collection. Paraspinal and other soft tissues: Bilateral pleural effusions and partially visualized pericardial effusion as seen on prior CT. Disc levels: Mild multilevel disc bulging primarily in the midthoracic spine. Small left paracentral disc protrusion at T8-9. No spinal stenosis or cord compression. Multilevel facet arthrosis with spurring most notable on the right at T9-10 and T10-11. No evidence of compressive neural foraminal stenosis. MRI LUMBAR SPINE FINDINGS Mild motion artifact. Segmentation: Standard. Alignment:  At most trace retrolisthesis of L2 on L3 and L3 on L4. Vertebrae: No lumbar spine fracture, suspicious osseous lesion, or evidence of discitis. Small a Mangione in the L4 vertebral body. Partial imaging of a small curvilinear low signal intensity focus in the left sacral ala without  associated edema to indicate a recent fracture. Conus medullaris: Extends to the T12 level and appears normal. Paraspinal and other soft tissues: No paraspinal fluid collection or evidence of other acute abnormality. Disc levels: T12-L1 and L1-2: Negative. L2-3: Disc desiccation and mild disc space narrowing. Mild disc bulging without stenosis. L3-4: Disc desiccation.  Mild disc bulging without stenosis. L4-5: Disc desiccation. Mild disc bulging and mild facet hypertrophy result in minimal right and mild left neural foraminal stenosis without spinal stenosis. L5-S1: Minimal disc  bulging and mild facet hypertrophy result in mild left neural foraminal stenosis without spinal stenosis. IMPRESSION: 1. No evidence of infection in the thoracic and lumbar spine. 2. Mild thoracic spondylosis without stenosis. 3. Mild lumbar spondylosis with mild neural foraminal stenosis at L4-5 and L5-S1. No spinal stenosis. Electronically Signed   By: Logan Bores M.D.   On: 04/12/2019 12:40     ASSESSMENT AND PLAN:   53 year old male with past medical history of liver cancer, osteoarthritis, diabetes, chronic pain, neuropathy who was just discharged from the hospital due to suspected sepsis and noted to have a trivial pericardial effusion now returns back due to chest pain and shortness of breath.  *  MRSA bacteremia-this was incidentally noted as patient had some blood cultures drawn 2 days ago.  Source of the bacteremia remains unclear.  Seen by infectious disease, continue IV vancomycin.  Appreciate input -Status post TEE showing no evidence of vegetation or endocarditis. - Pericardial effusion appears simple without complicating features per cardiology and cardiothoracic surgery.  No plans on pericardiocentesis -Blood cultures positive for MRSA again.  Will discuss with infectious disease. -Reviewed blood cultures today -Patient will likely need long-term IV antibiotics and eventually need a PICC line.  *  Chest pain/shortness of breath- to be secondary to musculoskeletal/with underlying pericardial effusion. -Patient's cardiac markers x3 were negative.  Seen by cardiology, echocardiogram showing moderate pericardial effusion but no tamponade physiology. -cont. Supportive care for now.    *  Pericardial effusion- thought to have worsening pericardial effusion but etiology unclear.  No tamponade physiology and seen by CT surgery and they do not recommend pericardial window/pericardiocentesis.  *A. fib.    Was on amiodarone drip but now weaned off and currently on oral amiodarone.  *  COPD - mild acute exacerbation.  - due to ongoing tobacco abuse.  - cont. Prednisone, duonebs.  Improved  *  Chronic pain/neuropathy-patient has radiculopathy in the cervical spine and has chronic pain and neuropathy related to this. -Continue his gabapentin, Cymbalta  Percocet PRN  *  Diabetes type 2 without complication- cont. SSI  - BS stable.   *  History of gout-no acute attack.  Continue colchicine.  All the records are reviewed and case discussed with Care Management/Social Worker. Management plans discussed with the patient, family and they are in agreement.  CODE STATUS: Full code  DVT Prophylaxis: Lovenox  TOTAL TIME TAKING CARE OF THIS PATIENT: 30 minutes.   Leia Alf Tine Mabee M.D on 04/13/2019 at 12:20 PM  Between 7am to 6pm - Pager - [phone]  After 6pm go to [email or web address] - Proofreader  [facility]  Office  [phone]  CC: Primary care physician; Patient, No Pcp Per

## 2019-04-13 NOTE — TOC Progression Note (Signed)
Transition of Care [facility]) - Progression Note    Patient Details  Name: Kenneth Copeland MRN: [id number] Date of Birth: 1966/06/15  Transition of Care [facility]) CM/SW Contact  Ross Ludwig, [place] Phone Number: 04/13/2019, 5:29 PM  Clinical Narrative:     CSW spoke with Pam with the IV infusion company, patient's insurance is not accepted, and he would have to private pay.  CSW is awaiting prices of medications to see if the charitable foundation will be able to assist patient.  CSW continuing to follow patient's progress throughout discharge planning.   Expected Discharge Plan: [place] Barriers to Discharge: Continued Medical Work up  Expected Discharge Plan and Services Expected Discharge Plan: [place]   Discharge Planning Services: CM Consult Post Acute Care Choice: [place] arrangements for the past 2 months: Apartment Expected Discharge Date: 04/10/19                         HH Arranged: RN, IV Antibiotics HH Agency: [place] ([place]) Date HH Agency Contacted: 04/12/19 Time [place]: 1303 Representative spoke with at [place]: [email or web address]   Social Determinants of Health ([place]) Interventions    Readmission Risk Interventions Readmission Risk Prevention Plan 04/12/2019  Transportation Screening Complete  Home Care Screening Complete  Medication Review (RN CM) Complete

## 2019-04-13 NOTE — Progress Notes (Signed)
Pharmacy Electrolyte Monitoring Consult:  Pharmacy consulted to assist in monitoring and replacing electrolytes in this 53 y.o. male admitted on 04/08/2019 with Shortness of Breath and Chest Pain   Labs:  Sodium (mmol/L)  Date Value  04/13/2019 131 (L)   Potassium (mmol/L)  Date Value  04/13/2019 3.2 (L)   Magnesium (mg/dL)  Date Value  04/11/2019 2.2   Phosphorus (mg/dL)  Date Value  04/09/2019 2.9   Calcium (mg/dL)  Date Value  04/13/2019 7.6 (L)   Albumin (g/dL)  Date Value  04/08/2019 3.0 (L)    Assessment/Plan: 5/22  K 3.2  Scr 0.55  MD has ordered KCL 40 meq PO x1 Will obtain follow up BMP with am labs.   Will replace for goal potassium ~ 4 and goal magnesium ~ 2.  Pharmacy will continue to monitor and adjust per consult.   Ginnette Gates A 04/13/2019 2:32 PM

## 2019-04-13 NOTE — Progress Notes (Addendum)
Pharmacy Antibiotic Note  Kenneth Copeland is a 53 y.o. male admitted on 04/08/2019 with chest pain and shortness of breath. Pharmacy has been consulted for vancomycin dosing.  Blood cultures with persistent MRSA bacteremia. TEE negative for endocarditis. ID is following the patient.  Plan: Patient received 2000 mg IV x 1 followed by a dose of 1750 mg q12h (received one dose). Patient was on vanc during recent hospitalization at [facility]. From review of records, it appears 1250 mg q12h may be too low of a dose based on levels performed at their hospital, but question if 1750 may be on the higher end.  Patient has been on vanc 1500 mg IV q12h with stable renal function. Expected AUC 479.2 using a SCr 0.8. Of note, patient missed a dose of vanc 5/20 pm.   Will order a vanc peak today at 1700 and a trough tomorrow at 0100. Peak following fourth dose of maintenance regimen and trough prior to fifth dose.  Per ID, will add daptomycin to start today and will continue vancomycin. Daptomycin 8 mg/kg (rounded to 750 mg) ordered for today at 1200. CK ordered with am labs.   Height: 5\' 11"  (180.3 cm) Weight: 197 lb 4.8 oz (89.5 kg) IBW/kg (Calculated) : 75.3  Temp (24hrs), Avg:98.1 F (36.7 C), Min:97.8 F (36.6 C), Max:98.3 F (36.8 C)  Recent Labs  Lab 04/08/19 0652 04/09/19 0520 04/10/19 0852 04/11/19 0519 04/12/19 0105 04/13/19 0602  WBC 10.5 12.7*  --  11.3* 8.9 8.8  CREATININE 0.53* 0.58* 0.75 0.59* 0.68 0.55*    Estimated Creatinine Clearance: 117.7 mL/min (A) (by C-G formula based on SCr of 0.55 mg/dL (L)).    Allergies  Allergen Reactions  . Metformin And Related     Pt. States it makes his stomach bleed because of a stomach ulcer  . Motrin [Ibuprofen]     Pt. States it "inflames his stomach, causes bleeding in stomach"    Antimicrobials this admission: Vancomycin 5/19 >> Daptomycin 5/22 >>  Dose adjustments this admission: 5/20 Vanc 1750 mg q12h >> 1500 mg  q12h  Microbiology results: 5/18 BCx: 2/4 bottles MRSA 5/21 BCx: 2/4 bottles MRSA 5/22 BCx: sent 5/19 UCx: no growth  5/18 MRSA PCR: negative 5/17 SARS-CoV-2: negative  Thank you for allowing pharmacy to be a part of this patient's care.  Tawnya Crook, PharmD Pharmacy Resident  04/13/2019 10:08 AM

## 2019-04-13 NOTE — Plan of Care (Signed)
  Problem: Education: Goal: Knowledge of General Education information will improve Description: Including pain rating scale, medication(s)/side effects and non-pharmacologic comfort measures Outcome: Progressing   Problem: Activity: Goal: Ability to tolerate increased activity will improve Outcome: Progressing   

## 2019-04-13 NOTE — Progress Notes (Addendum)
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 Pt was recently in [facility] between 5/10-5/14 for same complaint and was diagnosed with pericardial effusion.  Patient has a complicated medical history.HE has a h/o poorly controlled DM, cocaine use. He moved from [place] to [place] in April. He initially presented to [facility] ED on 4/2 with b/l flank pain and hematuria and was discharged from ED on cipro for 5 daysafter the CT abdomen did not show any renal stones.He went back andwas admitted [facility] between 4/21/2020to 03/19/2019 , withdiarrhea vomiting stomach pain and diffuse myalgia of 4 days duration. On presentation the ED he was afebrile with no leukocytosis however had an elevated lactate of 5.7 and persistent tachycardia. He was initially treated empirically with ceftriaxone and Flagyl. COVID testing was negative. But once he was transferred to the medical floor he had a temperature of 102.2 rectal. And his antibiotic was broadened to Vanco cefepime and Flagyl. During that hospitalization his urine culture was positive for MRSA but blood cultures were negative both on 03/14/2019 and 03/15/2019. C. difficile was negative, urine Legionella was negative, CK was high ( max 1223) and ESR was 86 and CRP 12 , CXR was normal with No focal consolidation, pneumothorax or pleural effusion. Cardiomediastinal silhouette and pulmonary vasculaturewerewithin normal limits. .CT abdomen revealed proctitis and also large periportal, perigastric, perisplenic varices secondary to chronic splenic vein occlusion.Enlarged prostate with scattered prostatic calcificationswas noted  andmild circumferential wall edema and mucosal enhancement of the rectum without significant mesorectal inflammation, suggestive of mild proctitis.digitalrectal examination was concerning for prostatitis. He was discharged onBactrim for 28-day course to complete until 04/14/2019  Pt continues to use cocaine -says he stuffs it in his mouth at the site of dental cavitis which is painful. HE denies IV DA Subjective: No fever Some chest pain   Medications:  . amiodarone  200 mg Oral BID  . colchicine  0.6 mg Oral BID  . dicyclomine  10 mg Oral QID  . DULoxetine  30 mg Oral Daily  . enoxaparin (LOVENOX) injection  40 mg Subcutaneous Daily  . insulin aspart  0-20 Units Subcutaneous TID WC  . insulin aspart  0-5 Units Subcutaneous QHS  . insulin aspart  5 Units Subcutaneous TID WC  . insulin glargine  40 Units Subcutaneous BID  . metoCLOPramide  10 mg Oral TID AC & HS  . neomycin-polymyxin b-dexamethasone  1 drop Left Eye Q6H  . pantoprazole  40 mg Oral BID  . polyvinyl alcohol  2 drop Left Eye TID  . predniSONE  40 mg Oral Q breakfast  . sodium chloride flush  10-40 mL Intracatheter Q12H  . sucralfate  1 g Oral QID  . tamsulosin  0.4 mg Oral Daily    Objective: Vital signs in last 24 hours: Temp:  [97.8 F (36.6 C)-98.3 F (36.8 C)] 97.9 F (36.6 C) (05/22 0742) Pulse Rate:  [73-89] 73 (05/22 0742) Resp:  [12-26] 21 (05/21 1700) BP: (118-148)/(78-95) 127/87 (05/22 0742) SpO2:  [91 %-94 %] 94 % (05/22 0742) Weight:  [89.5 kg] 89.5 kg (05/21 1810)  PHYSICAL EXAM:  General: Alert, cooperative, no distress, appears stated age.  Head: Normocephalic, without obvious abnormality, atraumatic. Eyes: Conjunctivae clear, anicteric sclerae. Pupils are equal ENT Nares  normal. No drainage or sinus tenderness. Lips, mucosa, and tongue normal. No Thrush Neck: Supple, symmetrical, no adenopathy, thyroid: non tender no carotid bruit and no JVD. Back: No CVA tenderness. Lungs: b/la ir  entry Heart: s1s2 Abdomen: Soft, non-tender,not distended. Bowel sounds normal. No masses Extremities: atraumatic, no cyanosis. No edema. No clubbing Skin: abd wall insulin injection site no erythema Lymph: Cervical, supraclavicular normal. Neurologic: Grossly non-focal  Lab Results Recent Labs    04/12/19 0105 04/13/19 0602  WBC 8.9 8.8  HGB 7.8* 7.5*  HCT 25.2* 24.0*  NA 133* 131*  K 3.9 3.2*  CL 98 96*  CO2 23 27  BUN 17 10  CREATININE 0.68 0.55*   Liver Panel No results for input(s): PROT, ALBUMIN, AST, ALT, ALKPHOS, BILITOT, BILIDIR, IBILI in the last 72 hours. Sedimentation Rate Recent Labs    04/11/19 0519  ESRSEDRATE 129*   C-Reactive Protein No results for input(s): CRP in the last 72 hours.  Microbiology:  Studies/Results: Mr Thoracic Spine W Wo Contrast  Result Date: 04/12/2019 CLINICAL DATA:  Bacteremia. EXAM: MRI THORACIC AND LUMBAR SPINE WITHOUT AND WITH CONTRAST TECHNIQUE: Multiplanar and multiecho pulse sequences of the thoracic and lumbar spine were obtained without and with intravenous contrast. CONTRAST:  8 mL Gadavist COMPARISON:  Chest CTA 04/02/2019 FINDINGS: MRI THORACIC SPINE FINDINGS Alignment:  Normal. Vertebrae: No fracture, suspicious osseous lesion, or significant marrow edema. No evidence of discitis. T4 superior endplate Schmorl's node. Cord: Normal signal and morphology. No abnormal intradural enhancement. No epidural fluid collection. Paraspinal and other soft tissues: Bilateral pleural effusions and partially visualized pericardial effusion as seen on prior CT. Disc levels: Mild multilevel disc bulging primarily in the midthoracic spine. Small left paracentral disc protrusion at T8-9. No spinal stenosis or cord compression. Multilevel facet arthrosis with spurring most notable on the right at T9-10 and T10-11. No evidence of compressive neural foraminal stenosis. MRI LUMBAR SPINE FINDINGS Mild motion artifact. Segmentation: Standard. Alignment:   At most trace retrolisthesis of L2 on L3 and L3 on L4. Vertebrae: No lumbar spine fracture, suspicious osseous lesion, or evidence of discitis. Small a Mangione in the L4 vertebral body. Partial imaging of a small curvilinear low signal intensity focus in the left sacral ala without associated edema to indicate a recent fracture. Conus medullaris: Extends to the T12 level and appears normal. Paraspinal and other soft tissues: No paraspinal fluid collection or evidence of other acute abnormality. Disc levels: T12-L1 and L1-2: Negative. L2-3: Disc desiccation and mild disc space narrowing. Mild disc bulging without stenosis. L3-4: Disc desiccation.  Mild disc bulging without stenosis. L4-5: Disc desiccation. Mild disc bulging and mild facet hypertrophy result in minimal right and mild left neural foraminal stenosis without spinal stenosis. L5-S1: Minimal disc bulging and mild facet hypertrophy result in mild left neural foraminal stenosis without spinal stenosis. IMPRESSION: 1. No evidence of infection in the thoracic and lumbar spine. 2. Mild thoracic spondylosis without stenosis. 3. Mild lumbar spondylosis with mild neural foraminal stenosis at L4-5 and L5-S1. No spinal stenosis. Electronically Signed   By: Logan Bores M.D.   On: 04/12/2019 12:40   Mr Lumbar Spine W Wo Contrast  Result Date: 04/12/2019 CLINICAL DATA:  Bacteremia. EXAM: MRI THORACIC AND LUMBAR SPINE WITHOUT AND WITH CONTRAST TECHNIQUE: Multiplanar and multiecho pulse sequences of the thoracic and lumbar spine were obtained without and with intravenous contrast. CONTRAST:  8 mL Gadavist COMPARISON:  Chest CTA 04/02/2019 FINDINGS: MRI THORACIC SPINE FINDINGS Alignment:  Normal. Vertebrae: No fracture,  suspicious osseous lesion, or significant marrow edema. No evidence of discitis. T4 superior endplate Schmorl's node. Cord: Normal signal and morphology. No abnormal intradural enhancement. No epidural fluid collection. Paraspinal and other soft  tissues: Bilateral pleural effusions and partially visualized pericardial effusion as seen on prior CT. Disc levels: Mild multilevel disc bulging primarily in the midthoracic spine. Small left paracentral disc protrusion at T8-9. No spinal stenosis or cord compression. Multilevel facet arthrosis with spurring most notable on the right at T9-10 and T10-11. No evidence of compressive neural foraminal stenosis. MRI LUMBAR SPINE FINDINGS Mild motion artifact. Segmentation: Standard. Alignment:  At most trace retrolisthesis of L2 on L3 and L3 on L4. Vertebrae: No lumbar spine fracture, suspicious osseous lesion, or evidence of discitis. Small a Mangione in the L4 vertebral body. Partial imaging of a small curvilinear low signal intensity focus in the left sacral ala without associated edema to indicate a recent fracture. Conus medullaris: Extends to the T12 level and appears normal. Paraspinal and other soft tissues: No paraspinal fluid collection or evidence of other acute abnormality. Disc levels: T12-L1 and L1-2: Negative. L2-3: Disc desiccation and mild disc space narrowing. Mild disc bulging without stenosis. L3-4: Disc desiccation.  Mild disc bulging without stenosis. L4-5: Disc desiccation. Mild disc bulging and mild facet hypertrophy result in minimal right and mild left neural foraminal stenosis without spinal stenosis. L5-S1: Minimal disc bulging and mild facet hypertrophy result in mild left neural foraminal stenosis without spinal stenosis. IMPRESSION: 1. No evidence of infection in the thoracic and lumbar spine. 2. Mild thoracic spondylosis without stenosis. 3. Mild lumbar spondylosis with mild neural foraminal stenosis at L4-5 and L5-S1. No spinal stenosis. Electronically Signed   By: Logan Bores M.D.   On: 04/12/2019 12:40     Assessment/Plan:   53 yr male with complicated medical history poorly controlled diabetes, pancreatitis, polysubstance abuse, history of intrahepatic cholangiocarcinoma  status post partial hepatectomyin 2018and chemotherapywith Folfox, gastroparesis,?  MRSA bacteremia- unclear source- TEE Neg, entire spine imaged and N Repeat blood culture positive again- On Vanco ( MIC 1) will add daptomycin until vanco becomes therapeutic and also till the repeat culture MIC finalizes Is the pericardial effusion infected?/ Will get CT abd /pelvis to look for any collection ( has mesh)  In Jan 2020 he had cellulitis of the abdomen at the insulin injection site and got bactrim In feb 2020 he had cellulitis and thrombophlebitis at the IV site at [facility] and given bactrim againa In April 2020 hus urine culture had MRSA ( blood culture neg) and he got bactrim again for possible prostatitis  Pericardial effusion ? Bacterial? Viral ? Malignant ? autoimmune checkparvovirus, , HIV(neg) , COVID 19 ( negative), As per cardiologist was not enough to do aspiration  Malignant pericardial effusion?? With h/o hepatic carcinoma and s/p hepatectomy and chemo this should be entertainedbut remote  Anemia - ? 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 Peripheral enhancement suggests complexity and may be related to hemorrhage, inflammation, or infection. 2. Low density identified in the tail of pancreas extending into  the splenic hilum and anteriorly towards the posterior wall the stomach. Edema from pancreatitis is a concern and lack of enhancing pancreatic parenchyma raises the question of pancreatic necrosis. This  amorphous low-density in the pancreatic tail measures higher than water attenuation and hypoenhancing adenocarcinoma is also consideration. Endoscopic ultrasound may prove helpful to further evaluate. 3. No evidence for hepatic, renal, or prostatic abscess. 4. Bilateral lower lobe collapse/consolidation with small bilateral pleural effusions. 5.  Aortic Atherosclerois (ICD10-170.0)  Pt will need diagnostic/therapeutic pericardial apsiration Informed Dr.Sudini/Dr.Oaks Window cannot be done at [facility] have to be transferred to [facility]

## 2019-04-14 ENCOUNTER — Encounter: Payer: Self-pay | Admitting: Internal Medicine

## 2019-04-14 LAB — GLUCOSE, CAPILLARY
Glucose-Capillary: 114 mg/dL — ABNORMAL HIGH (ref 70–99)
Glucose-Capillary: 182 mg/dL — ABNORMAL HIGH (ref 70–99)
Glucose-Capillary: 191 mg/dL — ABNORMAL HIGH (ref 70–99)
Glucose-Capillary: 208 mg/dL — ABNORMAL HIGH (ref 70–99)
Glucose-Capillary: 211 mg/dL — ABNORMAL HIGH (ref 70–99)
Glucose-Capillary: 319 mg/dL — ABNORMAL HIGH (ref 70–99)

## 2019-04-14 LAB — BASIC METABOLIC PANEL
Anion gap: 9 (ref 5–15)
BUN: 11 mg/dL (ref 6–20)
CO2: 29 mmol/L (ref 22–32)
Calcium: 7.9 mg/dL — ABNORMAL LOW (ref 8.9–10.3)
Chloride: 98 mmol/L (ref 98–111)
Creatinine, Ser: 0.58 mg/dL — ABNORMAL LOW (ref 0.61–1.24)
GFR calc Af Amer: >60 mL/min (ref >60)
GFR calc non Af Amer: >60 mL/min (ref >60)
Glucose, Bld: 265 mg/dL — ABNORMAL HIGH (ref 70–99)
Potassium: 3.5 mmol/L (ref 3.5–5.1)
Sodium: 136 mmol/L (ref 135–145)

## 2019-04-14 LAB — CK: Total CK: 13 U/L — ABNORMAL LOW (ref 49–397)

## 2019-04-14 LAB — VANCOMYCIN, TROUGH: Vancomycin Tr: 9 ug/mL — ABNORMAL LOW (ref 15–20)

## 2019-04-14 MED ORDER — VANCOMYCIN HCL 10 G IV SOLR
1250.0000 mg | Freq: Three times a day (TID) | INTRAVENOUS | Status: DC
  Administered 2019-04-14 – 2019-04-17 (×10): 1250 mg via INTRAVENOUS
  Filled 2019-04-14 (×13): qty 1250

## 2019-04-14 MED ORDER — OXYCODONE HCL ER 10 MG PO T12A
10.0000 mg | EXTENDED_RELEASE_TABLET | Freq: Two times a day (BID) | ORAL | Status: DC
  Administered 2019-04-14 – 2019-04-17 (×7): 10 mg via ORAL
  Filled 2019-04-14 (×8): qty 1

## 2019-04-14 MED ORDER — POTASSIUM CHLORIDE CRYS ER 20 MEQ PO TBCR
40.0000 meq | EXTENDED_RELEASE_TABLET | Freq: Once | ORAL | Status: AC
  Administered 2019-04-14: 40 meq via ORAL
  Filled 2019-04-14: qty 2

## 2019-04-14 NOTE — Progress Notes (Signed)
Pharmacy Antibiotic Note  Kenneth Copeland is a 53 y.o. male admitted on 04/08/2019 with chest pain and shortness of breath. Pharmacy has been consulted for vancomycin dosing.  Blood cultures with persistent MRSA bacteremia. TEE negative for endocarditis. ID is following the patient.  Plan: 05/23 @ 0053 prior regimen of vanc 1.5g IV q12h resulted in a steady-state AUC of 343.2 mcg*h/mL and Peak: 20 mcg/mL, Trough: 9 mcg/mL. Dose adjusted to:  Vancomycin 1250 mg IV Q 8 hrs. Goal AUC 400-550. Expected AUC: 428.1 SCr used: 0.8 mg/dL on calculator (patient's actual Scr 0.55 - 0.58 mg/dL) Cssmin: 11.8 mcg/mL  Will order vanc peak 05/24 @ 1230 and trough 05/24 @ 1730. Will continue monitoring renal fx and s/sx of infx.    Height: 5\' 11"  (180.3 cm) Weight: 197 lb 4.8 oz (89.5 kg) IBW/kg (Calculated) : 75.3  Temp (24hrs), Avg:98.1 F (36.7 C), Min:97.9 F (36.6 C), Max:98.3 F (36.8 C)  Recent Labs  Lab 04/08/19 0652 04/09/19 0520 04/10/19 0852 04/11/19 0519 04/12/19 0105 04/13/19 0602 04/13/19 1727 04/14/19 0020 04/14/19 0232  WBC 10.5 12.7*  --  11.3* 8.9 8.8  --   --   --   CREATININE 0.53* 0.58* 0.75 0.59* 0.68 0.55*  --   --  0.58*  VANCOTROUGH  --   --   --   --   --   --   --  9*  --   VANCOPEAK  --   --   --   --   --   --  20*  --   --     Estimated Creatinine Clearance: 117.7 mL/min (A) (by C-G formula based on SCr of 0.58 mg/dL (L)).    Allergies  Allergen Reactions  . Metformin And Related     Pt. States it makes his stomach bleed because of a stomach ulcer  . Motrin [Ibuprofen]     Pt. States it "inflames his stomach, causes bleeding in stomach"    Antimicrobials this admission: Vancomycin 5/19 >> Daptomycin 5/22 >>  Dose adjustments this admission: 5/20 Vanc 1750 mg q12h >> 1500 mg q12h  Microbiology results: 5/18 BCx: 2/4 bottles MRSA 5/21 BCx: 2/4 bottles MRSA 5/22 BCx: sent 5/19 UCx: no growth  5/18 MRSA PCR: negative 5/17 SARS-CoV-2:  negative  Thank you for allowing pharmacy to be a part of this patient's care.  Tobie Lords, PharmD, BCPS Clinical Pharmacist 04/14/2019

## 2019-04-14 NOTE — Progress Notes (Signed)
Pharmacy Electrolyte Monitoring Consult:  Pharmacy consulted to assist in monitoring and replacing electrolytes in this 53 y.o. male admitted on 04/08/2019 with Shortness of Breath and Chest Pain   Labs:  Sodium (mmol/L)  Date Value  04/14/2019 136   Potassium (mmol/L)  Date Value  04/14/2019 3.5   Magnesium (mg/dL)  Date Value  04/11/2019 2.2   Phosphorus (mg/dL)  Date Value  04/09/2019 2.9   Calcium (mg/dL)  Date Value  04/14/2019 7.9 (L)   Albumin (g/dL)  Date Value  04/08/2019 3.0 (L)    Assessment/Plan: Potassium 79mEq PO x 1.   Will obtain follow up BMP with am labs.   Will replace for goal potassium ~ 4 and goal magnesium ~ 2.   Pharmacy will continue to monitor and adjust per consult.   Simpson,Michael L 04/14/2019 4:15 PM

## 2019-04-14 NOTE — Plan of Care (Signed)
  Problem: Education: Goal: Knowledge of General Education information will improve Description: Including pain rating scale, medication(s)/side effects and non-pharmacologic comfort measures Outcome: Progressing   Problem: Activity: Goal: Ability to tolerate increased activity will improve Outcome: Progressing   

## 2019-04-14 NOTE — Progress Notes (Signed)
Dr. Genevive Bi with thoracic surgery is unable to operate on this patient  here at [facility].  Discussed with infectious disease Dr. Tama High.  Call [facility] to request transfer.  Spoke with Dr. Ricard Dillon who did not think patient needs a pericardial surgery.  Suggested pericardiocentesis.  Discussed with Dr. Lorin Mercy of the hospitalist group for transfer to medical service for pericardiocentesis.  Chest should be discussed with interventional cardiology.  Discussed with Dr. Stanford Breed, he felt patient can get pericardiocentesis at [facility] and would not need any thoracic surgery backup.  Discussed with Dr. Stephani Police.  He will review patient's chart and see the patient deciding regarding the procedure.  Not an emergent procedure at this time.

## 2019-04-14 NOTE — Progress Notes (Signed)
Called by [facility] for request to transfer this patient.  Patient with h/o cholangiocarcinoma, osteoarthritis, diabetes, chronic pain, neuropathy who was just discharged from the hospital due to suspected sepsis and noted to have a trivial pericardial effusion who returned for readmission on 5/17. Patient was having chest pain, found to have MRSA bacteremia.  TEE was negative.  Has been on Vanc.  Pericardial effusion with moderate effusion.  Repeat cultures are still positive, uncertain source.  CT abdomen shows mild pancreatic enhancement and rim-enhancement with pericardial effusion.  He likely needs diagnostic pericardiocentesis.  He will need transfer here.  He is afebrile and feels well. Dr. Roxy Manns does not recommend a window but requests that interventional cardiology should be able to do the diagnostic tap.   Dr. Darvin Neighbours has not spoken with cardiology.  I have asked that he speak with cardiology regarding this patient to determine whether they can do the procedure, and also when they would plan to do it.  If the patient does not have tamponade physiology and is otherwise stable, it is possible that they will not do the procedure this weekend and so imminent transfer would not then be necessary.  Will await work back from cardiology/[facility] but assuming cardiology is able to perform the procedure, [facility] will be happy to accept the patient in transfer to telemetry.  Carlyon Shadow, M.D.

## 2019-04-14 NOTE — Progress Notes (Addendum)
[facility] [place] NAME: Kenneth Copeland    MR#:  [id number]  DATE OF BIRTH:  09/16/1966  SUBJECTIVE:   Continues to have chest pain.  Afebrile.  REVIEW OF SYSTEMS:    Review of Systems  Constitutional: Negative for chills and fever.  HENT: Negative for congestion and tinnitus.   Eyes: Negative for blurred vision and double vision.  Respiratory: Negative for cough, shortness of breath and wheezing.   Cardiovascular: Negative for chest pain, orthopnea and PND.  Gastrointestinal: Negative for abdominal pain, diarrhea, nausea and vomiting.  Genitourinary: Negative for dysuria and hematuria.  Neurological: Negative for dizziness, sensory change and focal weakness.  All other systems reviewed and are negative.   Nutrition: Heart Healthy Tolerating Diet: Yes  DRUG ALLERGIES:   Allergies  Allergen Reactions  . Metformin And Related     Pt. States it makes his stomach bleed because of a stomach ulcer  . Motrin [Ibuprofen]     Pt. States it "inflames his stomach, causes bleeding in stomach"    VITALS:  Blood pressure 140/86, pulse 73, temperature 98.3 F (36.8 C), temperature source Oral, resp. rate 19, height 5\' 11"  (1.803 m), weight 89.5 kg, SpO2 94 %.  PHYSICAL EXAMINATION:   Physical Exam  GENERAL:  53 y.o.-year-old patient lying in bed in NAD. EYES: Pupils equal, round, reactive to light and accommodation. No scleral icterus. Extraocular muscles intact.  HEENT: Head atraumatic, normocephalic. Oropharynx and nasopharynx clear.  NECK:  Supple, no jugular venous distention. No thyroid enlargement, no tenderness.  LUNGS: Normal breath sounds bilaterally, no wheezing, rales, rhonchi. No use of accessory muscles of respiration.  CARDIOVASCULAR: S1, S2 normal. No murmurs, rubs, or gallops.  ABDOMEN: Soft, nontender, nondistended. Bowel sounds present. No organomegaly or mass.  EXTREMITIES: No cyanosis, clubbing or edema b/l.     NEUROLOGIC: Cranial nerves II through XII are intact. No focal Motor or sensory deficits b/l.   PSYCHIATRIC: The patient is alert and oriented x 3.  SKIN: No obvious rash, lesion, or ulcer.    LABORATORY PANEL:   CBC Recent Labs  Lab 04/13/19 0602  WBC 8.8  HGB 7.5*  HCT 24.0*  PLT 373   ------------------------------------------------------------------------------------------------------------------  Chemistries  Recent Labs  Lab 04/08/19 0652  04/11/19 0519  04/14/19 0232  NA 132*   < > 132*   < > 136  K 4.1   < > 3.9   < > 3.5  CL 97*   < > 99   < > 98  CO2 21*   < > 25   < > 29  GLUCOSE 156*   < > 303*   < > 265*  BUN 9   < > 16   < > 11  CREATININE 0.53*   < > 0.59*   < > 0.58*  CALCIUM 8.3*   < > 8.1*   < > 7.9*  MG  --    < > 2.2  --   --   AST 25  --   --   --   --   ALT 29  --   --   --   --   ALKPHOS 144*  --   --   --   --   BILITOT 1.0  --   --   --   --    < > = values in this interval not displayed.   ------------------------------------------------------------------------------------------------------------------  Cardiac Enzymes Recent Labs  Lab 04/08/19 1924  TROPONINI <0.03   ------------------------------------------------------------------------------------------------------------------  RADIOLOGY:  Ct Abdomen Pelvis W Contrast  Result Date: 04/13/2019 CLINICAL DATA:  Persistent bacteremia. Evaluate for urinary, prostate or hepatic source. EXAM: CT ABDOMEN AND PELVIS WITH CONTRAST TECHNIQUE: Multidetector CT imaging of the abdomen and pelvis was performed using the standard protocol following bolus administration of intravenous contrast. CONTRAST:  143mL OMNIPAQUE IOHEXOL 300 MG/ML  SOLN COMPARISON:  Chest CT 04/02/2019. FINDINGS: Lower chest: Similar appearance of small pericardial effusion although this shows peripheral rim enhancement on the current study. Small bilateral pleural effusions are associated with bilateral lower lung  collapse/consolidation. Hepatobiliary: No suspicious focal abnormality within the liver parenchyma. Surgical defect noted liver parenchyma near the junction of the right and left hepatic lobes. Gallbladder surgically absent. No intrahepatic or extrahepatic biliary dilation. Pancreas: Hypoenhancement is identified in the tail the pancreas, with low-density material extending into the splenic hilum and anteriorly along the posterior wall the stomach. This is well appreciated on images 20 4-26 of series 2. Pancreatitis/pancreatic necrosis could have this appearance. Pancreatic adenocarcinoma cannot be excluded. Spleen: No splenomegaly. No focal mass lesion. Adrenals/Urinary Tract: No adrenal nodule or mass. Kidneys unremarkable. No evidence for hydroureter. The urinary bladder appears normal for the degree of distention. Stomach/Bowel: Stomach is unremarkable. No gastric wall thickening. No evidence of outlet obstruction. Duodenum is normally positioned as is the ligament of Treitz. No small bowel wall thickening. No small bowel dilatation. The terminal ileum is normal. The appendix is not visualized, but there is no edema or inflammation in the region of the cecum. No gross colonic mass. No colonic wall thickening. Vascular/Lymphatic: There is abdominal aortic atherosclerosis without aneurysm. There is no gastrohepatic or hepatoduodenal ligament lymphadenopathy. No intraperitoneal or retroperitoneal lymphadenopathy. No pelvic sidewall lymphadenopathy. Reproductive: The prostate gland and seminal vesicles are unremarkable. No evidence for prostatic fluid collection to suggest abscess. Other: Trace free fluid noted in the pelvis. Musculoskeletal: Diffuse body wall edema noted. IMPRESSION: 1. The small pericardial effusion persists with interval development of rim enhancement. Peripheral enhancement suggests complexity and may be related to hemorrhage, inflammation, or infection. 2. Low density identified in the tail of  pancreas extending into the splenic hilum and anteriorly towards the posterior wall the stomach. Edema from pancreatitis is a concern and lack of enhancing pancreatic parenchyma raises the question of pancreatic necrosis. This amorphous low-density in the pancreatic tail measures higher than water attenuation and hypoenhancing adenocarcinoma is also consideration. Endoscopic ultrasound may prove helpful to further evaluate. 3. No evidence for hepatic, renal, or prostatic abscess. 4. Bilateral lower lobe collapse/consolidation with small bilateral pleural effusions. 5.  Aortic Atherosclerois (ICD10-170.0) Electronically Signed   By: Misty Stanley M.D.   On: 04/13/2019 16:38     ASSESSMENT AND PLAN:   53 year old male with past medical history of liver cancer, osteoarthritis, diabetes, chronic pain, neuropathy who was just discharged from the hospital due to suspected sepsis and noted to have a trivial pericardial effusion now returns back due to chest pain and shortness of breath.  *  MRSA bacteremia-this was incidentally noted as patient had some blood cultures drawn 2 days ago.  Source of the bacteremia remains unclear.  Seen by infectious disease, continue IV vancomycin.  Appreciate input -Status post TEE showing no evidence of vegetation or endocarditis. - Pericardial effusion appears simple without complicating features per cardiology and cardiothoracic surgery.  No plans on pericardiocentesis -Blood cultures positive for MRSA again.  Will discuss with infectious disease. -Patient will need long-term  IV antibiotics and eventually need a PICC line Blood cultures positive with MRSA on 5/18 and 04/12/2019.  Repeat blood cultures on 04/13/2019 pending.  *  Chest pain/shortness of breath- to be secondary to musculoskeletal/with underlying pericardial effusion. -Patient's cardiac markers x3 were negative.  Seen by cardiology, echocardiogram showing moderate pericardial effusion but no tamponade  physiology. -cont. Supportive care for now.    *  Pericardial effusion- thought to have worsening pericardial effusion but etiology unclear.  No tamponade physiology and seen by CT surgery.  Repeat CT scan of the abdomen showed rim-enhancing which is different from prior CT scan regarding pericardial effusion  Discussed Dr. Roxy Manns of cardiothoracic surgery at [facility].  He did not think patient will need a pericardial window at this time.  Suggested pericardiocentesis.  *A. fib.    Was on amiodarone drip but now weaned off and currently on oral amiodarone.  * COPD - mild acute exacerbation.  - due to ongoing tobacco abuse.  - cont. Prednisone, duonebs.  Improved   *  Chronic pain/neuropathy-patient has radiculopathy in the cervical spine and has chronic pain and neuropathy related to this. -Continue his gabapentin, Cymbalta  Percocet PRN  *  Diabetes type 2 without complication- cont. SSI  - BS stable.   *  History of gout-no acute attack.  Continue colchicine.  All the records are reviewed and case discussed with Care Management/Social Worker. Management plans discussed with the patient, family and they are in agreement.  CODE STATUS: Full code  DVT Prophylaxis: Lovenox  TOTAL TIME TAKING CARE OF THIS PATIENT: 30 minutes.   Leia Alf Altan Kraai M.D on 04/14/2019 at 2:04 PM  Between 7am to 6pm - Pager - [phone]  After 6pm go to [email or web address] - Proofreader  [facility]  Office  [phone]  CC: Primary care physician; Patient, No Pcp Per

## 2019-04-14 NOTE — Progress Notes (Signed)
lab called and said C-reactive protein TEST REQUEST RECEIVED WITHOUT APPROPRIATE SPECIMEN ORDERED TEST(S) CANNOT BE PERFORMED DUE TO AGE OF SPECIMEN.  Notified Concepcion Living, RN

## 2019-04-14 NOTE — Progress Notes (Addendum)
CARDIOTHORACIC SURGERY BRIEF PROGRESS NOTE   Contacted by Dr. Darvin Neighbours via [place] regarding 53 year old patient with complex medical history and prolonged illness with multiple hospitalizations at [facility] who most recently presented with symptoms of shortness of breath and chest discomfort.  During his hospitalization he was found to have a small-moderate sized pericardial effusion.  The patient was evaluated by Dr. Genevive Bi at [facility] who followed the patient for several days but did not perform surgical drainage.  Blood cultures obtained Apr 09, 2019 grew MRSA and repeat blood cultures Apr 12, 2019 have grown MRSA.  The patient was started on antibiotics and follow-up blood cultures obtained yesterday remain no growth so far.  Transesophageal echocardiogram performed yesterday reportedly reveals no signs of bacterial endocarditis and continues to demonstrate a moderate sized pericardial effusion without any signs of pericardial tamponade or other complicating features suggestive of infection.  Previous CT scan of the abdomen and pelvis reportedly demonstrated findings suggestive of proctitis but repeat CT scan performed yesterday was reported to reveal no signs of pelvic fluid collection nor other source for bacteremia, but a persistent small pericardial effusion with the interval development of "rim enhancement".  The patient has been evaluated by the infectious disease team who recommended drainage of the pericardial effusion for diagnostic purposes.  Dr. Genevive Bi reportedly declined to see the patient in follow-up yesterday.  I have personally reviewed the CT scan of the abdomen and pelvis.  The patient's transthoracic and transesophageal echocardiograms performed at [facility] are not currently available for review via [place].  CT scan demonstrates a small sized circumferential pericardial effusion without any complicating features and no findings that might specifically suggest the presence of purulent infection of the  pericardial space, such as loculations or heterogenous fluid characteristics.  The pericardium is not thickened but is prominent.    Without more convincing evidence that the patient needs open surgical drainage of the pericardial space I would recommend considering pericardiocentesis for diagnostic purposes to include sending fluid for both culture and cytology.  If his pericardial fluid shows signs c/w infection he might need pericardial window if the fluid is not adequately drained by pericardiocentesis.  However, I would not proceed with any type of surgical procedure without the ability to personally review the patient's echocardiograms.  Alternatively, the patient could be treated empirically with antibiotics without any type of surgical drainage as long as he continues to improve clinically.  If blood cultures do not clear, fevers persist, or the patient's condition deteriorates in any way I would recommend proceeding with subxiphoid pericardial window if pericardiocentesis cannot be performed and there are no other clear sources of infection.  It is unclear why this could not be accomplished at [facility] and sternotomy would not be recommended under any circumstances other than uncontrollable bleeding from the pericardial space.  Pericardial window could be performed via left mini thoracotomy if the subxyphoid approach cannot be performed because of the patient's previous abdominal surgery.  However, we would be happy to see the patient in consultation if he is transferred to [facility] for further management.  If transfer is planned his recent TEE should be burned on a disk and sent with the patient if it still cannot be viewed in [facility].   I spent in excess of 15 minutes during the conduct of this non face-to-face hospital encounter.     Rexene Alberts, MD 04/14/2019 3:05 PM

## 2019-04-15 LAB — BASIC METABOLIC PANEL
Anion gap: 8 (ref 5–15)
BUN: 9 mg/dL (ref 6–20)
CO2: 32 mmol/L (ref 22–32)
Calcium: 8.2 mg/dL — ABNORMAL LOW (ref 8.9–10.3)
Chloride: 97 mmol/L — ABNORMAL LOW (ref 98–111)
Creatinine, Ser: 0.6 mg/dL — ABNORMAL LOW (ref 0.61–1.24)
GFR calc Af Amer: >60 mL/min (ref >60)
GFR calc non Af Amer: >60 mL/min (ref >60)
Glucose, Bld: 212 mg/dL — ABNORMAL HIGH (ref 70–99)
Potassium: 3.7 mmol/L (ref 3.5–5.1)
Sodium: 137 mmol/L (ref 135–145)

## 2019-04-15 LAB — CULTURE, BLOOD (ROUTINE X 2): Special Requests: ADEQUATE

## 2019-04-15 LAB — VANCOMYCIN, PEAK
Vancomycin Pk: 27 ug/mL — ABNORMAL LOW (ref 30–40)
Vancomycin Pk: 18 ug/mL — ABNORMAL LOW (ref 30–40)

## 2019-04-15 LAB — GLUCOSE, CAPILLARY
Glucose-Capillary: 165 mg/dL — ABNORMAL HIGH (ref 70–99)
Glucose-Capillary: 166 mg/dL — ABNORMAL HIGH (ref 70–99)
Glucose-Capillary: 198 mg/dL — ABNORMAL HIGH (ref 70–99)
Glucose-Capillary: 203 mg/dL — ABNORMAL HIGH (ref 70–99)
Glucose-Capillary: 238 mg/dL — ABNORMAL HIGH (ref 70–99)
Glucose-Capillary: 238 mg/dL — ABNORMAL HIGH (ref 70–99)

## 2019-04-15 LAB — C-REACTIVE PROTEIN: CRP: 3.4 mg/dL — ABNORMAL HIGH

## 2019-04-15 LAB — MAGNESIUM: Magnesium: 1.5 mg/dL — ABNORMAL LOW (ref 1.7–2.4)

## 2019-04-15 MED ORDER — MAGNESIUM SULFATE 4 GM/100ML IV SOLN
4.0000 g | Freq: Once | INTRAVENOUS | Status: AC
  Administered 2019-04-15: 4 g via INTRAVENOUS
  Filled 2019-04-15: qty 100

## 2019-04-15 MED ORDER — MAGNESIUM OXIDE 400 (241.3 MG) MG PO TABS
400.0000 mg | ORAL_TABLET | Freq: Every day | ORAL | Status: AC
  Administered 2019-04-16 – 2019-04-17 (×2): 400 mg via ORAL
  Filled 2019-04-15 (×2): qty 1

## 2019-04-15 MED ORDER — POTASSIUM CHLORIDE CRYS ER 20 MEQ PO TBCR
40.0000 meq | EXTENDED_RELEASE_TABLET | Freq: Once | ORAL | Status: AC
  Administered 2019-04-15: 40 meq via ORAL
  Filled 2019-04-15: qty 2

## 2019-04-15 NOTE — Progress Notes (Signed)
Pharmacy Electrolyte Monitoring Consult:  Pharmacy consulted to assist in monitoring and replacing electrolytes in this 53 y.o. male admitted on 04/08/2019 with Shortness of Breath and Chest Pain   Labs:  Sodium (mmol/L)  Date Value  04/15/2019 137   Potassium (mmol/L)  Date Value  04/15/2019 3.7   Magnesium (mg/dL)  Date Value  04/15/2019 1.5 (L)   Phosphorus (mg/dL)  Date Value  04/09/2019 2.9   Calcium (mg/dL)  Date Value  04/15/2019 8.2 (L)   Albumin (g/dL)  Date Value  04/08/2019 3.0 (L)    Assessment/Plan: Magnesium 4g IV x 1. Potassium 69mEq PO x 1.   Will obtain follow up BMP with am labs.   Will replace for goal potassium ~ 4 and goal magnesium ~ 2.   Pharmacy will continue to monitor and adjust per consult.   Lajuan Kovaleski L 04/15/2019 3:09 PM

## 2019-04-15 NOTE — Progress Notes (Signed)
[place] at [place] NAME: Kenneth Copeland    MR#:  [id number]  DATE OF BIRTH:  13-Apr-1966  SUBJECTIVE:   Continues to have chest pain.  Afebrile.  REVIEW OF SYSTEMS:    Review of Systems  Constitutional: Negative for chills and fever.  HENT: Negative for congestion and tinnitus.   Eyes: Negative for blurred vision and double vision.  Respiratory: Negative for cough, shortness of breath and wheezing.   Cardiovascular: Negative for chest pain, orthopnea and PND.  Gastrointestinal: Negative for abdominal pain, diarrhea, nausea and vomiting.  Genitourinary: Negative for dysuria and hematuria.  Neurological: Negative for dizziness, sensory change and focal weakness.  All other systems reviewed and are negative.   Nutrition: Heart Healthy Tolerating Diet: Yes  DRUG ALLERGIES:   Allergies  Allergen Reactions  . Metformin And Related     Pt. States it makes his stomach bleed because of a stomach ulcer  . Motrin [Ibuprofen]     Pt. States it "inflames his stomach, causes bleeding in stomach"    VITALS:  Blood pressure 112/79, pulse 71, temperature 97.8 F (36.6 C), temperature source Oral, resp. rate 16, height 5\' 11"  (1.803 m), weight 86 kg, SpO2 97 %.  PHYSICAL EXAMINATION:   Physical Exam  GENERAL:  53 y.o.-year-old patient lying in bed in NAD. EYES: Pupils equal, round, reactive to light and accommodation. No scleral icterus. Extraocular muscles intact.  HEENT: Head atraumatic, normocephalic. Oropharynx and nasopharynx clear.  NECK:  Supple, no jugular venous distention. No thyroid enlargement, no tenderness.  LUNGS: Normal breath sounds bilaterally, no wheezing, rales, rhonchi. No use of accessory muscles of respiration.  CARDIOVASCULAR: S1, S2 normal. No murmurs, rubs, or gallops.  ABDOMEN: Soft, nontender, nondistended. Bowel sounds present. No organomegaly or mass.  EXTREMITIES: No cyanosis, clubbing or edema b/l.     NEUROLOGIC: Cranial nerves II through XII are intact. No focal Motor or sensory deficits b/l.   PSYCHIATRIC: The patient is alert and oriented x 3.  SKIN: No obvious rash, lesion, or ulcer.    LABORATORY PANEL:   CBC Recent Labs  Lab 04/13/19 0602  WBC 8.8  HGB 7.5*  HCT 24.0*  PLT 373   ------------------------------------------------------------------------------------------------------------------  Chemistries  Recent Labs  Lab 04/15/19 0242  NA 137  K 3.7  CL 97*  CO2 32  GLUCOSE 212*  BUN 9  CREATININE 0.60*  CALCIUM 8.2*  MG 1.5*   ------------------------------------------------------------------------------------------------------------------  Cardiac Enzymes Recent Labs  Lab 04/08/19 1924  TROPONINI <0.03   ------------------------------------------------------------------------------------------------------------------  RADIOLOGY:  Ct Abdomen Pelvis W Contrast  Result Date: 04/13/2019 CLINICAL DATA:  Persistent bacteremia. Evaluate for urinary, prostate or hepatic source. EXAM: CT ABDOMEN AND PELVIS WITH CONTRAST TECHNIQUE: Multidetector CT imaging of the abdomen and pelvis was performed using the standard protocol following bolus administration of intravenous contrast. CONTRAST:  116mL OMNIPAQUE IOHEXOL 300 MG/ML  SOLN COMPARISON:  Chest CT 04/02/2019. FINDINGS: Lower chest: Similar appearance of small pericardial effusion although this shows peripheral rim enhancement on the current study. Small bilateral pleural effusions are associated with bilateral lower lung collapse/consolidation. Hepatobiliary: No suspicious focal abnormality within the liver parenchyma. Surgical defect noted liver parenchyma near the junction of the right and left hepatic lobes. Gallbladder surgically absent. No intrahepatic or extrahepatic biliary dilation. Pancreas: Hypoenhancement is identified in the tail the pancreas, with low-density material extending into the splenic hilum  and anteriorly along the posterior wall the stomach. This is well appreciated on images  20 4-26 of series 2. Pancreatitis/pancreatic necrosis could have this appearance. Pancreatic adenocarcinoma cannot be excluded. Spleen: No splenomegaly. No focal mass lesion. Adrenals/Urinary Tract: No adrenal nodule or mass. Kidneys unremarkable. No evidence for hydroureter. The urinary bladder appears normal for the degree of distention. Stomach/Bowel: Stomach is unremarkable. No gastric wall thickening. No evidence of outlet obstruction. Duodenum is normally positioned as is the ligament of Treitz. No small bowel wall thickening. No small bowel dilatation. The terminal ileum is normal. The appendix is not visualized, but there is no edema or inflammation in the region of the cecum. No gross colonic mass. No colonic wall thickening. Vascular/Lymphatic: There is abdominal aortic atherosclerosis without aneurysm. There is no gastrohepatic or hepatoduodenal ligament lymphadenopathy. No intraperitoneal or retroperitoneal lymphadenopathy. No pelvic sidewall lymphadenopathy. Reproductive: The prostate gland and seminal vesicles are unremarkable. No evidence for prostatic fluid collection to suggest abscess. Other: Trace free fluid noted in the pelvis. Musculoskeletal: Diffuse body wall edema noted. IMPRESSION: 1. The small pericardial effusion persists with interval development of rim enhancement. Peripheral enhancement suggests complexity and may be related to hemorrhage, inflammation, or infection. 2. Low density identified in the tail of pancreas extending into the splenic hilum and anteriorly towards the posterior wall the stomach. Edema from pancreatitis is a concern and lack of enhancing pancreatic parenchyma raises the question of pancreatic necrosis. This amorphous low-density in the pancreatic tail measures higher than water attenuation and hypoenhancing adenocarcinoma is also consideration. Endoscopic ultrasound may prove  helpful to further evaluate. 3. No evidence for hepatic, renal, or prostatic abscess. 4. Bilateral lower lobe collapse/consolidation with small bilateral pleural effusions. 5.  Aortic Atherosclerois (ICD10-170.0) Electronically Signed   By: Misty Stanley M.D.   On: 04/13/2019 16:38     ASSESSMENT AND PLAN:   53 year old male with past medical history of liver cancer, osteoarthritis, diabetes, chronic pain, neuropathy who was just discharged from the hospital due to suspected sepsis and noted to have a trivial pericardial effusion now returns back due to chest pain and shortness of breath.  *  MRSA bacteremia-this was incidentally noted as patient had some blood cultures drawn 2 days ago.  Source of the bacteremia remains unclear.  Seen by infectious disease, continue IV vancomycin.  Appreciate input -Status post TEE showing no evidence of vegetation or endocarditis. - Pericardial effusion appears simple without complicating features per cardiology and cardiothoracic surgery.  No plans on pericardiocentesis -Blood cultures positive for MRSA again.  Will discuss with infectious disease. -Patient will need long-term IV antibiotics and eventually need a PICC line Blood cultures positive with MRSA on 5/18 and 04/12/2019.  Repeat blood cultures on 04/13/2019 NGTD  *  Chest pain/shortness of breath- to be secondary to musculoskeletal/with underlying pericardial effusion. -Patient's cardiac markers x3 were negative.  Seen by cardiology, echocardiogram showing moderate pericardial effusion but no tamponade physiology. -cont. Supportive care for now.    *  Pericardial effusion- thought to have worsening pericardial effusion but etiology unclear.  No tamponade physiology and seen by CT surgery.  Repeat CT scan of the abdomen showed rim-enhancing which is different from prior CT scan regarding pericardial effusion  Discussed Dr. Roxy Manns of cardiothoracic surgery at [facility].  He did not think patient will  need a pericardial window at this time.  Suggested pericardiocentesis. Discussed with Dr. Stephani Police and tentatively scheduled for Wednesday and he felt this would be a complicaed procedure with low yield.  *A. fib.    Was on amiodarone drip but now weaned off  and currently on oral amiodarone.  * COPD - mild acute exacerbation.  - due to ongoing tobacco abuse.  - cont. Prednisone, duonebs.  Improved   *  Chronic pain/neuropathy-patient has radiculopathy in the cervical spine and has chronic pain and neuropathy related to this. -Continue his gabapentin, Cymbalta  Percocet PRN  *  Diabetes type 2 without complication- cont. SSI  - BS stable.   *  History of gout-no acute attack.  Continue colchicine.  All the records are reviewed and case discussed with Care Management/Social Worker. Management plans discussed with the patient, family and they are in agreement.  CODE STATUS: Full code  DVT Prophylaxis: Lovenox  TOTAL TIME TAKING CARE OF THIS PATIENT: 30 minutes.   Leia Alf Jerone Cudmore M.D on 04/15/2019 at 11:37 AM  Between 7am to 6pm - Pager - [phone]  After 6pm go to [email or web address] - Proofreader  [facility]  Office  [phone]  CC: Primary care physician; Patient, No Pcp Per

## 2019-04-15 NOTE — Progress Notes (Signed)
[facility] Cardiology  SUBJECTIVE: Patient laying in bed, denies fever, has mild pleuritic chest discomfort   Vitals:   04/14/19 2226 04/15/19 0250 04/15/19 0425 04/15/19 0844  BP: (!) 161/95 (!) 141/91 (!) 150/96 112/79  Pulse: 76 86 69 71  Resp:   16 16  Temp:   97.8 F (36.6 C) 97.8 F (36.6 C)  TempSrc:   Oral Oral  SpO2: 98%  96% 97%  Weight:   86 kg   Height:         Intake/Output Summary (Last 24 hours) at 04/15/2019 1059 Last data filed at 04/15/2019 0524 Gross per 24 hour  Intake 793.03 ml  Output 1920 ml  Net -1126.97 ml      PHYSICAL EXAM  General: Well developed, well nourished, in no acute distress HEENT:  Normocephalic and atramatic Neck:  No JVD.  Lungs: Clear bilaterally to auscultation and percussion. Heart: HRRR . Normal S1 and S2 without gallops or murmurs.  Abdomen: Bowel sounds are positive, abdomen soft and non-tender  Msk:  Back normal, normal gait. Normal strength and tone for age. Extremities: No clubbing, cyanosis or edema.   Neuro: Alert and oriented X 3. Psych:  Good affect, responds appropriately   LABS: Basic Metabolic Panel: Recent Labs    04/14/19 0232 04/15/19 0242  NA 136 137  K 3.5 3.7  CL 98 97*  CO2 29 32  GLUCOSE 265* 212*  BUN 11 9  CREATININE 0.58* 0.60*  CALCIUM 7.9* 8.2*  MG  --  1.5*   Liver Function Tests: No results for input(s): AST, ALT, ALKPHOS, BILITOT, PROT, ALBUMIN in the last 72 hours. No results for input(s): LIPASE, AMYLASE in the last 72 hours. CBC: Recent Labs    04/13/19 0602  WBC 8.8  NEUTROABS 6.2  HGB 7.5*  HCT 24.0*  MCV 87.3  PLT 373   Cardiac Enzymes: Recent Labs    04/14/19 0232  CKTOTAL 13*   BNP: Invalid input(s): POCBNP D-Dimer: No results for input(s): DDIMER in the last 72 hours. Hemoglobin A1C: No results for input(s): HGBA1C in the last 72 hours. Fasting Lipid Panel: No results for input(s): CHOL, HDL, LDLCALC, TRIG, CHOLHDL, LDLDIRECT in the last 72 hours. Thyroid  Function Tests: No results for input(s): TSH, T4TOTAL, T3FREE, THYROIDAB in the last 72 hours.  Invalid input(s): FREET3 Anemia Panel: No results for input(s): VITAMINB12, FOLATE, FERRITIN, TIBC, IRON, RETICCTPCT in the last 72 hours.  Ct Abdomen Pelvis W Contrast  Result Date: 04/13/2019 CLINICAL DATA:  Persistent bacteremia. Evaluate for urinary, prostate or hepatic source. EXAM: CT ABDOMEN AND PELVIS WITH CONTRAST TECHNIQUE: Multidetector CT imaging of the abdomen and pelvis was performed using the standard protocol following bolus administration of intravenous contrast. CONTRAST:  174mL OMNIPAQUE IOHEXOL 300 MG/ML  SOLN COMPARISON:  Chest CT 04/02/2019. FINDINGS: Lower chest: Similar appearance of small pericardial effusion although this shows peripheral rim enhancement on the current study. Small bilateral pleural effusions are associated with bilateral lower lung collapse/consolidation. Hepatobiliary: No suspicious focal abnormality within the liver parenchyma. Surgical defect noted liver parenchyma near the junction of the right and left hepatic lobes. Gallbladder surgically absent. No intrahepatic or extrahepatic biliary dilation. Pancreas: Hypoenhancement is identified in the tail the pancreas, with low-density material extending into the splenic hilum and anteriorly along the posterior wall the stomach. This is well appreciated on images 20 4-26 of series 2. Pancreatitis/pancreatic necrosis could have this appearance. Pancreatic adenocarcinoma cannot be excluded. Spleen: No splenomegaly. No focal mass lesion. Adrenals/Urinary Tract:  No adrenal nodule or mass. Kidneys unremarkable. No evidence for hydroureter. The urinary bladder appears normal for the degree of distention. Stomach/Bowel: Stomach is unremarkable. No gastric wall thickening. No evidence of outlet obstruction. Duodenum is normally positioned as is the ligament of Treitz. No small bowel wall thickening. No small bowel dilatation. The  terminal ileum is normal. The appendix is not visualized, but there is no edema or inflammation in the region of the cecum. No gross colonic mass. No colonic wall thickening. Vascular/Lymphatic: There is abdominal aortic atherosclerosis without aneurysm. There is no gastrohepatic or hepatoduodenal ligament lymphadenopathy. No intraperitoneal or retroperitoneal lymphadenopathy. No pelvic sidewall lymphadenopathy. Reproductive: The prostate gland and seminal vesicles are unremarkable. No evidence for prostatic fluid collection to suggest abscess. Other: Trace free fluid noted in the pelvis. Musculoskeletal: Diffuse body wall edema noted. IMPRESSION: 1. The small pericardial effusion persists with interval development of rim enhancement. Peripheral enhancement suggests complexity and may be related to hemorrhage, inflammation, or infection. 2. Low density identified in the tail of pancreas extending into the splenic hilum and anteriorly towards the posterior wall the stomach. Edema from pancreatitis is a concern and lack of enhancing pancreatic parenchyma raises the question of pancreatic necrosis. This amorphous low-density in the pancreatic tail measures higher than water attenuation and hypoenhancing adenocarcinoma is also consideration. Endoscopic ultrasound may prove helpful to further evaluate. 3. No evidence for hepatic, renal, or prostatic abscess. 4. Bilateral lower lobe collapse/consolidation with small bilateral pleural effusions. 5.  Aortic Atherosclerois (ICD10-170.0) Electronically Signed   By: Misty Stanley M.D.   On: 04/13/2019 16:38     Echo normal left ventricular function, LVEF 60 to 65%, mild to moderate pericardial effusion, no evidence for pericardial tamponade  TELEMETRY: Sinus rhythm:  ASSESSMENT AND PLAN:  Active Problems:   Chest pain   Acute respiratory failure ([place])    1.  Mild to moderate pericardial effusion, approximately 1 cm of free fluid at the apex patient with multiple  abdominal surgeries making pericardial window/pericardiocentesis somewhat problematic 2.  MRSA bacteremia 3.  Paroxysmal atrial fibrillation, currently in sinus rhythm at 78 bpm on amiodarone  Recommendations  1.  Agree with current therapy 2.  Empiric antibiotic therapy, with serial 2D echocardiogram versus diagnostic pericardiocentesis with echo guidance tentatively for 04/18/2019   Isaias Cowman, MD, PhD, [facility] 04/15/2019 10:59 AM

## 2019-04-16 DIAGNOSIS — F1410 Cocaine abuse, uncomplicated: Secondary | ICD-10-CM

## 2019-04-16 LAB — MAGNESIUM: Magnesium: 1.9 mg/dL (ref 1.7–2.4)

## 2019-04-16 LAB — GLUCOSE, CAPILLARY
Glucose-Capillary: 76 mg/dL (ref 70–99)
Glucose-Capillary: 110 mg/dL — ABNORMAL HIGH (ref 70–99)
Glucose-Capillary: 191 mg/dL — ABNORMAL HIGH (ref 70–99)
Glucose-Capillary: 115 mg/dL — ABNORMAL HIGH (ref 70–99)
Glucose-Capillary: 163 mg/dL — ABNORMAL HIGH (ref 70–99)

## 2019-04-16 LAB — BASIC METABOLIC PANEL
Anion gap: 10 (ref 5–15)
BUN: 11 mg/dL (ref 6–20)
CO2: 29 mmol/L (ref 22–32)
Calcium: 8.1 mg/dL — ABNORMAL LOW (ref 8.9–10.3)
Chloride: 96 mmol/L — ABNORMAL LOW (ref 98–111)
Creatinine, Ser: 0.59 mg/dL — ABNORMAL LOW (ref 0.61–1.24)
GFR calc Af Amer: >60 mL/min (ref >60)
GFR calc non Af Amer: >60 mL/min (ref >60)
Glucose, Bld: 197 mg/dL — ABNORMAL HIGH (ref 70–99)
Potassium: 4.1 mmol/L (ref 3.5–5.1)
Sodium: 135 mmol/L (ref 135–145)

## 2019-04-16 LAB — VANCOMYCIN, TROUGH: Vancomycin Tr: 13 ug/mL — ABNORMAL LOW (ref 15–20)

## 2019-04-16 NOTE — Progress Notes (Signed)
Pharmacy Electrolyte Monitoring Consult:  Pharmacy consulted to assist in monitoring and replacing electrolytes in this 53 y.o. male admitted on 04/08/2019 with Shortness of Breath and Chest Pain   Labs:  Sodium (mmol/L)  Date Value  04/16/2019 135   Potassium (mmol/L)  Date Value  04/16/2019 4.1   Magnesium (mg/dL)  Date Value  04/16/2019 1.9   Phosphorus (mg/dL)  Date Value  04/09/2019 2.9   Calcium (mg/dL)  Date Value  04/16/2019 8.1 (L)   Albumin (g/dL)  Date Value  04/08/2019 3.0 (L)    Assessment/Plan: K 4.1 Mag 1.9  Scr 0.59 Patient on MagOx 400 PO daily. No other supplementation at this time  Will obtain follow up BMP with am labs.  Will replace for goal potassium ~ 4 and goal magnesium ~ 2.   Pharmacy will continue to monitor and adjust per consult.   Kenneth Copeland A 04/16/2019 1:21 PM

## 2019-04-16 NOTE — Progress Notes (Signed)
Pharmacy Antibiotic Note  Kenneth Copeland is a 53 y.o. male admitted on 04/08/2019 with chest pain and shortness of breath. Pharmacy has been consulted for vancomycin dosing.  Blood cultures with persistent MRSA bacteremia. TEE negative for endocarditis. ID is following the patient.  Plan: Vancomycin 1250 mg IV Q 8 hrs. Goal AUC 400-550. Calculated AUC 407.5. With Peak level at 18 drawn ~2 hours post infusion and trough at 13 drawn ~9 hour post dose.  Cssmin: 13.7 mcg/mL  Increasing the dose to 1500 mg q8H would have a calculated AUC of 488.7 with a Cmin of 16.4 but > 4000 mg/day of vancomycin can increase risk for renal toxicity. Will continue vancomycin 1250 mg q8H.   Height: 5\' 11"  (180.3 cm) Weight: 189 lb 9.6 oz (86 kg) IBW/kg (Calculated) : 75.3  Temp (24hrs), Avg:98 F (36.7 C), Min:97.8 F (36.6 C), Max:98.2 F (36.8 C)  Recent Labs  Lab 04/09/19 0520  04/11/19 0519 04/12/19 0105 04/13/19 0602  04/14/19 0020 04/14/19 0232 04/15/19 0242 04/15/19 1212 04/15/19 2041 04/16/19 0149  WBC 12.7*  --  11.3* 8.9 8.8  --   --   --   --   --   --   --   CREATININE 0.58*   < > 0.59* 0.68 0.55*  --   --  0.58* 0.60*  --   --   --   VANCOTROUGH  --   --   --   --   --   --  9*  --   --   --   --  13*  VANCOPEAK  --   --   --   --   --    < >  --   --   --  27* 18*  --    < > = values in this interval not displayed.    Estimated Creatinine Clearance: 117.7 mL/min (A) (by C-G formula based on SCr of 0.6 mg/dL (L)).    Allergies  Allergen Reactions  . Metformin And Related     Pt. States it makes his stomach bleed because of a stomach ulcer  . Motrin [Ibuprofen]     Pt. States it "inflames his stomach, causes bleeding in stomach"    Antimicrobials this admission: Vancomycin 5/19 >> Daptomycin 5/22 >>  Dose adjustments this admission: 5/20 Vanc 1750 mg q12h >> 1500 mg q12h >> 1250 mg q8H  Microbiology results: 5/18 BCx: 2/4 bottles MRSA 5/21 BCx: 2/4 bottles MRSA 5/22  BCx: sent 5/19 UCx: no growth  5/18 MRSA PCR: negative 5/17 SARS-CoV-2: negative  Thank you for allowing pharmacy to be a part of this patient's care.  Eleonore Chiquito, PharmD, BCPS Clinical Pharmacist 04/16/2019

## 2019-04-16 NOTE — Progress Notes (Signed)
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 Pt was recently in [facility] between 5/10-5/14 for same complaint and was diagnosed with pericardial effusion.  Patient has a complicated medical history.HE has a h/o poorly controlled DM, cocaine use. He moved from [place] to [place] in April. He initially presented to [facility] ED on 4/2 with b/l flank pain and hematuria and was discharged from ED on cipro for 5 daysafter the CT abdomen did not show any renal stones.He went back andwas [facility] 03/19/2019 , withdiarrhea vomiting stomach pain and diffuse myalgia of 4 days duration. On presentation the ED he was afebrile with no leukocytosis however had an elevated lactate of 5.7 and persistent tachycardia. He was initially treated empirically with ceftriaxone and Flagyl. COVID testing was negative. But once he was transferred to the medical floor he had a temperature of 102.2 rectal. And his antibiotic was broadened to Vanco cefepime and Flagyl. During that hospitalization his urine culture was positive for MRSA but blood cultures were negative both on 03/14/2019 and 03/15/2019. C. difficile was negative, urine Legionella was negative, CK was high ( max 1223) and ESR was 86 and CRP 12 , CXR was normal with No focal consolidation, pneumothorax or pleural effusion. Cardiomediastinal silhouette and pulmonary vasculaturewerewithin normal limits. 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 He was discharged onBactrim for 28-day course to complete until 04/14/2019     Subjective: C/o chest pain radiating to left shoulder while taking deep breath Poor appetite , no fever  Medications:  . amiodarone  200 mg Oral BID  . colchicine  0.6 mg Oral BID  . dicyclomine  10 mg Oral QID  . DULoxetine  30 mg Oral Daily  . enoxaparin (LOVENOX) injection  40 mg Subcutaneous Daily  . insulin aspart  0-20 Units Subcutaneous TID WC  . insulin aspart  0-5 Units Subcutaneous QHS  . insulin aspart  5 Units Subcutaneous TID WC  . insulin glargine  40 Units Subcutaneous BID  . magnesium oxide  400 mg Oral Daily  . metoCLOPramide  10 mg Oral TID AC & HS  . neomycin-polymyxin b-dexamethasone  1 drop Left Eye Q6H  . oxyCODONE  10 mg Oral Q12H  . pantoprazole  40 mg Oral BID  . polyvinyl alcohol  2 drop Left Eye TID  . sodium chloride flush  10-40 mL Intracatheter Q12H  . sucralfate  1 g Oral QID  . tamsulosin  0.4 mg Oral Daily    Objective: Vital signs in last 24 hours: Temp:  [97.9 F (36.6 C)-98.2 F (36.8 C)] 97.9 F (36.6 C) (05/25 0748) Pulse Rate:  [87-93] 90 (05/25 0748) Resp:  [16-20] 18 (05/25 0748) BP: (126-139)/(78-91) 127/81 (05/25 0748) SpO2:  [93 %-96 %] 94 % (05/25 0748) Weight:  [83.7 kg] 83.7 kg (05/25 0403)  PHYSICAL EXAM:  General: Alert, cooperative, some distress, appears stated age.  Head: Normocephalic, without obvious abnormality, atraumatic. Eyes: Conjunctivae clear, anicteric sclerae. Pupils are equal ENT Nares normal. No drainage or sinus tenderness. Lips,  mucosa, and tongue normal. No Thrush Neck: Supple, symmetrical, no adenopathy, thyroid: non tender no carotid bruit and no JVD. Back: No CVA tenderness. Lungs:b/l air entry Heart: s1s2Abdomen: Soft,  non-tender,not distended. Bowel sounds normal. No masses Extremities: atraumatic, no cyanosis. No edema. No clubbing Skin: No rashes or lesions. Or bruising Lymph: Cervical, supraclavicular normal. Neurologic: Grossly non-focal  Lab Results Recent Labs    04/14/19 0232 04/15/19 0242  NA 136 137  K 3.5 3.7  CL 98 97*  CO2 29 32  BUN 11 9  CREATININE 0.58* 0.60*   Liver Panel No results for input(s): PROT, ALBUMIN, AST, ALT, ALKPHOS, BILITOT, BILIDIR, IBILI in the last 72 hours. Sedimentation Rate No results for input(s): ESRSEDRATE in the last 72 hours. C-Reactive Protein Recent Labs    04/15/19 0242  CRP 3.4*    Microbiology:  Studies/Results: The small pericardial effusion persists with interval development of rim enhancement. Peripheral enhancement suggests complexity and may be related to hemorrhage, inflammation, or infection. 2. Low density identified in the tail of pancreas extending into the splenic hilum and anteriorly towards the posterior wall the stomach. Edema from pancreatitis is a concern and lack of enhancing pancreatic parenchyma raises the question of pancreatic necrosis. This amorphous low-density in the pancreatic tail measures higher than water attenuation and hypoenhancing adenocarcinoma is also consideration. Endoscopic ultrasound may prove helpful to further evaluate. 3. No evidence for hepatic, renal, or prostatic abscess. 4. Bilateral lower lobe collapse/consolidation with small bilateral pleural effusions. 5.  Aortic Atherosclerois (ICD10-170.0)  Assessment/Plan: 53 yr male with complicated medical history poorly controlled diabetes, pancreatitis, polysubstance abuse, history of intrahepatic cholangiocarcinoma status post partial hepatectomyin 2018and chemotherapywith Folfox, gastroparesis,?  MRSA bacteremia- unclear source- TEE Neg, entire spine imaged and N Repeat blood culture positive again- On Vanco ( MIC 1) will add daptomycin  until vanco becomes therapeutic . Trough still low. Repeat MIC is 1 . Will do blood culture today to see whether the bacteremia has cleared   In Jan 2020 he had cellulitis of the abdomen at the insulin injection site and got bactrim In feb 2020 he had cellulitis and thrombophlebitis at the IV site at [facility] and given bactrim againa In April 2020 hus urine culture had MRSA ( blood culture neg) and he got bactrim again for possible prostatitis  Pericardial effusion ? Bacterial? Viral ? Malignant  Concern for MRSA pericarditis- discussed with Cardiologist Dr.PAraschos, the fluid is minimal and he may be a difficult aspiration especially with previous abdominal surgeires From an antibiotic perspective it will not alter the duration whether he has MRSA in the pericardial fluid or not but from a diagnostic perspective for him and [place] to know what is causing his chest apin and also if he continues to be bacteremic it may be worth getting  diagnostic pericardiocentesis if the procedure will yield some fluid and not risky   Intrahepatic cholangio carcinoma-s/p partial hepatectomy and Chemo CT abdomen showed a pancreatic tail lesion- concerning for adenocarcinoma VS necrosis- EUS and biopsy is recommended  Malignant pericardial effusion?? With h/o hepatic carcinoma and s/p hepatectomy and chemo this should be entertainedbut remote  Anemia - ? Of chronic disease, h/o GI bleed   DM-poorly controlled- management as per primary team   H/o GI bleed  Discussed the management with the patient and his nurse

## 2019-04-16 NOTE — Progress Notes (Signed)
[place] [facility] [place] NAME: Kenneth Copeland    MR#:  [id number]  DATE OF BIRTH:  24-Jan-1966  SUBJECTIVE:   Continues to have chest pain.   Feels fatigued. Poor appetite today  REVIEW OF SYSTEMS:    Review of Systems  Constitutional: Negative for chills and fever.  HENT: Negative for congestion and tinnitus.   Eyes: Negative for blurred vision and double vision.  Respiratory: Negative for cough, shortness of breath and wheezing.   Cardiovascular: Negative for chest pain, orthopnea and PND.  Gastrointestinal: Negative for abdominal pain, diarrhea, nausea and vomiting.  Genitourinary: Negative for dysuria and hematuria.  Neurological: Negative for dizziness, sensory change and focal weakness.  All other systems reviewed and are negative.   Nutrition: Heart Healthy Tolerating Diet: Yes  DRUG ALLERGIES:   Allergies  Allergen Reactions  . Metformin And Related     Pt. States it makes his stomach bleed because of a stomach ulcer  . Motrin [Ibuprofen]     Pt. States it "inflames his stomach, causes bleeding in stomach"    VITALS:  Blood pressure 127/81, pulse 90, temperature 97.9 F (36.6 C), temperature source Oral, resp. rate 18, height 5\' 11"  (1.803 m), weight 83.7 kg, SpO2 94 %.  PHYSICAL EXAMINATION:   Physical Exam  GENERAL:  53 y.o.-year-old patient lying in bed in NAD. EYES: Pupils equal, round, reactive to light and accommodation. No scleral icterus. Extraocular muscles intact.  HEENT: Head atraumatic, normocephalic. Oropharynx and nasopharynx clear.  NECK:  Supple, no jugular venous distention. No thyroid enlargement, no tenderness.  LUNGS: Normal breath sounds bilaterally, no wheezing, rales, rhonchi. No use of accessory muscles of respiration.  CARDIOVASCULAR: S1, S2 normal. No murmurs, rubs, or gallops.  ABDOMEN: Soft, nontender, nondistended. Bowel sounds present. No organomegaly or mass.  EXTREMITIES: No cyanosis,  clubbing or edema b/l.    NEUROLOGIC: Cranial nerves II through XII are intact. No focal Motor or sensory deficits b/l.   PSYCHIATRIC: The patient is alert and oriented x 3.  SKIN: No obvious rash, lesion, or ulcer.    LABORATORY PANEL:   CBC Recent Labs  Lab 04/13/19 0602  WBC 8.8  HGB 7.5*  HCT 24.0*  PLT 373   ------------------------------------------------------------------------------------------------------------------  Chemistries  Recent Labs  Lab 04/15/19 0242  NA 137  K 3.7  CL 97*  CO2 32  GLUCOSE 212*  BUN 9  CREATININE 0.60*  CALCIUM 8.2*  MG 1.5*   ------------------------------------------------------------------------------------------------------------------  Cardiac Enzymes No results for input(s): TROPONINI in the last 168 hours. ------------------------------------------------------------------------------------------------------------------  RADIOLOGY:  No results found.   ASSESSMENT AND PLAN:   53 year old male with past medical history of liver cancer, osteoarthritis, diabetes, chronic pain, neuropathy who was just discharged from the hospital due to suspected sepsis and noted to have a trivial pericardial effusion now returns back due to chest pain and shortness of breath.  *  MRSA bacteremia-this was incidentally noted as patient had some blood cultures drawn 2 days ago.  Source of the bacteremia remains unclear.  Seen by infectious disease, continue IV vancomycin.  Appreciate input -Status post TEE showing no evidence of vegetation or endocarditis. - Pericardial effusion appears simple without complicating features per cardiology and cardiothoracic surgery.  No plans on pericardiocentesis -Blood cultures positive for MRSA again.  Will discuss with infectious disease. -Patient will need long-term IV antibiotics and eventually need a PICC line Blood cultures positive with MRSA on 5/18 and 04/12/2019.  Repeat blood  cultures on 04/13/2019  Negative  *  Chest pain/shortness of breath- to be secondary to musculoskeletal/with underlying pericardial effusion. -Patient's cardiac markers x3 were negative.  Seen by cardiology, echocardiogram showing moderate pericardial effusion but no tamponade physiology. -cont. Supportive care for now.    *  Pericardial effusion-  worsening pericardial effusion but etiology unclear.  No tamponade physiology and seen by CT surgery.  Repeat CT scan of the abdomen showed rim-enhancing which is different from prior CT scan regarding pericardial effusion  Discussed Dr. Roxy Manns of cardiothoracic surgery at [facility].  He did not think patient will need a pericardial window at this time.  Suggested pericardiocentesis. Discussed with Dr. Stephani Police and tentatively scheduled for Wednesday and he felt this would be a complicaed procedure with low yield.  *A. fib.    Was on amiodarone drip but now weaned off and currently on oral amiodarone.  * COPD - mild acute exacerbation.  - due to ongoing tobacco abuse.  - cont. Prednisone, duonebs.  Improved   *  Chronic pain/neuropathy-patient has radiculopathy in the cervical spine and has chronic pain and neuropathy related to this. -Continue his gabapentin, Cymbalta  Percocet PRN  *  Diabetes type 2 without complication- cont. SSI  - BS stable.   *  History of gout-no acute attack.  Continue colchicine.  * Drg abuse Has used cocaine in the past but never IV  All the records are reviewed and case discussed with Care Management/Social Worker. Management plans discussed with the patient, family and they are in agreement.  CODE STATUS: Full code  DVT Prophylaxis: Lovenox  TOTAL TIME TAKING CARE OF THIS PATIENT: 30 minutes.   Kenneth Copeland M.D on 04/16/2019 at 10:59 AM  Between 7am to 6pm - Pager - [phone]  After 6pm go to [email or web address] - Proofreader  [employer]  Hospitalists  Office  [phone]  CC: Primary care  physician; Patient, No Pcp Per

## 2019-04-17 ENCOUNTER — Inpatient Hospital Stay: Payer: Medicare (Managed Care)

## 2019-04-17 DIAGNOSIS — M25519 Pain in unspecified shoulder: Secondary | ICD-10-CM

## 2019-04-17 DIAGNOSIS — Z8619 Personal history of other infectious and parasitic diseases: Secondary | ICD-10-CM

## 2019-04-17 LAB — GLUCOSE, CAPILLARY
Glucose-Capillary: 116 mg/dL — ABNORMAL HIGH (ref 70–99)
Glucose-Capillary: 148 mg/dL — ABNORMAL HIGH (ref 70–99)
Glucose-Capillary: 104 mg/dL — ABNORMAL HIGH (ref 70–99)
Glucose-Capillary: 109 mg/dL — ABNORMAL HIGH (ref 70–99)

## 2019-04-17 LAB — BASIC METABOLIC PANEL
Anion gap: 9 (ref 5–15)
BUN: 12 mg/dL (ref 6–20)
CO2: 30 mmol/L (ref 22–32)
Calcium: 8.4 mg/dL — ABNORMAL LOW (ref 8.9–10.3)
Chloride: 95 mmol/L — ABNORMAL LOW (ref 98–111)
Creatinine, Ser: 0.62 mg/dL (ref 0.61–1.24)
GFR calc Af Amer: >60 mL/min (ref >60)
GFR calc non Af Amer: >60 mL/min (ref >60)
Glucose, Bld: 99 mg/dL (ref 70–99)
Potassium: 4 mmol/L (ref 3.5–5.1)
Sodium: 134 mmol/L — ABNORMAL LOW (ref 135–145)

## 2019-04-17 MED ORDER — VANCOMYCIN HCL 10 G IV SOLR
1250.0000 mg | Freq: Three times a day (TID) | INTRAVENOUS | Status: DC
  Administered 2019-04-17 – 2019-04-18 (×2): 1250 mg via INTRAVENOUS
  Filled 2019-04-17 (×4): qty 1250

## 2019-04-17 NOTE — Progress Notes (Signed)
Date of Admission:  04/08/2019       Subjective: Uncomfortable, still has chest pain and left shoulder pain  Medications:  . amiodarone  200 mg Oral BID  . colchicine  0.6 mg Oral BID  . dicyclomine  10 mg Oral QID  . DULoxetine  30 mg Oral Daily  . enoxaparin (LOVENOX) injection  40 mg Subcutaneous Daily  . insulin aspart  0-20 Units Subcutaneous TID WC  . insulin aspart  0-5 Units Subcutaneous QHS  . insulin aspart  5 Units Subcutaneous TID WC  . insulin glargine  40 Units Subcutaneous BID  . metoCLOPramide  10 mg Oral TID AC & HS  . neomycin-polymyxin b-dexamethasone  1 drop Left Eye Q6H  . oxyCODONE  10 mg Oral Q12H  . pantoprazole  40 mg Oral BID  . polyvinyl alcohol  2 drop Left Eye TID  . sodium chloride flush  10-40 mL Intracatheter Q12H  . sucralfate  1 g Oral QID  . tamsulosin  0.4 mg Oral Daily    Objective: Vital signs in last 24 hours: Temp:  [98.1 F (36.7 C)-98.3 F (36.8 C)] 98.2 F (36.8 C) (05/26 0728) Pulse Rate:  [98-104] 101 (05/26 0728) Resp:  [18-20] 20 (05/26 0538) BP: (111-127)/(80-92) 122/84 (05/26 0728) SpO2:  [94 %-96 %] 95 % (05/26 0728) Weight:  [82.3 kg] 82.3 kg (05/26 0538)  PHYSICAL EXAM:  General: Alert, cooperative, some distress, pale Lungs: b/l air entry crepts bases Heart: s1s2 Abdomen: Soft,  Extremities: atraumatic, no cyanosis. No edema. No clubbing Skin: No rashes or lesions. Or bruising Lymph: Cervical, supraclavicular normal. Neurologic: Grossly non-focal  Lab Results Recent Labs    04/16/19 1159 04/17/19 0537  NA 135 134*  K 4.1 4.0  CL 96* 95*  CO2 29 30  BUN 11 12  CREATININE 0.59* 0.62   Liver Panel No results for input(s): PROT, ALBUMIN, AST, ALT, ALKPHOS, BILITOT, BILIDIR, IBILI in the last 72 hours. Sedimentation Rate No results for input(s): ESRSEDRATE in the last 72 hours. C-Reactive Protein Recent Labs    04/15/19 0242  CRP 3.4*    Microbiology:  Studies/Results: Mr Shoulder Left Wo  Contrast  Result Date: 04/17/2019 CLINICAL DATA:  Left shoulder pain.  Sepsis. EXAM: MRI OF THE LEFT SHOULDER WITHOUT CONTRAST TECHNIQUE: Multiplanar, multisequence MR imaging of the shoulder was performed. No intravenous contrast was administered. COMPARISON:  None. FINDINGS: Rotator cuff:  Intact without significant tendinosis. Muscles:  No focal muscular atrophy or edema. Biceps long head: Intact and normally positioned. Mild tendinosis of the intra-articular portion. Acromioclavicular Joint: The acromion is type 1. There are mild acromioclavicular degenerative changes. No significant fluid is present in the subacromial - subdeltoid bursa. Glenohumeral Joint: Mild glenohumeral degenerative changes. No significant shoulder joint effusion. Labrum: Mild superior labral degeneration without evidence of tear or paralabral cyst. Bones: No acute or significant extra-articular osseous findings. Other: There are no periarticular fluid collections or inflammatory changes. IMPRESSION: 1. No acute findings.  No evidence of septic joint or osteomyelitis. 2. The rotator cuff, biceps tendon and labrum are intact. There is mild bicipital tendinosis and superior labral degeneration. Electronically Signed   By: Richardean Sale M.D.   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  MRSA bacteremia- unclear source-TEE Neg, entire spine imaged and N Repeat blood culture from 5/21 positive again- On  Vanco ( MIC 1) added daptomycin as vanco level was not therapeuttic- he is currently on 1250mg  Iv q8 of vanco and trough is still 13 ( trough is what is monitored as OP and not AUC) Very likely he will go home on IV daptomycin But there are 2 issues with home IV antibiotic coverage- HE does not  have insurance coverage for [place] for home IV and also [facility] because of cocaine use ( pt denies IV, but uses cocaine   In Jan 2020he had cellulitis of the abdomen at the insulin injection site and got bactrim In feb 2020 he had cellulitis and thrombophlebitis at the IV site at [facility] and given bactrim againa In April 2020 hus urine culture had MRSA ( blood culture neg) and he got bactrim again for possible prostatitis  Pericardial effusion ? Bacterial?Viral ?Malignant  Concern for MRSA pericarditis- discussed with Cardiologist Dr.PAraschos, the fluid is minimal and he may be a difficult aspiration especially with previous abdominal surgeries From an antibiotic perspective it will not alter the duration whether he has MRSA in the pericardial fluid or not but from a diagnostic perspective for him and [place] to know what is causing his chest apin and also if he continues to be bacteremic it may be worth getting  diagnostic pericardiocentesis if the procedure will yield some fluid and not risky   Intrahepatic cholangio carcinoma-s/p partial hepatectomy and Chemo CT abdomen showed a pancreatic tail lesion- concerning for adenocarcinoma VS necrosis- EUS and biopsy is recommended  Malignant pericardial effusion?? With h/o hepatic carcinoma and s/p hepatectomy and chemo this should be entertainedbut remote  Anemia - ? Of chronic disease, h/o GI bleed   DM-poorly controlled- management as per primary team     Discussed the management with the patient . Will discuss with hospitalist

## 2019-04-17 NOTE — Progress Notes (Signed)
[name], male   DOB: Jan 22, 1966, 53 y.o.   MRN: [id number]  [facility] PROGRESS NOTE  Kenneth Copeland NIO:[id number] DOB: 1965-12-31 DOA: 04/08/2019 PCP: Patient, No Pcp Per  HPI/Subjective: Patient complains of severe pain in his left shoulder and left upper chest.  Patient states he can move his left shoulder but he states he has been having pain there for a while.  Appetite okay.  Objective: Vitals:   04/17/19 0538 04/17/19 0728  BP: (!) 122/92 122/84  Pulse: (!) 104 (!) 101  Resp: 20   Temp: 98.1 F (36.7 C) 98.2 F (36.8 C)  SpO2: 94% 95%    Filed Weights   04/15/19 0425 04/16/19 0403 04/17/19 0538  Weight: 86 kg 83.7 kg 82.3 kg    ROS: Review of Systems  Constitutional: Negative for chills and fever.  Eyes: Negative for blurred vision.  Respiratory: Negative for cough and shortness of breath.   Cardiovascular: Negative for chest pain.  Gastrointestinal: Negative for abdominal pain, constipation, diarrhea, nausea and vomiting.  Genitourinary: Negative for dysuria.  Musculoskeletal: Positive for joint pain.  Neurological: Negative for dizziness and headaches.   Exam: Physical Exam  Constitutional: He is oriented to person, place, and time.  HENT:  Nose: No mucosal edema.  Mouth/Throat: No oropharyngeal exudate or posterior oropharyngeal edema.  Eyes: Pupils are equal, round, and reactive to light. Conjunctivae, EOM and lids are normal.  Neck: No JVD present. Carotid bruit is not present. No edema present. No thyroid mass and no thyromegaly present.  Cardiovascular: S1 normal and S2 normal. Tachycardia present. Exam reveals no gallop.  No murmur heard. Pulses:      Dorsalis pedis pulses are 2+ on the right side and 2+ on the left side.  Respiratory: No respiratory distress. He has decreased breath sounds in the right lower field. He has no wheezes. He has no rhonchi. He has no rales.  GI: Soft. Bowel sounds are normal. There is no abdominal  tenderness.  Musculoskeletal:     Left shoulder: He exhibits tenderness. He exhibits no swelling.  Lymphadenopathy:    He has no cervical adenopathy.  Neurological: He is alert and oriented to person, place, and time. No cranial nerve deficit.  Skin: Skin is warm. No rash noted. Nails show no clubbing.  Psychiatric: He has a normal mood and affect.      Data Reviewed: Basic Metabolic Panel: Recent Labs  Lab 04/11/19 0519  04/13/19 0602 04/14/19 0232 04/15/19 0242 04/16/19 1159 04/17/19 0537  NA 132*   < > 131* 136 137 135 134*  K 3.9   < > 3.2* 3.5 3.7 4.1 4.0  CL 99   < > 96* 98 97* 96* 95*  CO2 25   < > 27 29 32 29 30  GLUCOSE 303*   < > 180* 265* 212* 197* 99  BUN 16   < > 10 11 9 11 12   CREATININE 0.59*   < > 0.55* 0.58* 0.60* 0.59* 0.62  CALCIUM 8.1*   < > 7.6* 7.9* 8.2* 8.1* 8.4*  MG 2.2  --   --   --  1.5* 1.9  --    < > = values in this interval not displayed.   CBC: Recent Labs  Lab 04/11/19 0519 04/12/19 0105 04/13/19 0602  WBC 11.3* 8.9 8.8  NEUTROABS  --   --  6.2  HGB 7.4* 7.8* 7.5*  HCT 24.1* 25.2* 24.0*  MCV 88.6 87.8 87.3  PLT 357 409*  373   Cardiac Enzymes: Recent Labs  Lab 04/11/19 0519 04/14/19 0232  CKTOTAL 17* 13*   BNP (last 3 results) Recent Labs    04/02/19 0909 04/08/19 0652  BNP 60.0 184.0*      CBG: Recent Labs  Lab 04/16/19 1155 04/16/19 1641 04/16/19 2112 04/17/19 0729 04/17/19 1156  GLUCAP 191* 115* 163* 116* 148*    Recent Results (from the past 240 hour(s))  SARS Coronavirus 2 (CEPHEID - Performed in [place] hospital lab), Hosp Order     Status: None   Collection Time: 04/08/19  7:40 AM  Result Value Ref Range Status   SARS Coronavirus 2 NEGATIVE NEGATIVE Final    Comment: (NOTE) If result is NEGATIVE SARS-CoV-2 target nucleic acids are NOT DETECTED. The SARS-CoV-2 RNA is generally detectable in upper and lower  respiratory specimens during the acute phase of infection. The lowest  concentration of  SARS-CoV-2 viral copies this assay can detect is 250  copies / mL. A negative result does not preclude SARS-CoV-2 infection  and should not be used as the sole basis for treatment or other  patient management decisions.  A negative result may occur with  improper specimen collection / handling, submission of specimen other  than nasopharyngeal swab, presence of viral mutation(s) within the  areas targeted by this assay, and inadequate number of viral copies  (<250 copies / mL). A negative result must be combined with clinical  observations, patient history, and epidemiological information. If result is POSITIVE SARS-CoV-2 target nucleic acids are DETECTED. The SARS-CoV-2 RNA is generally detectable in upper and lower  respiratory specimens dur ing the acute phase of infection.  Positive  results are indicative of active infection with SARS-CoV-2.  Clinical  correlation with patient history and other diagnostic information is  necessary to determine patient infection status.  Positive results do  not rule out bacterial infection or co-infection with other viruses. If result is PRESUMPTIVE POSTIVE SARS-CoV-2 nucleic acids MAY BE PRESENT.   A presumptive positive result was obtained on the submitted specimen  and confirmed on repeat testing.  While 2019 novel coronavirus  (SARS-CoV-2) nucleic acids may be present in the submitted sample  additional confirmatory testing may be necessary for epidemiological  and / or clinical management purposes  to differentiate between  SARS-CoV-2 and other Sarbecovirus currently known to infect humans.  If clinically indicated additional testing with an alternate test  methodology [phone]) is advised. The SARS-CoV-2 RNA is generally  detectable in upper and lower respiratory sp ecimens during the acute  phase of infection. The expected result is Negative. Fact Sheet for Patients:  StrictlyIdeas.no Fact Sheet for Healthcare  Providers: BankingDealers.co.za This test is not yet approved or cleared by the [place] FDA and has been authorized for detection and/or diagnosis of SARS-CoV-2 by FDA under an Emergency Use Authorization (EUA).  This EUA will remain in effect (meaning this test can be used) for the duration of the COVID-19 declaration under Section 564(b)(1) of the Act, 21 U.S.C. section 360bbb-3(b)(1), unless the authorization is terminated or revoked sooner. Performed at [facility], [address] [place], [place] [address]   MRSA PCR Screening     Status: None   Collection Time: 04/09/19  8:48 AM  Result Value Ref Range Status   MRSA by PCR NEGATIVE NEGATIVE Final    Comment:        The GeneXpert MRSA Assay (FDA approved for NASAL specimens only), is one component of a comprehensive MRSA  colonization surveillance program. It is not intended to diagnose MRSA infection nor to guide or monitor treatment for MRSA infections. Performed at [facility], [address] [place], [place] [address]   CULTURE, BLOOD (ROUTINE X 2) w Reflex to ID Panel     Status: Abnormal   Collection Time: 04/09/19  8:32 PM  Result Value Ref Range Status   Specimen Description   Final    BLOOD PORTA CATH Performed at [facility], [address], [place], [place] [address]    Special Requests   Final    BOTTLES DRAWN AEROBIC AND ANAEROBIC Blood Culture results may not be optimal due to an excessive volume of blood received in culture bottles Performed at [facility], [address], [place], [place] [address]    Culture  Setup Time   Final    GRAM POSITIVE COCCI AEROBIC BOTTLE ONLY CRITICAL RESULT CALLED TO, READ BACK BY AND VERIFIED WITH: Rito Ehrlich AT 8786 04/10/19 SDR Performed at [facility], [place] [address] [place], [place] [address]    Culture METHICILLIN RESISTANT STAPHYLOCOCCUS AUREUS (A)  Final   Report Status 04/12/2019  FINAL  Final   Organism ID, Bacteria METHICILLIN RESISTANT STAPHYLOCOCCUS AUREUS  Final      Susceptibility   Methicillin resistant staphylococcus aureus - MIC*    CIPROFLOXACIN >=8 RESISTANT Resistant     ERYTHROMYCIN >=8 RESISTANT Resistant     GENTAMICIN <=0.5 SENSITIVE Sensitive     OXACILLIN >=4 RESISTANT Resistant     TETRACYCLINE <=1 SENSITIVE Sensitive     VANCOMYCIN 1 SENSITIVE Sensitive     TRIMETH/SULFA 80 RESISTANT Resistant     CLINDAMYCIN >=8 RESISTANT Resistant     RIFAMPIN <=0.5 SENSITIVE Sensitive     Inducible Clindamycin NEGATIVE Sensitive     * METHICILLIN RESISTANT STAPHYLOCOCCUS AUREUS  Blood Culture ID Panel (Reflexed)     Status: Abnormal   Collection Time: 04/09/19  8:32 PM  Result Value Ref Range Status   Enterococcus species NOT DETECTED NOT DETECTED Final   Listeria monocytogenes NOT DETECTED NOT DETECTED Final   Staphylococcus species DETECTED (A) NOT DETECTED Final    Comment: CRITICAL RESULT CALLED TO, READ BACK BY AND VERIFIED WITH: WALID NAZIRI AT 1152 ON 04/10/2019 BY SDR. [facility]    Staphylococcus aureus (BCID) DETECTED (A) NOT DETECTED Final    Comment: Methicillin (oxacillin)-resistant Staphylococcus aureus (MRSA). MRSA is predictably resistant to beta-lactam antibiotics (except ceftaroline). Preferred therapy is vancomycin unless clinically contraindicated. Patient requires contact precautions if  hospitalized. CRITICAL RESULT CALLED TO, READ BACK BY AND VERIFIED WITH: WALID NAZIRI AT 9470 ON 04/10/2019 BY SDR. [place]    Methicillin resistance DETECTED (A) NOT DETECTED Final    Comment: CRITICAL RESULT CALLED TO, READ BACK BY AND VERIFIED WITH: WALID NAZARI AT 9628 ON 04/10/2019 BY SDR. [place]    Streptococcus species NOT DETECTED NOT DETECTED Final   Streptococcus agalactiae NOT DETECTED NOT DETECTED Final   Streptococcus pneumoniae NOT DETECTED NOT DETECTED Final   Streptococcus pyogenes NOT DETECTED NOT DETECTED Final   Acinetobacter baumannii NOT  DETECTED NOT DETECTED Final   Enterobacteriaceae species NOT DETECTED NOT DETECTED Final   Enterobacter cloacae complex NOT DETECTED NOT DETECTED Final   Escherichia coli NOT DETECTED NOT DETECTED Final   Klebsiella oxytoca NOT DETECTED NOT DETECTED Final   Klebsiella pneumoniae NOT DETECTED NOT DETECTED Final   Proteus species NOT DETECTED NOT DETECTED Final   Serratia marcescens NOT DETECTED NOT DETECTED Final  Haemophilus influenzae NOT DETECTED NOT DETECTED Final   Neisseria meningitidis NOT DETECTED NOT DETECTED Final   Pseudomonas aeruginosa NOT DETECTED NOT DETECTED Final   Candida albicans NOT DETECTED NOT DETECTED Final   Candida glabrata NOT DETECTED NOT DETECTED Final   Candida krusei NOT DETECTED NOT DETECTED Final   Candida parapsilosis NOT DETECTED NOT DETECTED Final   Candida tropicalis NOT DETECTED NOT DETECTED Final    Comment: Performed at [facility], [address] [place], [place] [address]  CULTURE, BLOOD (ROUTINE X 2) w Reflex to ID Panel     Status: Abnormal   Collection Time: 04/09/19  9:02 PM  Result Value Ref Range Status   Specimen Description   Final    BLOOD LEFT HAND Performed at [facility], [address] [place], [place] [address]    Special Requests   Final    BOTTLES DRAWN AEROBIC AND ANAEROBIC Blood Culture results may not be optimal due to an inadequate volume of blood received in culture bottles Performed at [facility], [address] [place], [place] [address]    Culture  Setup Time   Final    GRAM POSITIVE COCCI IN BOTH AEROBIC AND ANAEROBIC BOTTLES CRITICAL VALUE NOTED.  VALUE IS CONSISTENT WITH PREVIOUSLY REPORTED AND CALLED VALUE. Performed at [facility], [address] [place], [place] [address]    Culture (A)  Final    STAPHYLOCOCCUS AUREUS SUSCEPTIBILITIES PERFORMED ON PREVIOUS CULTURE WITHIN THE LAST 5 DAYS. Performed at [facility], [place] [address], [place], [place]  [address]    Report Status 04/12/2019 FINAL  Final  Urine Culture     Status: None   Collection Time: 04/10/19  3:41 AM  Result Value Ref Range Status   Specimen Description   Final    URINE, RANDOM Performed at [facility], [address], [place], [place] [address]    Special Requests   Final    NONE Performed at [facility], [address] [place], [place] [address]    Culture   Final    NO GROWTH Performed at [facility], [place] [address], [place], [place] [address]    Report Status 04/11/2019 FINAL  Final  Culture, blood (Routine X 2) w Reflex to ID Panel     Status: Abnormal   Collection Time: 04/12/19  1:04 AM  Result Value Ref Range Status   Specimen Description   Final    BLOOD BLOOD LEFT FOREARM Performed at [facility], [address] [place], [place] [address]    Special Requests   Final    BOTTLES DRAWN AEROBIC ONLY Blood Culture results may not be optimal due to an inadequate volume of blood received in culture bottles Performed at [facility], [address] [place], [place] [address]    Culture  Setup Time   Final    GRAM POSITIVE COCCI AEROBIC BOTTLE ONLY CRITICAL RESULT CALLED TO, READ BACK BY AND VERIFIED WITH: JASON ROBBINS ON 04/12/2019 AT 2105 [facility] Performed at [facility], [address] [place], [place] [address]    Culture (A)  Final    STAPHYLOCOCCUS AUREUS SUSCEPTIBILITIES PERFORMED ON PREVIOUS CULTURE WITHIN THE LAST 5 DAYS. Performed at [facility], [place] [address], [place],  [address]    Report Status 04/15/2019 FINAL  Final  Culture, blood (Routine X 2) w Reflex to ID Panel     Status: Abnormal   Collection Time:  04/12/19  1:04 AM  Result Value Ref Range Status   Specimen Description   Final    BLOOD BLOOD LEFT HAND Performed at [facility], [address] [place], [place] [address]    Special Requests   Final    BOTTLES DRAWN AEROBIC AND ANAEROBIC Blood  Culture adequate volume Performed at [facility], [address] [place], [place] [address]    Culture  Setup Time   Final    Organism ID to follow GRAM POSITIVE COCCI AEROBIC BOTTLE ONLY CRITICAL RESULT CALLED TO, READ BACK BY AND VERIFIED WITH: JASON ROBBINS ON 04/12/2019 AT 2105 [facility] Performed at [facility], [address] [place], [place] [address]    Culture METHICILLIN RESISTANT STAPHYLOCOCCUS AUREUS (A)  Final   Report Status 04/15/2019 FINAL  Final   Organism ID, Bacteria METHICILLIN RESISTANT STAPHYLOCOCCUS AUREUS  Final      Susceptibility   Methicillin resistant staphylococcus aureus - MIC*    CIPROFLOXACIN >=8 RESISTANT Resistant     ERYTHROMYCIN >=8 RESISTANT Resistant     GENTAMICIN <=0.5 SENSITIVE Sensitive     OXACILLIN >=4 RESISTANT Resistant     TETRACYCLINE <=1 SENSITIVE Sensitive     VANCOMYCIN 1 SENSITIVE Sensitive     TRIMETH/SULFA >=320 RESISTANT Resistant     CLINDAMYCIN >=8 RESISTANT Resistant     RIFAMPIN <=0.5 SENSITIVE Sensitive     Inducible Clindamycin NEGATIVE Sensitive     * METHICILLIN RESISTANT STAPHYLOCOCCUS AUREUS  Blood Culture ID Panel (Reflexed)     Status: Abnormal   Collection Time: 04/12/19  1:04 AM  Result Value Ref Range Status   Enterococcus species NOT DETECTED NOT DETECTED Final   Listeria monocytogenes NOT DETECTED NOT DETECTED Final   Staphylococcus species DETECTED (A) NOT DETECTED Final    Comment: CRITICAL RESULT CALLED TO, READ BACK BY AND VERIFIED WITH: CALLED JASON ROBBINS @2105  04/12/2019 S[facility]    Staphylococcus aureus (BCID) DETECTED (A) NOT DETECTED Final    Comment: Methicillin (oxacillin)-resistant Staphylococcus aureus (MRSA). MRSA is predictably resistant to beta-lactam antibiotics (except ceftaroline). Preferred therapy is vancomycin unless clinically contraindicated. Patient requires contact precautions if  hospitalized. CRITICAL RESULT CALLED TO, READ BACK BY AND VERIFIED WITH: JASON ROBBINS  @2105  04/12/2019 BY SAC.PMF    Methicillin resistance DETECTED (A) NOT DETECTED Final    Comment: CRITICAL RESULT CALLED TO, READ BACK BY AND VERIFIED WITH: JASON ROBBINS @2105  04/12/2019 BY SAC.PMF    Streptococcus species NOT DETECTED NOT DETECTED Final   Streptococcus agalactiae NOT DETECTED NOT DETECTED Final   Streptococcus pneumoniae NOT DETECTED NOT DETECTED Final   Streptococcus pyogenes NOT DETECTED NOT DETECTED Final   Acinetobacter baumannii NOT DETECTED NOT DETECTED Final   Enterobacteriaceae species NOT DETECTED NOT DETECTED Final   Enterobacter cloacae complex NOT DETECTED NOT DETECTED Final   Escherichia coli NOT DETECTED NOT DETECTED Final   Klebsiella oxytoca NOT DETECTED NOT DETECTED Final   Klebsiella pneumoniae NOT DETECTED NOT DETECTED Final   Proteus species NOT DETECTED NOT DETECTED Final   Serratia marcescens NOT DETECTED NOT DETECTED Final   Haemophilus influenzae NOT DETECTED NOT DETECTED Final   Neisseria meningitidis NOT DETECTED NOT DETECTED Final   Pseudomonas aeruginosa NOT DETECTED NOT DETECTED Final   Candida albicans NOT DETECTED NOT DETECTED Final   Candida glabrata NOT DETECTED NOT DETECTED Final   Candida krusei NOT DETECTED NOT DETECTED Final   Candida parapsilosis NOT DETECTED NOT DETECTED Final   Candida tropicalis NOT DETECTED NOT DETECTED Final  Comment: Performed at [facility], [address] [place], [place] [address]  CULTURE, BLOOD (ROUTINE X 2) w Reflex to ID Panel     Status: None (Preliminary result)   Collection Time: 04/13/19  1:05 PM  Result Value Ref Range Status   Specimen Description BLOOD LEFT HAND  Final   Special Requests   Final    BOTTLES DRAWN AEROBIC AND ANAEROBIC Blood Culture adequate volume   Culture   Final    NO GROWTH 4 DAYS Performed at [facility], [address], [place], [place] [address]    Report Status PENDING  Incomplete  CULTURE, BLOOD (ROUTINE X 2) w Reflex to ID Panel      Status: None (Preliminary result)   Collection Time: 04/13/19  1:40 PM  Result Value Ref Range Status   Specimen Description BLOOD BLOOD LEFT HAND  Final   Special Requests   Final    BOTTLES DRAWN AEROBIC AND ANAEROBIC Blood Culture adequate volume   Culture   Final    NO GROWTH 4 DAYS Performed at [facility], [address], [place], [place] [address]    Report Status PENDING  Incomplete  CULTURE, BLOOD (ROUTINE X 2) w Reflex to ID Panel     Status: None (Preliminary result)   Collection Time: 04/16/19 11:59 AM  Result Value Ref Range Status   Specimen Description BLOOD [facility]  Final   Special Requests   Final    BOTTLES DRAWN AEROBIC AND ANAEROBIC Blood Culture adequate volume   Culture   Final    NO GROWTH < 24 HOURS Performed at [facility], [address] [place], [place] [address]    Report Status PENDING  Incomplete  CULTURE, BLOOD (ROUTINE X 2) w Reflex to ID Panel     Status: None (Preliminary result)   Collection Time: 04/16/19  1:09 PM  Result Value Ref Range Status   Specimen Description BLOOD LW  Final   Special Requests   Final    BOTTLES DRAWN AEROBIC AND ANAEROBIC Blood Culture adequate volume   Culture   Final    NO GROWTH < 24 HOURS Performed at [facility], [address] [place], [place] [address]    Report Status PENDING  Incomplete     Studies: No results found.  Scheduled Meds: . amiodarone  200 mg Oral BID  . colchicine  0.6 mg Oral BID  . dicyclomine  10 mg Oral QID  . DULoxetine  30 mg Oral Daily  . enoxaparin (LOVENOX) injection  40 mg Subcutaneous Daily  . insulin aspart  0-20 Units Subcutaneous TID WC  . insulin aspart  0-5 Units Subcutaneous QHS  . insulin aspart  5 Units Subcutaneous TID WC  . insulin glargine  40 Units Subcutaneous BID  . metoCLOPramide  10 mg Oral TID AC & HS  . neomycin-polymyxin b-dexamethasone  1 drop Left Eye Q6H  . oxyCODONE  10 mg Oral Q12H  . pantoprazole  40 mg Oral BID  .  polyvinyl alcohol  2 drop Left Eye TID  . sodium chloride flush  10-40 mL Intracatheter Q12H  . sucralfate  1 g Oral QID  . tamsulosin  0.4 mg Oral Daily   Continuous Infusions: . sodium chloride Stopped (04/15/19 0507)  . DAPTOmycin (CUBICIN)  IV Stopped (04/16/19 2313)  . vancomycin Stopped (04/17/19 0511)    Assessment/Plan:  1. MRSA bacteremia.  Patient seen by infectious disease.  Patient on IV vancomycin.  Daptomycin  was added until vancomycin levels get up in therapeutic range.  TEE negative.  Patient has a pericardial effusion and pericardial centesis would not change length of therapy so this was deferred.  Patient has a PICC line. 2. Left shoulder pain and left upper chest pain.  Spoke with orthopedic surgery and we ordered an MRI of the left shoulder. 3. Pericardial effusion.  No tamponade seen.  My associate spoke with cardiothoracic surgery at [facility] and the patient does not need a pericardial window. 4. Atrial fibrillation.  Now on oral amiodarone 5. COPD.  Continue nebulizer treatments.  Off steroids. 6. Chronic pain and neuropathy continue gabapentin and Cymbalta and PRN pain medications 7. Type 2 diabetes mellitus without complication on sliding scale and glargine insulin  Code Status:     Code Status Orders  (From admission, onward)         Start     Ordered   04/08/19 1225  Full code  Continuous     04/08/19 1224        Code Status History    Date Active Date Inactive Code Status Order ID Comments User Context   04/02/2019 1332 04/05/2019 1916 Full Code [id number]  Lang Snow, NP ED      Disposition Plan: Disposition will be an issue without insurance.  Consultants:  Infectious disease  Cardiology  Orthopedic surgery  Procedures:  TEE  Antibiotics:  Vancomycin and daptomycin  Time spent: 28 minutes  [place]

## 2019-04-17 NOTE — Consult Note (Signed)
ORTHOPAEDIC CONSULTATION  REQUESTING PHYSICIAN: Loletha Grayer, MD  Chief Complaint: Left shoulder and chest pain  HPI: Kenneth Copeland is a 53 y.o. male who complains of left shoulder and chest pain.  Patient states the pain seems to start in his chest and radiates to the left shoulder.  Pain is exacerbated with taking a deep breath.  Movement of the shoulder also increases his pain.  He denies numbness or tingling in the left upper extremity.  Patient denies any recent injury.  At baseline he states he does not perform any heavy manual labor or lifting with his left shoulder.  He denies any pain in the right shoulder.  Patient states the pain has been present ever since he was admitted to the hospital.  Patient is a long complicated medical history is as noted by numerous consultants in the [facility] medical record.  Patient recently moved here from [place].  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  Patient has been diagnosed with MRSA bacteremia.  Patient has been noted to have a pericardial effusion, the volume of which is felt to be too small for aspiration.  TEE demonstrates no sign of bacterial endocarditis.  Past Medical History:  Diagnosis Date  . Arthritis   . Cancer ([place])   . Diabetes mellitus without complication ([place])   . Hypertension   . Liver cancer [facility])    Past Surgical History:  Procedure Laterality Date  . HERNIA REPAIR    . LIVER LOBECTOMY    . TEE WITHOUT CARDIOVERSION N/A 04/11/2019   Procedure: TRANSESOPHAGEAL ECHOCARDIOGRAM (TEE);  Surgeon: Corey Skains, MD;  Location: [facility] ORS;  Service: Cardiovascular;  Laterality: N/A;   Social History   Socioeconomic History  . Marital status: Divorced    Spouse name: Not on file  . Number of children: Not on file  . Years of education: Not on file  . Highest education level: Not on file   Occupational History  . Not on file  Social Needs  . Financial resource strain: Not on file  . Food insecurity:    Worry: Not on file    Inability: Not on file  . Transportation needs:    Medical: Not on file    Non-medical: Not on file  Tobacco Use  . Smoking status: Former Research scientist (life sciences)  . Smokeless tobacco: Never Used  Substance and Sexual Activity  . Alcohol use: Not Currently  . Drug use: Yes    Frequency: 0.5 times per week    Types: Marijuana  . Sexual activity: Yes  Lifestyle  . Physical activity:    Days per week: Not on file    Minutes per session: Not on file  . Stress: Not on file  Relationships  . Social connections:    Talks on phone: Not on file    Gets together: Not on file    Attends religious service: Not on file    Active member of club or organization: Not on file    Attends meetings of clubs or organizations: Not on file    Relationship status: Not on file  Other Topics Concern  . Not on file  Social History Narrative  . Not on file   Family History  Family history unknown: Yes   Allergies  Allergen Reactions  . Metformin And Related     Pt. States it makes his stomach bleed because of a stomach ulcer  . Motrin [Ibuprofen]  Pt. States it "inflames his stomach, causes bleeding in stomach"   Prior to Admission medications   Medication Sig Start Date End Date Taking? Authorizing Provider  acetaminophen (TYLENOL) 325 MG tablet Take 650 mg by mouth every 6 (six) hours as needed for mild pain or fever.   Yes [provider]  Artificial Tear Ointment (AKWA TEARS) 01-06-82 % OINT Place 1 drop into the left eye 2 (two) times daily as needed for dry eyes. 03/19/19 04/18/19 Yes [provider]  carboxymethylcellulose (REFRESH PLUS) 0.5 % SOLN Place 2 drops into the left eye 3 (three) times daily.   Yes [provider]  colchicine 0.6 MG tablet Take 1 tablet (0.6 mg total) by mouth 2 (two) times daily. 04/05/19  Yes Gladstone Lighter,  MD  dicyclomine (BENTYL) 10 MG capsule Take 10 mg by mouth 4 (four) times daily.    Yes [provider]  DULoxetine (CYMBALTA) 30 MG capsule Take 1 capsule (30 mg total) by mouth daily. 04/05/19 07/04/19 Yes Gladstone Lighter, MD  gabapentin (NEURONTIN) 300 MG capsule Take 1 capsule (300 mg total) by mouth 3 (three) times daily. 04/05/19  Yes Gladstone Lighter, MD  insulin glargine (LANTUS) 100 UNIT/ML injection Inject 40 Units into the skin 2 (two) times daily.    Yes [provider]  insulin lispro (HUMALOG) 100 UNIT/ML injection Inject 10 Units into the skin 3 (three) times daily before meals.    Yes [provider]  metoCLOPramide (REGLAN) 10 MG tablet Take 10 mg by mouth 4 (four) times daily -  before meals and at bedtime.    Yes [provider]  neomycin-polymyxin-hydrocortisone (CORTISPORIN) 3.5-10000-1 ophthalmic suspension Place 1 drop into the left eye every 6 (six) hours. 03/19/19 04/18/19 Yes [provider]  oxyCODONE-acetaminophen (PERCOCET) 7.5-325 MG tablet Take 2 tablets by mouth every 6 (six) hours as needed for severe pain. 04/05/19  Yes Gladstone Lighter, MD  pantoprazole (PROTONIX) 40 MG tablet Take 40 mg by mouth 2 (two) times daily.    Yes [provider]  sucralfate (CARAFATE) 1 g tablet Take 1 g by mouth 4 (four) times daily.    Yes [provider]  tamsulosin (FLOMAX) 0.4 MG CAPS capsule Take 1 capsule (0.4 mg total) by mouth daily. 04/05/19  Yes Gladstone Lighter, MD   Mr Shoulder Left Wo Contrast  Result Date: 04/17/2019 CLINICAL DATA:  Left shoulder pain.  Sepsis. EXAM: MRI OF THE LEFT SHOULDER WITHOUT CONTRAST TECHNIQUE: Multiplanar, multisequence MR imaging of the shoulder was performed. No intravenous contrast was administered. COMPARISON:  None. FINDINGS: Rotator cuff:  Intact without significant tendinosis. Muscles:  No focal muscular atrophy or edema. Biceps long head: Intact and normally positioned. Mild  tendinosis of the intra-articular portion. Acromioclavicular Joint: The acromion is type 1. There are mild acromioclavicular degenerative changes. No significant fluid is present in the subacromial - subdeltoid bursa. Glenohumeral Joint: Mild glenohumeral degenerative changes. No significant shoulder joint effusion. Labrum: Mild superior labral degeneration without evidence of tear or paralabral cyst. Bones: No acute or significant extra-articular osseous findings. Other: There are no periarticular fluid collections or inflammatory changes. IMPRESSION: 1. No acute findings.  No evidence of septic joint or osteomyelitis. 2. The rotator cuff, biceps tendon and labrum are intact. There is mild bicipital tendinosis and superior labral degeneration. Electronically Signed   By: Richardean Sale M.D.   On: 04/17/2019 13:22    Positive ROS: All other systems have been reviewed and were otherwise negative with the exception of  those mentioned in the HPI and as above.  Physical Exam: General: Alert, patient appears uncomfortable and frequently moves his lower extremities and shifts his upper body during my encounter.  MUSCULOSKELETAL: Left shoulder: Patient skin is intact.  There is no erythema ecchymosis or significant swelling.  He has intact sensation light touch throughout the left upper extremity.  Patient demonstrates active forward elevation and abduction to approximately 80 degrees with pain.  Internal and external rotation passively and actively with his arm by side causes left shoulder pain.  Patient has full digital wrist and elbow range of motion without pain.  He has a palpable radial pulse.  He has tenderness to palpation globally around the left shoulder extending into the left chest.  Assessment: Left shoulder and chest pain.  Referred pain most likely etiology as there is no evidence of structural injury or septic joint on MRI.    Plan: I had recommended an MRI of the left shoulder be done today.   I reviewed the results and the radiology report.  There is no evidence of structural injury to the shoulder.  The rotator cuff tendons are intact.  There is no muscle edema or atrophy.  There is no evidence of a labral tear despite this being a non-contrast MRI.  Patient has a mild degenerative changes of the acromioclavicular and glenohumeral joints.  There is no large joint effusion and no significant fluid collection in the sub-acromial/subdeltoid space to suggest bursitis.  Biceps tendon is intact with mild tendinosis.  Patient has had longstanding substernal chest pain.  It is most likely his left shoulder pain is referred.  I do not see any need for orthopedic intervention at this time.  I do not see any need for shoulder aspiration as there is no significant effusion of the glenohumeral joint or other fluid collections.    Patient may have early adhesive capsulitis which can begin as a result of joint immobilization or cardiac pathology.  Initial treatment is with oral steroids or a intra-articular corticosteroid injection into the glenohumeral joint under fluoroscopic guidance by the radiologist.  However, given the patient's active MRSA bacteremia, treatment with steroids is likely not advised at this time.   Patient has a history of a GI bleed per the medical record and unfortunately is not a good candidate for treatment with NSAIDs.  There is mention in the patient's chart of chronic pain with cervical radiculopathy.  Patient's current pain symptoms are not in a typical dermatomal distribution to sugges cervical radiculopathy, but is a consideration for etiology of his left shoulder pain.  Continue current pain management.  Patient may benefit from physical therapy for his left shoulder to improve range of motion when medically appropriate.      Thornton Park, MD    04/17/2019 6:30 PM

## 2019-04-17 NOTE — Consult Note (Signed)
Patient off the floor.  Will see patient later.

## 2019-04-17 NOTE — Progress Notes (Signed)
[facility]  SUBJECTIVE: Patient laying in bed, denies fever   Vitals:   04/16/19 1645 04/16/19 2037 04/17/19 0538 04/17/19 0728  BP: 111/80 127/89 (!) 122/92 122/84  Pulse: (!) 101 98 (!) 104 (!) 101  Resp: 20 18 20    Temp: 98.3 F (36.8 C) 98.2 F (36.8 C) 98.1 F (36.7 C) 98.2 F (36.8 C)  TempSrc: Oral Oral Oral Oral  SpO2: 96% 94% 94% 95%  Weight:   82.3 kg   Height:         Intake/Output Summary (Last 24 hours) at 04/17/2019 0827 Last data filed at 04/17/2019 8921 Gross per 24 hour  Intake -  Output 1275 ml  Net -1275 ml      PHYSICAL EXAM  General: Chronically ill-appearing HEENT:  Normocephalic and atramatic Neck:  No JVD.  Lungs: Clear bilaterally to auscultation and percussion. Heart: HRRR . Normal S1 and S2 without gallops or murmurs.  Abdomen: Bowel sounds are positive, abdomen soft and non-tender  Msk:  Back normal, normal gait. Normal strength and tone for age. Extremities: No clubbing, cyanosis or edema.   Neuro: Alert and oriented X 3. Psych:  Good affect, responds appropriately   LABS: Basic Metabolic Panel: Recent Labs    04/15/19 0242 04/16/19 1159 04/17/19 0537  NA 137 135 134*  K 3.7 4.1 4.0  CL 97* 96* 95*  CO2 32 29 30  GLUCOSE 212* 197* 99  BUN 9 11 12   CREATININE 0.60* 0.59* 0.62  CALCIUM 8.2* 8.1* 8.4*  MG 1.5* 1.9  --    Liver Function Tests: No results for input(s): AST, ALT, ALKPHOS, BILITOT, PROT, ALBUMIN in the last 72 hours. No results for input(s): LIPASE, AMYLASE in the last 72 hours. CBC: No results for input(s): WBC, NEUTROABS, HGB, HCT, MCV, PLT in the last 72 hours. Cardiac Enzymes: No results for input(s): CKTOTAL, CKMB, CKMBINDEX, TROPONINI in the last 72 hours. BNP: Invalid input(s): POCBNP D-Dimer: No results for input(s): DDIMER in the last 72 hours. Hemoglobin A1C: No results for input(s): HGBA1C in the last 72 hours. Fasting Lipid Panel: No results for input(s): CHOL, HDL, LDLCALC, TRIG, CHOLHDL,  LDLDIRECT in the last 72 hours. Thyroid Function Tests: No results for input(s): TSH, T4TOTAL, T3FREE, THYROIDAB in the last 72 hours.  Invalid input(s): FREET3 Anemia Panel: No results for input(s): VITAMINB12, FOLATE, FERRITIN, TIBC, IRON, RETICCTPCT in the last 72 hours.  No results found.   Echo LVEF 60 to 65% with mild to moderate pericardial effusion without pericardial tamponade  TELEMETRY: Sinus rhythm:  ASSESSMENT AND PLAN:  Active Problems:   Chest pain   Acute respiratory failure ([place])    1.  Mild to moderate pericardial effusion, approximately 1 cm or free fluid at the apex in patient with multiple abdominal surgeries, technically challenging pericardiocentesis.  Discussed case with Dr. Delaine Lame who states that the results of pericardiocentesis will not alter due to duration of antibiotic therapy. 2.  MRSA bacteremia 3.  Paroxysmal atrial fibrillation, currently in sinus rhythm on amiodarone  Recommendations  1.  Agree with current therapy 2.  Defer pericardiocentesis 3.  Continue antibiotic therapy, duration per ID 3.  Repeat 2D echocardiogram 2 to 3 weeks  Sign off for now, please call if any questions   Isaias Cowman, MD, PhD, [facility] 04/17/2019 8:27 AM

## 2019-04-17 NOTE — Progress Notes (Signed)
Pharmacy Electrolyte Monitoring Consult:  Pharmacy consulted to assist in monitoring and replacing electrolytes in this 53 y.o. male admitted on 04/08/2019 with Shortness of Breath and Chest Pain   Labs:  Sodium (mmol/L)  Date Value  04/17/2019 134 (L)   Potassium (mmol/L)  Date Value  04/17/2019 4.0   Magnesium (mg/dL)  Date Value  04/16/2019 1.9   Phosphorus (mg/dL)  Date Value  04/09/2019 2.9   Calcium (mg/dL)  Date Value  04/17/2019 8.4 (L)   Albumin (g/dL)  Date Value  04/08/2019 3.0 (L)    Assessment/Plan: K 4.0  Scr 0.62 Patient on MagOx 400 PO daily. No other supplementation at this time  Will obtain follow up BMP with am labs.  Will replace for goal potassium ~ 4 and goal magnesium ~ 2.   Pharmacy will continue to monitor and adjust per consult.   Rowland Lathe 04/17/2019 8:16 AM

## 2019-04-18 LAB — GLUCOSE, CAPILLARY
Glucose-Capillary: 131 mg/dL — ABNORMAL HIGH (ref 70–99)
Glucose-Capillary: 164 mg/dL — ABNORMAL HIGH (ref 70–99)
Glucose-Capillary: 119 mg/dL — ABNORMAL HIGH (ref 70–99)
Glucose-Capillary: 158 mg/dL — ABNORMAL HIGH (ref 70–99)

## 2019-04-18 LAB — CULTURE, BLOOD (ROUTINE X 2)
Culture: NO GROWTH
Culture: NO GROWTH
Special Requests: ADEQUATE
Special Requests: ADEQUATE

## 2019-04-18 LAB — BASIC METABOLIC PANEL
Anion gap: 9 (ref 5–15)
BUN: 10 mg/dL (ref 6–20)
CO2: 28 mmol/L (ref 22–32)
Calcium: 7.9 mg/dL — ABNORMAL LOW (ref 8.9–10.3)
Chloride: 94 mmol/L — ABNORMAL LOW (ref 98–111)
Creatinine, Ser: 0.68 mg/dL (ref 0.61–1.24)
GFR calc Af Amer: >60 mL/min (ref >60)
GFR calc non Af Amer: >60 mL/min (ref >60)
Glucose, Bld: 171 mg/dL — ABNORMAL HIGH (ref 70–99)
Potassium: 3.7 mmol/L (ref 3.5–5.1)
Sodium: 131 mmol/L — ABNORMAL LOW (ref 135–145)

## 2019-04-18 LAB — HEMOGLOBIN A1C
Hgb A1c MFr Bld: 10.5 % — ABNORMAL HIGH (ref 4.8–5.6)
Mean Plasma Glucose: 254.65 mg/dL

## 2019-04-18 LAB — VANCOMYCIN, TROUGH: Vancomycin Tr: 16 ug/mL (ref 15–20)

## 2019-04-18 LAB — VANCOMYCIN, PEAK: Vancomycin Pk: 33 ug/mL (ref 30–40)

## 2019-04-18 LAB — MAGNESIUM: Magnesium: 1.8 mg/dL (ref 1.7–2.4)

## 2019-04-18 MED ORDER — MELOXICAM 7.5 MG PO TABS
7.5000 mg | ORAL_TABLET | Freq: Every day | ORAL | Status: DC
  Administered 2019-04-18 – 2019-04-24 (×7): 7.5 mg via ORAL
  Filled 2019-04-18 (×7): qty 1

## 2019-04-18 MED ORDER — ALTEPLASE 2 MG IJ SOLR
2.0000 mg | Freq: Once | INTRAMUSCULAR | Status: AC
  Administered 2019-04-18: 2 mg

## 2019-04-18 MED ORDER — OXYCODONE HCL 5 MG PO TABS
15.0000 mg | ORAL_TABLET | ORAL | Status: DC | PRN
  Administered 2019-04-18 – 2019-04-25 (×18): 15 mg via ORAL
  Filled 2019-04-18 (×18): qty 3

## 2019-04-18 MED ORDER — ADULT MULTIVITAMIN W/MINERALS CH
1.0000 | ORAL_TABLET | Freq: Every day | ORAL | Status: DC
  Administered 2019-04-19 – 2019-04-25 (×7): 1 via ORAL
  Filled 2019-04-18 (×7): qty 1

## 2019-04-18 MED ORDER — VANCOMYCIN HCL IN DEXTROSE 1-5 GM/200ML-% IV SOLN
1000.0000 mg | Freq: Three times a day (TID) | INTRAVENOUS | Status: DC
  Administered 2019-04-18 – 2019-04-20 (×6): 1000 mg via INTRAVENOUS
  Filled 2019-04-18 (×8): qty 200

## 2019-04-18 MED ORDER — ALTEPLASE 2 MG IJ SOLR
2.0000 mg | Freq: Once | INTRAMUSCULAR | Status: AC
  Administered 2019-04-18: 2 mg
  Filled 2019-04-18: qty 2

## 2019-04-18 MED ORDER — ALTEPLASE 2 MG IJ SOLR
2.0000 mg | Freq: Once | INTRAMUSCULAR | Status: DC
  Filled 2019-04-18: qty 2

## 2019-04-18 MED ORDER — MAGNESIUM SULFATE 2 GM/50ML IV SOLN
2.0000 g | Freq: Once | INTRAVENOUS | Status: AC
  Administered 2019-04-18: 12:00:00 2 g via INTRAVENOUS
  Filled 2019-04-18: qty 50

## 2019-04-18 MED ORDER — OXYCODONE HCL ER 15 MG PO T12A
15.0000 mg | EXTENDED_RELEASE_TABLET | Freq: Two times a day (BID) | ORAL | Status: DC
  Administered 2019-04-18 – 2019-04-25 (×14): 15 mg via ORAL
  Filled 2019-04-18 (×14): qty 1

## 2019-04-18 MED ORDER — ENSURE ENLIVE PO LIQD
237.0000 mL | Freq: Two times a day (BID) | ORAL | Status: DC
  Administered 2019-04-19 (×2): 237 mL via ORAL

## 2019-04-18 NOTE — TOC Progression Note (Signed)
Transition of Care [facility]) - Progression Note    Patient Details  Name: Kenneth Copeland MRN: [id number] Date of Birth: 1966-07-30  Transition of Care [facility]) CM/SW Contact  Ross Ludwig, [place] Phone Number: 04/18/2019, 5:47 PM  Clinical Narrative:     Patient may need IV antbiotics, however his insurance will not cover.  CSW monitoring to see if IV antibiotics will change, and patient may need SNF.   Expected Discharge Plan: [place] Barriers to Discharge: Continued Medical Work up  Expected Discharge Plan and Services Expected Discharge Plan: [place]   Discharge Planning Services: CM Consult Post Acute Care Choice: [place] arrangements for the past 2 months: Apartment Expected Discharge Date: 04/10/19                         HH Arranged: RN, IV Antibiotics HH Agency: [place] ([place]) Date HH Agency Contacted: 04/12/19 Time [place]: 1303 Representative spoke with at [place]: [email or web address]   Social Determinants of Health ([place]) Interventions    Readmission Risk Interventions Readmission Risk Prevention Plan 04/12/2019  Transportation Screening Complete  Home Care Screening Complete  Medication Review (RN CM) Complete

## 2019-04-18 NOTE — Progress Notes (Signed)
OT Cancellation Note  Patient Details Name: Kenneth Copeland MRN: [id number] DOB: 07-Jul-1966   Cancelled Treatment:    Reason Eval/Treat Not Completed: Other (comment). Order received, and chart reviewed. Upon arrival to pt room pt with VAS/IV RN entering room to administer medication. Will re-attempt at a later date/time as available and pt medically appropriate for OT tx.   Shara Blazing, M.S., OTR/L Ascom: [phone] 04/18/19, 3:14 PM

## 2019-04-18 NOTE — Progress Notes (Signed)
Pharmacy Antibiotic Note  Kenneth Copeland is a 53 y.o. male admitted on 04/08/2019 with chest pain and shortness of breath. Pharmacy has been consulted for vancomycin dosing.  Blood cultures with persistent MRSA bacteremia. TEE negative for endocarditis. ID is following the patient.  Previous Dose/assessment: Vancomycin 1250mg  mg IV Q 8 hrs. Goal AUC 400-550.  5/26 23:05 - Vancomycin dose 1250mg  q8 @ steady state  5/27 01:40 - Vancomycin peak 33 mcg/ml 5/27 07:00 - Vancomycin trough 66mcg/ml  I believe pt is closer to steady state with these lab draws, which were well timed given the dosing interval and dose timing.    Plan:  Using the Vancomycin 2 level PK calculator, the Ke is 0.1357 and t 1/2 5.1, with an estimated AUC of 618 (Cmin 15.8, Cmax 38.2)  For this reason, I am decreasing the dose to 1000mg  q 8 hours - this gives an estimated AUC of 494 (Cmin 12.2, Cmax 31.6)  Height: 5\' 11"  (180.3 cm) Weight: 182 lb 9.6 oz (82.8 kg) IBW/kg (Calculated) : 75.3  Temp (24hrs), Avg:98.7 F (37.1 C), Min:98.4 F (36.9 C), Max:99.3 F (37.4 C)  Recent Labs  Lab 04/12/19 0105 04/13/19 0602  04/14/19 0232 04/15/19 0242  04/15/19 2041 04/16/19 0149 04/16/19 1159 04/17/19 0537 04/18/19 0138 04/18/19 0140 04/18/19 0700  WBC 8.9 8.8  --   --   --   --   --   --   --   --   --   --   --   CREATININE 0.68 0.55*  --  0.58* 0.60*  --   --   --  0.59* 0.62 0.68  --   --   VANCOTROUGH  --   --    < >  --   --   --   --  13*  --   --   --   --  16  VANCOPEAK  --   --    < >  --   --    < > 18*  --   --   --   --  33  --    < > = values in this interval not displayed.    Estimated Creatinine Clearance: 117.7 mL/min (by C-G formula based on SCr of 0.68 mg/dL).    Allergies  Allergen Reactions  . Metformin And Related     Pt. States it makes his stomach bleed because of a stomach ulcer  . Motrin [Ibuprofen]     Pt. States it "inflames his stomach, causes bleeding in stomach"     Antimicrobials this admission: Vancomycin 5/19 >> Daptomycin 5/22 >>  Dose adjustments this admission: 5/20 Vanc 1750 mg q12h >> 1500 mg q12h >> 1250 mg q8H  Microbiology results: 5/18 BCx: 2/4 bottles MRSA 5/21 BCx: 2/4 bottles MRSA 5/22 BCx: NG x 5 days 5/25 BCx: NG x 2 days 5/19 UCx: no growth  5/18 MRSA PCR: negative 5/17 SARS-CoV-2: negative  Thank you for allowing pharmacy to be a part of this patient's care.  Lu Duffel, PharmD, BCPS Clinical Pharmacist 04/18/2019 8:27 AM

## 2019-04-18 NOTE — Progress Notes (Addendum)
04/18/19, 1015      tPA/Alteplase insertion via gray and white ports RA TL PICC  Maryclare Nydam, Gillermina Phy, RN

## 2019-04-18 NOTE — Progress Notes (Signed)
[name], male   DOB: Oct 05, 1966, 53 y.o.   MRN: [id number]   [facility] PROGRESS NOTE  Kenneth Copeland NLZ:[id number] DOB: 12-Mar-1966 DOA: 04/08/2019 PCP: Patient, No Pcp Per  HPI/Subjective: Patient still complains of severe pain in his left shoulder 8 out of 10 intensity comes down to 6 with pain medications.  Pain in upper left chest.  Objective: Vitals:   04/18/19 0337 04/18/19 0731  BP: 110/87 108/76  Pulse: (!) 104 (!) 102  Resp: 18   Temp: 98.4 F (36.9 C) 98.5 F (36.9 C)  SpO2: 93% 93%    Filed Weights   04/16/19 0403 04/17/19 0538 04/18/19 0337  Weight: 83.7 kg 82.3 kg 82.8 kg    ROS: Review of Systems  Constitutional: Negative for chills and fever.  Eyes: Negative for blurred vision.  Respiratory: Negative for cough and shortness of breath.   Cardiovascular: Negative for chest pain.  Gastrointestinal: Negative for abdominal pain, constipation, diarrhea, nausea and vomiting.  Genitourinary: Negative for dysuria.  Musculoskeletal: Positive for joint pain.  Neurological: Negative for dizziness and headaches.   Exam: Physical Exam  Constitutional: He is oriented to person, place, and time.  HENT:  Nose: No mucosal edema.  Mouth/Throat: No oropharyngeal exudate or posterior oropharyngeal edema.  Eyes: Pupils are equal, round, and reactive to light. Conjunctivae, EOM and lids are normal.  Neck: No JVD present. Carotid bruit is not present. No edema present. No thyroid mass and no thyromegaly present.  Cardiovascular: S1 normal and S2 normal. Tachycardia present. Exam reveals no gallop.  No murmur heard. Pulses:      Dorsalis pedis pulses are 2+ on the right side and 2+ on the left side.  Respiratory: No respiratory distress. He has decreased breath sounds in the right lower field. He has no wheezes. He has no rhonchi. He has no rales.  GI: Soft. Bowel sounds are normal. There is no abdominal tenderness.  Musculoskeletal:     Left  shoulder: He exhibits tenderness. He exhibits no swelling.  Lymphadenopathy:    He has no cervical adenopathy.  Neurological: He is alert and oriented to person, place, and time. No cranial nerve deficit.  Skin: Skin is warm. No rash noted. Nails show no clubbing.  Psychiatric: He has a normal mood and affect.      Data Reviewed: Basic Metabolic Panel: Recent Labs  Lab 04/14/19 0232 04/15/19 0242 04/16/19 1159 04/17/19 0537 04/18/19 0138  NA 136 137 135 134* 131*  K 3.5 3.7 4.1 4.0 3.7  CL 98 97* 96* 95* 94*  CO2 29 32 29 30 28   GLUCOSE 265* 212* 197* 99 171*  BUN 11 9 11 12 10   CREATININE 0.58* 0.60* 0.59* 0.62 0.68  CALCIUM 7.9* 8.2* 8.1* 8.4* 7.9*  MG  --  1.5* 1.9  --  1.8   CBC: Recent Labs  Lab 04/12/19 0105 04/13/19 0602  WBC 8.9 8.8  NEUTROABS  --  6.2  HGB 7.8* 7.5*  HCT 25.2* 24.0*  MCV 87.8 87.3  PLT 409* 373   Cardiac Enzymes: Recent Labs  Lab 04/14/19 0232  CKTOTAL 13*   BNP (last 3 results) Recent Labs    04/02/19 0909 04/08/19 0652  BNP 60.0 184.0*      CBG: Recent Labs  Lab 04/17/19 1156 04/17/19 1720 04/17/19 2123 04/18/19 0732 04/18/19 1218  GLUCAP 148* 104* 109* 131* 164*    Recent Results (from the past 240 hour(s))  MRSA PCR Screening     Status:  None   Collection Time: 04/09/19  8:48 AM  Result Value Ref Range Status   MRSA by PCR NEGATIVE NEGATIVE Final    Comment:        The GeneXpert MRSA Assay (FDA approved for NASAL specimens only), is one component of a comprehensive MRSA colonization surveillance program. It is not intended to diagnose MRSA infection nor to guide or monitor treatment for MRSA infections. Performed at [facility], [address] [place], [place] [address]   CULTURE, BLOOD (ROUTINE X 2) w Reflex to ID Panel     Status: Abnormal   Collection Time: 04/09/19  8:32 PM  Result Value Ref Range Status   Specimen Description   Final    BLOOD PORTA CATH Performed at [facility], [address] [place], [place] [address]    Special Requests   Final    BOTTLES DRAWN AEROBIC AND ANAEROBIC Blood Culture results may not be optimal due to an excessive volume of blood received in culture bottles Performed at [facility], [address] [place], [place] [address]    Culture  Setup Time   Final    GRAM POSITIVE COCCI AEROBIC BOTTLE ONLY CRITICAL RESULT CALLED TO, READ BACK BY AND VERIFIED WITH: Rito Ehrlich AT 3354 04/10/19 SDR Performed at [facility], [place] [address] [place],  [address]    Culture METHICILLIN RESISTANT STAPHYLOCOCCUS AUREUS (A)  Final   Report Status 04/12/2019 FINAL  Final   Organism ID, Bacteria METHICILLIN RESISTANT STAPHYLOCOCCUS AUREUS  Final      Susceptibility   Methicillin resistant staphylococcus aureus - MIC*    CIPROFLOXACIN >=8 RESISTANT Resistant     ERYTHROMYCIN >=8 RESISTANT Resistant     GENTAMICIN <=0.5 SENSITIVE Sensitive     OXACILLIN >=4 RESISTANT Resistant     TETRACYCLINE <=1 SENSITIVE Sensitive     VANCOMYCIN 1 SENSITIVE Sensitive     TRIMETH/SULFA 80 RESISTANT Resistant     CLINDAMYCIN >=8 RESISTANT Resistant     RIFAMPIN <=0.5 SENSITIVE Sensitive     Inducible Clindamycin NEGATIVE Sensitive     * METHICILLIN RESISTANT STAPHYLOCOCCUS AUREUS  Blood Culture ID Panel (Reflexed)     Status: Abnormal   Collection Time: 04/09/19  8:32 PM  Result Value Ref Range Status   Enterococcus species NOT DETECTED NOT DETECTED Final   Listeria monocytogenes NOT DETECTED NOT DETECTED Final   Staphylococcus species DETECTED (A) NOT DETECTED Final    Comment: CRITICAL RESULT CALLED TO, READ BACK BY AND VERIFIED WITH: WALID NAZIRI AT 1152 ON 04/10/2019 BY SDR. [facility]    Staphylococcus aureus (BCID) DETECTED (A) NOT DETECTED Final    Comment: Methicillin (oxacillin)-resistant Staphylococcus aureus (MRSA). MRSA is predictably resistant to beta-lactam antibiotics (except ceftaroline). Preferred therapy is  vancomycin unless clinically contraindicated. Patient requires contact precautions if  hospitalized. CRITICAL RESULT CALLED TO, READ BACK BY AND VERIFIED WITH: WALID NAZIRI AT 3893 ON 04/10/2019 BY SDR. [place]    Methicillin resistance DETECTED (A) NOT DETECTED Final    Comment: CRITICAL RESULT CALLED TO, READ BACK BY AND VERIFIED WITH: WALID NAZARI AT 7342 ON 04/10/2019 BY SDR. [place]    Streptococcus species NOT DETECTED NOT DETECTED Final   Streptococcus agalactiae NOT DETECTED NOT DETECTED Final   Streptococcus pneumoniae NOT DETECTED NOT DETECTED Final   Streptococcus pyogenes NOT DETECTED NOT DETECTED Final   Acinetobacter baumannii NOT DETECTED NOT DETECTED Final   Enterobacteriaceae species NOT DETECTED NOT DETECTED Final   Enterobacter cloacae complex NOT  DETECTED NOT DETECTED Final   Escherichia coli NOT DETECTED NOT DETECTED Final   Klebsiella oxytoca NOT DETECTED NOT DETECTED Final   Klebsiella pneumoniae NOT DETECTED NOT DETECTED Final   Proteus species NOT DETECTED NOT DETECTED Final   Serratia marcescens NOT DETECTED NOT DETECTED Final   Haemophilus influenzae NOT DETECTED NOT DETECTED Final   Neisseria meningitidis NOT DETECTED NOT DETECTED Final   Pseudomonas aeruginosa NOT DETECTED NOT DETECTED Final   Candida albicans NOT DETECTED NOT DETECTED Final   Candida glabrata NOT DETECTED NOT DETECTED Final   Candida krusei NOT DETECTED NOT DETECTED Final   Candida parapsilosis NOT DETECTED NOT DETECTED Final   Candida tropicalis NOT DETECTED NOT DETECTED Final    Comment: Performed at [facility], [address] [place], [place] [address]  CULTURE, BLOOD (ROUTINE X 2) w Reflex to ID Panel     Status: Abnormal   Collection Time: 04/09/19  9:02 PM  Result Value Ref Range Status   Specimen Description   Final    BLOOD LEFT HAND Performed at [facility], [address], [place], [place] [address]    Special Requests   Final    BOTTLES DRAWN AEROBIC AND  ANAEROBIC Blood Culture results may not be optimal due to an inadequate volume of blood received in culture bottles Performed at [facility], [address] [place], [place] [address]    Culture  Setup Time   Final    GRAM POSITIVE COCCI IN BOTH AEROBIC AND ANAEROBIC BOTTLES CRITICAL VALUE NOTED.  VALUE IS CONSISTENT WITH PREVIOUSLY REPORTED AND CALLED VALUE. Performed at [facility], [address] [place], [place] [address]    Culture (A)  Final    STAPHYLOCOCCUS AUREUS SUSCEPTIBILITIES PERFORMED ON PREVIOUS CULTURE WITHIN THE LAST 5 DAYS. Performed at [facility], [place] [address], [place], [place] [address]    Report Status 04/12/2019 FINAL  Final  Urine Culture     Status: None   Collection Time: 04/10/19  3:41 AM  Result Value Ref Range Status   Specimen Description   Final    URINE, RANDOM Performed at [facility], [address], [place], [place] [address]    Special Requests   Final    NONE Performed at [facility], [address] [place], [place] [address]    Culture   Final    NO GROWTH Performed at [facility], [place] [address], [place], [place] [address]    Report Status 04/11/2019 FINAL  Final  Culture, blood (Routine X 2) w Reflex to ID Panel     Status: Abnormal   Collection Time: 04/12/19  1:04 AM  Result Value Ref Range Status   Specimen Description   Final    BLOOD BLOOD LEFT FOREARM Performed at [facility], [address], [place], [place] [address]    Special Requests   Final    BOTTLES DRAWN AEROBIC ONLY Blood Culture results may not be optimal due to an inadequate volume of blood received in culture bottles Performed at [facility], [address] [place], [place] [address]    Culture  Setup Time   Final    GRAM POSITIVE COCCI AEROBIC BOTTLE ONLY CRITICAL RESULT CALLED TO, READ BACK BY AND VERIFIED WITH: JASON ROBBINS ON 04/12/2019 AT 2105 [facility] Performed at [facility], [address], [place], [place] [address]    Culture (A)  Final    STAPHYLOCOCCUS AUREUS SUSCEPTIBILITIES  PERFORMED ON PREVIOUS CULTURE WITHIN THE LAST 5 DAYS. Performed at [facility], [place] [address], [place], [place] [address]    Report Status 04/15/2019 FINAL  Final  Culture, blood (Routine X 2) w Reflex to ID Panel     Status: Abnormal   Collection Time: 04/12/19  1:04 AM  Result Value Ref Range Status   Specimen Description   Final    BLOOD BLOOD LEFT HAND Performed at [facility], [address], [place], [place] [address]    Special Requests   Final    BOTTLES DRAWN AEROBIC AND ANAEROBIC Blood Culture adequate volume Performed at [facility], [address] [place], [place] [address]    Culture  Setup Time   Final    Organism ID to follow GRAM POSITIVE COCCI AEROBIC BOTTLE ONLY CRITICAL RESULT CALLED TO, READ BACK BY AND VERIFIED WITH: JASON ROBBINS ON 04/12/2019 AT 2105 [facility] Performed at [facility], [address] [place], [place] [address]    Culture METHICILLIN RESISTANT STAPHYLOCOCCUS AUREUS (A)  Final   Report Status 04/15/2019 FINAL  Final   Organism ID, Bacteria METHICILLIN RESISTANT STAPHYLOCOCCUS AUREUS  Final      Susceptibility   Methicillin resistant staphylococcus aureus - MIC*    CIPROFLOXACIN >=8 RESISTANT Resistant     ERYTHROMYCIN >=8 RESISTANT Resistant     GENTAMICIN <=0.5 SENSITIVE Sensitive     OXACILLIN >=4 RESISTANT Resistant     TETRACYCLINE <=1 SENSITIVE Sensitive     VANCOMYCIN 1 SENSITIVE Sensitive     TRIMETH/SULFA >=320 RESISTANT Resistant     CLINDAMYCIN >=8 RESISTANT Resistant     RIFAMPIN <=0.5 SENSITIVE Sensitive     Inducible Clindamycin NEGATIVE Sensitive     * METHICILLIN RESISTANT STAPHYLOCOCCUS AUREUS  Blood Culture ID Panel (Reflexed)     Status: Abnormal   Collection Time: 04/12/19  1:04 AM  Result Value Ref Range Status   Enterococcus species NOT DETECTED NOT DETECTED  Final   Listeria monocytogenes NOT DETECTED NOT DETECTED Final   Staphylococcus species DETECTED (A) NOT DETECTED Final    Comment: CRITICAL RESULT CALLED TO, READ BACK BY AND VERIFIED WITH: CALLED JASON ROBBINS @2105  04/12/2019 [facility]    Staphylococcus aureus (BCID) DETECTED (A) NOT DETECTED Final    Comment: Methicillin (oxacillin)-resistant Staphylococcus aureus (MRSA). MRSA is predictably resistant to beta-lactam antibiotics (except ceftaroline). Preferred therapy is vancomycin unless clinically contraindicated. Patient requires contact precautions if  hospitalized. CRITICAL RESULT CALLED TO, READ BACK BY AND VERIFIED WITH: JASON ROBBINS @2105  04/12/2019 BY [facility].PM[facility]    Methicillin resistance DETECTED (A) NOT DETECTED Final    Comment: CRITICAL RESULT CALLED TO, READ BACK BY AND VERIFIED WITH: JASON ROBBINS @2105  04/12/2019 BY SAC.PMF    Streptococcus species NOT DETECTED NOT DETECTED Final   Streptococcus agalactiae NOT DETECTED NOT DETECTED Final   Streptococcus pneumoniae NOT DETECTED NOT DETECTED Final   Streptococcus pyogenes NOT DETECTED NOT DETECTED Final   Acinetobacter baumannii NOT DETECTED NOT DETECTED Final   Enterobacteriaceae species NOT DETECTED NOT DETECTED Final   Enterobacter cloacae complex NOT DETECTED NOT DETECTED Final   Escherichia coli NOT DETECTED NOT DETECTED Final   Klebsiella oxytoca NOT DETECTED NOT DETECTED Final   Klebsiella pneumoniae NOT DETECTED NOT DETECTED Final   Proteus species NOT DETECTED NOT DETECTED Final   Serratia marcescens NOT DETECTED NOT DETECTED Final   Haemophilus influenzae NOT DETECTED NOT DETECTED Final   Neisseria meningitidis NOT DETECTED NOT DETECTED Final   Pseudomonas aeruginosa NOT  DETECTED NOT DETECTED Final   Candida albicans NOT DETECTED NOT DETECTED Final   Candida glabrata NOT DETECTED NOT DETECTED Final   Candida krusei NOT DETECTED NOT DETECTED Final   Candida parapsilosis NOT DETECTED NOT DETECTED Final   Candida  tropicalis NOT DETECTED NOT DETECTED Final    Comment: Performed at [facility], [address] [place], [place] [address]  CULTURE, BLOOD (ROUTINE X 2) w Reflex to ID Panel     Status: None   Collection Time: 04/13/19  1:05 PM  Result Value Ref Range Status   Specimen Description BLOOD LEFT HAND  Final   Special Requests   Final    BOTTLES DRAWN AEROBIC AND ANAEROBIC Blood Culture adequate volume   Culture   Final    NO GROWTH 5 DAYS Performed at [facility], [address] [place], [place] [address]    Report Status 04/18/2019 FINAL  Final  CULTURE, BLOOD (ROUTINE X 2) w Reflex to ID Panel     Status: None   Collection Time: 04/13/19  1:40 PM  Result Value Ref Range Status   Specimen Description BLOOD BLOOD LEFT HAND  Final   Special Requests   Final    BOTTLES DRAWN AEROBIC AND ANAEROBIC Blood Culture adequate volume   Culture   Final    NO GROWTH 5 DAYS Performed at [facility], [address] [place], [place] [address]    Report Status 04/18/2019 FINAL  Final  CULTURE, BLOOD (ROUTINE X 2) w Reflex to ID Panel     Status: None (Preliminary result)   Collection Time: 04/16/19 11:59 AM  Result Value Ref Range Status   Specimen Description BLOOD [facility]  Final   Special Requests   Final    BOTTLES DRAWN AEROBIC AND ANAEROBIC Blood Culture adequate volume   Culture   Final    NO GROWTH 2 DAYS Performed at [facility], [address], [place], [place] [address]    Report Status PENDING  Incomplete  CULTURE, BLOOD (ROUTINE X 2) w Reflex to ID Panel     Status: None (Preliminary result)   Collection Time: 04/16/19  1:09 PM  Result Value Ref Range Status   Specimen Description BLOOD LW  Final   Special Requests   Final    BOTTLES DRAWN AEROBIC AND ANAEROBIC Blood Culture adequate volume   Culture   Final    NO GROWTH 2 DAYS Performed at [facility], [address] [place], [place] [address]    Report Status PENDING   Incomplete     Studies: Mr Shoulder Left Wo Contrast  Result Date: 04/17/2019 CLINICAL DATA:  Left shoulder pain.  Sepsis. EXAM: MRI OF THE LEFT SHOULDER WITHOUT CONTRAST TECHNIQUE: Multiplanar, multisequence MR imaging of the shoulder was performed. No intravenous contrast was administered. COMPARISON:  None. FINDINGS: Rotator cuff:  Intact without significant tendinosis. Muscles:  No focal muscular atrophy or edema. Biceps long head: Intact and normally positioned. Mild tendinosis of the intra-articular portion. Acromioclavicular Joint: The acromion is type 1. There are mild acromioclavicular degenerative changes. No significant fluid is present in the subacromial - subdeltoid bursa. Glenohumeral Joint: Mild glenohumeral degenerative changes. No significant shoulder joint effusion. Labrum: Mild superior labral degeneration without evidence of tear or paralabral cyst. Bones: No acute or significant extra-articular osseous findings. Other: There are no periarticular fluid collections or inflammatory changes. IMPRESSION: 1. No acute findings.  No evidence of septic joint or osteomyelitis. 2. The rotator cuff, biceps tendon  and labrum are intact. There is mild bicipital tendinosis and superior labral degeneration. Electronically Signed   By: Richardean Sale M.D.   On: 04/17/2019 13:22    Scheduled Meds: . amiodarone  200 mg Oral BID  . colchicine  0.6 mg Oral BID  . dicyclomine  10 mg Oral QID  . DULoxetine  30 mg Oral Daily  . enoxaparin (LOVENOX) injection  40 mg Subcutaneous Daily  . feeding supplement (ENSURE ENLIVE)  237 mL Oral BID BM  . insulin aspart  0-20 Units Subcutaneous TID WC  . insulin aspart  0-5 Units Subcutaneous QHS  . insulin aspart  5 Units Subcutaneous TID WC  . insulin glargine  40 Units Subcutaneous BID  . meloxicam  7.5 mg Oral Daily  . metoCLOPramide  10 mg Oral TID AC & HS  . [START ON 04/19/2019] multivitamin with minerals  1 tablet Oral Daily  . neomycin-polymyxin  b-dexamethasone  1 drop Left Eye Q6H  . oxyCODONE  15 mg Oral Q12H  . pantoprazole  40 mg Oral BID  . polyvinyl alcohol  2 drop Left Eye TID  . sodium chloride flush  10-40 mL Intracatheter Q12H  . sucralfate  1 g Oral QID  . tamsulosin  0.4 mg Oral Daily   Continuous Infusions: . sodium chloride Stopped (04/15/19 0507)  . vancomycin      Assessment/Plan:  1. MRSA bacteremia.  Patient seen by infectious disease. Patient on IV vancomycin.  Daptomycin was added until vancomycin levels get up in therapeutic range.  TEE negative.  Patient has a pericardial effusion and pericardial centesis would not change length of therapy so this was deferred.  Patient has a PICC line. 2. Left shoulder pain and left upper chest pain.  Shoulder pain may be referred.  MRI reviewed by orthopedic surgery and this is not the source of his infection.  Patient does have some degenerative changes. 3. Pericardial effusion.  No tamponade seen.  My associate spoke with cardiothoracic surgery at [facility] and the patient does not need a pericardial window. 4. Atrial fibrillation.  Now on oral amiodarone 5. COPD.  Continue nebulizer treatments. 6. Chronic pain and neuropathy continue gabapentin and Cymbalta and OxyContin dose increased and oxycodone increase. 7. Type 2 diabetes mellitus without complication on sliding scale and glargine insulin.  Add on a hemoglobin A1c  Code Status:     Code Status Orders  (From admission, onward)         Start     Ordered   04/08/19 1225  Full code  Continuous     04/08/19 1224        Code Status History    Date Active Date Inactive Code Status Order ID Comments User Context   04/02/2019 1332 04/05/2019 1916 Full Code [id number]  Lang Snow, NP ED      Disposition Plan: Disposition will be an issue without insurance.  Consultants:  Infectious disease  Cardiology  Orthopedic surgery  Procedures:  TEE  Antibiotics:  Vancomycin and  daptomycin  Time spent: 27 minutes  [place]

## 2019-04-18 NOTE — Progress Notes (Signed)
Initial Nutrition Assessment  DOCUMENTATION CODES:   Not applicable  INTERVENTION:   Ensure Enlive po BID, each supplement provides 350 kcal and 20 grams of protein  MVI daily   Pt likely at high refeed risk; recommend monitor K, Mg and P labs daily until stable    NUTRITION DIAGNOSIS:   Increased nutrient needs related to chronic illness(COPD, carcinoma ) as evidenced by increased estimated needs  GOAL:   Patient will meet greater than or equal to 90% of their needs  MONITOR:   PO intake, Supplement acceptance, Labs, Weight trends, I & O's, Skin  REASON FOR ASSESSMENT:   LOS    ASSESSMENT:   53 y.o.malewith a history ofpoorly controlled diabetes, pancreatitis, polysubstance abuse, 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 CT abdomen showed a pancreatic tail lesion- concerning for adenocarcinoma VS necrosis   RD working remotely.  Per chart review, pt with poor appetite and oral intake pta. RD suspects pt with poor oral intake at baseline r/t substance abuse and pt with chronic nausea/vomiting r/t gastroparesis. Per chart, pt documented to be eating 100% of meals in hospital. RD will add supplements and MVI to help pt meet his estimated needs. Per chart, pt appears fairly weight stable pta.   Medications reviewed and include: lovenox, insulin, meloxicam, reglan, oxycodone, carafate, daptomycin, Mg sulfate, vancomycin   Labs reviewed: Na 131(L), K 3.7 wnl, Mg 1.8 wnl Hgb 7.5(L), Hct 24.0(L)  Unable to complete Nutrition-Focused physical exam at this time.   Diet Order:   Diet Order            DIET SOFT Room service appropriate? Yes; Fluid consistency: Thin  Diet effective now             EDUCATION NEEDS:   No education needs have been identified at this time  Skin:  Skin  Assessment: Reviewed RN Assessment(ecchymosis )  Last BM:  5/24  Height:   Ht Readings from Last 1 Encounters:  04/12/19 5\' 11"  (1.803 m)    Weight:   Wt Readings from Last 1 Encounters:  04/18/19 82.8 kg    Ideal Body Weight:  78 kg  BMI:  Body mass index is 25.47 kg/m.  Estimated Nutritional Needs:   Kcal:  2200-2500kcal/day   Protein:  110-125g/day   Fluid:  >2.3L/day   Koleen Distance MS, RD, LDN Pager #- [phone] Office#- [phone] After Hours Pager: [phone]

## 2019-04-18 NOTE — Progress Notes (Signed)
04/18/19, 1350  tPA/Alteplase insertion via red port   Maryna Yeagle, Gillermina Phy, RN

## 2019-04-18 NOTE — Progress Notes (Signed)
04/18/19, 1240    tPA/Alteplase removal from gray and white ports RA TL PICC  Quetzalli Clos, Gillermina Phy, RN

## 2019-04-18 NOTE — Progress Notes (Signed)
04/18/19, 1555  tPA/Alteplase removal RA TL PICC red port  Kerriann Kamphuis, Gillermina Phy, RN

## 2019-04-18 NOTE — Progress Notes (Signed)
Pharmacy Electrolyte Monitoring Consult:  Pharmacy consulted to assist in monitoring and replacing electrolytes in this 53 y.o. male admitted on 04/08/2019 with Shortness of Breath and Chest Pain   Labs:  Sodium (mmol/L)  Date Value  04/18/2019 131 (L)   Potassium (mmol/L)  Date Value  04/18/2019 3.7   Magnesium (mg/dL)  Date Value  04/18/2019 1.8   Phosphorus (mg/dL)  Date Value  04/09/2019 2.9   Calcium (mg/dL)  Date Value  04/18/2019 7.9 (L)   Albumin (g/dL)  Date Value  04/08/2019 3.0 (L)    Assessment/Plan: K 3.7, Mag 1.8,  Scr 0.68  Patient on MagOx 400 PO daily.  Will give Mag 2g IV x 1   Will obtain follow up BMP and Mag with am labs.   Will replace for goal potassium ~ 4 and goal magnesium ~ 2.   Pharmacy will continue to monitor and adjust per consult.   Lu Duffel, PharmD, BCPS Clinical Pharmacist 04/18/2019 8:08 AM

## 2019-04-19 LAB — GLUCOSE, CAPILLARY
Glucose-Capillary: 85 mg/dL (ref 70–99)
Glucose-Capillary: 185 mg/dL — ABNORMAL HIGH (ref 70–99)
Glucose-Capillary: 90 mg/dL (ref 70–99)
Glucose-Capillary: 144 mg/dL — ABNORMAL HIGH (ref 70–99)

## 2019-04-19 LAB — MAGNESIUM: Magnesium: 2.3 mg/dL (ref 1.7–2.4)

## 2019-04-19 LAB — BASIC METABOLIC PANEL
Anion gap: 10 (ref 5–15)
BUN: 13 mg/dL (ref 6–20)
CO2: 28 mmol/L (ref 22–32)
Calcium: 8.3 mg/dL — ABNORMAL LOW (ref 8.9–10.3)
Chloride: 96 mmol/L — ABNORMAL LOW (ref 98–111)
Creatinine, Ser: 0.85 mg/dL (ref 0.61–1.24)
GFR calc Af Amer: >60 mL/min (ref >60)
GFR calc non Af Amer: >60 mL/min (ref >60)
Glucose, Bld: 69 mg/dL — ABNORMAL LOW (ref 70–99)
Potassium: 3.8 mmol/L (ref 3.5–5.1)
Sodium: 134 mmol/L — ABNORMAL LOW (ref 135–145)

## 2019-04-19 MED ORDER — INSULIN GLARGINE 100 UNIT/ML ~~LOC~~ SOLN
35.0000 [IU] | Freq: Two times a day (BID) | SUBCUTANEOUS | Status: DC
  Administered 2019-04-19 (×2): 35 [IU] via SUBCUTANEOUS
  Filled 2019-04-19 (×4): qty 0.35

## 2019-04-19 NOTE — NC FL2 (Signed)
[place] LEVEL OF CARE SCREENING TOOL     IDENTIFICATION  Patient Name: Kenneth Copeland Birthdate: July 03, 1966 Sex: male Admission Date (Current Location): 04/08/2019  [place] and [place] Number:  Engineering geologist and Address:  [facility], [address] [place], [place] [address]      Provider Number: [id number]  Attending Physician Name and Address:  Loletha Grayer, MD  Relative Name and Phone Number:  Jonavon, Trieu   [phone] or Raj Janus Significant other   [phone]     Current Level of Care: Hospital Recommended Level of Care: [place] Prior Approval Number:    Date Approved/Denied:   PASRR Number: [id number]  Discharge Plan: SNF    Current Diagnoses: Patient Active Problem List   Diagnosis Date Noted  . Acute respiratory failure ([place])   . Chest pain 04/08/2019  . Pericardial effusion 04/02/2019    Orientation RESPIRATION BLADDER Height & Weight     Self, Time, Situation, Place  Normal Continent Weight: 182 lb 9.6 oz (82.8 kg) Height:  5\' 11"  (180.3 cm)  BEHAVIORAL SYMPTOMS/MOOD NEUROLOGICAL BOWEL NUTRITION STATUS      Continent Diet(Mechanical Soft)  AMBULATORY STATUS COMMUNICATION OF NEEDS Skin   Supervision Verbally Normal                       Personal Care Assistance Level of Assistance  Bathing, Feeding, Dressing Bathing Assistance: Limited assistance Feeding assistance: Independent Dressing Assistance: Limited assistance     Functional Limitations Info  Sight, Hearing, Speech Sight Info: Adequate Hearing Info: Adequate Speech Info: Adequate    SPECIAL CARE FACTORS FREQUENCY  PT (By licensed PT)     PT Frequency: minimum 2x a week              Contractures Contractures Info: Not present    Additional Factors Info  Code Status, Allergies, Insulin Sliding Scale, Isolation Precautions Code Status Info: Full Code Allergies Info: METFORMIN AND  RELATED, MOTRIN IBUPROFEN    Insulin Sliding Scale Info: insulin aspart (novoLOG) injection 0-20 Units minimum 5x a day with therapy Isolation Precautions Info: MRSA contact precautions     Current Medications (04/19/2019):  This is the current hospital active medication list Current Facility-Administered Medications  Medication Dose Route Frequency Provider Last Rate Last Dose  . 0.9 %  sodium chloride infusion   Intravenous PRN Hillary Bow, MD   Stopped at 04/15/19 0507  . acetaminophen (TYLENOL) tablet 650 mg  650 mg Oral Q6H PRN Henreitta Leber, MD   650 mg at 04/12/19 0010   Or  . acetaminophen (TYLENOL) suppository 650 mg  650 mg Rectal Q6H PRN Henreitta Leber, MD      . amiodarone (PACERONE) tablet 200 mg  200 mg Oral BID Callwood, Dwayne D, MD   200 mg at 04/18/19 2103  . artificial tears (LACRILUBE) ophthalmic ointment 1 application  1 application Left Eye BID PRN Henreitta Leber, MD      . colchicine tablet 0.6 mg  0.6 mg Oral BID Henreitta Leber, MD   0.6 mg at 04/18/19 2103  . dicyclomine (BENTYL) capsule 10 mg  10 mg Oral QID Henreitta Leber, MD   10 mg at 04/18/19 2104  . docusate sodium (COLACE) capsule 100 mg  100 mg Oral Daily PRN Flora Lipps, MD   100 mg at 04/11/19 1717  . DULoxetine (CYMBALTA) DR capsule 30 mg  30 mg Oral Daily Sainani, Vivek J,  MD   30 mg at 04/18/19 1110  . enoxaparin (LOVENOX) injection 40 mg  40 mg Subcutaneous Daily Flora Lipps, MD   40 mg at 04/18/19 1111  . feeding supplement (ENSURE ENLIVE) (ENSURE ENLIVE) liquid 237 mL  237 mL Oral BID BM Wieting, Richard, MD      . insulin aspart (novoLOG) injection 0-20 Units  0-20 Units Subcutaneous TID WC Flora Lipps, MD   4 Units at 04/18/19 1300  . insulin aspart (novoLOG) injection 0-5 Units  0-5 Units Subcutaneous QHS Flora Lipps, MD   2 Units at 04/14/19 2036  . insulin aspart (novoLOG) injection 5 Units  5 Units Subcutaneous TID WC Flora Lipps, MD   5 Units at 04/18/19 1711  . insulin  glargine (LANTUS) injection 35 Units  35 Units Subcutaneous BID Wieting, Richard, MD      . ipratropium-albuterol (DUONEB) 0.5-2.5 (3) MG/3ML nebulizer solution 3 mL  3 mL Nebulization Q6H PRN Kasa, Kurian, MD      . lidocaine (XYLOCAINE) 2 % injection 0-20 mL  0-20 mL Intradermal Once PRN Callwood, Dwayne D, MD      . meloxicam (MOBIC) tablet 7.5 mg  7.5 mg Oral Daily Loletha Grayer, MD   7.5 mg at 04/18/19 1300  . metoCLOPramide (REGLAN) tablet 10 mg  10 mg Oral TID AC & HS Henreitta Leber, MD   10 mg at 04/18/19 2103  . morphine 2 MG/ML injection 2 mg  2 mg Intravenous Q4H PRN Darel Hong D, NP   2 mg at 04/19/19 0458  . multivitamin with minerals tablet 1 tablet  1 tablet Oral Daily Wieting, Richard, MD      . neomycin-polymyxin b-dexamethasone (MAXITROL) ophthalmic suspension 1 drop  1 drop Left Eye Q6H Henreitta Leber, MD   1 drop at 04/19/19 0724  . ondansetron (ZOFRAN) tablet 4 mg  4 mg Oral Q6H PRN Henreitta Leber, MD       Or  . ondansetron (ZOFRAN) injection 4 mg  4 mg Intravenous Q6H PRN Henreitta Leber, MD      . oxyCODONE (Oxy IR/ROXICODONE) immediate release tablet 15 mg  15 mg Oral Q4H PRN Loletha Grayer, MD   15 mg at 04/18/19 1705  . oxyCODONE (OXYCONTIN) 12 hr tablet 15 mg  15 mg Oral Q12H Loletha Grayer, MD   15 mg at 04/18/19 2103  . pantoprazole (PROTONIX) EC tablet 40 mg  40 mg Oral BID Henreitta Leber, MD   40 mg at 04/18/19 2103  . polyvinyl alcohol (LIQUIFILM TEARS) 1.4 % ophthalmic solution 2 drop  2 drop Left Eye TID Henreitta Leber, MD   2 drop at 04/18/19 2105  . promethazine (PHENERGAN) injection 12.5 mg  12.5 mg Intravenous Q6H PRN Merlyn Lot, MD   12.5 mg at 04/08/19 0734  . sodium chloride flush (NS) 0.9 % injection 10-40 mL  10-40 mL Intracatheter PRN Henreitta Leber, MD      . sodium chloride flush (NS) 0.9 % injection 10-40 mL  10-40 mL Intracatheter Q12H Henreitta Leber, MD   10 mL at 04/18/19 2105  . sodium chloride flush (NS) 0.9 %  injection 10-40 mL  10-40 mL Intracatheter PRN Henreitta Leber, MD      . sucralfate (CARAFATE) tablet 1 g  1 g Oral QID Henreitta Leber, MD   1 g at 04/18/19 2103  . tamsulosin (FLOMAX) capsule 0.4 mg  0.4 mg Oral Daily Sainani, Belia Heman, MD  0.4 mg at 04/18/19 1110  . vancomycin (VANCOCIN) IVPB 1000 mg/200 mL premix  1,000 mg Intravenous Q8H Lu Duffel, RPH 200 mL/hr at 04/19/19 0724 1,000 mg at 04/19/19 7412     Discharge Medications: Please see discharge summary for a list of discharge medications.  Relevant Imaging Results:  Relevant Lab Results:   Additional Information SSN [id number]  Ross Ludwig, LCSW

## 2019-04-19 NOTE — Progress Notes (Addendum)
Pharmacy Electrolyte Monitoring Consult:  Pharmacy consulted to assist in monitoring and replacing electrolytes in this 53 y.o. male admitted on 04/08/2019 with Shortness of Breath and Chest Pain   Labs:  Sodium (mmol/L)  Date Value  04/19/2019 134 (L)   Potassium (mmol/L)  Date Value  04/19/2019 3.8   Magnesium (mg/dL)  Date Value  04/19/2019 2.3   Phosphorus (mg/dL)  Date Value  04/09/2019 2.9   Calcium (mg/dL)  Date Value  04/19/2019 8.3 (L)   Albumin (g/dL)  Date Value  04/08/2019 3.0 (L)    Assessment/Plan: K 3.8, Mag 2.3,  Scr 0.85  Patient on MagOx 400 PO daily.  No additional replenishment needed today.  Will obtain follow up BMP and Mag with am labs.   Will replace for goal potassium ~ 4 and goal magnesium ~ 2.   Pharmacy will check potassium/mag one more day and likely sign off if patient is stable   Lu Duffel, PharmD, BCPS Clinical Pharmacist 04/19/2019 7:20 AM

## 2019-04-19 NOTE — Progress Notes (Signed)
OT Cancellation Note  Patient Details Name: Kenneth Copeland MRN: [id number] DOB: 1966/10/10   Cancelled Treatment:    Reason Eval/Treat Not Completed: Pain limiting ability to participate Order received and chart reviewed. Pt was recently admitted to [facility] and went home and was re-admitted. Pt lying in bed in fetal position stating he has 9/10 pain in abdomen and refused to do anything.  Will attempt again tomorrow.  Thank you for the referral.  Chrys Racer, OTR/L ascom [phone] 04/19/19, 1:44 PM

## 2019-04-19 NOTE — TOC Progression Note (Signed)
Transition of Care [facility]) - Progression Note    Patient Details  Name: Deron Poole MRN: [id number] Date of Birth: 1966-05-12  [facility]) CM/SW Contact  Cecil Cobbs Phone Number: 04/19/2019, 6:23 PM  Clinical Narrative:     CSW contacted patient's insurance company, patient insurance will end on Apr 22, 2019.  Patient has a medicare advantage plan HMO, however there are not any facilities in [place], [place], or [place] per insurance company.  Patient stated he has a [employer] which he has signed up for, but it is not active yet.  CSW continuing to follow patient's progress throughout discharge planning.   Expected Discharge Plan: [place] Barriers to Discharge: Continued Medical Work up  Expected Discharge Plan and Services Expected Discharge Plan: [place]   Discharge Planning Services: CM Consult Post Acute Care Choice: [place] arrangements for the past 2 months: Apartment Expected Discharge Date: 04/10/19                         HH Arranged: RN, IV Antibiotics HH Agency: [place] ([place]) Date HH Agency Contacted: 04/12/19 Time [place]: 1303 Representative spoke with at [place]: [email or web address]   Social Determinants of Health ([place]) Interventions    Readmission Risk Interventions Readmission Risk Prevention Plan 04/12/2019  Transportation Screening Complete  Home Care Screening Complete  Medication Review (RN CM) Complete

## 2019-04-19 NOTE — Progress Notes (Signed)
[name], male   DOB: November 17, 1966, 53 y.o.   MRN: [id number]   [employer] PROGRESS NOTE  Chasin Findling CXK:[id number] DOB: [birth year] DOA: 04/08/2019 PCP: Patient, No Pcp Per  HPI/Subjective: Patient states he was taking chronic pain medications prior to coming in from the last discharge.  Still having chest and left shoulder pain.  Pain is a little bit better than yesterday 6 out of 10 in intensity.  Objective: Vitals:   04/19/19 0328 04/19/19 0730  BP: 92/63 98/69  Pulse: (!) 102 98  Resp:  20  Temp: 98.1 F (36.7 C) 98.1 F (36.7 C)  SpO2: 94% 93%    Filed Weights   04/16/19 0403 04/17/19 0538 04/18/19 0337  Weight: 83.7 kg 82.3 kg 82.8 kg    ROS: Review of Systems  Constitutional: Negative for chills and fever.  Eyes: Negative for blurred vision.  Respiratory: Negative for cough and shortness of breath.   Cardiovascular: Negative for chest pain.  Gastrointestinal: Negative for abdominal pain, constipation, diarrhea, nausea and vomiting.  Genitourinary: Negative for dysuria.  Musculoskeletal: Positive for joint pain.  Neurological: Negative for dizziness and headaches.   Exam: Physical Exam  Constitutional: He is oriented to person, place, and time.  HENT:  Nose: No mucosal edema.  Mouth/Throat: No oropharyngeal exudate or posterior oropharyngeal edema.  Eyes: Pupils are equal, round, and reactive to light. Conjunctivae, EOM and lids are normal.  Neck: No JVD present. Carotid bruit is not present. No edema present. No thyroid mass and no thyromegaly present.  Cardiovascular: Regular rhythm, S1 normal and S2 normal. Exam reveals no gallop.  No murmur heard. Pulses:      Dorsalis pedis pulses are 2+ on the right side and 2+ on the left side.  Respiratory: No respiratory distress. He has decreased breath sounds in the right lower field. He has no wheezes. He has no rhonchi. He has no rales.  GI: Soft. Bowel sounds are normal. There is no  abdominal tenderness.  Musculoskeletal:     Left shoulder: He exhibits tenderness. He exhibits no swelling.  Lymphadenopathy:    He has no cervical adenopathy.  Neurological: He is alert and oriented to person, place, and time. No cranial nerve deficit.  Skin: Skin is warm. No rash noted. Nails show no clubbing.  Psychiatric: He has a normal mood and affect.      Data Reviewed: Basic Metabolic Panel: Recent Labs  Lab 04/15/19 0242 04/16/19 1159 04/17/19 0537 04/18/19 0138 04/19/19 0457  NA 137 135 134* 131* 134*  K 3.7 4.1 4.0 3.7 3.8  CL 97* 96* 95* 94* 96*  CO2 32 29 30 28 28   GLUCOSE 212* 197* 99 171* 69*  BUN 9 11 12 10 13   CREATININE 0.60* 0.59* 0.62 0.68 0.85  CALCIUM 8.2* 8.1* 8.4* 7.9* 8.3*  MG 1.5* 1.9  --  1.8 2.3   CBC: Recent Labs  Lab 04/13/19 0602  WBC 8.8  NEUTROABS 6.2  HGB 7.5*  HCT 24.0*  MCV 87.3  PLT 373   Cardiac Enzymes: Recent Labs  Lab 04/14/19 0232  CKTOTAL 13*   BNP (last 3 results) Recent Labs    04/02/19 0909 04/08/19 0652  BNP 60.0 184.0*      CBG: Recent Labs  Lab 04/18/19 1218 04/18/19 1738 04/18/19 2101 04/19/19 0733 04/19/19 1209  GLUCAP 164* 119* 158* 85 185*    Recent Results (from the past 240 hour(s))  CULTURE, BLOOD (ROUTINE X 2) w Reflex to ID Panel  Status: Abnormal   Collection Time: 04/09/19  8:32 PM  Result Value Ref Range Status   Specimen Description   Final    BLOOD PORTA CATH Performed at [facility], [address] [place], [place] [address]    Special Requests   Final    BOTTLES DRAWN AEROBIC AND ANAEROBIC Blood Culture results may not be optimal due to an excessive volume of blood received in culture bottles Performed at [facility], [address] [place], [place] [address]    Culture  Setup Time   Final    GRAM POSITIVE COCCI AEROBIC BOTTLE ONLY CRITICAL RESULT CALLED TO, READ BACK BY AND VERIFIED WITH: Rito Ehrlich AT 2979 04/10/19 SDR Performed at [facility], [place] [address], [place], [place] [address]    Culture METHICILLIN RESISTANT STAPHYLOCOCCUS AUREUS (A)  Final   Report Status 04/12/2019 FINAL  Final   Organism ID, Bacteria METHICILLIN RESISTANT STAPHYLOCOCCUS AUREUS  Final      Susceptibility   Methicillin resistant staphylococcus aureus - MIC*    CIPROFLOXACIN >=8 RESISTANT Resistant     ERYTHROMYCIN >=8 RESISTANT Resistant     GENTAMICIN <=0.5 SENSITIVE Sensitive     OXACILLIN >=4 RESISTANT Resistant     TETRACYCLINE <=1 SENSITIVE Sensitive     VANCOMYCIN 1 SENSITIVE Sensitive     TRIMETH/SULFA 80 RESISTANT Resistant     CLINDAMYCIN >=8 RESISTANT Resistant     RIFAMPIN <=0.5 SENSITIVE Sensitive     Inducible Clindamycin NEGATIVE Sensitive     * METHICILLIN RESISTANT STAPHYLOCOCCUS AUREUS  Blood Culture ID Panel (Reflexed)     Status: Abnormal   Collection Time: 04/09/19  8:32 PM  Result Value Ref Range Status   Enterococcus species NOT DETECTED NOT DETECTED Final   Listeria monocytogenes NOT DETECTED NOT DETECTED Final   Staphylococcus species DETECTED (A) NOT DETECTED Final    Comment: CRITICAL RESULT CALLED TO, READ BACK BY AND VERIFIED WITH: WALID NAZIRI AT 1152 ON 04/10/2019 BY SDR. [facility]    Staphylococcus aureus (BCID) DETECTED (A) NOT DETECTED Final    Comment: Methicillin (oxacillin)-resistant Staphylococcus aureus (MRSA). MRSA is predictably resistant to beta-lactam antibiotics (except ceftaroline). Preferred therapy is vancomycin unless clinically contraindicated. Patient requires contact precautions if  hospitalized. CRITICAL RESULT CALLED TO, READ BACK BY AND VERIFIED WITH: WALID NAZIRI AT 9417 ON 04/10/2019 BY SDR. [place]    Methicillin resistance DETECTED (A) NOT DETECTED Final    Comment: CRITICAL RESULT CALLED TO, READ BACK BY AND VERIFIED WITH: WALID NAZARI AT 4081 ON 04/10/2019 BY SDR. [place]    Streptococcus species NOT DETECTED NOT DETECTED Final   Streptococcus agalactiae NOT DETECTED NOT DETECTED Final    Streptococcus pneumoniae NOT DETECTED NOT DETECTED Final   Streptococcus pyogenes NOT DETECTED NOT DETECTED Final   Acinetobacter baumannii NOT DETECTED NOT DETECTED Final   Enterobacteriaceae species NOT DETECTED NOT DETECTED Final   Enterobacter cloacae complex NOT DETECTED NOT DETECTED Final   Escherichia coli NOT DETECTED NOT DETECTED Final   Klebsiella oxytoca NOT DETECTED NOT DETECTED Final   Klebsiella pneumoniae NOT DETECTED NOT DETECTED Final   Proteus species NOT DETECTED NOT DETECTED Final   Serratia marcescens NOT DETECTED NOT DETECTED Final   Haemophilus influenzae NOT DETECTED NOT DETECTED Final   Neisseria meningitidis NOT DETECTED NOT DETECTED Final   Pseudomonas aeruginosa NOT DETECTED NOT DETECTED Final   Candida albicans NOT DETECTED NOT DETECTED Final   Candida glabrata NOT DETECTED NOT DETECTED Final   Candida krusei NOT  DETECTED NOT DETECTED Final   Candida parapsilosis NOT DETECTED NOT DETECTED Final   Candida tropicalis NOT DETECTED NOT DETECTED Final    Comment: Performed at [facility], [address] [place], [place] [address]  CULTURE, BLOOD (ROUTINE X 2) w Reflex to ID Panel     Status: Abnormal   Collection Time: 04/09/19  9:02 PM  Result Value Ref Range Status   Specimen Description   Final    BLOOD LEFT HAND Performed at [facility], [address], [place], [place] [address]    Special Requests   Final    BOTTLES DRAWN AEROBIC AND ANAEROBIC Blood Culture results may not be optimal due to an inadequate volume of blood received in culture bottles Performed at [facility], [address], [place], [place] [address]    Culture  Setup Time   Final    GRAM POSITIVE COCCI IN BOTH AEROBIC AND ANAEROBIC BOTTLES CRITICAL VALUE NOTED.  VALUE IS CONSISTENT WITH PREVIOUSLY REPORTED AND CALLED VALUE. Performed at [facility], [address] [place], [place] [address]    Culture (A)  Final    STAPHYLOCOCCUS  AUREUS SUSCEPTIBILITIES PERFORMED ON PREVIOUS CULTURE WITHIN THE LAST 5 DAYS. Performed at [facility], [place] [address], [place], [place] [address]    Report Status 04/12/2019 FINAL  Final  Urine Culture     Status: None   Collection Time: 04/10/19  3:41 AM  Result Value Ref Range Status   Specimen Description   Final    URINE, RANDOM Performed at [facility], [address], [place], [place] [address]    Special Requests   Final    NONE Performed at [facility], [address], [place], [place] [address]    Culture   Final    NO GROWTH Performed at [facility], [place] [address], [place], [place] [address]    Report Status 04/11/2019 FINAL  Final  Culture, blood (Routine X 2) w Reflex to ID Panel     Status: Abnormal   Collection Time: 04/12/19  1:04 AM  Result Value Ref Range Status   Specimen Description   Final    BLOOD BLOOD LEFT FOREARM Performed at [facility], [address] [place], [place] [address]    Special Requests   Final    BOTTLES DRAWN AEROBIC ONLY Blood Culture results may not be optimal due to an inadequate volume of blood received in culture bottles Performed at [facility], [address] [place], [place] [address]    Culture  Setup Time   Final    GRAM POSITIVE COCCI AEROBIC BOTTLE ONLY CRITICAL RESULT CALLED TO, READ BACK BY AND VERIFIED WITH: JASON ROBBINS ON 04/12/2019 AT 2105 [facility] Performed at [facility], [address] [place], [place] [address]    Culture (A)  Final    STAPHYLOCOCCUS AUREUS SUSCEPTIBILITIES PERFORMED ON PREVIOUS CULTURE WITHIN THE LAST 5 DAYS. Performed at [facility], [place] [address] [place], [place] [address]    Report Status 04/15/2019 FINAL  Final  Culture, blood (Routine X 2) w Reflex to ID Panel     Status: Abnormal   Collection Time: 04/12/19  1:04 AM  Result Value Ref Range Status   Specimen Description   Final    BLOOD BLOOD LEFT  HAND Performed at [facility], [address] [place],  [address]    Special Requests   Final  BOTTLES DRAWN AEROBIC AND ANAEROBIC Blood Culture adequate volume Performed at [facility], [address] [place], [place] [address]    Culture  Setup Time   Final    Organism ID to follow GRAM POSITIVE COCCI AEROBIC BOTTLE ONLY CRITICAL RESULT CALLED TO, READ BACK BY AND VERIFIED WITH: JASON ROBBINS ON 04/12/2019 AT 2105 [facility] Performed at [facility], [place] [place], [place] [address]    Culture METHICILLIN RESISTANT STAPHYLOCOCCUS AUREUS (A)  Final   Report Status 04/15/2019 FINAL  Final   Organism ID, Bacteria METHICILLIN RESISTANT STAPHYLOCOCCUS AUREUS  Final      Susceptibility   Methicillin resistant staphylococcus aureus - MIC*    CIPROFLOXACIN >=8 RESISTANT Resistant     ERYTHROMYCIN >=8 RESISTANT Resistant     GENTAMICIN <=0.5 SENSITIVE Sensitive     OXACILLIN >=4 RESISTANT Resistant     TETRACYCLINE <=1 SENSITIVE Sensitive     VANCOMYCIN 1 SENSITIVE Sensitive     TRIMETH/SULFA >=320 RESISTANT Resistant     CLINDAMYCIN >=8 RESISTANT Resistant     RIFAMPIN <=0.5 SENSITIVE Sensitive     Inducible Clindamycin NEGATIVE Sensitive     * METHICILLIN RESISTANT STAPHYLOCOCCUS AUREUS  Blood Culture ID Panel (Reflexed)     Status: Abnormal   Collection Time: 04/12/19  1:04 AM  Result Value Ref Range Status   Enterococcus species NOT DETECTED NOT DETECTED Final   Listeria monocytogenes NOT DETECTED NOT DETECTED Final   Staphylococcus species DETECTED (A) NOT DETECTED Final    Comment: CRITICAL RESULT CALLED TO, READ BACK BY AND VERIFIED WITH: CALLED JASON ROBBINS @2105  04/12/2019 [facility]    Staphylococcus aureus (BCID) DETECTED (A) NOT DETECTED Final    Comment: Methicillin (oxacillin)-resistant Staphylococcus aureus (MRSA). MRSA is predictably resistant to beta-lactam antibiotics (except ceftaroline). Preferred therapy is vancomycin unless  clinically contraindicated. Patient requires contact precautions if  hospitalized. CRITICAL RESULT CALLED TO, READ BACK BY AND VERIFIED WITH: JASON ROBBINS @2105  04/12/2019 BY [facility]    Methicillin resistance DETECTED (A) NOT DETECTED Final    Comment: CRITICAL RESULT CALLED TO, READ BACK BY AND VERIFIED WITH: JASON ROBBINS @2105  04/12/2019 BY SAC.PMF    Streptococcus species NOT DETECTED NOT DETECTED Final   Streptococcus agalactiae NOT DETECTED NOT DETECTED Final   Streptococcus pneumoniae NOT DETECTED NOT DETECTED Final   Streptococcus pyogenes NOT DETECTED NOT DETECTED Final   Acinetobacter baumannii NOT DETECTED NOT DETECTED Final   Enterobacteriaceae species NOT DETECTED NOT DETECTED Final   Enterobacter cloacae complex NOT DETECTED NOT DETECTED Final   Escherichia coli NOT DETECTED NOT DETECTED Final   Klebsiella oxytoca NOT DETECTED NOT DETECTED Final   Klebsiella pneumoniae NOT DETECTED NOT DETECTED Final   Proteus species NOT DETECTED NOT DETECTED Final   Serratia marcescens NOT DETECTED NOT DETECTED Final   Haemophilus influenzae NOT DETECTED NOT DETECTED Final   Neisseria meningitidis NOT DETECTED NOT DETECTED Final   Pseudomonas aeruginosa NOT DETECTED NOT DETECTED Final   Candida albicans NOT DETECTED NOT DETECTED Final   Candida glabrata NOT DETECTED NOT DETECTED Final   Candida krusei NOT DETECTED NOT DETECTED Final   Candida parapsilosis NOT DETECTED NOT DETECTED Final   Candida tropicalis NOT DETECTED NOT DETECTED Final    Comment: Performed at [facility], [address] [place], [place] [address]  CULTURE, BLOOD (ROUTINE X 2) w Reflex to ID Panel     Status: None   Collection Time: 04/13/19  1:05 PM  Result Value Ref Range Status   Specimen  Description BLOOD LEFT HAND  Final   Special Requests   Final    BOTTLES DRAWN AEROBIC AND ANAEROBIC Blood Culture adequate volume   Culture   Final    NO GROWTH 5 DAYS Performed at [facility], [address] [place], [place] [address]    Report Status 04/18/2019 FINAL  Final  CULTURE, BLOOD (ROUTINE X 2) w Reflex to ID Panel     Status: None   Collection Time: 04/13/19  1:40 PM  Result Value Ref Range Status   Specimen Description BLOOD BLOOD LEFT HAND  Final   Special Requests   Final    BOTTLES DRAWN AEROBIC AND ANAEROBIC Blood Culture adequate volume   Culture   Final    NO GROWTH 5 DAYS Performed at [facility], [address] [place], [place] [address]    Report Status 04/18/2019 FINAL  Final  CULTURE, BLOOD (ROUTINE X 2) w Reflex to ID Panel     Status: None (Preliminary result)   Collection Time: 04/16/19 11:59 AM  Result Value Ref Range Status   Specimen Description BLOOD [facility]  Final   Special Requests   Final    BOTTLES DRAWN AEROBIC AND ANAEROBIC Blood Culture adequate volume   Culture   Final    NO GROWTH 3 DAYS Performed at [facility], [address], [place], [place] [address]    Report Status PENDING  Incomplete  CULTURE, BLOOD (ROUTINE X 2) w Reflex to ID Panel     Status: None (Preliminary result)   Collection Time: 04/16/19  1:09 PM  Result Value Ref Range Status   Specimen Description BLOOD LW  Final   Special Requests   Final    BOTTLES DRAWN AEROBIC AND ANAEROBIC Blood Culture adequate volume   Culture   Final    NO GROWTH 3 DAYS Performed at [facility], [address] [place], [place] [address]    Report Status PENDING  Incomplete     Studies: No results found.  Scheduled Meds: . amiodarone  200 mg Oral BID  . colchicine  0.6 mg Oral BID  . dicyclomine  10 mg Oral QID  . DULoxetine  30 mg Oral Daily  . enoxaparin (LOVENOX) injection  40 mg Subcutaneous Daily  . feeding supplement (ENSURE ENLIVE)  237 mL Oral BID BM  . insulin aspart  0-20 Units Subcutaneous TID WC  . insulin aspart  0-5 Units Subcutaneous QHS  . insulin aspart  5 Units Subcutaneous TID WC  . insulin glargine  35 Units Subcutaneous BID   . meloxicam  7.5 mg Oral Daily  . metoCLOPramide  10 mg Oral TID AC & HS  . multivitamin with minerals  1 tablet Oral Daily  . neomycin-polymyxin b-dexamethasone  1 drop Left Eye Q6H  . oxyCODONE  15 mg Oral Q12H  . pantoprazole  40 mg Oral BID  . polyvinyl alcohol  2 drop Left Eye TID  . sodium chloride flush  10-40 mL Intracatheter Q12H  . sucralfate  1 g Oral QID  . tamsulosin  0.4 mg Oral Daily   Continuous Infusions: . sodium chloride Stopped (04/15/19 0507)  . vancomycin 1,000 mg (04/19/19 0724)    Assessment/Plan:  1. MRSA bacteremia.  Patient seen by infectious disease. Patient on IV vancomycin.  Last positive blood culture 04/12/2019.  TEE negative.  Patient has a pericardial effusion and pericardial centesis would not change length of therapy so this was deferred.  Patient has a  PICC line.  Social worker and care management team looking into options about disposition.  Potential rehab for continued antibiotic therapy. 2. Left shoulder pain and left upper chest pain.  Shoulder pain may be referred.  Patient does have some degenerative changes. 3. Pericardial effusion.  No tamponade seen.  My associate spoke with cardiothoracic surgery at [facility] and the patient does not need a pericardial window. 4. Atrial fibrillation.  Now on oral amiodarone 5. COPD.  Continue nebulizer treatments. 6. Chronic pain and neuropathy continue gabapentin and Cymbalta and OxyContin dose increased and oxycodone increase. 7. Type 2 diabetes mellitus without complication on sliding scale and glargine insulin.  Patient sugar was on the lower side this morning so I cut back glargine insulin to 35 units twice daily.  Hemoglobin A1c elevated at 10.5.  Code Status:     Code Status Orders  (From admission, onward)         Start     Ordered   04/08/19 1225  Full code  Continuous     04/08/19 1224        Code Status History    Date Active Date Inactive Code Status Order ID Comments User Context    04/02/2019 1332 04/05/2019 1916 Full Code [id number]  Lang Snow, NP ED      Disposition Plan: Disposition will be an issue without insurance.  Consultants:  Infectious disease  Cardiology  Orthopedic surgery  Procedures:  TEE  Antibiotics:  Vancomycin  Time spent: 26 minutes  [place]

## 2019-04-19 NOTE — Progress Notes (Signed)
SNF Benefits:  Number called: [id number] Rep: Anderson [place] Reference Number: [id number]  [employer] HMO plan effective as of 11/22/2018.  Scheduled to term on 04/22/2019. No deductible.  In-network out of pocket max is $6,700, of which $2,067.68 met so far. Medicare Number: [id number].   [facility] plan number: [id number].  In-network SNF: $0 copay for days 1-20 and a $160 daily copay for days 21-100. Three day hospital stay not required.  Limited to 100 days each benefit period.  Auth required - details below.     Out-of-network SNF: [place] considered out of network for plan.  Must submit prior authorization for exception approval.  If approved, would be at highest in-network rate.    For Approval: must complete and submit Prior Authorization/Utilization Management form found at [email or web address]  Completed form must be faxed to [phone].

## 2019-04-20 DIAGNOSIS — C220 Liver cell carcinoma: Secondary | ICD-10-CM

## 2019-04-20 LAB — GLUCOSE, CAPILLARY
Glucose-Capillary: 94 mg/dL (ref 70–99)
Glucose-Capillary: 142 mg/dL — ABNORMAL HIGH (ref 70–99)
Glucose-Capillary: 136 mg/dL — ABNORMAL HIGH (ref 70–99)
Glucose-Capillary: 203 mg/dL — ABNORMAL HIGH (ref 70–99)

## 2019-04-20 LAB — VANCOMYCIN, PEAK: Vancomycin Pk: 56 ug/mL (ref 30–40)

## 2019-04-20 LAB — BASIC METABOLIC PANEL
Anion gap: 10 (ref 5–15)
BUN: 12 mg/dL (ref 6–20)
CO2: 28 mmol/L (ref 22–32)
Calcium: 8.2 mg/dL — ABNORMAL LOW (ref 8.9–10.3)
Chloride: 95 mmol/L — ABNORMAL LOW (ref 98–111)
Creatinine, Ser: 1.15 mg/dL (ref 0.61–1.24)
GFR calc Af Amer: >60 mL/min (ref >60)
GFR calc non Af Amer: >60 mL/min (ref >60)
Glucose, Bld: 100 mg/dL — ABNORMAL HIGH (ref 70–99)
Potassium: 3.8 mmol/L (ref 3.5–5.1)
Sodium: 133 mmol/L — ABNORMAL LOW (ref 135–145)

## 2019-04-20 LAB — MAGNESIUM: Magnesium: 2 mg/dL (ref 1.7–2.4)

## 2019-04-20 LAB — VANCOMYCIN, TROUGH: Vancomycin Tr: 31 ug/mL (ref 15–20)

## 2019-04-20 MED ORDER — INSULIN GLARGINE 100 UNIT/ML ~~LOC~~ SOLN
20.0000 [IU] | Freq: Two times a day (BID) | SUBCUTANEOUS | Status: DC
  Administered 2019-04-20 – 2019-04-25 (×9): 20 [IU] via SUBCUTANEOUS
  Filled 2019-04-20 (×13): qty 0.2

## 2019-04-20 MED ORDER — VANCOMYCIN VARIABLE DOSE PER UNSTABLE RENAL FUNCTION (PHARMACIST DOSING): Status: DC

## 2019-04-20 NOTE — NC FL2 (Signed)
[place] LEVEL OF CARE SCREENING TOOL     IDENTIFICATION  Patient Name: Kenneth Copeland Birthdate: [birth year] Sex: male Admission Date (Current Location): 04/08/2019  [place] and [place] Number:  Engineering geologist and Address:  [facility], [address] [place], [place] [address]      Provider Number: [id number]  Attending Physician Name and Address:  Loletha Grayer, MD  Relative Name and Phone Number:  Tameem, [place]   [phone] or Raj Janus Significant other   [phone]     Current Level of Care: Hospital Recommended Level of Care: [place] Prior Approval Number:    Date Approved/Denied:   PASRR Number: [id number]  Discharge Plan: SNF    Current Diagnoses: Patient Active Problem List   Diagnosis Date Noted  . Acute respiratory failure ([place])   . Chest pain 04/08/2019  . Pericardial effusion 04/02/2019    Orientation RESPIRATION BLADDER Height & Weight     Self, Time, Situation, Place  Normal Continent Weight: 182 lb 9.6 oz (82.8 kg) Height:  5\' 11"  (180.3 cm)  BEHAVIORAL SYMPTOMS/MOOD NEUROLOGICAL BOWEL NUTRITION STATUS      Continent Diet(Mechanical Soft)  AMBULATORY STATUS COMMUNICATION OF NEEDS Skin   Supervision Verbally Normal                       Personal Care Assistance Level of Assistance  Bathing, Feeding, Dressing Bathing Assistance: Limited assistance Feeding assistance: Independent Dressing Assistance: Limited assistance     Functional Limitations Info  Sight, Hearing, Speech Sight Info: Adequate Hearing Info: Adequate Speech Info: Adequate    SPECIAL CARE FACTORS FREQUENCY  PT (By licensed PT), OT (By licensed OT)     PT Frequency: Minimum 2x a week OT Frequency: Minimum 2x a week            Contractures Contractures Info: Not present    Additional Factors Info  Code Status, Allergies, Insulin Sliding Scale, Isolation Precautions Code Status  Info: Full Code Allergies Info: METFORMIN AND RELATED, MOTRIN IBUPROFEN    Insulin Sliding Scale Info: insulin aspart (novoLOG) injection 0-20 Units minum 5x a day Isolation Precautions Info: MRSA contact precautions     Current Medications (04/20/2019):  This is the current hospital active medication list Current Facility-Administered Medications  Medication Dose Route Frequency Provider Last Rate Last Dose  . 0.9 %  sodium chloride infusion   Intravenous PRN Hillary Bow, MD   Stopped at 04/15/19 0507  . acetaminophen (TYLENOL) tablet 650 mg  650 mg Oral Q6H PRN Henreitta Leber, MD   650 mg at 04/12/19 0010   Or  . acetaminophen (TYLENOL) suppository 650 mg  650 mg Rectal Q6H PRN Henreitta Leber, MD      . amiodarone (PACERONE) tablet 200 mg  200 mg Oral BID Callwood, Dwayne D, MD   200 mg at 04/19/19 2140  . artificial tears (LACRILUBE) ophthalmic ointment 1 application  1 application Left Eye BID PRN Henreitta Leber, MD      . colchicine tablet 0.6 mg  0.6 mg Oral BID Henreitta Leber, MD   0.6 mg at 04/20/19 0905  . dicyclomine (BENTYL) capsule 10 mg  10 mg Oral QID Henreitta Leber, MD   10 mg at 04/20/19 0906  . docusate sodium (COLACE) capsule 100 mg  100 mg Oral Daily PRN Flora Lipps, MD   100 mg at 04/11/19 1717  . DULoxetine (CYMBALTA) DR capsule 30 mg  30  mg Oral Daily Henreitta Leber, MD   30 mg at 04/20/19 6144  . enoxaparin (LOVENOX) injection 40 mg  40 mg Subcutaneous Daily Flora Lipps, MD   40 mg at 04/20/19 0905  . feeding supplement (ENSURE ENLIVE) (ENSURE ENLIVE) liquid 237 mL  237 mL Oral BID BM Loletha Grayer, MD   237 mL at 04/19/19 1507  . insulin aspart (novoLOG) injection 0-20 Units  0-20 Units Subcutaneous TID WC Flora Lipps, MD   4 Units at 04/19/19 1239  . insulin aspart (novoLOG) injection 0-5 Units  0-5 Units Subcutaneous QHS Flora Lipps, MD   2 Units at 04/14/19 2036  . insulin aspart (novoLOG) injection 5 Units  5 Units Subcutaneous TID WC Flora Lipps, MD   5 Units at 04/19/19 1239  . insulin glargine (LANTUS) injection 20 Units  20 Units Subcutaneous BID Wieting, Richard, MD      . ipratropium-albuterol (DUONEB) 0.5-2.5 (3) MG/3ML nebulizer solution 3 mL  3 mL Nebulization Q6H PRN Kasa, Kurian, MD      . lidocaine (XYLOCAINE) 2 % injection 0-20 mL  0-20 mL Intradermal Once PRN Callwood, Dwayne D, MD      . meloxicam (MOBIC) tablet 7.5 mg  7.5 mg Oral Daily Loletha Grayer, MD   7.5 mg at 04/20/19 0906  . metoCLOPramide (REGLAN) tablet 10 mg  10 mg Oral TID AC & HS Henreitta Leber, MD   10 mg at 04/20/19 0906  . morphine 2 MG/ML injection 2 mg  2 mg Intravenous Q4H PRN Darel Hong D, NP   2 mg at 04/20/19 3154  . multivitamin with minerals tablet 1 tablet  1 tablet Oral Daily Loletha Grayer, MD   1 tablet at 04/20/19 0905  . neomycin-polymyxin b-dexamethasone (MAXITROL) ophthalmic suspension 1 drop  1 drop Left Eye Q6H Henreitta Leber, MD   1 drop at 04/20/19 0856  . ondansetron (ZOFRAN) tablet 4 mg  4 mg Oral Q6H PRN Henreitta Leber, MD       Or  . ondansetron (ZOFRAN) injection 4 mg  4 mg Intravenous Q6H PRN Henreitta Leber, MD      . oxyCODONE (Oxy IR/ROXICODONE) immediate release tablet 15 mg  15 mg Oral Q4H PRN Loletha Grayer, MD   15 mg at 04/19/19 1740  . oxyCODONE (OXYCONTIN) 12 hr tablet 15 mg  15 mg Oral Q12H Loletha Grayer, MD   15 mg at 04/20/19 0905  . pantoprazole (PROTONIX) EC tablet 40 mg  40 mg Oral BID Henreitta Leber, MD   40 mg at 04/20/19 0906  . polyvinyl alcohol (LIQUIFILM TEARS) 1.4 % ophthalmic solution 2 drop  2 drop Left Eye TID Henreitta Leber, MD   2 drop at 04/20/19 0856  . promethazine (PHENERGAN) injection 12.5 mg  12.5 mg Intravenous Q6H PRN Merlyn Lot, MD   12.5 mg at 04/08/19 0734  . sodium chloride flush (NS) 0.9 % injection 10-40 mL  10-40 mL Intracatheter PRN Henreitta Leber, MD      . sodium chloride flush (NS) 0.9 % injection 10-40 mL  10-40 mL Intracatheter Q12H Henreitta Leber, MD   10 mL at 04/20/19 0906  . sodium chloride flush (NS) 0.9 % injection 10-40 mL  10-40 mL Intracatheter PRN Henreitta Leber, MD      . sucralfate (CARAFATE) tablet 1 g  1 g Oral QID Henreitta Leber, MD   1 g at 04/20/19 0906  . tamsulosin (FLOMAX) capsule 0.4  mg  0.4 mg Oral Daily Henreitta Leber, MD   0.4 mg at 04/20/19 9826     Discharge Medications: Please see discharge summary for a list of discharge medications.  Relevant Imaging Results:  Relevant Lab Results:   Additional Information SSN [id number]  Ross Ludwig, LCSW

## 2019-04-20 NOTE — Progress Notes (Signed)
Pharmacy Electrolyte Monitoring Consult:  Pharmacy consulted to assist in monitoring and replacing electrolytes in this 53 y.o. male admitted on 04/08/2019 with Shortness of Breath and Chest Pain   Labs:  Sodium (mmol/L)  Date Value  04/20/2019 133 (L)   Potassium (mmol/L)  Date Value  04/20/2019 3.8   Magnesium (mg/dL)  Date Value  04/20/2019 2.0   Phosphorus (mg/dL)  Date Value  04/09/2019 2.9   Calcium (mg/dL)  Date Value  04/20/2019 8.2 (L)   Albumin (g/dL)  Date Value  04/08/2019 3.0 (L)    Assessment/Plan: K 3.8, Mag 2.0,  Scr 1.15 (up from 0.85)  Patient on MagOx 400 PO daily.    No additional replenishment needed today x 2 (goal potassium ~ 4 and goal magnesium ~ 2)   Pharmacy will continue to monitor Scr for Vancomycin consult, but will sign off on electrolytes at this time.  Lu Duffel, PharmD, BCPS Clinical Pharmacist 04/20/2019 7:54 AM

## 2019-04-20 NOTE — TOC Progression Note (Signed)
Transition of Care [facility]) - Progression Note    Patient Details  Name: Kenneth Copeland MRN: [id number] Date of Birth: [birth year]  Transition of [facility]) CM/SW Contact  Ross Ludwig, [place] Phone Number: 04/20/2019, 6:32 PM  Clinical Narrative:     CSW spoke with patient and informed him that [facility] SNF has verified that he will have [facility] insurance on Monday.  [place] said they can accept patient for IV antibiotics once insurance approves.  Claiborne Billings in admissions said she will start insurance auth on Monday June 1st, to get approval for SNF.  Patient is in agreement with going to SNF.  CSW continuing to follow patient's progress throughout discharge planning.   Expected Discharge Plan: [place] Barriers to Discharge: Continued Medical Work up  Expected Discharge Plan and Services Expected Discharge Plan: [place]   Discharge Planning Services: CM Consult Post Acute Care Choice: [place] arrangements for the past 2 months: Apartment Expected Discharge Date: 04/10/19                         HH Arranged: RN, IV Antibiotics HH Agency: [place] ([place]) Date HH Agency Contacted: 04/12/19 Time [place]: 1303 Representative spoke with at [place]: [email or web address]   Social Determinants of Health ([place]) Interventions    Readmission Risk Interventions Readmission Risk Prevention Plan 04/12/2019  Transportation Screening Complete  Home Care Screening Complete  Medication Review (RN CM) Complete

## 2019-04-20 NOTE — Progress Notes (Addendum)
Pharmacy Antibiotic Note  Kenneth Copeland is a 53 y.o. male admitted on 04/08/2019 with chest pain and shortness of breath. Pharmacy has been consulted for vancomycin dosing.  Blood cultures with persistent MRSA bacteremia. TEE negative for endocarditis. ID is following the patient.  Previous Dose/assessment: Vancomycin 1250mg  mg IV Q 8 hrs. Goal AUC 400-550.  5/26 23:05 - Vancomycin dose 1250mg  q8 @ steady state  5/27 01:40 - Vancomycin peak 33 mcg/ml 5/27 07:00 - Vancomycin trough 41mcg/ml  (Using the Vancomycin 2 level PK calculator, the calculated Ke was 0.1357 and t 1/2 5.1, with an estimated AUC of 618 - Cmin 15.8, Cmax 38.2)  Dose was then changed to: 1000mg  q 8 hours -  estimated AUC of 494 (Cmin 12.2, Cmax 31.6)  5/29 0620 Vanc dose 1000mg   5/29 0833 Vanc Peak 56 ug/ml 5/29 1437 Vanc trough 31 ug/ml  Patient cleared quite a bit and likely had accumulated - will recheck random vanc @ 2200 and restart dosing based on random vanc level   Height: 5\' 11"  (180.3 cm) Weight: 182 lb 9.6 oz (82.8 kg) IBW/kg (Calculated) : 75.3  Temp (24hrs), Avg:98.2 F (36.8 C), Min:97.8 F (36.6 C), Max:98.6 F (37 C)  Recent Labs  Lab 04/16/19 1159 04/17/19 0537 04/18/19 0138 04/18/19 0140 04/18/19 0700 04/19/19 0457 04/20/19 0613 04/20/19 0833 04/20/19 1437  CREATININE 0.59* 0.62 0.68  --   --  0.85 1.15  --   --   VANCOTROUGH  --   --   --   --  16  --   --   --  31*  VANCOPEAK  --   --   --  33  --   --   --  56*  --     Estimated Creatinine Clearance: 81.8 mL/min (by C-G formula based on SCr of 1.15 mg/dL).    Allergies  Allergen Reactions  . Metformin And Related     Pt. States it makes his stomach bleed because of a stomach ulcer  . Motrin [Ibuprofen]     Pt. States it "inflames his stomach, causes bleeding in stomach"    Antimicrobials this admission: Vancomycin 5/19 >> Daptomycin 5/22 >>  Dose adjustments this admission: 5/20 Vanc 1750 mg q12h >> 1500 mg q12h  >> 1250 mg q8H> 1000mg  q 8  Microbiology results: 5/18 BCx: 2/4 bottles MRSA 5/21 BCx: 2/4 bottles MRSA 5/22 BCx: NG x 5 days 5/25 BCx: NG x 2 days 5/19 UCx: no growth  5/18 MRSA PCR: negative 5/17 SARS-CoV-2: negative  Thank you for allowing pharmacy to be a part of this patient's care.  Lu Duffel, PharmD, BCPS Clinical Pharmacist 04/20/2019 1:42 PM

## 2019-04-20 NOTE — Progress Notes (Signed)
ID:  Says he is feeling a little better Pain chest improving since starting on NSAID  Patient Vitals for the past 24 hrs:  BP Temp Temp src Pulse Resp SpO2  04/20/19 1648 112/87 98.3 F (36.8 C) Oral 99 - 95 %  04/20/19 1326 101/68 - - 97 - -  04/20/19 0853 (!) 82/62 97.8 F (36.6 C) Oral 94 16 96 %  04/20/19 0616 107/76 - - 94 - -  04/20/19 0341 (!) 88/63 - - 94 - 95 %  04/20/19 0339 (!) 89/71 98.1 F (36.7 C) Oral 94 - 96 %  04/19/19 2057 97/74 98.6 F (37 C) Oral 97 - 94 %   Chest b/l air entry HS s1s2  Medications:  . amiodarone  200 mg Oral BID  . colchicine  0.6 mg Oral BID  . dicyclomine  10 mg Oral QID  . DULoxetine  30 mg Oral Daily  . enoxaparin (LOVENOX) injection  40 mg Subcutaneous Daily  . feeding supplement (ENSURE ENLIVE)  237 mL Oral BID BM  . insulin aspart  0-20 Units Subcutaneous TID WC  . insulin aspart  0-5 Units Subcutaneous QHS  . insulin aspart  5 Units Subcutaneous TID WC  . insulin glargine  20 Units Subcutaneous BID  . meloxicam  7.5 mg Oral Daily  . metoCLOPramide  10 mg Oral TID AC & HS  . multivitamin with minerals  1 tablet Oral Daily  . neomycin-polymyxin b-dexamethasone  1 drop Left Eye Q6H  . oxyCODONE  15 mg Oral Q12H  . pantoprazole  40 mg Oral BID  . polyvinyl alcohol  2 drop Left Eye TID  . sodium chloride flush  10-40 mL Intracatheter Q12H  . sucralfate  1 g Oral QID  . tamsulosin  0.4 mg Oral Daily  . 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  MRSA bacteremia- unclear source-TEE Neg, entire spine imaged and N Repeat blood culture from 5/21 positive again- On Vanco ( MIC 1) added daptomycin as vanco level was not therapeuttic- -DC daptomycin on 5/27 Now vanco trough is 31 and dose being adjusted- currently on hold- need to make sur ehte creatinine is not worsening as then will have to switch him back to Dapto  Pericardial effusion ? Due to MRSA Concern for MRSA pericarditis- discussed with Cardiologist Dr.PAraschos, the fluid is minimal and he may be a difficult aspiration especially with previous abdominal surgeries From an antibiotic perspective it will not alter the duration whether he has MRSA in the pericardial fluid as the procedure is considered risky in his case  Intrahepatic cholangio carcinoma-s/p partial hepatectomy and Chemo CT abdomen showed a pancreatic tail lesion- concerning for adenocarcinoma VS necrosis- EUS and biopsy is recommended  Malignant pericardial effusion?? With h/o hepatic carcinoma and s/p hepatectomy and chemo this should be entertainedbut remote  Anemia - ? Of chronic disease, h/o GI bleed   DM-poorly controlled- management as per primary team    HE cannot be  discharged home with PICC due to cocaine use Discussed the management with the patient and hospitalist. ID will follow peripherally this weekend

## 2019-04-20 NOTE — Progress Notes (Signed)
[name], male   DOB: 1966-08-14, 53 y.o.   MRN: [id number]   [facility] PROGRESS NOTE  Kenneth Copeland EML:[id number] DOB: 09/24/1966 DOA: 04/08/2019 PCP: Patient, No Pcp Per  HPI/Subjective: Patient still having chest pain and left shoulder pain.  Patient states he is eating a little bit.  Sugars slightly on the lower side.  Objective: Vitals:   04/20/19 0853 04/20/19 1326  BP: (!) 82/62 101/68  Pulse: 94 97  Resp: 16   Temp: 97.8 F (36.6 C)   SpO2: 96%     Filed Weights   04/16/19 0403 04/17/19 0538 04/18/19 0337  Weight: 83.7 kg 82.3 kg 82.8 kg    ROS: Review of Systems  Constitutional: Negative for chills and fever.  Eyes: Negative for blurred vision.  Respiratory: Negative for cough and shortness of breath.   Cardiovascular: Negative for chest pain.  Gastrointestinal: Negative for abdominal pain, constipation, diarrhea, nausea and vomiting.  Genitourinary: Negative for dysuria.  Musculoskeletal: Positive for joint pain.  Neurological: Negative for dizziness and headaches.   Exam: Physical Exam  Constitutional: He is oriented to person, place, and time.  HENT:  Nose: No mucosal edema.  Mouth/Throat: No oropharyngeal exudate or posterior oropharyngeal edema.  Eyes: Pupils are equal, round, and reactive to light. Conjunctivae, EOM and lids are normal.  Neck: No JVD present. Carotid bruit is not present. No edema present. No thyroid mass and no thyromegaly present.  Cardiovascular: Regular rhythm, S1 normal and S2 normal. Exam reveals no gallop.  No murmur heard. Pulses:      Dorsalis pedis pulses are 2+ on the right side and 2+ on the left side.  Respiratory: No respiratory distress. He has decreased breath sounds in the right lower field. He has no wheezes. He has no rhonchi. He has no rales.  GI: Soft. Bowel sounds are normal. There is no abdominal tenderness.  Musculoskeletal:     Left shoulder: He exhibits tenderness. He exhibits no  swelling.  Lymphadenopathy:    He has no cervical adenopathy.  Neurological: He is alert and oriented to person, place, and time. No cranial nerve deficit.  Skin: Skin is warm. No rash noted. Nails show no clubbing.  Psychiatric: He has a normal mood and affect.      Data Reviewed: Basic Metabolic Panel: Recent Labs  Lab 04/15/19 0242 04/16/19 1159 04/17/19 0537 04/18/19 0138 04/19/19 0457 04/20/19 0613  NA 137 135 134* 131* 134* 133*  K 3.7 4.1 4.0 3.7 3.8 3.8  CL 97* 96* 95* 94* 96* 95*  CO2 32 29 30 28 28 28   GLUCOSE 212* 197* 99 171* 69* 100*  BUN 9 11 12 10 13 12   CREATININE 0.60* 0.59* 0.62 0.68 0.85 1.15  CALCIUM 8.2* 8.1* 8.4* 7.9* 8.3* 8.2*  MG 1.5* 1.9  --  1.8 2.3 2.0  Cardiac Enzymes: Recent Labs  Lab 04/14/19 0232  CKTOTAL 13*   BNP (last 3 results) Recent Labs    04/02/19 0909 04/08/19 0652  BNP 60.0 184.0*      CBG: Recent Labs  Lab 04/19/19 1209 04/19/19 1641 04/19/19 2057 04/20/19 0847 04/20/19 1139  GLUCAP 185* 90 144* 94 142*    Recent Results (from the past 240 hour(s))  Culture, blood (Routine X 2) w Reflex to ID Panel     Status: Abnormal   Collection Time: 04/12/19  1:04 AM  Result Value Ref Range Status   Specimen Description   Final    BLOOD BLOOD LEFT FOREARM  Performed at [facility], [address] [place], [place] [address]    Special Requests   Final    BOTTLES DRAWN AEROBIC ONLY Blood Culture results may not be optimal due to an inadequate volume of blood received in culture bottles Performed at [facility], [address] [place], [place] [address]    Culture  Setup Time   Final    GRAM POSITIVE COCCI AEROBIC BOTTLE ONLY CRITICAL RESULT CALLED TO, READ BACK BY AND VERIFIED WITH: JASON ROBBINS ON 04/12/2019 AT 2105 [facility] Performed at [facility], [address] [place], [place] [address]    Culture (A)  Final    STAPHYLOCOCCUS AUREUS SUSCEPTIBILITIES PERFORMED ON PREVIOUS CULTURE  WITHIN THE LAST 5 DAYS. Performed at [facility], [place] [address], [place], [place] [address]    Report Status 04/15/2019 FINAL  Final  Culture, blood (Routine X 2) w Reflex to ID Panel     Status: Abnormal   Collection Time: 04/12/19  1:04 AM  Result Value Ref Range Status   Specimen Description   Final    BLOOD BLOOD LEFT HAND Performed at [facility], [address], [place], [place] [address]    Special Requests   Final    BOTTLES DRAWN AEROBIC AND ANAEROBIC Blood Culture adequate volume Performed at [facility], [address] [place], [place] [address]    Culture  Setup Time   Final    Organism ID to follow GRAM POSITIVE COCCI AEROBIC BOTTLE ONLY CRITICAL RESULT CALLED TO, READ BACK BY AND VERIFIED WITH: JASON ROBBINS ON 04/12/2019 AT 2105 [facility] Performed at [facility], [address], [place],  [address]    Culture METHICILLIN RESISTANT STAPHYLOCOCCUS AUREUS (A)  Final   Report Status 04/15/2019 FINAL  Final   Organism ID, Bacteria METHICILLIN RESISTANT STAPHYLOCOCCUS AUREUS  Final      Susceptibility   Methicillin resistant staphylococcus aureus - MIC*    CIPROFLOXACIN >=8 RESISTANT Resistant     ERYTHROMYCIN >=8 RESISTANT Resistant     GENTAMICIN <=0.5 SENSITIVE Sensitive     OXACILLIN >=4 RESISTANT Resistant     TETRACYCLINE <=1 SENSITIVE Sensitive     VANCOMYCIN 1 SENSITIVE Sensitive     TRIMETH/SULFA >=320 RESISTANT Resistant     CLINDAMYCIN >=8 RESISTANT Resistant     RIFAMPIN <=0.5 SENSITIVE Sensitive     Inducible Clindamycin NEGATIVE Sensitive     * METHICILLIN RESISTANT STAPHYLOCOCCUS AUREUS  Blood Culture ID Panel (Reflexed)     Status: Abnormal   Collection Time: 04/12/19  1:04 AM  Result Value Ref Range Status   Enterococcus species NOT DETECTED NOT DETECTED Final   Listeria monocytogenes NOT DETECTED NOT DETECTED Final   Staphylococcus species DETECTED (A) NOT DETECTED Final    Comment: CRITICAL RESULT  CALLED TO, READ BACK BY AND VERIFIED WITH: CALLED JASON ROBBINS @2105  04/12/2019 [facility]    Staphylococcus aureus (BCID) DETECTED (A) NOT DETECTED Final    Comment: Methicillin (oxacillin)-resistant Staphylococcus aureus (MRSA). MRSA is predictably resistant to beta-lactam antibiotics (except ceftaroline). Preferred therapy is vancomycin unless clinically contraindicated. Patient requires contact precautions if  hospitalized. CRITICAL RESULT CALLED TO, READ BACK BY AND VERIFIED WITH: JASON ROBBINS @2105  04/12/2019 BY [facility].PMF    Methicillin resistance DETECTED (A) NOT DETECTED Final    Comment: CRITICAL RESULT CALLED TO, READ BACK BY AND VERIFIED WITH: JASON ROBBINS @2105  04/12/2019 BY [facility].PMF    Streptococcus species NOT DETECTED NOT DETECTED Final   Streptococcus agalactiae  NOT DETECTED NOT DETECTED Final   Streptococcus pneumoniae NOT DETECTED NOT DETECTED Final   Streptococcus pyogenes NOT DETECTED NOT DETECTED Final   Acinetobacter baumannii NOT DETECTED NOT DETECTED Final   Enterobacteriaceae species NOT DETECTED NOT DETECTED Final   Enterobacter cloacae complex NOT DETECTED NOT DETECTED Final   Escherichia coli NOT DETECTED NOT DETECTED Final   Klebsiella oxytoca NOT DETECTED NOT DETECTED Final   Klebsiella pneumoniae NOT DETECTED NOT DETECTED Final   Proteus species NOT DETECTED NOT DETECTED Final   Serratia marcescens NOT DETECTED NOT DETECTED Final   Haemophilus influenzae NOT DETECTED NOT DETECTED Final   Neisseria meningitidis NOT DETECTED NOT DETECTED Final   Pseudomonas aeruginosa NOT DETECTED NOT DETECTED Final   Candida albicans NOT DETECTED NOT DETECTED Final   Candida glabrata NOT DETECTED NOT DETECTED Final   Candida krusei NOT DETECTED NOT DETECTED Final   Candida parapsilosis NOT DETECTED NOT DETECTED Final   Candida tropicalis NOT DETECTED NOT DETECTED Final    Comment: Performed at [facility], [address] [place], [place] [address]  CULTURE, BLOOD  (ROUTINE X 2) w Reflex to ID Panel     Status: None   Collection Time: 04/13/19  1:05 PM  Result Value Ref Range Status   Specimen Description BLOOD LEFT HAND  Final   Special Requests   Final    BOTTLES DRAWN AEROBIC AND ANAEROBIC Blood Culture adequate volume   Culture   Final    NO GROWTH 5 DAYS Performed at [facility], [address] [place], [place] [address]    Report Status 04/18/2019 FINAL  Final  CULTURE, BLOOD (ROUTINE X 2) w Reflex to ID Panel     Status: None   Collection Time: 04/13/19  1:40 PM  Result Value Ref Range Status   Specimen Description BLOOD BLOOD LEFT HAND  Final   Special Requests   Final    BOTTLES DRAWN AEROBIC AND ANAEROBIC Blood Culture adequate volume   Culture   Final    NO GROWTH 5 DAYS Performed at [facility], [address] [place], [place] [address]    Report Status 04/18/2019 FINAL  Final  CULTURE, BLOOD (ROUTINE X 2) w Reflex to ID Panel     Status: None (Preliminary result)   Collection Time: 04/16/19 11:59 AM  Result Value Ref Range Status   Specimen Description BLOOD [facility]  Final   Special Requests   Final    BOTTLES DRAWN AEROBIC AND ANAEROBIC Blood Culture adequate volume   Culture   Final    NO GROWTH 4 DAYS Performed at [facility], [address] [place], [place] [address]    Report Status PENDING  Incomplete  CULTURE, BLOOD (ROUTINE X 2) w Reflex to ID Panel     Status: None (Preliminary result)   Collection Time: 04/16/19  1:09 PM  Result Value Ref Range Status   Specimen Description BLOOD LW  Final   Special Requests   Final    BOTTLES DRAWN AEROBIC AND ANAEROBIC Blood Culture adequate volume   Culture   Final    NO GROWTH 4 DAYS Performed at [facility], [address], [place],  [address]    Report Status PENDING  Incomplete     Studies: No results found.  Scheduled Meds: . amiodarone  200 mg Oral BID  . colchicine  0.6 mg Oral BID  . dicyclomine  10 mg Oral  QID  . DULoxetine  30 mg Oral Daily  .  enoxaparin (LOVENOX) injection  40 mg Subcutaneous Daily  . feeding supplement (ENSURE ENLIVE)  237 mL Oral BID BM  . insulin aspart  0-20 Units Subcutaneous TID WC  . insulin aspart  0-5 Units Subcutaneous QHS  . insulin aspart  5 Units Subcutaneous TID WC  . insulin glargine  20 Units Subcutaneous BID  . meloxicam  7.5 mg Oral Daily  . metoCLOPramide  10 mg Oral TID AC & HS  . multivitamin with minerals  1 tablet Oral Daily  . neomycin-polymyxin b-dexamethasone  1 drop Left Eye Q6H  . oxyCODONE  15 mg Oral Q12H  . pantoprazole  40 mg Oral BID  . polyvinyl alcohol  2 drop Left Eye TID  . sodium chloride flush  10-40 mL Intracatheter Q12H  . sucralfate  1 g Oral QID  . tamsulosin  0.4 mg Oral Daily   Continuous Infusions: . sodium chloride Stopped (04/15/19 0507)    Assessment/Plan:  1. MRSA bacteremia.  Patient seen by infectious disease. Patient on IV vancomycin.  Last positive blood culture 04/12/2019.  TEE negative.  Patient has a pericardial effusion and pericardial centesis would not change length of therapy so this was deferred.  Patient has a PICC line.  Social worker and care management team looking into options about disposition.  Potential rehab for continued antibiotic therapy.  Rehab may not happen until June 1. 2. Left shoulder pain and left upper chest pain.  Shoulder pain may be referred.  Patient does have some degenerative changes. 3. Pericardial effusion.  No tamponade seen.  My associate spoke with cardiothoracic surgery at [facility] and the patient does not need a pericardial window.  4. Atrial fibrillation.  Now on oral amiodarone 5. COPD.  Continue nebulizer treatments. 6. Chronic pain and neuropathy continue gabapentin and Cymbalta and OxyContin dose increased and oxycodone.  7. Type 2 diabetes mellitus without complication on sliding scale and glargine insulin.  Patient sugar was on the lower side this morning so I cut  back glargine insulin to 20 units twice daily.  Hemoglobin A1c elevated at 10.5.  Code Status:     Code Status Orders  (From admission, onward)         Start     Ordered   04/08/19 1225  Full code  Continuous     04/08/19 1224        Code Status History    Date Active Date Inactive Code Status Order ID Comments User Context   04/02/2019 1332 04/05/2019 1916 Full Code [id number]  Lang Snow, NP ED      Disposition Plan: Disposition will be an issue at this point with his insurance.  Consultants:  Infectious disease  Cardiology  Orthopedic surgery  Procedures:  TEE  Antibiotics:  Vancomycin  Time spent: 27 minutes  So-Hi

## 2019-04-20 NOTE — Progress Notes (Addendum)
OT Cancellation Note  Patient Details Name: Kenneth Copeland MRN: [id number] DOB: 03-Mar-1966   Cancelled Treatment:    Reason Eval/Treat Not Completed: Patient declined, no reason specified(Pt. refused OT reporting the he is not doing "any therapy until he gets to rehab" Pt. reports 8/10 chest, and shoulder pain.) Will continue monitor, and assess as needed.   Harrel Carina, MS, OTR/L 04/20/2019, 3:03 PM

## 2019-04-21 LAB — GASTROINTESTINAL PANEL BY PCR, STOOL (REPLACES STOOL CULTURE)

## 2019-04-21 LAB — CULTURE, BLOOD (ROUTINE X 2)
Culture: NO GROWTH
Culture: NO GROWTH
Special Requests: ADEQUATE
Special Requests: ADEQUATE

## 2019-04-21 LAB — CREATININE, SERUM
Creatinine, Ser: 1.23 mg/dL (ref 0.61–1.24)
GFR calc Af Amer: >60 mL/min (ref >60)
GFR calc non Af Amer: >60 mL/min (ref >60)

## 2019-04-21 LAB — VANCOMYCIN, RANDOM: Vancomycin Rm: 17

## 2019-04-21 LAB — GLUCOSE, CAPILLARY
Glucose-Capillary: 89 mg/dL (ref 70–99)
Glucose-Capillary: 245 mg/dL — ABNORMAL HIGH (ref 70–99)
Glucose-Capillary: 169 mg/dL — ABNORMAL HIGH (ref 70–99)
Glucose-Capillary: 166 mg/dL — ABNORMAL HIGH (ref 70–99)

## 2019-04-21 LAB — C DIFFICILE QUICK SCREEN W PCR REFLEX
C Diff antigen: NEGATIVE
C Diff interpretation: NOT DETECTED
C Diff toxin: NEGATIVE

## 2019-04-21 MED ORDER — VANCOMYCIN HCL IN DEXTROSE 1-5 GM/200ML-% IV SOLN
1000.0000 mg | Freq: Two times a day (BID) | INTRAVENOUS | Status: DC
  Administered 2019-04-21 – 2019-04-23 (×6): 1000 mg via INTRAVENOUS
  Filled 2019-04-21 (×8): qty 200

## 2019-04-21 MED ORDER — SODIUM CHLORIDE 0.9 % IV SOLN
INTRAVENOUS | Status: AC
  Administered 2019-04-21 – 2019-04-22 (×2): via INTRAVENOUS

## 2019-04-21 NOTE — Progress Notes (Signed)
Pharmacy Antibiotic Note  Kenneth Copeland is a 53 y.o. male admitted on 04/08/2019 with chest pain and shortness of breath. Pharmacy has been consulted for vancomycin dosing.  Blood cultures with persistent MRSA bacteremia. TEE negative for endocarditis. ID is following the patient.  Previous Dose/assessment: Vancomycin 1250mg  mg IV Q 8 hrs. Goal AUC 400-550.  5/26 23:05 - Vancomycin dose 1250mg  q8 @ steady state  5/27 01:40 - Vancomycin peak 33 mcg/ml 5/27 07:00 - Vancomycin trough 100mcg/ml  (Using the Vancomycin 2 level PK calculator, the calculated Ke was 0.1357 and t 1/2 5.1, with an estimated AUC of 618 - Cmin 15.8, Cmax 38.2)  Dose was then changed to: 1000mg  q 8 hours -  estimated AUC of 494 (Cmin 12.2, Cmax 31.6)  5/29 0620 Vanc dose 1000mg   5/29 0833 Vanc Peak 56  5/29 1437 Vanc trough 31 ug/ml  PLAN 5/30 @ 0523 Vanc Random level: 17   Start Vancomycin 1000 mg IV Q 12 hrs. Goal AUC 400-550. Expected AUC: 502 SCr used: 1.23 Css: 14.5     Height: 5\' 11"  (180.3 cm) Weight: 179 lb 6.4 oz (81.4 kg) IBW/kg (Calculated) : 75.3  Temp (24hrs), Avg:98.4 F (36.9 C), Min:97.8 F (36.6 C), Max:99.2 F (37.3 C)  Recent Labs  Lab 04/17/19 0537 04/18/19 0138 04/18/19 0140 04/18/19 0700 04/19/19 0457 04/20/19 0613 04/20/19 0833 04/20/19 1437 04/21/19 0523  CREATININE 0.62 0.68  --   --  0.85 1.15  --   --  1.23  VANCOTROUGH  --   --   --  16  --   --   --  31*  --   VANCOPEAK  --   --  33  --   --   --  44*  --   --   VANCORANDOM  --   --   --   --   --   --   --   --  17    Estimated Creatinine Clearance: 76.5 mL/min (by C-G formula based on SCr of 1.23 mg/dL).    Allergies  Allergen Reactions  . Metformin And Related     Pt. States it makes his stomach bleed because of a stomach ulcer  . Motrin [Ibuprofen]     Pt. States it "inflames his stomach, causes bleeding in stomach"    Antimicrobials this admission: Vancomycin 5/19 >> Daptomycin 5/22 >>  Dose  adjustments this admission: 5/20 Vanc 1750 mg q12h >> 1500 mg q12h >> 1250 mg q8H> 1000mg  q 8  Microbiology results: 5/18 BCx: 2/4 bottles MRSA 5/21 BCx: 2/4 bottles MRSA 5/22 BCx: NG x 5 days 5/25 BCx: NG x 2 days 5/19 UCx: no growth  5/18 MRSA PCR: negative 5/17 SARS-CoV-2: negative  Thank you for allowing pharmacy to be a part of this patient's care.  Pernell Dupre, PharmD, BCPS Clinical Pharmacist 04/21/2019 6:12 AM

## 2019-04-21 NOTE — Progress Notes (Signed)
[name], male   DOB: 07-Mar-1966, 53 y.o.   MRN: [id number]   [employer] PROGRESS NOTE  [employer] FTD:[id number] DOB: 06/08/1966 DOA: 04/08/2019 PCP: Patient, No Pcp Per  HPI/Subjective: Patient denies any chest pain today, decreased p.o. intake  Sugars slightly on the lower side.  Reporting diarrhea  Objective: Vitals:   04/21/19 0520 04/21/19 0812  BP: 98/70 99/76  Pulse: (!) 101 99  Resp: 18   Temp: 98.2 F (36.8 C) 98.1 F (36.7 C)  SpO2: 96% 95%    Filed Weights   04/17/19 0538 04/18/19 0337 04/21/19 0515  Weight: 82.3 kg 82.8 kg 81.4 kg    ROS: Review of Systems  Constitutional: Negative for chills and fever.  Eyes: Negative for blurred vision.  Respiratory: Negative for cough and shortness of breath.   Cardiovascular: Negative for chest pain.  Gastrointestinal: Negative for abdominal pain, constipation, diarrhea, nausea and vomiting.  Genitourinary: Negative for dysuria.  Musculoskeletal: Positive for joint pain.  Neurological: Negative for dizziness and headaches.   Exam: Physical Exam  Constitutional: He is oriented to person, place, and time.  HENT:  Nose: No mucosal edema.  Mouth/Throat: No oropharyngeal exudate or posterior oropharyngeal edema.  Eyes: Pupils are equal, round, and reactive to light. Conjunctivae, EOM and lids are normal.  Neck: No JVD present. Carotid bruit is not present. No edema present. No thyroid mass and no thyromegaly present.  Cardiovascular: Regular rhythm, S1 normal and S2 normal. Exam reveals no gallop.  No murmur heard. Pulses:      Dorsalis pedis pulses are 2+ on the right side and 2+ on the left side.  Respiratory: No respiratory distress. He has decreased breath sounds in the right lower field. He has no wheezes. He has no rhonchi. He has no rales.  GI: Soft. Bowel sounds are normal. There is no abdominal tenderness.  Musculoskeletal:     Left shoulder: He exhibits tenderness. He exhibits no  swelling.  Lymphadenopathy:    He has no cervical adenopathy.  Neurological: He is alert and oriented to person, place, and time. No cranial nerve deficit.  Skin: Skin is warm. No rash noted. Nails show no clubbing.  Psychiatric: He has a normal mood and affect.      Data Reviewed: Basic Metabolic Panel: Recent Labs  Lab 04/15/19 0242 04/16/19 1159 04/17/19 0537 04/18/19 0138 04/19/19 0457 04/20/19 0623 04/21/19 0523  NA 137 135 134* 131* 134* 133*  --   K 3.7 4.1 4.0 3.7 3.8 3.8  --   CL 97* 96* 95* 94* 96* 95*  --   CO2 32 29 30 28 28 28   --   GLUCOSE 212* 197* 99 171* 69* 100*  --   BUN 9 11 12 10 13 12   --   CREATININE 0.60* 0.59* 0.62 0.68 0.85 1.15 1.23  CALCIUM 8.2* 8.1* 8.4* 7.9* 8.3* 8.2*  --   MG 1.5* 1.9  --  1.8 2.3 2.0  --   Cardiac Enzymes: No results for input(s): CKTOTAL, CKMB, CKMBINDEX, TROPONINI in the last 168 hours. BNP (last 3 results) Recent Labs    04/02/19 0909 04/08/19 0652  BNP 60.0 184.0*      CBG: Recent Labs  Lab 04/20/19 1139 04/20/19 1650 04/20/19 2059 04/21/19 0813 04/21/19 1208  GLUCAP 142* 136* 203* 89 245*    Recent Results (from the past 240 hour(s))  Culture, blood (Routine X 2) w Reflex to ID Panel     Status: Abnormal  Collection Time: 04/12/19  1:04 AM  Result Value Ref Range Status   Specimen Description   Final    BLOOD BLOOD LEFT FOREARM Performed at [facility], [address] [place], [place] [address]    Special Requests   Final    BOTTLES DRAWN AEROBIC ONLY Blood Culture results may not be optimal due to an inadequate volume of blood received in culture bottles Performed at [facility], [address] [place], [place] [address]    Culture  Setup Time   Final    GRAM POSITIVE COCCI AEROBIC BOTTLE ONLY CRITICAL RESULT CALLED TO, READ BACK BY AND VERIFIED WITH: JASON ROBBINS ON 04/12/2019 AT 2105 [facility] Performed at [facility], [address] [place], [place] [address]     Culture (A)  Final    STAPHYLOCOCCUS AUREUS SUSCEPTIBILITIES PERFORMED ON PREVIOUS CULTURE WITHIN THE LAST 5 DAYS. Performed at [facility], [address] [place], [place] [address]    Report Status 04/15/2019 FINAL  Final  Culture, blood (Routine X 2) w Reflex to ID Panel     Status: Abnormal   Collection Time: 04/12/19  1:04 AM  Result Value Ref Range Status   Specimen Description   Final    BLOOD BLOOD LEFT HAND Performed at [facility], [address] [place], [place] [address]    Special Requests   Final    BOTTLES DRAWN AEROBIC AND ANAEROBIC Blood Culture adequate volume Performed at [facility], [address] [place], [place] [address]    Culture  Setup Time   Final    Organism ID to follow GRAM POSITIVE COCCI AEROBIC BOTTLE ONLY CRITICAL RESULT CALLED TO, READ BACK BY AND VERIFIED WITH: JASON ROBBINS ON 04/12/2019 AT 2105 [facility] Performed at [facility], [address] [place], [place] [address]    Culture METHICILLIN RESISTANT STAPHYLOCOCCUS AUREUS (A)  Final   Report Status 04/15/2019 FINAL  Final   Organism ID, Bacteria METHICILLIN RESISTANT STAPHYLOCOCCUS AUREUS  Final      Susceptibility   Methicillin resistant staphylococcus aureus - MIC*    CIPROFLOXACIN >=8 RESISTANT Resistant     ERYTHROMYCIN >=8 RESISTANT Resistant     GENTAMICIN <=0.5 SENSITIVE Sensitive     OXACILLIN >=4 RESISTANT Resistant     TETRACYCLINE <=1 SENSITIVE Sensitive     VANCOMYCIN 1 SENSITIVE Sensitive     TRIMETH/SULFA >=320 RESISTANT Resistant     CLINDAMYCIN >=8 RESISTANT Resistant     RIFAMPIN <=0.5 SENSITIVE Sensitive     Inducible Clindamycin NEGATIVE Sensitive     * METHICILLIN RESISTANT STAPHYLOCOCCUS AUREUS  Blood Culture ID Panel (Reflexed)     Status: Abnormal   Collection Time: 04/12/19  1:04 AM  Result Value Ref Range Status   Enterococcus species NOT DETECTED NOT DETECTED Final   Listeria monocytogenes NOT DETECTED NOT DETECTED  Final   Staphylococcus species DETECTED (A) NOT DETECTED Final    Comment: CRITICAL RESULT CALLED TO, READ BACK BY AND VERIFIED WITH: CALLED JASON ROBBINS @2105  04/12/2019 [facility]    Staphylococcus aureus (BCID) DETECTED (A) NOT DETECTED Final    Comment: Methicillin (oxacillin)-resistant Staphylococcus aureus (MRSA). MRSA is predictably resistant to beta-lactam antibiotics (except ceftaroline). Preferred therapy is vancomycin unless clinically contraindicated. Patient requires contact precautions if  hospitalized. CRITICAL RESULT CALLED TO, READ BACK BY AND VERIFIED WITH: JASON ROBBINS @2105  04/12/2019 BY SAC.PMF    Methicillin resistance DETECTED (A) NOT DETECTED Final    Comment: CRITICAL RESULT CALLED TO,  READ BACK BY AND VERIFIED WITH: JASON ROBBINS @2105  04/12/2019 BY SAC.PMF    Streptococcus species NOT DETECTED NOT DETECTED Final   Streptococcus agalactiae NOT DETECTED NOT DETECTED Final   Streptococcus pneumoniae NOT DETECTED NOT DETECTED Final   Streptococcus pyogenes NOT DETECTED NOT DETECTED Final   Acinetobacter baumannii NOT DETECTED NOT DETECTED Final   Enterobacteriaceae species NOT DETECTED NOT DETECTED Final   Enterobacter cloacae complex NOT DETECTED NOT DETECTED Final   Escherichia coli NOT DETECTED NOT DETECTED Final   Klebsiella oxytoca NOT DETECTED NOT DETECTED Final   Klebsiella pneumoniae NOT DETECTED NOT DETECTED Final   Proteus species NOT DETECTED NOT DETECTED Final   Serratia marcescens NOT DETECTED NOT DETECTED Final   Haemophilus influenzae NOT DETECTED NOT DETECTED Final   Neisseria meningitidis NOT DETECTED NOT DETECTED Final   Pseudomonas aeruginosa NOT DETECTED NOT DETECTED Final   Candida albicans NOT DETECTED NOT DETECTED Final   Candida glabrata NOT DETECTED NOT DETECTED Final   Candida krusei NOT DETECTED NOT DETECTED Final   Candida parapsilosis NOT DETECTED NOT DETECTED Final   Candida tropicalis NOT DETECTED NOT DETECTED Final    Comment:  Performed at [facility], [address] [place], [place] [address]  CULTURE, BLOOD (ROUTINE X 2) w Reflex to ID Panel     Status: None   Collection Time: 04/13/19  1:05 PM  Result Value Ref Range Status   Specimen Description BLOOD LEFT HAND  Final   Special Requests   Final    BOTTLES DRAWN AEROBIC AND ANAEROBIC Blood Culture adequate volume   Culture   Final    NO GROWTH 5 DAYS Performed at [facility], [address] [place], [place] [address]    Report Status 04/18/2019 FINAL  Final  CULTURE, BLOOD (ROUTINE X 2) w Reflex to ID Panel     Status: None   Collection Time: 04/13/19  1:40 PM  Result Value Ref Range Status   Specimen Description BLOOD BLOOD LEFT HAND  Final   Special Requests   Final    BOTTLES DRAWN AEROBIC AND ANAEROBIC Blood Culture adequate volume   Culture   Final    NO GROWTH 5 DAYS Performed at [facility], [address] [place], [place] [address]    Report Status 04/18/2019 FINAL  Final  CULTURE, BLOOD (ROUTINE X 2) w Reflex to ID Panel     Status: None   Collection Time: 04/16/19 11:59 AM  Result Value Ref Range Status   Specimen Description BLOOD [facility]  Final   Special Requests   Final    BOTTLES DRAWN AEROBIC AND ANAEROBIC Blood Culture adequate volume   Culture   Final    NO GROWTH 5 DAYS Performed at [facility], [address] [place], [place] [address]    Report Status 04/21/2019 FINAL  Final  CULTURE, BLOOD (ROUTINE X 2) w Reflex to ID Panel     Status: None   Collection Time: 04/16/19  1:09 PM  Result Value Ref Range Status   Specimen Description BLOOD LW  Final   Special Requests   Final    BOTTLES DRAWN AEROBIC AND ANAEROBIC Blood Culture adequate volume   Culture   Final    NO GROWTH 5 DAYS Performed at [facility], [address] [place], [place] [address]    Report Status 04/21/2019 FINAL  Final     Studies: No results found.  Scheduled Meds: . amiodarone  200 mg Oral BID   .  colchicine  0.6 mg Oral BID  . dicyclomine  10 mg Oral QID  . DULoxetine  30 mg Oral Daily  . enoxaparin (LOVENOX) injection  40 mg Subcutaneous Daily  . feeding supplement (ENSURE ENLIVE)  237 mL Oral BID BM  . insulin aspart  0-20 Units Subcutaneous TID WC  . insulin aspart  0-5 Units Subcutaneous QHS  . insulin aspart  5 Units Subcutaneous TID WC  . insulin glargine  20 Units Subcutaneous BID  . meloxicam  7.5 mg Oral Daily  . metoCLOPramide  10 mg Oral TID AC & HS  . multivitamin with minerals  1 tablet Oral Daily  . neomycin-polymyxin b-dexamethasone  1 drop Left Eye Q6H  . oxyCODONE  15 mg Oral Q12H  . pantoprazole  40 mg Oral BID  . polyvinyl alcohol  2 drop Left Eye TID  . sodium chloride flush  10-40 mL Intracatheter Q12H  . sucralfate  1 g Oral QID  . tamsulosin  0.4 mg Oral Daily   Continuous Infusions: . sodium chloride Stopped (04/15/19 0507)  . vancomycin 1,000 mg (04/21/19 0955)    Assessment/Plan:  1. MRSA bacteremia.  Patient seen by infectious disease  Dr. Levester Fresh patient on IV vancomycin.  Last positive blood culture 04/12/2019.  TEE negative.  ID discussed with the Dr. Saralyn Pilar regarding pericarditis, from cardiology standpoint  pericardial centesis would not change length of therapy so this was deferred.  Procedure is risky in his case.  patient has a PICC line.  Social worker and care management team looking into options about disposition.  Potential rehab for continued antibiotic therapy.  Rehab may not happen until June 1.  Plan is to continue vancomycin now with close monitoring of the renal function and if renal function is getting worse plan is to switch him back to daptomycin 2. Left shoulder pain and left upper chest pain.  Shoulder pain may be referred.  Patient does have some degenerative changes. 3. Pericardial effusion.  No tamponade seen.  My associate spoke with cardiothoracic surgery at [facility] and the patient does not need a pericardial  window.  4. Atrial fibrillation.  Now on oral amiodarone 5. COPD.  Continue nebulizer treatments. 6. Chronic pain and neuropathy continue gabapentin and Cymbalta and OxyContin dose increased and oxycodone.  7. Type 2 diabetes mellitus without complication on sliding scale and glargine insulin.  Patient sugar was on the lower side this morning so I cut back glargine insulin to 20 units twice daily.  Hemoglobin A1c elevated at 10.5. 8. Diarrhea probably antibiotic induced rule out C. difficile colitis check stool for C. difficile toxin and GI panel.  Hydrate with IV fluid 9. Disposition based on PT assessment-rehab.  Await placement  Code Status:     Code Status Orders  (From admission, onward)         Start     Ordered   04/08/19 1225  Full code  Continuous     04/08/19 1224        Code Status History    Date Active Date Inactive Code Status Order ID Comments User Context   04/02/2019 1332 04/05/2019 1916 Full Code [id number]  Lang Snow, NP ED      Disposition Plan: Disposition will be an issue at this point with his insurance.  Consultants:  Infectious disease  Cardiology  Orthopedic surgery  Procedures:  TEE  Antibiotics:  Vancomycin  Time spent: 27 minutes  Irena

## 2019-04-21 NOTE — Progress Notes (Signed)
OT Cancellation Note  Patient Details Name: Kenneth Copeland MRN: [id number] DOB: 03-15-1966   Cancelled Treatment:      Attempted OT evaluation this date and patient continues to refuse, he reports he has diarrhea and is not willing to participate in OT at this time.  Will continue to monitor and attempt.  Dominique Calvey T Tomasita Morrow, OTR/L, CLT  Barbee Mamula 04/21/2019, 12:43 PM

## 2019-04-22 LAB — BASIC METABOLIC PANEL
Anion gap: 9 (ref 5–15)
BUN: 10 mg/dL (ref 6–20)
CO2: 27 mmol/L (ref 22–32)
Calcium: 8.2 mg/dL — ABNORMAL LOW (ref 8.9–10.3)
Chloride: 98 mmol/L (ref 98–111)
Creatinine, Ser: 1.12 mg/dL (ref 0.61–1.24)
GFR calc Af Amer: >60 mL/min (ref >60)
GFR calc non Af Amer: >60 mL/min (ref >60)
Glucose, Bld: 132 mg/dL — ABNORMAL HIGH (ref 70–99)
Potassium: 4.2 mmol/L (ref 3.5–5.1)
Sodium: 134 mmol/L — ABNORMAL LOW (ref 135–145)

## 2019-04-22 LAB — GLUCOSE, CAPILLARY
Glucose-Capillary: 109 mg/dL — ABNORMAL HIGH (ref 70–99)
Glucose-Capillary: 154 mg/dL — ABNORMAL HIGH (ref 70–99)
Glucose-Capillary: 159 mg/dL — ABNORMAL HIGH (ref 70–99)
Glucose-Capillary: 109 mg/dL — ABNORMAL HIGH (ref 70–99)

## 2019-04-22 LAB — TROPONIN I: Troponin I: 0.03 ng/mL (ref <0.03)

## 2019-04-22 MED ORDER — NITROGLYCERIN 0.4 MG SL SUBL
0.4000 mg | SUBLINGUAL_TABLET | SUBLINGUAL | Status: AC | PRN
  Administered 2019-04-22 (×3): 0.4 mg via SUBLINGUAL

## 2019-04-22 MED ORDER — NITROGLYCERIN 0.4 MG SL SUBL
SUBLINGUAL_TABLET | SUBLINGUAL | Status: AC
  Administered 2019-04-22: 0.4 mg via SUBLINGUAL
  Filled 2019-04-22: qty 1

## 2019-04-22 MED ORDER — SODIUM CHLORIDE 0.9 % IV SOLN: INTRAVENOUS | Status: DC

## 2019-04-22 NOTE — Progress Notes (Signed)
Pharmacy Antibiotic Note  Kenneth Copeland is a 53 y.o. male admitted on 04/08/2019 with chest pain and shortness of breath. Pharmacy has been consulted for vancomycin dosing.  Blood cultures with persistent MRSA bacteremia. TEE negative for endocarditis. ID is following the patient.  Previous Dose/assessment: Vancomycin 1250mg  mg IV Q 8 hrs. Goal AUC 400-550.  5/26 23:05 - Vancomycin dose 1250mg  q8 @ steady state  5/27 01:40 - Vancomycin peak 33 mcg/ml 5/27 07:00 - Vancomycin trough 35mcg/ml  (Using the Vancomycin 2 level PK calculator, the calculated Ke was 0.1357 and t 1/2 5.1, with an estimated AUC of 618 - Cmin 15.8, Cmax 38.2)  Dose was then changed to: 1000mg  q 8 hours -  estimated AUC of 494 (Cmin 12.2, Cmax 31.6)  5/29 0620 Vanc dose 1000mg   5/29 0833 Vanc Peak 56  5/29 1437 Vanc trough 31 ug/ml 5/30 @ 0523 Vanc Random level: 17   PLAN 5/31 Scr: 1.12 (down from 1.23).   Continue Vancomycin 1000 mg IV Q 12 hrs started. Goal AUC 400-550. Expected AUC: 460.2 SCr used: 1.12 Css: 12.8     Height: 5\' 11"  (180.3 cm) Weight: 179 lb 6.4 oz (81.4 kg) IBW/kg (Calculated) : 75.3  Temp (24hrs), Avg:98.3 F (36.8 C), Min:98.1 F (36.7 C), Max:98.4 F (36.9 C)  Recent Labs  Lab 04/18/19 0138 04/18/19 0140 04/18/19 0700 04/19/19 0457 04/20/19 1638 04/20/19 0833 04/20/19 1437 04/21/19 0523 04/22/19 0555  CREATININE 0.68  --   --  0.85 1.15  --   --  1.23 1.12  VANCOTROUGH  --   --  16  --   --   --  31*  --   --   VANCOPEAK  --  33  --   --   --  37*  --   --   --   VANCORANDOM  --   --   --   --   --   --   --  17  --     Estimated Creatinine Clearance: 84 mL/min (by C-G formula based on SCr of 1.12 mg/dL).    Allergies  Allergen Reactions  . Metformin And Related     Pt. States it makes his stomach bleed because of a stomach ulcer  . Motrin [Ibuprofen]     Pt. States it "inflames his stomach, causes bleeding in stomach"    Antimicrobials this  admission: Vancomycin 5/19 >> Daptomycin 5/22 >>  Dose adjustments this admission: 5/20 Vanc 1750 mg q12h >> 1500 mg q12h >> 1250 mg q8H> 1000mg  q 8  Microbiology results: 5/18 BCx: 2/4 bottles MRSA 5/21 BCx: 2/4 bottles MRSA 5/22 BCx: NG x 5 days 5/25 BCx: NG x 2 days 5/19 UCx: no growth  5/18 MRSA PCR: negative 5/17 SARS-CoV-2: negative  Thank you for allowing pharmacy to be a part of this patient's care.  Pernell Dupre, PharmD, BCPS Clinical Pharmacist 04/22/2019 7:02 AM

## 2019-04-22 NOTE — Progress Notes (Addendum)
[name], male   DOB: 10/16/1966, 53 y.o.   MRN: [id number]   [facility] PROGRESS NOTE  Kenneth Copeland HYW:[id number] DOB: 12-31-1965 DOA: 04/08/2019 PCP: Patient, No Pcp Per  HPI/Subjective: Patient reports chest pain today, decreased p.o. intake.  Diarrhea improved.  No overnight incidents  Objective: Vitals:   04/22/19 0610 04/22/19 0746  BP: 105/85 100/71  Pulse: 95 96  Resp:  17  Temp: 98.4 F (36.9 C) 97.9 F (36.6 C)  SpO2: 95% 95%    Filed Weights   04/17/19 0538 04/18/19 0337 04/21/19 0515  Weight: 82.3 kg 82.8 kg 81.4 kg    ROS: Review of Systems  Constitutional: Negative for chills and fever.  Eyes: Negative for blurred vision.  Respiratory: Negative for cough and shortness of breath.   Cardiovascular: Negative for chest pain.  Gastrointestinal: Negative for abdominal pain, constipation, diarrhea, nausea and vomiting.  Genitourinary: Negative for dysuria.  Musculoskeletal: Positive for joint pain.  Neurological: Negative for dizziness and headaches.   Exam: Physical Exam  Constitutional: He is oriented to person, place, and time.  HENT:  Nose: No mucosal edema.  Mouth/Throat: No oropharyngeal exudate or posterior oropharyngeal edema.  Eyes: Pupils are equal, round, and reactive to light. Conjunctivae, EOM and lids are normal.  Neck: No JVD present. Carotid bruit is not present. No edema present. No thyroid mass and no thyromegaly present.  Cardiovascular: Regular rhythm, S1 normal and S2 normal. Exam reveals no gallop.  No murmur heard. Pulses:      Dorsalis pedis pulses are 2+ on the right side and 2+ on the left side.  Respiratory: No respiratory distress. He has decreased breath sounds. He has no wheezes. He has no rhonchi. He has no rales.  GI: Soft. Bowel sounds are normal. There is no abdominal tenderness.  Musculoskeletal:     Left shoulder: He exhibits tenderness. He exhibits no swelling.  Lymphadenopathy:    He has no  cervical adenopathy.  Neurological: He is alert and oriented to person, place, and time. No cranial nerve deficit.  Skin: Skin is warm. No rash noted. Nails show no clubbing.  Psychiatric: He has a normal mood and affect.      Data Reviewed: Basic Metabolic Panel: Recent Labs  Lab 04/16/19 1159 04/17/19 0537 04/18/19 0138 04/19/19 0457 04/20/19 4854 04/21/19 0523 04/22/19 0555  NA 135 134* 131* 134* 133*  --  134*  K 4.1 4.0 3.7 3.8 3.8  --  4.2  CL 96* 95* 94* 96* 95*  --  98  CO2 29 30 28 28 28   --  27  GLUCOSE 197* 99 171* 69* 100*  --  132*  BUN 11 12 10 13 12   --  10  CREATININE 0.59* 0.62 0.68 0.85 1.15 1.23 1.12  CALCIUM 8.1* 8.4* 7.9* 8.3* 8.2*  --  8.2*  MG 1.9  --  1.8 2.3 2.0  --   --   Cardiac Enzymes: No results for input(s): CKTOTAL, CKMB, CKMBINDEX, TROPONINI in the last 168 hours. BNP (last 3 results) Recent Labs    04/02/19 0909 04/08/19 0652  BNP 60.0 184.0*      CBG: Recent Labs  Lab 04/21/19 1208 04/21/19 1656 04/21/19 2109 04/22/19 0748 04/22/19 1123  GLUCAP 245* 169* 166* 109* 154*    Recent Results (from the past 240 hour(s))  CULTURE, BLOOD (ROUTINE X 2) w Reflex to ID Panel     Status: None   Collection Time: 04/13/19  1:05 PM  Result  Value Ref Range Status   Specimen Description BLOOD LEFT HAND  Final   Special Requests   Final    BOTTLES DRAWN AEROBIC AND ANAEROBIC Blood Culture adequate volume   Culture   Final    NO GROWTH 5 DAYS Performed at [facility], [address] [place], [place] [address]    Report Status 04/18/2019 FINAL  Final  CULTURE, BLOOD (ROUTINE X 2) w Reflex to ID Panel     Status: None   Collection Time: 04/13/19  1:40 PM  Result Value Ref Range Status   Specimen Description BLOOD BLOOD LEFT HAND  Final   Special Requests   Final    BOTTLES DRAWN AEROBIC AND ANAEROBIC Blood Culture adequate volume   Culture   Final    NO GROWTH 5 DAYS Performed at [facility], [address] [place], [place] [address]    Report Status 04/18/2019 FINAL  Final  CULTURE, BLOOD (ROUTINE X 2) w Reflex to ID Panel     Status: None   Collection Time: 04/16/19 11:59 AM  Result Value Ref Range Status   Specimen Description BLOOD [facility]  Final   Special Requests   Final    BOTTLES DRAWN AEROBIC AND ANAEROBIC Blood Culture adequate volume   Culture   Final    NO GROWTH 5 DAYS Performed at [facility], [address] [place], [place] [address]    Report Status 04/21/2019 FINAL  Final  CULTURE, BLOOD (ROUTINE X 2) w Reflex to ID Panel     Status: None   Collection Time: 04/16/19  1:09 PM  Result Value Ref Range Status   Specimen Description BLOOD LW  Final   Special Requests   Final    BOTTLES DRAWN AEROBIC AND ANAEROBIC Blood Culture adequate volume   Culture   Final    NO GROWTH 5 DAYS Performed at [facility], [address] [place], [place] [address]    Report Status 04/21/2019 FINAL  Final  C difficile quick scan w PCR reflex     Status: None   Collection Time: 04/21/19  3:44 PM  Result Value Ref Range Status   C Diff antigen NEGATIVE NEGATIVE Final   C Diff toxin NEGATIVE NEGATIVE Final   C Diff interpretation No C. difficile detected.  Final    Comment: Performed at [facility], [address] [place], [place] [address]  Gastrointestinal Panel by PCR , Stool     Status: None   Collection Time: 04/21/19  3:44 PM  Result Value Ref Range Status   Campylobacter species NOT DETECTED NOT DETECTED Final   Plesimonas shigelloides NOT DETECTED NOT DETECTED Final   Salmonella species NOT DETECTED NOT DETECTED Final   Yersinia enterocolitica NOT DETECTED NOT DETECTED Final   Vibrio species NOT DETECTED NOT DETECTED Final   Vibrio cholerae NOT DETECTED NOT DETECTED Final   Enteroaggregative E coli (EAEC) NOT DETECTED NOT DETECTED Final   Enteropathogenic E coli (EPEC) NOT DETECTED NOT DETECTED Final   Enterotoxigenic E coli (ETEC) NOT DETECTED NOT  DETECTED Final   Shiga like toxin producing E coli (STEC) NOT DETECTED NOT DETECTED Final   Shigella/Enteroinvasive E coli (EIEC) NOT DETECTED NOT DETECTED Final   Cryptosporidium NOT DETECTED NOT DETECTED Final   Cyclospora cayetanensis NOT DETECTED NOT DETECTED Final   Entamoeba histolytica NOT DETECTED NOT DETECTED Final   Giardia lamblia NOT DETECTED NOT DETECTED Final   Adenovirus F40/41 NOT DETECTED NOT DETECTED Final  Astrovirus NOT DETECTED NOT DETECTED Final   Norovirus GI/GII NOT DETECTED NOT DETECTED Final   Rotavirus A NOT DETECTED NOT DETECTED Final   Sapovirus (I, II, IV, and V) NOT DETECTED NOT DETECTED Final    Comment: Performed at [facility], [address], [place], [place] [address]     Studies: No results found.  Scheduled Meds: . amiodarone  200 mg Oral BID  . colchicine  0.6 mg Oral BID  . dicyclomine  10 mg Oral QID  . DULoxetine  30 mg Oral Daily  . enoxaparin (LOVENOX) injection  40 mg Subcutaneous Daily  . feeding supplement (ENSURE ENLIVE)  237 mL Oral BID BM  . insulin aspart  0-20 Units Subcutaneous TID WC  . insulin aspart  0-5 Units Subcutaneous QHS  . insulin aspart  5 Units Subcutaneous TID WC  . insulin glargine  20 Units Subcutaneous BID  . meloxicam  7.5 mg Oral Daily  . metoCLOPramide  10 mg Oral TID AC & HS  . multivitamin with minerals  1 tablet Oral Daily  . neomycin-polymyxin b-dexamethasone  1 drop Left Eye Q6H  . oxyCODONE  15 mg Oral Q12H  . pantoprazole  40 mg Oral BID  . polyvinyl alcohol  2 drop Left Eye TID  . sodium chloride flush  10-40 mL Intracatheter Q12H  . sucralfate  1 g Oral QID  . tamsulosin  0.4 mg Oral Daily   Continuous Infusions: . sodium chloride Stopped (04/22/19 0141)  . sodium chloride 75 mL/hr at 04/22/19 0555  . vancomycin 1,000 mg (04/22/19 1048)    Assessment/Plan:  Chest pain We will monitor patient on telemetry.  EKG with the specific ST-T wave changes and sinus tachycardia left axis  deviation.  Morphine as needed We will let cardiology know  1. MRSA bacteremia.  Patient seen by infectious disease  Dr. Levester Fresh patient on IV vancomycin.  Last positive blood culture 04/12/2019.  TEE negative.  ID discussed with the Dr. Saralyn Pilar regarding pericarditis, from cardiology standpoint  pericardial centesis would not change length of therapy so this was deferred.  Procedure is risky in his case.  patient has a PICC line.  Social worker and care management team looking into options about disposition.  Potential rehab for continued antibiotic therapy.  Rehab may not happen until June 1.  Plan is to continue vancomycin now with close monitoring of the renal function and if renal function is getting worse plan is to switch him back to daptomycin 2. Left shoulder pain and left upper chest pain.  Shoulder pain may be referred pain patient does have some degenerative changes. 3. Pericardial effusion.  No tamponade seen.  My associate spoke with cardiothoracic surgery at [facility] and the patient does not need a pericardial window.  4. Atrial fibrillation.  Now on oral amiodarone 5. COPD.  Continue nebulizer treatments. 6. Chronic pain and neuropathy continue gabapentin and Cymbalta and OxyContin dose increased and oxycodone.  7. Type 2 diabetes mellitus without complication on sliding scale and glargine insulin.  Patient sugar was on the lower side this morning so I cut back glargine insulin to 20 units twice daily.  Hemoglobin A1c elevated at 10.5. 8. Diarrhea  antibiotic induced rule out C. difficile colitis.  Stool for C. difficile toxin is negative.  GI panel negative diarrhea resolved hydrated with IV fluids 9. Disposition based on PT assessment-rehab.  Await placementAnticipate on Monday  Code Status:     Code Status Orders  (From  admission, onward)         Start     Ordered   04/08/19 1225  Full code  Continuous     04/08/19 1224        Code Status History    Date Active  Date Inactive Code Status Order ID Comments User Context   04/02/2019 1332 04/05/2019 1916 Full Code [id number]  Lang Snow, NP ED      Disposition Plan: Disposition -to skilled nursing facility in a.m. patient's insurance will be kicked in from June 1 Consultants:  Infectious disease  Cardiology  Orthopedic surgery  Procedures:  TEE  Antibiotics:  Vancomycin  Time spent:33 minutes  [place]

## 2019-04-23 LAB — CREATININE, SERUM
Creatinine, Ser: 1.21 mg/dL (ref 0.61–1.24)
GFR calc Af Amer: >60 mL/min (ref >60)
GFR calc non Af Amer: >60 mL/min (ref >60)

## 2019-04-23 LAB — GLUCOSE, CAPILLARY
Glucose-Capillary: 109 mg/dL — ABNORMAL HIGH (ref 70–99)
Glucose-Capillary: 187 mg/dL — ABNORMAL HIGH (ref 70–99)
Glucose-Capillary: 70 mg/dL (ref 70–99)
Glucose-Capillary: 90 mg/dL (ref 70–99)

## 2019-04-23 LAB — SARS CORONAVIRUS 2 BY RT PCR (HOSPITAL ORDER, PERFORMED IN ~~LOC~~ HOSPITAL LAB): SARS Coronavirus 2: NEGATIVE

## 2019-04-23 MED ORDER — CALCIUM CARBONATE ANTACID 500 MG PO CHEW
1.0000 | CHEWABLE_TABLET | Freq: Three times a day (TID) | ORAL | Status: DC | PRN
  Administered 2019-04-23: 200 mg via ORAL
  Filled 2019-04-23: qty 1

## 2019-04-23 NOTE — Progress Notes (Signed)
Patient Name: Kenneth Copeland Date of Encounter: 04/23/2019  Hospital Problem List     Active Problems:   Chest pain   Acute respiratory failure [facility])    Patient Profile     Patient admitted with chest pain and bacteremia.  Subjective   Chest pain yesterday afternoon.  Asymptomatic this morning.  Inpatient Medications    . amiodarone  200 mg Oral BID  . colchicine  0.6 mg Oral BID  . dicyclomine  10 mg Oral QID  . DULoxetine  30 mg Oral Daily  . enoxaparin (LOVENOX) injection  40 mg Subcutaneous Daily  . feeding supplement (ENSURE ENLIVE)  237 mL Oral BID BM  . insulin aspart  0-20 Units Subcutaneous TID WC  . insulin aspart  0-5 Units Subcutaneous QHS  . insulin aspart  5 Units Subcutaneous TID WC  . insulin glargine  20 Units Subcutaneous BID  . meloxicam  7.5 mg Oral Daily  . metoCLOPramide  10 mg Oral TID AC & HS  . multivitamin with minerals  1 tablet Oral Daily  . neomycin-polymyxin b-dexamethasone  1 drop Left Eye Q6H  . oxyCODONE  15 mg Oral Q12H  . pantoprazole  40 mg Oral BID  . polyvinyl alcohol  2 drop Left Eye TID  . sodium chloride flush  10-40 mL Intracatheter Q12H  . sucralfate  1 g Oral QID  . tamsulosin  0.4 mg Oral Daily    Vital Signs    Vitals:   04/22/19 1402 04/22/19 1718 04/22/19 2015 04/23/19 0445  BP: 101/75 92/74 92/66  100/72  Pulse: (!) 104 (!) 104 97 (!) 104  Resp: 20  16 16   Temp:  98.1 F (36.7 C) 98.6 F (37 C) 99.4 F (37.4 C)  TempSrc:  Oral Oral Oral  SpO2: 97% 95% 96% 96%  Weight:    82.4 kg  Height:        Intake/Output Summary (Last 24 hours) at 04/23/2019 0732 Last data filed at 04/23/2019 0142 Gross per 24 hour  Intake 429.27 ml  Output 1150 ml  Net -720.73 ml   Filed Weights   04/18/19 0337 04/21/19 0515 04/23/19 0445  Weight: 82.8 kg 81.4 kg 82.4 kg    Physical Exam    GEN: Well nourished, well developed, in no acute distress.  HEENT: normal.  Neck: Supple, no JVD, carotid bruits, or masses. Cardiac:  RRR, no murmurs, rubs, or gallops. No clubbing, cyanosis, edema.  Radials/DP/PT 2+ and equal bilaterally.  Respiratory:  Respirations regular and unlabored, clear to auscultation bilaterally. GI: Soft, nontender, nondistended, BS + x 4. MS: no deformity or atrophy. Skin: warm and dry, no rash. Neuro:  Strength and sensation are intact. Psych: Normal affect.  Labs    CBC No results for input(s): WBC, NEUTROABS, HGB, HCT, MCV, PLT in the last 72 hours. Basic Metabolic Panel Recent Labs    04/22/19 0555 04/23/19 0617  NA 134*  --   K 4.2  --   CL 98  --   CO2 27  --   GLUCOSE 132*  --   BUN 10  --   CREATININE 1.12 1.21  CALCIUM 8.2*  --    Liver Function Tests No results for input(s): AST, ALT, ALKPHOS, BILITOT, PROT, ALBUMIN in the last 72 hours. No results for input(s): LIPASE, AMYLASE in the last 72 hours. Cardiac Enzymes Recent Labs    04/22/19 1430  TROPONINI 0.03*   BNP No results for input(s): BNP in the last 72 hours.  D-Dimer No results for input(s): DDIMER in the last 72 hours. Hemoglobin A1C No results for input(s): HGBA1C in the last 72 hours. Fasting Lipid Panel No results for input(s): CHOL, HDL, LDLCALC, TRIG, CHOLHDL, LDLDIRECT in the last 72 hours. Thyroid Function Tests No results for input(s): TSH, T4TOTAL, T3FREE, THYROIDAB in the last 72 hours.  Invalid input(s): FREET3  Telemetry    Normal sinus rhythm  ECG    Normal sinus rhythm with no ischemia  Radiology    Ct Angio Chest Pe W And/or Wo Contrast  Result Date: 04/02/2019 CLINICAL DATA:  Shortness of breath, chest pain. EXAM: CT ANGIOGRAPHY CHEST WITH CONTRAST TECHNIQUE: Multidetector CT imaging of the chest was performed using the standard protocol during bolus administration of intravenous contrast. Multiplanar CT image reconstructions and MIPs were obtained to evaluate the vascular anatomy. CONTRAST:  64mL OMNIPAQUE IOHEXOL 350 MG/ML SOLN COMPARISON:  Radiograph of same day. FINDINGS:  Cardiovascular: Satisfactory opacification of the pulmonary arteries to the segmental level. No evidence of pulmonary embolism. Normal heart size. Mild to moderate size pericardial effusion is noted. There is no evidence of thoracic aortic dissection or aneurysm. Coronary artery calcifications are noted. Mediastinum/Nodes: No enlarged mediastinal, hilar, or axillary lymph nodes. Thyroid gland, trachea, and esophagus demonstrate no significant findings. Lungs/Pleura: No pneumothorax is noted. Minimal pleural effusions are noted with adjacent subsegmental atelectasis. Upper Abdomen: No acute abnormality. Musculoskeletal: No chest wall abnormality. No acute or significant osseous findings. Review of the MIP images confirms the above findings. IMPRESSION: No definite evidence of pulmonary embolus. Mild to moderate size pericardial effusion is noted. Minimal bilateral pleural effusions are noted with adjacent subsegmental atelectasis. Electronically Signed   By: Marijo Conception M.D.   On: 04/02/2019 10:28   Mr Cervical Spine Wo Contrast  Result Date: 04/03/2019 CLINICAL DATA:  Shoulder, neck, and chest pain EXAM: MRI CERVICAL SPINE WITHOUT CONTRAST TECHNIQUE: Multiplanar, multisequence MR imaging of the cervical spine was performed. No intravenous contrast was administered. COMPARISON:  None. FINDINGS: Alignment: Loss of the normal cervical lordosis, which can be associated with muscle spasm. Vertebrae: Degenerative facet arthropathy on the left at C2-3 with associated facet edema. Cord: No significant abnormal spinal cord signal is observed. Posterior Fossa, vertebral arteries, paraspinal tissues: Unremarkable Disc levels: C2-3: Mild left foraminal impingement due to uncinate spurring and facet arthropathy. C3-4: No impingement. Small central disc protrusion. Uncinate spurring. C4-5: Mild right foraminal stenosis due to uncinate spurring. C5-6: Moderate to prominent right foraminal stenosis and mild to moderate  right eccentric central narrowing of the thecal sac due to right paracentral disc osteophyte complex, disc bulge, and uncinate spurring. C6-7: No impingement.  Right paracentral disc protrusion. C7-T1: Unremarkable. IMPRESSION: 1. Cervical spondylosis and degenerative disc disease causing moderate to prominent impingement at C5-6; and mild impingement at C2-3 and C4-5, as detailed above. Electronically Signed   By: Van Clines M.D.   On: 04/03/2019 13:45   Mr Thoracic Spine W Wo Contrast  Result Date: 04/12/2019 CLINICAL DATA:  Bacteremia. EXAM: MRI THORACIC AND LUMBAR SPINE WITHOUT AND WITH CONTRAST TECHNIQUE: Multiplanar and multiecho pulse sequences of the thoracic and lumbar spine were obtained without and with intravenous contrast. CONTRAST:  8 mL Gadavist COMPARISON:  Chest CTA 04/02/2019 FINDINGS: MRI THORACIC SPINE FINDINGS Alignment:  Normal. Vertebrae: No fracture, suspicious osseous lesion, or significant marrow edema. No evidence of discitis. T4 superior endplate Schmorl's node. Cord: Normal signal and morphology. No abnormal intradural enhancement. No epidural fluid collection. Paraspinal and other soft tissues: Bilateral  pleural effusions and partially visualized pericardial effusion as seen on prior CT. Disc levels: Mild multilevel disc bulging primarily in the midthoracic spine. Small left paracentral disc protrusion at T8-9. No spinal stenosis or cord compression. Multilevel facet arthrosis with spurring most notable on the right at T9-10 and T10-11. No evidence of compressive neural foraminal stenosis. MRI LUMBAR SPINE FINDINGS Mild motion artifact. Segmentation: Standard. Alignment:  At most trace retrolisthesis of L2 on L3 and L3 on L4. Vertebrae: No lumbar spine fracture, suspicious osseous lesion, or evidence of discitis. Small a Mangione in the L4 vertebral body. Partial imaging of a small curvilinear low signal intensity focus in the left sacral ala without associated edema to  indicate a recent fracture. Conus medullaris: Extends to the T12 level and appears normal. Paraspinal and other soft tissues: No paraspinal fluid collection or evidence of other acute abnormality. Disc levels: T12-L1 and L1-2: Negative. L2-3: Disc desiccation and mild disc space narrowing. Mild disc bulging without stenosis. L3-4: Disc desiccation.  Mild disc bulging without stenosis. L4-5: Disc desiccation. Mild disc bulging and mild facet hypertrophy result in minimal right and mild left neural foraminal stenosis without spinal stenosis. L5-S1: Minimal disc bulging and mild facet hypertrophy result in mild left neural foraminal stenosis without spinal stenosis. IMPRESSION: 1. No evidence of infection in the thoracic and lumbar spine. 2. Mild thoracic spondylosis without stenosis. 3. Mild lumbar spondylosis with mild neural foraminal stenosis at L4-5 and L5-S1. No spinal stenosis. Electronically Signed   By: Logan Bores M.D.   On: 04/12/2019 12:40   Mr Lumbar Spine W Wo Contrast  Result Date: 04/12/2019 CLINICAL DATA:  Bacteremia. EXAM: MRI THORACIC AND LUMBAR SPINE WITHOUT AND WITH CONTRAST TECHNIQUE: Multiplanar and multiecho pulse sequences of the thoracic and lumbar spine were obtained without and with intravenous contrast. CONTRAST:  8 mL Gadavist COMPARISON:  Chest CTA 04/02/2019 FINDINGS: MRI THORACIC SPINE FINDINGS Alignment:  Normal. Vertebrae: No fracture, suspicious osseous lesion, or significant marrow edema. No evidence of discitis. T4 superior endplate Schmorl's node. Cord: Normal signal and morphology. No abnormal intradural enhancement. No epidural fluid collection. Paraspinal and other soft tissues: Bilateral pleural effusions and partially visualized pericardial effusion as seen on prior CT. Disc levels: Mild multilevel disc bulging primarily in the midthoracic spine. Small left paracentral disc protrusion at T8-9. No spinal stenosis or cord compression. Multilevel facet arthrosis with  spurring most notable on the right at T9-10 and T10-11. No evidence of compressive neural foraminal stenosis. MRI LUMBAR SPINE FINDINGS Mild motion artifact. Segmentation: Standard. Alignment:  At most trace retrolisthesis of L2 on L3 and L3 on L4. Vertebrae: No lumbar spine fracture, suspicious osseous lesion, or evidence of discitis. Small a Mangione in the L4 vertebral body. Partial imaging of a small curvilinear low signal intensity focus in the left sacral ala without associated edema to indicate a recent fracture. Conus medullaris: Extends to the T12 level and appears normal. Paraspinal and other soft tissues: No paraspinal fluid collection or evidence of other acute abnormality. Disc levels: T12-L1 and L1-2: Negative. L2-3: Disc desiccation and mild disc space narrowing. Mild disc bulging without stenosis. L3-4: Disc desiccation.  Mild disc bulging without stenosis. L4-5: Disc desiccation. Mild disc bulging and mild facet hypertrophy result in minimal right and mild left neural foraminal stenosis without spinal stenosis. L5-S1: Minimal disc bulging and mild facet hypertrophy result in mild left neural foraminal stenosis without spinal stenosis. IMPRESSION: 1. No evidence of infection in the thoracic and lumbar spine. 2. Mild thoracic  spondylosis without stenosis. 3. Mild lumbar spondylosis with mild neural foraminal stenosis at L4-5 and L5-S1. No spinal stenosis. Electronically Signed   By: Logan Bores M.D.   On: 04/12/2019 12:40   Ct Abdomen Pelvis W Contrast  Result Date: 04/13/2019 CLINICAL DATA:  Persistent bacteremia. Evaluate for urinary, prostate or hepatic source. EXAM: CT ABDOMEN AND PELVIS WITH CONTRAST TECHNIQUE: Multidetector CT imaging of the abdomen and pelvis was performed using the standard protocol following bolus administration of intravenous contrast. CONTRAST:  131mL OMNIPAQUE IOHEXOL 300 MG/ML  SOLN COMPARISON:  Chest CT 04/02/2019. FINDINGS: Lower chest: Similar appearance of small  pericardial effusion although this shows peripheral rim enhancement on the current study. Small bilateral pleural effusions are associated with bilateral lower lung collapse/consolidation. Hepatobiliary: No suspicious focal abnormality within the liver parenchyma. Surgical defect noted liver parenchyma near the junction of the right and left hepatic lobes. Gallbladder surgically absent. No intrahepatic or extrahepatic biliary dilation. Pancreas: Hypoenhancement is identified in the tail the pancreas, with low-density material extending into the splenic hilum and anteriorly along the posterior wall the stomach. This is well appreciated on images 20 4-26 of series 2. Pancreatitis/pancreatic necrosis could have this appearance. Pancreatic adenocarcinoma cannot be excluded. Spleen: No splenomegaly. No focal mass lesion. Adrenals/Urinary Tract: No adrenal nodule or mass. Kidneys unremarkable. No evidence for hydroureter. The urinary bladder appears normal for the degree of distention. Stomach/Bowel: Stomach is unremarkable. No gastric wall thickening. No evidence of outlet obstruction. Duodenum is normally positioned as is the ligament of Treitz. No small bowel wall thickening. No small bowel dilatation. The terminal ileum is normal. The appendix is not visualized, but there is no edema or inflammation in the region of the cecum. No gross colonic mass. No colonic wall thickening. Vascular/Lymphatic: There is abdominal aortic atherosclerosis without aneurysm. There is no gastrohepatic or hepatoduodenal ligament lymphadenopathy. No intraperitoneal or retroperitoneal lymphadenopathy. No pelvic sidewall lymphadenopathy. Reproductive: The prostate gland and seminal vesicles are unremarkable. No evidence for prostatic fluid collection to suggest abscess. Other: Trace free fluid noted in the pelvis. Musculoskeletal: Diffuse body wall edema noted. IMPRESSION: 1. The small pericardial effusion persists with interval development  of rim enhancement. Peripheral enhancement suggests complexity and may be related to hemorrhage, inflammation, or infection. 2. Low density identified in the tail of pancreas extending into the splenic hilum and anteriorly towards the posterior wall the stomach. Edema from pancreatitis is a concern and lack of enhancing pancreatic parenchyma raises the question of pancreatic necrosis. This amorphous low-density in the pancreatic tail measures higher than water attenuation and hypoenhancing adenocarcinoma is also consideration. Endoscopic ultrasound may prove helpful to further evaluate. 3. No evidence for hepatic, renal, or prostatic abscess. 4. Bilateral lower lobe collapse/consolidation with small bilateral pleural effusions. 5.  Aortic Atherosclerois (ICD10-170.0) Electronically Signed   By: Misty Stanley M.D.   On: 04/13/2019 16:38   Mr Shoulder Left Wo Contrast  Result Date: 04/17/2019 CLINICAL DATA:  Left shoulder pain.  Sepsis. EXAM: MRI OF THE LEFT SHOULDER WITHOUT CONTRAST TECHNIQUE: Multiplanar, multisequence MR imaging of the shoulder was performed. No intravenous contrast was administered. COMPARISON:  None. FINDINGS: Rotator cuff:  Intact without significant tendinosis. Muscles:  No focal muscular atrophy or edema. Biceps long head: Intact and normally positioned. Mild tendinosis of the intra-articular portion. Acromioclavicular Joint: The acromion is type 1. There are mild acromioclavicular degenerative changes. No significant fluid is present in the subacromial - subdeltoid bursa. Glenohumeral Joint: Mild glenohumeral degenerative changes. No significant shoulder joint effusion. Labrum:  Mild superior labral degeneration without evidence of tear or paralabral cyst. Bones: No acute or significant extra-articular osseous findings. Other: There are no periarticular fluid collections or inflammatory changes. IMPRESSION: 1. No acute findings.  No evidence of septic joint or osteomyelitis. 2. The rotator  cuff, biceps tendon and labrum are intact. There is mild bicipital tendinosis and superior labral degeneration. Electronically Signed   By: Richardean Sale M.D.   On: 04/17/2019 13:22   Dg Chest Port 1 View  Result Date: 04/10/2019 CLINICAL DATA:  Acute respiratory failure EXAM: PORTABLE CHEST 1 VIEW COMPARISON:  Two days ago FINDINGS: Cardiopericardial enlargement. New PICC with tip at the upper right atrium. Small pleural effusions and atelectasis. No evidence of pulmonary edema. IMPRESSION: 1. Stable cardiopericardial enlargement in this patient with pericardial effusion by recent CT. 2. Small pleural effusions with atelectasis. Electronically Signed   By: Monte Fantasia M.D.   On: 04/10/2019 05:02   Dg Chest Portable 1 View  Result Date: 04/08/2019 CLINICAL DATA:  Weakness, pericardial effusion, history of liver cancer EXAM: PORTABLE CHEST 1 VIEW COMPARISON:  CTA chest dated 04/02/2019 FINDINGS: Mild bibasilar atelectasis with trace bilateral pleural effusions. No frank interstitial edema. No pneumothorax. Enlargement of the cardiac silhouette, corresponding to known pericardial effusion. IMPRESSION: Trace bilateral pleural effusions. Known pericardial effusion. Electronically Signed   By: Julian Hy M.D.   On: 04/08/2019 07:21   Dg Chest Port 1 View  Result Date: 04/02/2019 CLINICAL DATA:  Acute shortness of breath and chest pain for several weeks. EXAM: PORTABLE CHEST 1 VIEW COMPARISON:  None. FINDINGS: UPPER limits normal heart size noted. This is a low volume film with mild bibasilar atelectasis. There may be trace bilateral pleural effusions present. No definite airspace disease or pneumothorax. No acute bony abnormalities. IMPRESSION: Low volume film with mild bibasilar atelectasis. UPPER limits normal heart size with possible trace bilateral pleural effusions. Electronically Signed   By: Margarette Canada M.D.   On: 04/02/2019 09:41   [place] Ekg Site Rite  Result Date: 04/09/2019 If Site  Rite image not attached, placement could not be confirmed due to current cardiac rhythm.   Assessment & Plan    Patient with history of chronic chest pain, pericardial effusion, bacteremia with negative transesophageal echo who developed an episode of severe chest pain last p.m.  This is similar to what he has had previously during this admission however much more severe than the last several days.  Electrocardiogram done during the event showed no appreciable change.  Troponin was 0.03.  This a.m. pain is improved.  He was treated with morphine.  Dates the pain awoke from sleep.  Paracardial effusion-continue to treat medically and continue with colchicine 0.6 mg twice daily.  In discussion with cardiothoracic surgery and interdepartmental discussion, patient not a candidate for pericardiocentesis or surgical pericardial window.  Atrial fibrillation-on amiodarone 200 mg twice daily.  We will continue with this.  Bacteremia-appreciate infectious disease input.  Continue with antibiotics.  Signed, Javier Docker Janina Trafton MD 04/23/2019, 7:32 AM  Pager: [phone]

## 2019-04-23 NOTE — TOC Progression Note (Signed)
Transition of Care [facility]) - Progression Note    Patient Details  Name: Kenneth Copeland MRN: [id number] Date of Birth: 02-11-1966  [facility]) CM/SW Contact  Elza Rafter, RN Phone Number: 04/23/2019, 10:49 AM  Clinical Narrative:   Patient's [employer] Medicare is supposed to become effective today.  [place] has started authorization this morning.  Will continue to follow.      Expected Discharge Plan: [place] Barriers to Discharge: Continued Medical Work up  Expected Discharge Plan and Services Expected Discharge Plan: [place]   Discharge Planning Services: CM Consult Post Acute Care Choice: [place] arrangements for the past 2 months: Apartment Expected Discharge Date: 04/10/19                         HH Arranged: RN, IV Antibiotics HH Agency: [place] ([employer]) Date HH Agency Contacted: 04/12/19 Time [place]: [id number] Representative spoke with at [place]: [email or web address]   Social Determinants of Health ([place]) Interventions    Readmission Risk Interventions Readmission Risk Prevention Plan 04/12/2019  Transportation Screening Complete  Home Care Screening Complete  Medication Review (RN CM) Complete

## 2019-04-23 NOTE — Progress Notes (Signed)
[name], male   DOB: 07-Dec-1965, 53 y.o.   MRN: [id number]   [facility] PROGRESS NOTE  Kenneth Copeland Kenneth Copeland DOB: July 30, 1966 Kenneth Copeland: 04/08/2019 PCP: Kenneth Copeland  HPI/Subjective: Patient is doing fine.  Denies any chest pain or shortness of breath no overnight incidents  Objective: Vitals:   04/23/19 0445 04/23/19 1022  BP: 100/72 90/69  Pulse: (!) 104 (!) 101  Resp: 16 19  Temp: 99.4 F (37.4 C)   SpO2: 96% 96%    Filed Weights   04/18/19 0337 04/21/19 0515 04/23/19 0445  Weight: 82.8 kg 81.4 kg 82.4 kg    ROS: Review of Systems  Constitutional: Negative for chills and fever.  Eyes: Negative for blurred vision.  Respiratory: Negative for cough and shortness of breath.   Cardiovascular: Negative for chest pain.  Gastrointestinal: Negative for abdominal pain, constipation, diarrhea, nausea and vomiting.  Genitourinary: Negative for dysuria.  Musculoskeletal: Negative for joint pain.  Neurological: Negative for dizziness and headaches.   Exam: Physical Exam  Constitutional: He is oriented to person, place, and time.  HENT:  Nose: No mucosal edema.  Mouth/Throat: No oropharyngeal exudate or posterior oropharyngeal edema.  Eyes: Pupils are equal, round, and reactive to light. Conjunctivae, EOM and lids are normal.  Neck: No JVD present. Carotid bruit is not present. No edema present. No thyroid mass and no thyromegaly present.  Cardiovascular: Regular rhythm, S1 normal and S2 normal. Exam reveals no gallop.  No murmur heard. Pulses:      Dorsalis pedis pulses are 2+ on the right side and 2+ on the left side.  Respiratory: No respiratory distress. He has decreased breath sounds. He has no wheezes. He has no rhonchi. He has no rales.  GI: Soft. Bowel sounds are normal. There is no abdominal tenderness.  Musculoskeletal:     Left shoulder: He exhibits tenderness. He exhibits no swelling.  Lymphadenopathy:    He has no cervical  adenopathy.  Neurological: He is alert and oriented to person, place, and time. No cranial nerve deficit.  Skin: Skin is warm. No rash noted. Nails show no clubbing.  Psychiatric: He has a normal mood and affect.      Data Reviewed: Basic Metabolic Panel: Recent Labs  Lab 04/17/19 0537 04/18/19 0138 04/19/19 0457 04/20/19 2355 04/21/19 0523 04/22/19 0555 04/23/19 0617  NA 134* 131* 134* 133*  --  134*  --   K 4.0 3.7 3.8 3.8  --  4.2  --   CL 95* 94* 96* 95*  --  98  --   CO2 30 28 28 28   --  27  --   GLUCOSE 99 171* 69* 100*  --  132*  --   BUN 12 10 13 12   --  10  --   CREATININE 0.62 0.68 0.85 1.15 1.23 1.12 1.21  CALCIUM 8.4* 7.9* 8.3* 8.2*  --  8.2*  --   MG  --  1.8 2.3 2.0  --   --   --   Cardiac Enzymes: Recent Labs  Lab 04/22/19 1430  TROPONINI 0.03*   BNP (last 3 results) Recent Labs    04/02/19 0909 04/08/19 0652  BNP 60.0 184.0*      CBG: Recent Labs  Lab 04/22/19 1123 04/22/19 1716 04/22/19 2137 04/23/19 0805 04/23/19 1157  GLUCAP 154* 159* 109* 109* 187*    Recent Results (from the past 240 hour(s))  CULTURE, BLOOD (ROUTINE X 2) w Reflex to ID Panel  Status: None   Collection Time: 04/16/19 11:59 AM  Result Value Ref Range Status   Specimen Description BLOOD [facility]  Final   Special Requests   Final    BOTTLES DRAWN AEROBIC AND ANAEROBIC Blood Culture adequate volume   Culture   Final    NO GROWTH 5 DAYS Performed at [facility], [address] [place], [place] [address]    Report Status 04/21/2019 FINAL  Final  CULTURE, BLOOD (ROUTINE X 2) w Reflex to ID Panel     Status: None   Collection Time: 04/16/19  1:09 PM  Result Value Ref Range Status   Specimen Description BLOOD LW  Final   Special Requests   Final    BOTTLES DRAWN AEROBIC AND ANAEROBIC Blood Culture adequate volume   Culture   Final    NO GROWTH 5 DAYS Performed at [facility], [address] [place], [place] [address]    Report Status  04/21/2019 FINAL  Final  C difficile quick scan w PCR reflex     Status: None   Collection Time: 04/21/19  3:44 PM  Result Value Ref Range Status   C Diff antigen NEGATIVE NEGATIVE Final   C Diff toxin NEGATIVE NEGATIVE Final   C Diff interpretation No C. difficile detected.  Final    Comment: Performed at [facility], [address] [place], [place] [address]  Gastrointestinal Panel by PCR , Stool     Status: None   Collection Time: 04/21/19  3:44 PM  Result Value Ref Range Status   Campylobacter species NOT DETECTED NOT DETECTED Final   Plesimonas shigelloides NOT DETECTED NOT DETECTED Final   Salmonella species NOT DETECTED NOT DETECTED Final   Yersinia enterocolitica NOT DETECTED NOT DETECTED Final   Vibrio species NOT DETECTED NOT DETECTED Final   Vibrio cholerae NOT DETECTED NOT DETECTED Final   Enteroaggregative E coli (EAEC) NOT DETECTED NOT DETECTED Final   Enteropathogenic E coli (EPEC) NOT DETECTED NOT DETECTED Final   Enterotoxigenic E coli (ETEC) NOT DETECTED NOT DETECTED Final   Shiga like toxin producing E coli (STEC) NOT DETECTED NOT DETECTED Final   Shigella/Enteroinvasive E coli (EIEC) NOT DETECTED NOT DETECTED Final   Cryptosporidium NOT DETECTED NOT DETECTED Final   Cyclospora cayetanensis NOT DETECTED NOT DETECTED Final   Entamoeba histolytica NOT DETECTED NOT DETECTED Final   Giardia lamblia NOT DETECTED NOT DETECTED Final   Adenovirus F40/41 NOT DETECTED NOT DETECTED Final   Astrovirus NOT DETECTED NOT DETECTED Final   Norovirus GI/GII NOT DETECTED NOT DETECTED Final   Rotavirus A NOT DETECTED NOT DETECTED Final   Sapovirus (I, II, IV, and V) NOT DETECTED NOT DETECTED Final    Comment: Performed at [facility], [address], [place], [place] [address]  SARS Coronavirus 2 (CEPHEID - Performed in [place] hospital lab), Hosp Order     Status: None   Collection Time: 04/23/19 12:38 PM  Result Value Ref Range Status   SARS Coronavirus  2 NEGATIVE NEGATIVE Final    Comment: (NOTE) If result is NEGATIVE SARS-CoV-2 target nucleic acids are NOT DETECTED. The SARS-CoV-2 RNA is generally detectable in upper and lower  respiratory specimens during the acute phase of infection. The lowest  concentration of SARS-CoV-2 viral copies this assay can detect is 250  copies / mL. A negative result does not preclude SARS-CoV-2 infection  and should not be used as the sole basis for treatment or other  patient management decisions.  A negative result may occur with  improper specimen collection / handling, submission of specimen other  than nasopharyngeal swab, presence of viral mutation(s) within the  areas targeted by this assay, and inadequate number of viral copies  (<250 copies / mL). A negative result must be combined with clinical  observations, patient history, and epidemiological information. If result is POSITIVE SARS-CoV-2 target nucleic acids are DETECTED. The SARS-CoV-2 RNA is generally detectable in upper and lower  respiratory specimens dur ing the acute phase of infection.  Positive  results are indicative of active infection with SARS-CoV-2.  Clinical  correlation with patient history and other diagnostic information is  necessary to determine patient infection status.  Positive results do  not rule out bacterial infection or co-infection with other viruses. If result is PRESUMPTIVE POSTIVE SARS-CoV-2 nucleic acids MAY BE PRESENT.   A presumptive positive result was obtained on the submitted specimen  and confirmed on repeat testing.  While 2019 novel coronavirus  (SARS-CoV-2) nucleic acids may be present in the submitted sample  additional confirmatory testing may be necessary for epidemiological  and / or clinical management purposes  to differentiate between  SARS-CoV-2 and other Sarbecovirus currently known to infect humans.  If clinically indicated additional testing with an alternate test  methodology  [id number]) is advised. The SARS-CoV-2 RNA is generally  detectable in upper and lower respiratory sp ecimens during the acute  phase of infection. The expected result is Negative. Fact Sheet for Patients:  StrictlyIdeas.no Fact Sheet for Healthcare Providers: BankingDealers.co.za This test is not yet approved or cleared by the [place] FDA and has been authorized for detection and/or diagnosis of SARS-CoV-2 by FDA under an Emergency Use Authorization (EUA).  This EUA will remain in effect (meaning this test can be used) for the duration of the COVID-19 declaration under Section 564(b)(1) of the Act, 21 U.S.C. section 360bbb-3(b)(1), unless the authorization is terminated or revoked sooner. Performed at [facility], [address], [place], [place] [address]      Studies: No results found.  Scheduled Meds: . amiodarone  200 mg Oral BID  . colchicine  0.6 mg Oral BID  . dicyclomine  10 mg Oral QID  . DULoxetine  30 mg Oral Daily  . enoxaparin (LOVENOX) injection  40 mg Subcutaneous Daily  . feeding supplement (ENSURE ENLIVE)  237 mL Oral BID BM  . insulin aspart  0-20 Units Subcutaneous TID WC  . insulin aspart  0-5 Units Subcutaneous QHS  . insulin aspart  5 Units Subcutaneous TID WC  . insulin glargine  20 Units Subcutaneous BID  . meloxicam  7.5 mg Oral Daily  . metoCLOPramide  10 mg Oral TID AC & HS  . multivitamin with minerals  1 tablet Oral Daily  . neomycin-polymyxin b-dexamethasone  1 drop Left Eye Q6H  . oxyCODONE  15 mg Oral Q12H  . pantoprazole  40 mg Oral BID  . polyvinyl alcohol  2 drop Left Eye TID  . sodium chloride flush  10-40 mL Intracatheter Q12H  . sucralfate  1 g Oral QID  . tamsulosin  0.4 mg Oral Daily   Continuous Infusions: . sodium chloride Stopped (04/23/19 0135)  . sodium chloride    . vancomycin 1,000 mg (04/23/19 1029)    Assessment/Plan:  1. MRSA bacteremia.  Patient seen by  infectious disease  Dr. Levester Fresh patient on IV vancomycin.  Last positive blood culture 04/12/2019.  TEE negative.  ID discussed with the Dr. Saralyn Pilar regarding  pericarditis, from cardiology standpoint  pericardial centesis would not change length of therapy so this was deferred.  Procedure is risky in his case.  patient has a PICC line.  Social worker and care management team looking into options about disposition.  rehab for continued antibiotic therapy.  Plan is to continue vancomycin now with close monitoring of the renal function and if renal function is getting worse plan is to switch him back to daptomycin.  I will follow-up with infectious disease regarding discharge antibiotic recommendations and course  2. Left shoulder pain and left upper chest pain.  Shoulder pain may be referred pain patient does have some degenerative changes. 3. Pericardial effusion.  No tamponade seen.  My associate spoke with cardiothoracic surgery at [facility] and the patient does not need a pericardial window.  4. Atrial fibrillation.  Now on oral amiodarone 5. COPD.  Continue nebulizer treatments. 6. Chronic pain and neuropathy continue gabapentin and Cymbalta and OxyContin dose increased and oxycodone.  7. Type 2 diabetes mellitus without complication on sliding scale and glargine insulin.  Patient sugar was on the lower side this morning so I cut back glargine insulin to 20 units twice daily.  Hemoglobin A1c elevated at 10.5. 8. Diarrhea  antibiotic induced rule out C. difficile colitis.  Stool for C. difficile toxin is negative.  GI panel negative diarrhea resolved hydrated with IV fluids 9. History of chronic chest pain with pericardial effusion.  EKG with no appreciable change.  Troponin 0 0.03 patient is asymptomatic today seen by cardiology Dr. Ubaldo Glassing no new recommendations no cardiac interventions needed at this time 10. SeenDisposition based on PT assessment-rehab.  Await placement, insurance authorization is  pending anticipate in a  day or 2  Code Status:     Code Status Orders  (From admission, onward)         Start     Ordered   04/08/19 1225  Full code  Continuous     04/08/19 1224        Code Status History    Date Active Date Inactive Code Status Order ID Comments User Context   04/02/2019 1332 04/05/2019 1916 Full Code [id number]  Lang Snow, NP ED      Disposition Plan: Disposition -to skilled nursing facility in 1-2 days. patient's insurance will be started from June 1.  We need insurance authorization Consultants:  Infectious disease  Cardiology  Orthopedic surgery  Procedures:  TEE  Antibiotics:  Vancomycin  Time spent:33 minutes  Illene Silver Heer Justiss  [employer]

## 2019-04-23 NOTE — Evaluation (Signed)
Occupational Therapy Evaluation Patient Details Name: Kenneth Copeland MRN: [id number] DOB: 08-31-1966 Today's Date: 04/23/2019    History of Present Illness Pt is a 53 y.o. male presenting to hospital 04/02/19 with fever and L sided chest pain (with breathing).  Recent DC hospital [place].  Pt admitted with sepsis, pleuritic chest pain, DM, htn, erosive esophagitis.  No definite evidence of PE on testing.  Imaging showing cervical spondylosis and DDD causing moderate to prominent impingement at C5-6 and mild impingement at C2-3 and C4-5. (-) COVID test.  PMH includes CA, DM, htn, hernia repair, liver lobectomy, gastroparesis, pancreatitis, h/o polysubstance disorder, and erosive esophagitis.   Clinical Impression   Pt seen for OT evaluation this date. Prior to hospital admission, pt was independent in all aspects of ADL. Upon arrival to room, pt initially reluctant to participate in OT evaluation, but agreeable after education on role of OT in recovery/DC planning.  Pt provided limited explanation/answers to question during evaluation. Pt endorses living with his girlfriend and girlfriend's daughter.  Pt demonstrated ability to safely and independently transfer within his room without AD. No balance deficits noted, but not formally tested. Pt endorses independently completing all ADLs while in the hospital other than bathing which he feels he could also do independently. Despite limitations in AROM of the LUE and significant pain with movement. Pt is able to open containers, feed himself, and complete self care tasks using BUE. Strength and coordination appear WFL. Pt declined to participate in ADL tasks at time of evaluation.  No skilled OT needs identified. Will sign off. Please re-consult if additional OT needs arise. No OT needs anticipated upon hospital DC.      Follow Up Recommendations  No OT follow up    Equipment Recommendations  None recommended by OT    Recommendations for Other Services        Precautions / Restrictions Precautions Precautions: Fall Restrictions Weight Bearing Restrictions: No      Mobility Bed Mobility Overal bed mobility: Independent             General bed mobility comments: Sit to supine without any noted difficulties.  Transfers Overall transfer level: Independent Equipment used: None             General transfer comment: steady safe transfers noted.     Balance Overall balance assessment: No apparent balance deficits (not formally assessed)                                         ADL either performed or assessed with clinical judgement   ADL                                         General ADL Comments: Pt endorses feeling at baseline level for ADL function. Is ambulating in room independently. Using room commode. Dressing, eating, etc. w/o assistance. LUE pain and ROM do not appear to impact ADL function at this time.      Vision Baseline Vision/History: No visual deficits Patient Visual Report: No change from baseline       Perception     Praxis      Pertinent Vitals/Pain Pain Score: 8  Pain Location: L shoulder/chest.  Pain Descriptors / Indicators: Constant;Sore Pain Intervention(s): Limited activity within patient's tolerance;Monitored during session;Repositioned  Hand Dominance Right   Extremity/Trunk Assessment Upper Extremity Assessment Upper Extremity Assessment: Overall WFL for tasks assessed;LUE deficits/detail LUE Deficits / Details: Pt with limited active shoulder flexion (approx 80 degrees) and endorsing 8/10 pain in LUE. Decreased grip strength and [place]. Pt endorses hx of L forearm sx. Despite challenges pt endorses independence with dressing, bathing, and ADLs in general.    Lower Extremity Assessment Lower Extremity Assessment: Overall WFL for tasks assessed   Cervical / Trunk Assessment Cervical / Trunk Assessment: Normal   Communication  Communication Communication: No difficulties   Cognition Arousal/Alertness: Awake/alert Behavior During Therapy: WFL for tasks assessed/performed Overall Cognitive Status: Within Functional Limits for tasks assessed                                 General Comments: Pt initially upset about needing OT eval. Agreeable after education on role of OT in DC planning process, but remained brief in answers to questions and limited in willingness to participate in ADL tasks.    General Comments       Exercises Other Exercises Other Exercises: Pt educated in falls prevention strategies and role of OT in the hospital setting. Pt verbalized understanding of information provided.    Shoulder Instructions      Home Living Family/patient expects to be discharged to:: Private residence(Pt endorsed living with girlfriend, did not specify type of dwelling. Per chart, past hx of living in hotel room. ) Living Arrangements: Spouse/significant other(Girlfriend. ) Available Help at Discharge: Family;Available PRN/intermittently         Home Layout: One level     Bathroom Shower/Tub: Teacher, early years/pre: Standard         Additional Comments: Pt very brief in answers. Did not go into detail about living situation. Per chart, pt recently moved to the area and was looking for permanent housing.       Prior Functioning/Environment Level of Independence: Independent                 OT Problem List: Decreased strength;Decreased range of motion;Impaired UE functional use;Decreased safety awareness;Decreased coordination;Decreased knowledge of use of DME or AE;Pain      OT Treatment/Interventions:      OT Goals(Current goals can be found in the care plan section) Acute Rehab OT Goals Patient Stated Goal: To have less pain.  OT Goal Formulation: All assessment and education complete, DC therapy Time For Goal Achievement: 04/23/19 Potential to Achieve Goals: Fair   OT Frequency:     Barriers to D/C:            Co-evaluation              AM-PAC OT "6 Clicks" Daily Activity     Outcome Measure Help from another person eating meals?: None Help from another person taking care of personal grooming?: None Help from another person toileting, which includes using toliet, bedpan, or urinal?: None Help from another person bathing (including washing, rinsing, drying)?: None Help from another person to put on and taking off regular upper body clothing?: None Help from another person to put on and taking off regular lower body clothing?: None 6 Click Score: 24   End of Session    Activity Tolerance: Patient tolerated treatment well Patient left: in bed;with call bell/phone within reach  OT Visit Diagnosis: Pain Pain - Right/Left: Left Pain - part of body: Shoulder;Arm  Time: 1017-5102 OT Time Calculation (min): 15 min Charges:  OT General Charges $OT Visit: 1 Visit OT Evaluation $OT Eval Low Complexity: 1 Low OT Treatments $Self Care/Home Management : 8-22 mins  Shara Blazing, M.S., OTR/L Ascom: [phone] 04/23/19, 9:43 AM

## 2019-04-23 NOTE — Progress Notes (Signed)
ID MRSA bacteremia Pericarditis/effusion On vanco Cr increasing gradually- vanco dose being adjusted- because of cocaine use will not DC him home with PICC

## 2019-04-23 NOTE — Care Management Important Message (Signed)
Important Message  Patient Details  Name: Kenneth Copeland MRN: [id number] Date of Birth: 06/09/1966   Medicare Important Message Given:  Yes    Elza Rafter, RN 04/23/2019, 11:18 AM

## 2019-04-24 LAB — BASIC METABOLIC PANEL
Anion gap: 8 (ref 5–15)
BUN: 11 mg/dL (ref 6–20)
CO2: 28 mmol/L (ref 22–32)
Calcium: 8.2 mg/dL — ABNORMAL LOW (ref 8.9–10.3)
Chloride: 97 mmol/L — ABNORMAL LOW (ref 98–111)
Creatinine, Ser: 1.22 mg/dL (ref 0.61–1.24)
GFR calc Af Amer: >60 mL/min (ref >60)
GFR calc non Af Amer: >60 mL/min (ref >60)
Glucose, Bld: 127 mg/dL — ABNORMAL HIGH (ref 70–99)
Potassium: 4 mmol/L (ref 3.5–5.1)
Sodium: 133 mmol/L — ABNORMAL LOW (ref 135–145)

## 2019-04-24 LAB — GLUCOSE, CAPILLARY
Glucose-Capillary: 100 mg/dL — ABNORMAL HIGH (ref 70–99)
Glucose-Capillary: 149 mg/dL — ABNORMAL HIGH (ref 70–99)
Glucose-Capillary: 197 mg/dL — ABNORMAL HIGH (ref 70–99)
Glucose-Capillary: 195 mg/dL — ABNORMAL HIGH (ref 70–99)
Glucose-Capillary: 213 mg/dL — ABNORMAL HIGH (ref 70–99)

## 2019-04-24 LAB — VANCOMYCIN, PEAK: Vancomycin Pk: 34 ug/mL (ref 30–40)

## 2019-04-24 LAB — VANCOMYCIN, TROUGH: Vancomycin Tr: 18 ug/mL (ref 15–20)

## 2019-04-24 MED ORDER — SODIUM CHLORIDE 0.9% FLUSH
10.0000 mL | Freq: Two times a day (BID) | INTRAVENOUS | Status: DC
  Administered 2019-04-24 – 2019-04-25 (×3): 10 mL via INTRAVENOUS

## 2019-04-24 MED ORDER — SODIUM CHLORIDE 0.9 % IV BOLUS
500.0000 mL | Freq: Once | INTRAVENOUS | Status: AC
  Administered 2019-04-24: 500 mL via INTRAVENOUS

## 2019-04-24 MED ORDER — VANCOMYCIN HCL IN DEXTROSE 750-5 MG/150ML-% IV SOLN
750.0000 mg | Freq: Two times a day (BID) | INTRAVENOUS | Status: DC
  Administered 2019-04-24 – 2019-04-25 (×3): 750 mg via INTRAVENOUS
  Filled 2019-04-24 (×6): qty 150

## 2019-04-24 MED ORDER — BOOST / RESOURCE BREEZE PO LIQD CUSTOM: 1.0000 | Freq: Three times a day (TID) | ORAL | Status: DC

## 2019-04-24 MED ORDER — ENSURE MAX PROTEIN PO LIQD
11.0000 [oz_av] | Freq: Two times a day (BID) | ORAL | Status: DC
  Filled 2019-04-24: qty 330

## 2019-04-24 MED ORDER — SODIUM CHLORIDE 0.9 % IV SOLN
INTRAVENOUS | Status: AC
  Administered 2019-04-24 – 2019-04-25 (×3): via INTRAVENOUS

## 2019-04-24 NOTE — Progress Notes (Signed)
[name], male   DOB: 01-28-1966, 53 y.o.   MRN: [id number]   [facility] PROGRESS NOTE  Andrw Mcguirt QZR:[id number] DOB: [birth year] DOA: 04/08/2019 PCP: Patient, No Pcp Per  HPI/Subjective: Patient is doing fine.  Denies any chest pain or shortness of breath no overnight incidents  Objective: Vitals:   04/24/19 0428 04/24/19 0700  BP: 93/69 93/80  Pulse: 98 96  Resp:    Temp: 98.2 F (36.8 C) 98.4 F (36.9 C)  SpO2: 90% 99%    Filed Weights   04/18/19 0337 04/21/19 0515 04/23/19 0445  Weight: 82.8 kg 81.4 kg 82.4 kg    ROS: Review of Systems  Constitutional: Negative for chills and fever.  Eyes: Negative for blurred vision.  Respiratory: Negative for cough and shortness of breath.   Cardiovascular: Negative for chest pain.  Gastrointestinal: Negative for abdominal pain, constipation, diarrhea, nausea and vomiting.  Genitourinary: Negative for dysuria.  Musculoskeletal: Negative for joint pain.  Neurological: Negative for dizziness and headaches.   Exam: Physical Exam  Constitutional: He is oriented to person, place, and time.  HENT:  Nose: No mucosal edema.  Mouth/Throat: No oropharyngeal exudate or posterior oropharyngeal edema.  Eyes: Pupils are equal, round, and reactive to light. Conjunctivae, EOM and lids are normal.  Neck: No JVD present. Carotid bruit is not present. No edema present. No thyroid mass and no thyromegaly present.  Cardiovascular: Regular rhythm, S1 normal and S2 normal. Exam reveals no gallop.  No murmur heard. Pulses:      Dorsalis pedis pulses are 2+ on the right side and 2+ on the left side.  Respiratory: No respiratory distress. He has decreased breath sounds. He has no wheezes. He has no rhonchi. He has no rales.  GI: Soft. Bowel sounds are normal. There is no abdominal tenderness.  Musculoskeletal:     Left shoulder: He exhibits tenderness. He exhibits no swelling.  Lymphadenopathy:    He has no cervical  adenopathy.  Neurological: He is alert and oriented to person, place, and time. No cranial nerve deficit.  Skin: Skin is warm. No rash noted. Nails show no clubbing.  Psychiatric: He has a normal mood and affect.      Data Reviewed: Basic Metabolic Panel: Recent Labs  Lab 04/18/19 0138 04/19/19 0457 04/20/19 3545 04/21/19 0523 04/22/19 0555 04/23/19 0617 04/24/19 0445  NA 131* 134* 133*  --  134*  --  133*  K 3.7 3.8 3.8  --  4.2  --  4.0  CL 94* 96* 95*  --  98  --  97*  CO2 28 28 28   --  27  --  28  GLUCOSE 171* 69* 100*  --  132*  --  127*  BUN 10 13 12   --  10  --  11  CREATININE 0.68 0.85 1.15 1.23 1.12 1.21 1.22  CALCIUM 7.9* 8.3* 8.2*  --  8.2*  --  8.2*  MG 1.8 2.3 2.0  --   --   --   --   Cardiac Enzymes: Recent Labs  Lab 04/22/19 1430  TROPONINI 0.03*   BNP (last 3 results) Recent Labs    04/02/19 0909 04/08/19 0652  BNP 60.0 184.0*      CBG: Recent Labs  Lab 04/23/19 1157 04/23/19 1658 04/23/19 2142 04/24/19 0817 04/24/19 1142  GLUCAP 187* 70 90 100* 149*    Recent Results (from the past 240 hour(s))  CULTURE, BLOOD (ROUTINE X 2) w Reflex to ID Panel  Status: None   Collection Time: 04/16/19 11:59 AM  Result Value Ref Range Status   Specimen Description BLOOD [facility]  Final   Special Requests   Final    BOTTLES DRAWN AEROBIC AND ANAEROBIC Blood Culture adequate volume   Culture   Final    NO GROWTH 5 DAYS Performed at [facility], [address] [place], [place] [address]    Report Status 04/21/2019 FINAL  Final  CULTURE, BLOOD (ROUTINE X 2) w Reflex to ID Panel     Status: None   Collection Time: 04/16/19  1:09 PM  Result Value Ref Range Status   Specimen Description BLOOD LW  Final   Special Requests   Final    BOTTLES DRAWN AEROBIC AND ANAEROBIC Blood Culture adequate volume   Culture   Final    NO GROWTH 5 DAYS Performed at [facility], [address], [place], [place] [address]    Report Status  04/21/2019 FINAL  Final  C difficile quick scan w PCR reflex     Status: None   Collection Time: 04/21/19  3:44 PM  Result Value Ref Range Status   C Diff antigen NEGATIVE NEGATIVE Final   C Diff toxin NEGATIVE NEGATIVE Final   C Diff interpretation No C. difficile detected.  Final    Comment: Performed at [facility], [address] [place], [place] [address]  Gastrointestinal Panel by PCR , Stool     Status: None   Collection Time: 04/21/19  3:44 PM  Result Value Ref Range Status   Campylobacter species NOT DETECTED NOT DETECTED Final   Plesimonas shigelloides NOT DETECTED NOT DETECTED Final   Salmonella species NOT DETECTED NOT DETECTED Final   Yersinia enterocolitica NOT DETECTED NOT DETECTED Final   Vibrio species NOT DETECTED NOT DETECTED Final   Vibrio cholerae NOT DETECTED NOT DETECTED Final   Enteroaggregative E coli (EAEC) NOT DETECTED NOT DETECTED Final   Enteropathogenic E coli (EPEC) NOT DETECTED NOT DETECTED Final   Enterotoxigenic E coli (ETEC) NOT DETECTED NOT DETECTED Final   Shiga like toxin producing E coli (STEC) NOT DETECTED NOT DETECTED Final   Shigella/Enteroinvasive E coli (EIEC) NOT DETECTED NOT DETECTED Final   Cryptosporidium NOT DETECTED NOT DETECTED Final   Cyclospora cayetanensis NOT DETECTED NOT DETECTED Final   Entamoeba histolytica NOT DETECTED NOT DETECTED Final   Giardia lamblia NOT DETECTED NOT DETECTED Final   Adenovirus F40/41 NOT DETECTED NOT DETECTED Final   Astrovirus NOT DETECTED NOT DETECTED Final   Norovirus GI/GII NOT DETECTED NOT DETECTED Final   Rotavirus A NOT DETECTED NOT DETECTED Final   Sapovirus (I, II, IV, and V) NOT DETECTED NOT DETECTED Final    Comment: Performed at [facility], [address], [place], [place] [address]  SARS Coronavirus 2 (CEPHEID - Performed in [place] hospital lab), Hosp Order     Status: None   Collection Time: 04/23/19 12:38 PM  Result Value Ref Range Status   SARS Coronavirus  2 NEGATIVE NEGATIVE Final    Comment: (NOTE) If result is NEGATIVE SARS-CoV-2 target nucleic acids are NOT DETECTED. The SARS-CoV-2 RNA is generally detectable in upper and lower  respiratory specimens during the acute phase of infection. The lowest  concentration of SARS-CoV-2 viral copies this assay can detect is 250  copies / mL. A negative result does not preclude SARS-CoV-2 infection  and should not be used as the sole basis for treatment or other  patient management decisions.  A negative result may occur with  improper specimen collection / handling, submission of specimen other  than nasopharyngeal swab, presence of viral mutation(s) within the  areas targeted by this assay, and inadequate number of viral copies  (<250 copies / mL). A negative result must be combined with clinical  observations, patient history, and epidemiological information. If result is POSITIVE SARS-CoV-2 target nucleic acids are DETECTED. The SARS-CoV-2 RNA is generally detectable in upper and lower  respiratory specimens dur ing the acute phase of infection.  Positive  results are indicative of active infection with SARS-CoV-2.  Clinical  correlation with patient history and other diagnostic information is  necessary to determine patient infection status.  Positive results do  not rule out bacterial infection or co-infection with other viruses. If result is PRESUMPTIVE POSTIVE SARS-CoV-2 nucleic acids MAY BE PRESENT.   A presumptive positive result was obtained on the submitted specimen  and confirmed on repeat testing.  While 2019 novel coronavirus  (SARS-CoV-2) nucleic acids may be present in the submitted sample  additional confirmatory testing may be necessary for epidemiological  and / or clinical management purposes  to differentiate between  SARS-CoV-2 and other Sarbecovirus currently known to infect humans.  If clinically indicated additional testing with an alternate test  methodology  [phone]) is advised. The SARS-CoV-2 RNA is generally  detectable in upper and lower respiratory sp ecimens during the acute  phase of infection. The expected result is Negative. Fact Sheet for Patients:  StrictlyIdeas.no Fact Sheet for Healthcare Providers: BankingDealers.co.za This test is not yet approved or cleared by the [place] FDA and has been authorized for detection and/or diagnosis of SARS-CoV-2 by FDA under an Emergency Use Authorization (EUA).  This EUA will remain in effect (meaning this test can be used) for the duration of the COVID-19 declaration under Section 564(b)(1) of the Act, 21 U.S.C. section 360bbb-3(b)(1), unless the authorization is terminated or revoked sooner. Performed at [facility], [address], [place], [place] [address]      Studies: No results found.  Scheduled Meds: . amiodarone  200 mg Oral BID  . colchicine  0.6 mg Oral BID  . dicyclomine  10 mg Oral QID  . DULoxetine  30 mg Oral Daily  . enoxaparin (LOVENOX) injection  40 mg Subcutaneous Daily  . feeding supplement (ENSURE ENLIVE)  237 mL Oral BID BM  . insulin aspart  0-20 Units Subcutaneous TID WC  . insulin aspart  0-5 Units Subcutaneous QHS  . insulin aspart  5 Units Subcutaneous TID WC  . insulin glargine  20 Units Subcutaneous BID  . meloxicam  7.5 mg Oral Daily  . metoCLOPramide  10 mg Oral TID AC & HS  . multivitamin with minerals  1 tablet Oral Daily  . neomycin-polymyxin b-dexamethasone  1 drop Left Eye Q6H  . oxyCODONE  15 mg Oral Q12H  . pantoprazole  40 mg Oral BID  . polyvinyl alcohol  2 drop Left Eye TID  . sodium chloride flush  10 mL Intravenous Q12H  . sodium chloride flush  10 mL Intravenous Q12H  . sodium chloride flush  10-40 mL Intracatheter Q12H  . sucralfate  1 g Oral QID  . tamsulosin  0.4 mg Oral Daily   Continuous Infusions: . sodium chloride 250 mL (04/24/19 1212)  . sodium chloride    .  sodium chloride 75 mL/hr at 04/24/19 1235  . vancomycin 750 mg (04/24/19 1214)    Assessment/Plan:  1. Hypotension secondary dehydration  hydrate with IV fluids and monitor renal function which is slowly worsening.  Avoid nephrotoxins 2. MRSA bacteremia.  Patient seen by infectious disease  Dr. Levester Fresh patient on IV vancomycin.  Last positive blood culture 04/12/2019.  TEE negative.  ID discussed with the Dr. Saralyn Pilar regarding pericarditis, from cardiology standpoint  pericardial centesis would not change length of therapy so this was deferred.  Procedure is risky in his case.  patient has a PICC line.  Social worker and care management team looking into options about disposition.  rehab for continued antibiotic therapy.  Plan is to continue vancomycin now with close monitoring of the renal function and if renal function is getting worse plan is to switch him back to daptomycin.  I have discussed with infectious disease regarding discharge antibiotic recommendations and course .  Monitoring renal function at this point of time 3. Pericardial effusion.  No tamponade seen.  My associate spoke with cardiothoracic surgery at [facility] and the patient does not need a pericardial window.  4. Atrial fibrillation.  Now on oral amiodarone 5. COPD.  Continue nebulizer treatments. 6. Chronic pain and neuropathy continue gabapentin and Cymbalta and OxyContin dose increased and oxycodone.  7. Type 2 diabetes mellitus without complication on sliding scale and glargine insulin.  Patient sugar was on the lower side this morning so I cut back glargine insulin to 20 units twice daily.  Hemoglobin A1c elevated at 10.5. 8. Diarrhea  antibiotic induced rule out C. difficile colitis.  Stool for C. difficile toxin is negative.  GI panel negative diarrhea resolved hydrated with IV fluids 9. History of chronic chest pain with pericardial effusion.  EKG with no appreciable change.  Troponin 0 0.03 patient is asymptomatic  today seen by cardiology Dr. Ubaldo Glassing no new recommendations no cardiac interventions needed at this time 10. SeenDisposition based on PT assessment-rehab.  Await placement, insurance authorization is pending anticipate in a  day or 2  Code Status:     Code Status Orders  (From admission, onward)         Start     Ordered   04/08/19 1225  Full code  Continuous     04/08/19 1224        Code Status History    Date Active Date Inactive Code Status Order ID Comments User Context   04/02/2019 1332 04/05/2019 1916 Full Code [id number]  Lang Snow, NP ED      Disposition Plan: Disposition -to skilled nursing facility in 1-2 days. patient's insurance will be started from June 1.  We need insurance authorization Consultants:  Infectious disease  Cardiology  Orthopedic surgery  Procedures:  TEE  Antibiotics:  Vancomycin  Time spent:33 minutes  Illene Silver Nylah Butkus  [employer]

## 2019-04-24 NOTE — Progress Notes (Addendum)
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 CT abdomen showed a pancreatic tail lesion- concerning for adenocarcinoma VS necrosis   RD working remotely.  Pt with fair appetite and oral intake; pt eating 50% of meals but refusing Ensure Enlive. Per RN, pt only drinks Ensure clear. Will try Boost Breeze. Per chart review, pt is weight stable since admit.   Addendum: pt refusing all supplements, will discontinue.   Medications reviewed and include: lovenox, insulin, meloxicam, reglan, MVI, protonix, carafate, NaCl @100ml /hr, vancomycin   Labs reviewed: Na 133(L), K 4.0 wnl  Diet Order:   Diet Order            DIET SOFT Room service appropriate? Yes; Fluid consistency: Thin  Diet effective now             EDUCATION NEEDS:   No education needs have been identified at this time  Skin:  Skin Assessment: Reviewed RN Assessment(ecchymosis )  Last BM:  5/30- TYPE 6  Height:   Ht Readings from Last 1 Encounters:  04/12/19 5\' 11"  (1.803 m)    Weight:   Wt Readings from Last 1 Encounters:  04/23/19 82.4 kg    Ideal Body Weight:  78 kg  BMI:  Body mass index is 25.33 kg/m.  Estimated Nutritional Needs:   Kcal:  2200-2500kcal/day   Protein:  110-125g/day    Fluid:  >2.3L/day   Koleen Distance MS, RD, LDN Pager #- [phone] Office#- [phone] After Hours Pager: [phone]

## 2019-04-24 NOTE — Progress Notes (Signed)
Pharmacy Antibiotic Note  Kenneth Copeland is a 53 y.o. male admitted on 04/08/2019 with chest pain and shortness of breath. Pharmacy has been consulted for vancomycin dosing.  Blood cultures with persistent MRSA bacteremia. TEE negative for endocarditis. ID is following the patient.  Previous Dose/assessment: 5/26 23:05 - Vancomycin dose 1250mg  q8 @ steady state  5/27 01:40 - Vancomycin peak 33 mcg/ml 5/27 07:00 - Vancomycin trough 18mcg/ml (Using the Vancomycin 2 level PK calculator, the calculated Ke was 0.1357 and t 1/2 5.1, with an estimated AUC of 618 - Cmin 15.8, Cmax 38.2) 5/27 Dose was then changed to: 1000mg  q 8 hours -  estimated AUC of 494 (Cmin 12.2, Cmax 31.6) 5/29 0620 Vanc dose 1000mg   5/29 0833 Vanc Peak 56  5/29 1437 Vanc trough 31 ug/ml 5/30 @ 0523 Vanc Random level: 17  5/30 dose changed to 1000mg  IV q12h 6/1 vanco 1000mg  IV q12h (dose given at 21:41) 6/2 vanco peak at 00:2 = 34,  vanco trough = 18 at 09:06 for AUC = 637 6/2 dose changed to 750mg  IV q12h for calc AUC = 477   PLAN 6/2 Scr: 1.22, essentially unchanged from 6/1 but up from baseline.   Change Vancomycin 750 mg IV Q 12 hrs started. Goal AUC 400-550. Expected AUC: 477 - anticipate d/c on vancomycin to facility soon.    Height: 5\' 11"  (180.3 cm) Weight: 181 lb 9.6 oz (82.4 kg) IBW/kg (Calculated) : 75.3  Temp (24hrs), Avg:98.4 F (36.9 C), Min:98.2 F (36.8 C), Max:98.5 F (36.9 C)  Recent Labs  Lab 04/20/19 [id number] 04/20/19 3354 04/20/19 1437 04/21/19 0523 04/22/19 0555 04/23/19 0617 04/24/19 0020 04/24/19 0445 04/24/19 0905  CREATININE 1.15  --   --  1.23 1.12 1.21  --  1.22  --   VANCOTROUGH  --   --  31*  --   --   --   --   --  18  VANCOPEAK  --  56*  --   --   --   --  34  --   --   VANCORANDOM  --   --   --  17  --   --   --   --   --     Estimated Creatinine Clearance: 77.2 mL/min (by C-G formula based on SCr of 1.22 mg/dL).    Allergies  Allergen Reactions  . Metformin And  Related     Pt. States it makes his stomach bleed because of a stomach ulcer  . Motrin [Ibuprofen]     Pt. States it "inflames his stomach, causes bleeding in stomach"    Antimicrobials this admission: Vancomycin 5/19 >> Daptomycin 5/22 >>  Dose adjustments this admission: See above  Microbiology results: 5/18 BCx: 2/4 bottles MRSA 5/21 BCx: 2/4 bottles MRSA 5/22 BCx: NG x 5 days 5/25 BCx: NG x 2 days 5/19 UCx: no growth  5/18 MRSA PCR: negative 5/17 SARS-CoV-2: negative  Thank you for allowing pharmacy to be a part of this patient's care.  Doreene Eland, PharmD, BCPS.   Work Cell: [phone] 04/24/2019 10:29 AM

## 2019-04-25 LAB — BASIC METABOLIC PANEL
Anion gap: 8 (ref 5–15)
BUN: 9 mg/dL (ref 6–20)
CO2: 27 mmol/L (ref 22–32)
Calcium: 8 mg/dL — ABNORMAL LOW (ref 8.9–10.3)
Chloride: 99 mmol/L (ref 98–111)
Creatinine, Ser: 0.96 mg/dL (ref 0.61–1.24)
GFR calc Af Amer: >60 mL/min (ref >60)
GFR calc non Af Amer: >60 mL/min (ref >60)
Glucose, Bld: 155 mg/dL — ABNORMAL HIGH (ref 70–99)
Potassium: 4 mmol/L (ref 3.5–5.1)
Sodium: 134 mmol/L — ABNORMAL LOW (ref 135–145)

## 2019-04-25 LAB — GLUCOSE, CAPILLARY
Glucose-Capillary: 106 mg/dL — ABNORMAL HIGH (ref 70–99)
Glucose-Capillary: 144 mg/dL — ABNORMAL HIGH (ref 70–99)
Glucose-Capillary: 95 mg/dL (ref 70–99)

## 2019-04-25 MED ORDER — INSULIN ASPART 100 UNIT/ML ~~LOC~~ SOLN: 5.0000 [IU] | Freq: Three times a day (TID) | SUBCUTANEOUS | 11 refills | Status: AC

## 2019-04-25 MED ORDER — AMIODARONE HCL 200 MG PO TABS
100.0000 mg | ORAL_TABLET | Freq: Two times a day (BID) | ORAL | Status: DC
  Administered 2019-04-25: 100 mg via ORAL
  Filled 2019-04-25: qty 1

## 2019-04-25 MED ORDER — OXYCODONE HCL ER 15 MG PO T12A: 15.0000 mg | EXTENDED_RELEASE_TABLET | Freq: Two times a day (BID) | ORAL | 0 refills | Status: DC

## 2019-04-25 MED ORDER — OXYCODONE HCL 15 MG PO TABS: 15.0000 mg | ORAL_TABLET | ORAL | 0 refills | Status: DC | PRN

## 2019-04-25 MED ORDER — ADULT MULTIVITAMIN W/MINERALS CH: 1.0000 | ORAL_TABLET | Freq: Every day | ORAL | Status: AC

## 2019-04-25 MED ORDER — NEOMYCIN-POLYMYXIN-HC 3.5-10000-1 OP SUSP: 1.0000 [drp] | Freq: Four times a day (QID) | OPHTHALMIC | 0 refills | Status: DC

## 2019-04-25 MED ORDER — AMIODARONE HCL 100 MG PO TABS: 100.0000 mg | ORAL_TABLET | Freq: Two times a day (BID) | ORAL | Status: DC

## 2019-04-25 MED ORDER — DOCUSATE SODIUM 100 MG PO CAPS: 100.0000 mg | ORAL_CAPSULE | Freq: Every day | ORAL | 0 refills | Status: AC | PRN

## 2019-04-25 MED ORDER — VANCOMYCIN IV (FOR PTA / DISCHARGE USE ONLY): 750.0000 mg | Freq: Two times a day (BID) | INTRAVENOUS | 0 refills | Status: DC

## 2019-04-25 MED ORDER — INSULIN GLARGINE 100 UNIT/ML ~~LOC~~ SOLN: 20.0000 [IU] | Freq: Two times a day (BID) | SUBCUTANEOUS | 11 refills | Status: DC

## 2019-04-25 MED ORDER — CALCIUM CARBONATE ANTACID 500 MG PO CHEW: 1.0000 | CHEWABLE_TABLET | Freq: Three times a day (TID) | ORAL | Status: AC | PRN

## 2019-04-25 MED ORDER — ONDANSETRON HCL 4 MG PO TABS: 4.0000 mg | ORAL_TABLET | Freq: Four times a day (QID) | ORAL | 0 refills | Status: DC | PRN

## 2019-04-25 MED ORDER — IPRATROPIUM-ALBUTEROL 0.5-2.5 (3) MG/3ML IN SOLN: 3.0000 mL | Freq: Four times a day (QID) | RESPIRATORY_TRACT | Status: AC | PRN

## 2019-04-25 MED ORDER — AKWA TEARS 83-15 % OP OINT: 1.0000 [drp] | TOPICAL_OINTMENT | Freq: Two times a day (BID) | OPHTHALMIC | Status: DC | PRN

## 2019-04-25 NOTE — Progress Notes (Signed)
EMS here to pick up pt. Pt A&O at this time. Pt discharged.

## 2019-04-25 NOTE — TOC Transition Note (Signed)
Transition of Care [facility]) - CM/SW Discharge Note   Patient Details  Name: Kenneth Copeland MRN: [id number] Date of Birth: [birth year]  [facility] CM/SW Contact:  Ross Ludwig, LCSW Phone Number: 04/25/2019, 3:39 PM   Clinical Narrative:     Patient was approved by insurance company to go to SNF.  Patient initially said he did not want to go to SNF, CSW explained to him that the physicians do not feel comfortable sending him home with a Picc line due to history of cocaine use.  Patient stated he felt offended and has never done iv drugs.  CSW explained to patient, typically if a patient needs IV antibiotics and has history of drug use usually they go to a SNF.  Patient expressed understanding, and stated he hopes he does not have to be at SNF very long.  Patient to be d/c'ed today to [facility] room 39.  Patient and family agreeable to plans will transport via ems RN to call report [phone].   Final next level of care: Skilled Nursing Facility Barriers to Discharge: Barriers Resolved   Patient Goals and CMS Choice Patient states their goals for this hospitalization and ongoing recovery are:: me and my girlfriend are looking for a house CMS Medicare.gov Compare Post Acute Care list provided to:: Patient Choice offered to / list presented to : Patient  Discharge Placement PASRR number recieved: 04/21/19            Patient chooses bed at: [facility] Patient to be transferred to facility by: [facility] Name of family member notified: Patient to notify his significant other. Patient and family notified of of transfer: 04/25/19  Discharge Plan and Services   Discharge Planning Services: CM Consult Post Acute Care Choice: Home Health          DME Arranged: N/A DME Agency: NA       HH Arranged: NA HH Agency: NA Date HH Agency Contacted: 04/12/19 Time [place]arly[place]: 1303 Representative spoke with at [place]:  jason/pam  Social Determinants of Health ([place]) Interventions     Readmission Risk Interventions Readmission Risk Prevention Plan 04/12/2019  Transportation Screening Complete  Home Care Screening Complete  Medication Review (RN CM) Complete

## 2019-04-25 NOTE — Treatment Plan (Signed)
Diagnosis: MRSA bacteremia Baseline Creatinine1 Culture Result: MRSA  Allergies  Allergen Reactions  . Metformin And Related     Pt. States it makes his stomach bleed because of a stomach ulcer  . Motrin [Ibuprofen]     Pt. States it "inflames his stomach, causes bleeding in stomach"    OPAT Orders Discharge antibiotics: Vancomycin 755m IV every 12 hours Per pharmacy protocol  Aim for Vancomycin trough around 15-17 End Date: 05/24/19  [facility]including placement of biopatch  Labs weekly on Monday  while on IV antibiotics: _X_ CBC with differential _X__ CMP _X__ Vancomycin trough Labs weekly on Thursday when on antibiotic X BMP X VAnco trough X ESR  _X_ Please pull PIC at completion of IV antibiotics  Fax weekly lab results promptly  to [phone] Clinic Follow Up Appt:2 weeks  Call [phone] make appt

## 2019-04-25 NOTE — Progress Notes (Signed)
Date of Admission:  04/08/2019      Subjective: Doing better Says pain chest better  Medications:  . amiodarone  100 mg Oral BID  . colchicine  0.6 mg Oral BID  . dicyclomine  10 mg Oral QID  . DULoxetine  30 mg Oral Daily  . enoxaparin (LOVENOX) injection  40 mg Subcutaneous Daily  . insulin aspart  0-20 Units Subcutaneous TID WC  . insulin aspart  0-5 Units Subcutaneous QHS  . insulin aspart  5 Units Subcutaneous TID WC  . insulin glargine  20 Units Subcutaneous BID  . metoCLOPramide  10 mg Oral TID AC & HS  . multivitamin with minerals  1 tablet Oral Daily  . neomycin-polymyxin b-dexamethasone  1 drop Left Eye Q6H  . oxyCODONE  15 mg Oral Q12H  . pantoprazole  40 mg Oral BID  . polyvinyl alcohol  2 drop Left Eye TID  . sodium chloride flush  10 mL Intravenous Q12H  . sodium chloride flush  10 mL Intravenous Q12H  . sodium chloride flush  10-40 mL Intracatheter Q12H  . sucralfate  1 g Oral QID  . tamsulosin  0.4 mg Oral Daily    Objective: Vital signs in last 24 hours: Temp:  [98 F (36.7 C)-98.5 F (36.9 C)] 98.3 F (36.8 C) (06/03 0842) Pulse Rate:  [97-106] 97 (06/03 0842) Resp:  [18-20] 19 (06/03 0842) BP: (90-97)/(67-71) 97/71 (06/03 0842) SpO2:  [96 %-99 %] 99 % (06/03 0842) Weight:  [83.6 kg] 83.6 kg (06/03 0512)  PHYSICAL EXAM:  General: Alert, cooperative, no distress, appears stated age. pale Head: Normocephalic, without obvious abnormality, atraumatic. Eyes: Conjunctivae clear, anicteric sclerae. Pupils are equal ENT Nares normal. No drainage or sinus tenderness. Lips, mucosa, and tongue normal. No Thrush Neck: Supple, symmetrical, no adenopathy, thyroid: non tender no carotid bruit and no JVD. Back: No CVA tenderness. Lungs: Clear to auscultation bilaterally. No Wheezing or Rhonchi. No rales. Heart: Regular rate and rhythm, no murmur, rub or gallop. Abdomen: Soft, non-tender,not distended. Bowel sounds normal. No masses Extremities: atraumatic,  no cyanosis. No edema. No clubbing Skin: No rashes or lesions. Or bruising Lymph: Cervical, supraclavicular normal. Neurologic: Grossly non-focal  Lab Results Recent Labs    04/24/19 0445 04/25/19 0533  NA 133* 134*  K 4.0 4.0  CL 97* 99  CO2 28 27  BUN 11 9  CREATININE 1.22 0.96   Liver Panel No results for input(s): PROT, ALBUMIN, AST, ALT, ALKPHOS, BILITOT, BILIDIR, IBILI in the last 72 hours. Sedimentation Rate No results for input(s): ESRSEDRATE in the last 72 hours. C-Reactive Protein No results for input(s): CRP in the last 72 hours.  Microbiology: 5/18 [facility] MRSA 5/21 C MRSA  5/22 BC NG 5/25 BC NG   Assessment/Plan: 53 yr male with complicated medical history poorly controlled diabetes, pancreatitis, polysubstance abuse, history of intrahepatic cholangiocarcinoma status post partial hepatectomyin 2018and chemotherapywith Folfox, gastroparesis,?  MRSA bacteremia- unclear source-TEE Neg, entire spine imaged and N Repeat blood culturefrom 5/21was positive again- On Vanco ( MIC 1) added daptomycin as vanco level was not therapeuttic- -DC daptomycin on 5/27  vanco trough was 31 and dose was  being adjusted-Now it is 18 and he will be going to [facility] on 750mg  IV q 12 until 7/2  Pericardial effusion ? Due to MRSA Concern for MRSA pericarditis- discussed with Cardiologist Dr.PAraschos, the fluid is minimal and he may be a difficult aspiration especially with previous abdominal surgeries From an antibiotic perspective it will not  alter the duration whether he has MRSA in the pericardial fluid as the procedure is considered risky in his case  Intrahepatic cholangio carcinoma-s/p partial hepatectomy and Chemo CT abdomen showed a pancreatic tail lesion- concerning for adenocarcinoma VS necrosis- EUS and biopsy is recommended as OP  Malignant pericardial effusion?? With h/o hepatic carcinoma and s/p hepatectomy and chemo this should be entertainedbut  remote  Anemia - ? Of chronic disease, h/o GI bleed   DM-poorly controlled- management as per primary team  Discussed the management with the patient and Dr.gouru and Pharmacist

## 2019-04-25 NOTE — Progress Notes (Signed)
PHARMACY CONSULT NOTE FOR:  OUTPATIENT  PARENTERAL ANTIBIOTIC THERAPY (OPAT)  Indication: MRSA bacteremia Regimen:  vancomycin 750mg  IV q12h End date: 05/24/2019  IV antibiotic discharge orders are pended. To discharging provider:  please sign these orders via discharge navigator,  Select New Orders & click on the button choice - Manage This Unsigned Work.     Thank you for allowing pharmacy to be a part of this patient's care.  Doreene Eland, PharmD, BCPS.   Work Cell: [phone] 04/25/2019 1:14 PM

## 2019-04-25 NOTE — Discharge Instructions (Signed)
Follow-up with primary care physician at the facility in 2 to 3 days.  Monitor renal function closely Follow-up with infectious disease Dr. Levester Fresh in 2 weeks.-Call [phone] to make appointment

## 2019-04-25 NOTE — Progress Notes (Signed)
Report called to [facility].  EMS called for transport.

## 2019-04-25 NOTE — Discharge Summary (Signed)
[name] NAME: Kenneth Copeland    MR#:  [id number]  DATE OF BIRTH:  [birth year]  DATE OF ADMISSION:  04/08/2019 ADMITTING PHYSICIAN: Kenneth Leber, MD  DATE OF DISCHARGE:  04/25/19   PRIMARY CARE PHYSICIAN: Kenneth Copeland    ADMISSION DIAGNOSIS:  Pericardial effusion [I31.3] Chest pain, unspecified type [R07.9]  DISCHARGE DIAGNOSIS:  Active Problems:   Chest pain   Acute respiratory failure ([place]) COVID test negative   SECONDARY DIAGNOSIS:   Past Medical History:  Diagnosis Date  . Arthritis   . Cancer ([place])   . Diabetes mellitus without complication ([place])   . Hypertension   . Liver cancer [facility])     HOSPITAL COURSE:  Kenneth Copeland  is a 53 y.o. male with a known history of chronic pain, diabetes, hypertension, history of liver cancer, osteoarthritis, neuropathy who was just recently discharged from the hospital returns back due to worsening chest pain and shortness of breath.  Patient says his pain never really improved since he left the hospital a few days ago.  He describes the pain in the center of his chest nonradiating and associated some shortness of breath and chills.  He also admits to fever but it has not been documented.  He says since the pain was not improving despite taking his medications at home and therefore he came to the ER for further evaluation.  In the ER patient was not noted to be hypoxic or febrile.  He was noted to be tachycardic on his previous hospitalization he was noted to have a small pericardial effusion and given his tachycardia he underwent a repeat echocardiogram in the emergency room which showed worsening of his pericardial effusion but no tamponade physiology.  Given his worsening pericardial effusion the ER physician spoke to the cardiologist who recommended admission under observation for further evaluation.  1. Hypotension secondary dehydration hydrated  with IV fluids .   Decrease amiodarone dose 200 mg p.o. twice daily after discussing with Kenneth Copeland 2. Mild acute kidney injury resolved with IV fluids, avoid nephrotoxins discontinued NSAIDs [place] ID recommendations in view of mildly worsening of renal function .  Renal function back to normal now 3. MRSA bacteremia.  Patient seen by infectious disease  Kenneth Copeland patient on IV vancomycin.  Last positive blood culture 04/12/2019.  TEE negative.  ID discussed with the Kenneth Copeland regarding pericarditis, from cardiology standpoint  pericardial centesis would not change length of therapy so this was deferred.  Procedure is risky in his case.  patient has a PICC line.  Social worker and care management team looking into options about disposition.  rehab for continued antibiotic therapy.  Discharge patient with  vancomycin 750 mg IV every 12 hours until May 24, 2019.  Aim for vancomycin trough around 15-17.  Weekly labs on Monday CBC with differential, CMP, vancomycin trough.  Pull out PICC line at completion of IV antibiotics.  Weekly labs on Thursday when patient is on oral antibiotic BMP Vanco trough and ESR.  Fax weekly labs promptly [phone].  Follow-up with infectious disease in 2 weeks 4.  pericardial effusion.  No tamponade seen.  My associate spoke with cardiothoracic surgery at [facility] and the patient does not need a pericardial window.  4. Atrial fibrillation.  Now on oral amiodarone, dose decreased 200 mg p.o. twice daily in view of hypotension, discussed with Kenneth Copeland 5. COPD.  Continue nebulizer treatments. 6. Chronic pain  and neuropathy continue gabapentin and Cymbalta and OxyContin dose increased and oxycodone.  7. Type 2 diabetes mellitus without complication on sliding scale and glargine insulin.    Lantus 2 times a day.  Hemoglobin A1c elevated at 10.5. 8. Diarrhea  antibiotic induced rule out C. difficile colitis.  Stool for C. difficile toxin is negative.  GI panel negative diarrhea resolved  hydrated with IV fluids 9. History of chronic chest pain with pericardial effusion.  EKG with no appreciable change.  Troponin 0 0.03 patient is asymptomatic today seen by cardiology Kenneth Copeland no new recommendations no cardiac interventions needed at this time 10. Disposition based on PT assessment-rehab.   insurance authorized change to go to [facility]   Discharge patient to [place]:   [place]   CONSULTS OBTAINED:  Treatment Team:  Kenneth Asal, MD Kenneth Park, MD Kenneth Spray, MD   PROCEDURES TEE   DRUG ALLERGIES:   Allergies  Allergen Reactions  . Metformin And Related     Pt. States it makes his stomach bleed because of a stomach ulcer  . Motrin [Ibuprofen]     Pt. States it "inflames his stomach, causes bleeding in stomach"    DISCHARGE MEDICATIONS:   Allergies as of 04/25/2019      Reactions   Metformin And Related    Pt. States it makes his stomach bleed because of a stomach ulcer   Motrin [ibuprofen]    Pt. States it "inflames his stomach, causes bleeding in stomach"      Medication List    STOP taking these medications   insulin lispro 100 UNIT/ML injection Commonly known as:  HUMALOG   oxyCODONE-acetaminophen 7.5-325 MG tablet Commonly known as:  PERCOCET     TAKE these medications   acetaminophen 325 MG tablet Commonly known as:  TYLENOL Take 650 mg by mouth every 6 (six) hours as needed for mild pain or fever.   Akwa Tears 01-[birth year]82 % Oint Place 1 drop into the left eye 2 (two) times daily as needed for up to 30 days. What changed:  reasons to take this   amiodarone 100 MG tablet Commonly known as:  PACERONE Take 1 tablet (100 mg total) by mouth 2 (two) times daily.   calcium carbonate 500 MG chewable tablet Commonly known as:  TUMS - dosed in mg elemental calcium Chew 1 tablet (200 mg of elemental calcium total) by mouth 3 (three) times daily as needed for  indigestion or heartburn.   carboxymethylcellulose 0.5 % Soln Commonly known as:  REFRESH PLUS Place 2 drops into the left eye 3 (three) times daily.   colchicine 0.6 MG tablet Take 1 tablet (0.6 mg total) by mouth 2 (two) times daily.   dicyclomine 10 MG capsule Commonly known as:  BENTYL Take 10 mg by mouth 4 (four) times daily.   docusate sodium 100 MG capsule Commonly known as:  COLACE Take 1 capsule (100 mg total) by mouth daily as needed for mild constipation.   DULoxetine 30 MG capsule Commonly known as:  Cymbalta Take 1 capsule (30 mg total) by mouth daily.   gabapentin 300 MG capsule Commonly known as:  NEURONTIN Take 1 capsule (300 mg total) by mouth 3 (three) times daily.   insulin aspart 100 UNIT/ML injection Commonly known as:  novoLOG Inject 5 Units into the skin 3 (three) times daily with meals.   insulin glargine 100 UNIT/ML injection Commonly known as:  LANTUS Inject  0.2 mLs (20 Units total) into the skin 2 (two) times daily. What changed:  how much to take   ipratropium-albuterol 0.5-2.5 (3) MG/3ML Soln Commonly known as:  DUONEB Take 3 mLs by nebulization every 6 (six) hours as needed.   metoCLOPramide 10 MG tablet Commonly known as:  REGLAN Take 10 mg by mouth 4 (four) times daily -  before meals and at bedtime.   multivitamin with minerals Tabs tablet Take 1 tablet by mouth daily. Start taking on:  April 26, 2019   neomycin-polymyxin-hydrocortisone 3.5-10000-1 ophthalmic suspension Commonly known as:  CORTISPORIN Place 1 drop into the left eye every 6 (six) hours for 30 days.   ondansetron 4 MG tablet Commonly known as:  ZOFRAN Take 1 tablet (4 mg total) by mouth every 6 (six) hours as needed for nausea.   oxyCODONE 15 mg 12 hr tablet Commonly known as:  OXYCONTIN Take 1 tablet (15 mg total) by mouth every 12 (twelve) hours.   oxyCODONE 15 MG immediate release tablet Commonly known as:  ROXICODONE Take 1 tablet (15 mg total) by mouth  every 4 (four) hours as needed for moderate pain or severe pain.   pantoprazole 40 MG tablet Commonly known as:  PROTONIX Take 40 mg by mouth 2 (two) times daily.   sucralfate 1 g tablet Commonly known as:  CARAFATE Take 1 g by mouth 4 (four) times daily.   tamsulosin 0.4 MG Caps capsule Commonly known as:  FLOMAX Take 1 capsule (0.4 mg total) by mouth daily.   vancomycin  IVPB Inject 750 mg into the vein every 12 (twelve) hours for 29 days. Indication:  MRSA bacteremia Last Day of Therapy:  05/24/2019 Labs weekly on Monday  while on IV antibiotic: CBC with differential, CMP, vancomycin trough Labs weekly on Thursday while on IV antibiotic: BMP, vancomycin trough, ESR            Home Infusion Instuctions  (From admission, onward)         Start     Ordered   04/25/19 0000  Home infusion instructions    Question:  Instructions  Answer:  Flushing of vascular access device: 0.9% NaCl pre/post medication administration and prn patency; Heparin 100 u/ml, 67m for implanted ports and Heparin 10u/ml, 567mfor all other central venous catheters.   04/25/19 1349           DISCHARGE INSTRUCTIONS:   Follow-up with primary care physician at the facility in 2 to 3 days.  Monitor renal function closely Follow-up with infectious disease Dr. RaLevester Freshn 2 weeks.-Call [phone] make appointment  DIET:  Diabetic diet  DISCHARGE CONDITION:  Fair  ACTIVITY:  Activity as tolerated  OXYGEN:  Home Oxygen: No.   Oxygen Delivery: room air  DISCHARGE LOCATION:  nursing home   If you experience worsening of your admission symptoms, develop shortness of breath, life threatening emergency, suicidal or homicidal thoughts you must seek medical attention immediately by calling 911 or calling your MD immediately  if symptoms less severe.  You Must read complete instructions/literature along with all the possible adverse reactions/side effects for all the Medicines you take and  that have been prescribed to you. Take any new Medicines after you have completely understood and accpet all the possible adverse reactions/side effects.   Please note  You were cared for by a hospitalist during your hospital stay. If you have any questions about your discharge medications or the care you received while you were in the hospital  after you are discharged, you can call the unit and asked to speak with the hospitalist on call if the hospitalist that took care of you is not available. Once you are discharged, your primary care physician will handle any further medical issues. Please note that NO REFILLS for any discharge medications will be authorized once you are discharged, as it is imperative that you return to your primary care physician (or establish a relationship with a primary care physician if you do not have one) for your aftercare needs so that they can reassess your need for medications and monitor your lab values.     Today  Chief Complaint  Patient presents with  . Shortness of Breath  . Chest Pain   Patient is doing fine with no complaints.  Agreeable to go to [facility] home  ROS:  CONSTITUTIONAL: Denies fevers, chills. Denies any fatigue, weakness.  EYES: Denies blurry vision, double vision, eye pain. EARS, NOSE, THROAT: Denies tinnitus, ear pain, hearing loss. RESPIRATORY: Denies cough, wheeze, shortness of breath.  CARDIOVASCULAR: Denies chest pain, palpitations, edema.  GASTROINTESTINAL: Denies nausea, vomiting, diarrhea, abdominal pain. Denies bright red blood Copeland rectum. GENITOURINARY: Denies dysuria, hematuria. ENDOCRINE: Denies nocturia or thyroid problems. HEMATOLOGIC AND LYMPHATIC: Denies easy bruising or bleeding. SKIN: Denies rash or lesion. MUSCULOSKELETAL: Denies pain in neck, back, shoulder, knees, hips or arthritic symptoms.  NEUROLOGIC: Denies paralysis, paresthesias.  PSYCHIATRIC: Denies anxiety or depressive  symptoms.   VITAL SIGNS:  Blood pressure 97/71, pulse 97, temperature 98.3 F (36.8 C), temperature source Oral, resp. rate 19, height _0  (1.803 m), weight 83.6 kg, SpO2 99 %.  I/O:    Intake/Output Summary (Last 24 hours) at 04/25/2019 1349 Last data filed at 04/25/2019 1222 Gross [place] 24 hour  Intake 150 ml  Output 1375 ml  Net -1225 ml    PHYSICAL EXAMINATION:  GENERAL:  53 y.o.-year-old patient lying in the bed with no acute distress.  EYES: Pupils equal, round, reactive to light and accommodation. No scleral icterus. Extraocular muscles intact.  HEENT: Head atraumatic, normocephalic. Oropharynx and nasopharynx clear.  NECK:  Supple, no jugular venous distention. No thyroid enlargement, no tenderness.  LUNGS: Normal breath sounds bilaterally, no wheezing, rales,rhonchi or crepitation. No use of accessory muscles of respiration.  CARDIOVASCULAR: S1, S2 normal. No murmurs, rubs, or gallops.  ABDOMEN: Soft, non-tender, non-distended. Bowel sounds present.  EXTREMITIES: No pedal edema, cyanosis, or clubbing.  PICC line intact NEUROLOGIC: Cranial nerves II through XII are intact. Muscle strength generalized weakness in all extremities. Sensation intact. Gait not checked.  PSYCHIATRIC: The patient is alert and oriented x 3.  SKIN: No obvious rash, lesion, or ulcer.   DATA REVIEW:   CBC No results for input(s): WBC, HGB, HCT, PLT in the last 168 hours.  Chemistries  Recent Labs  Lab 04/20/19 0613  04/25/19 0533  NA 133*   < > 134*  K 3.8   < > 4.0  CL 95*   < > 99  CO2 28   < > 27  GLUCOSE 100*   < > 155*  BUN 12   < > 9  CREATININE 1.15   < > 0.96  CALCIUM 8.2*   < > 8.0*  MG 2.0  --   --    < > = values in this interval not displayed.    Cardiac Enzymes Recent Labs  Lab 04/22/19 1430  TROPONINI 0.03*    Microbiology Results  Results for orders placed or performed  during the hospital encounter of 04/08/19  SARS Coronavirus 2 (CEPHEID - Performed in [place] hospital lab), Hosp Order     Status: None   Collection Time: 04/08/19  7:40 AM  Result Value Ref Range Status   SARS Coronavirus 2 NEGATIVE NEGATIVE Final    Comment: (NOTE) If result is NEGATIVE SARS-CoV-2 target nucleic acids are NOT DETECTED. The SARS-CoV-2 RNA is generally detectable in upper and lower  respiratory specimens during the acute phase of infection. The lowest  concentration of SARS-CoV-2 viral copies this assay can detect is 250  copies / mL. A negative result does not preclude SARS-CoV-2 infection  and should not be used as the sole basis for treatment or other  patient management decisions.  A negative result may occur with  improper specimen collection / handling, submission of specimen other  than nasopharyngeal swab, presence of viral mutation(s) within the  areas targeted by this assay, and inadequate number of viral copies  (<250 copies / mL). A negative result must be combined with clinical  observations, patient history, and epidemiological information. If result is POSITIVE SARS-CoV-2 target nucleic acids are DETECTED. The SARS-CoV-2 RNA is generally detectable in upper and lower  respiratory specimens dur ing the acute phase of infection.  Positive  results are indicative of active infection with SARS-CoV-2.  Clinical  correlation with patient history and other diagnostic information is  necessary to determine patient infection status.  Positive results do  not rule out bacterial infection or co-infection with other viruses. If result is PRESUMPTIVE POSTIVE SARS-CoV-2 nucleic acids MAY BE PRESENT.   A presumptive positive result was obtained on the submitted specimen  and confirmed on repeat testing.  While 2019 novel coronavirus  (SARS-CoV-2) nucleic acids may be present in the submitted sample  additional confirmatory testing may be necessary for epidemiological  and / or clinical management purposes  to differentiate between  SARS-CoV-2 and  other Sarbecovirus currently known to infect humans.  If clinically indicated additional testing with an alternate test  methodology [phone]) is advised. The SARS-CoV-2 RNA is generally  detectable in upper and lower respiratory sp ecimens during the acute  phase of infection. The expected result is Negative. Fact Sheet for Patients:  StrictlyIdeas.no Fact Sheet for Healthcare Providers: BankingDealers.co.za This test is not yet approved or cleared by the [place] FDA and has been authorized for detection and/or diagnosis of SARS-CoV-2 by FDA under an Emergency Use Authorization (EUA).  This EUA will remain in effect (meaning this test can be used) for the duration of the COVID-19 declaration under Section 564(b)(1) of the Act, 21 U.S.C. section 360bbb-3(b)(1), unless the authorization is terminated or revoked sooner. Performed at [facility], [address] [place], [place] [address]   MRSA PCR Screening     Status: None   Collection Time: 04/09/19  8:48 AM  Result Value Ref Range Status   MRSA by PCR NEGATIVE NEGATIVE Final    Comment:        The GeneXpert MRSA Assay (FDA approved for NASAL specimens only), is one component of a comprehensive MRSA colonization surveillance program. It is not intended to diagnose MRSA infection nor to guide or monitor treatment for MRSA infections. Performed at [facility], [place], [place] [address]   CULTURE, BLOOD (ROUTINE X 2) w Reflex to ID Panel     Status: Abnormal   Collection Time: 04/09/19  8:32 PM  Result Value Ref Range Status   Specimen  Description   Final    BLOOD PORTA CATH Performed at [facility], [address] [place], [place] [address]    Special Requests   Final    BOTTLES DRAWN AEROBIC AND ANAEROBIC Blood Culture results may not be optimal due to an excessive volume of blood received in culture bottles Performed at  [facility], [address], [place], [place] [address]    Culture  Setup Time   Final    GRAM POSITIVE COCCI AEROBIC BOTTLE ONLY CRITICAL RESULT CALLED TO, READ BACK BY AND VERIFIED WITH: Rito Ehrlich AT 2263 04/10/19 SDR Performed at [facility], [place] [address] [place], [place] [address]    Culture METHICILLIN RESISTANT STAPHYLOCOCCUS AUREUS (A)  Final   Report Status 04/12/2019 FINAL  Final   Organism ID, Bacteria METHICILLIN RESISTANT STAPHYLOCOCCUS AUREUS  Final      Susceptibility   Methicillin resistant staphylococcus aureus - MIC*    CIPROFLOXACIN >=8 RESISTANT Resistant     ERYTHROMYCIN >=8 RESISTANT Resistant     GENTAMICIN <=0.5 SENSITIVE Sensitive     OXACILLIN >=4 RESISTANT Resistant     TETRACYCLINE <=1 SENSITIVE Sensitive     VANCOMYCIN 1 SENSITIVE Sensitive     TRIMETH/SULFA 80 RESISTANT Resistant     CLINDAMYCIN >=8 RESISTANT Resistant     RIFAMPIN <=0.5 SENSITIVE Sensitive     Inducible Clindamycin NEGATIVE Sensitive     * METHICILLIN RESISTANT STAPHYLOCOCCUS AUREUS  Blood Culture ID Panel (Reflexed)     Status: Abnormal   Collection Time: 04/09/19  8:32 PM  Result Value Ref Range Status   Enterococcus species NOT DETECTED NOT DETECTED Final   Listeria monocytogenes NOT DETECTED NOT DETECTED Final   Staphylococcus species DETECTED (A) NOT DETECTED Final    Comment: CRITICAL RESULT CALLED TO, READ BACK BY AND VERIFIED WITH: WALID NAZIRI AT 1152 ON 04/10/2019 BY SDR. [facility]    Staphylococcus aureus (BCID) DETECTED (A) NOT DETECTED Final    Comment: Methicillin (oxacillin)-resistant Staphylococcus aureus (MRSA). MRSA is predictably resistant to beta-lactam antibiotics (except ceftaroline). Preferred therapy is vancomycin unless clinically contraindicated. Patient requires contact precautions if  hospitalized. CRITICAL RESULT CALLED TO, READ BACK BY AND VERIFIED WITH: WALID NAZIRI AT 6256 ON 04/10/2019 BY SDR. [place]    Methicillin resistance DETECTED  (A) NOT DETECTED Final    Comment: CRITICAL RESULT CALLED TO, READ BACK BY AND VERIFIED WITH: WALID NAZARI AT 3893 ON 04/10/2019 BY SDR. [place]    Streptococcus species NOT DETECTED NOT DETECTED Final   Streptococcus agalactiae NOT DETECTED NOT DETECTED Final   Streptococcus pneumoniae NOT DETECTED NOT DETECTED Final   Streptococcus pyogenes NOT DETECTED NOT DETECTED Final   Acinetobacter baumannii NOT DETECTED NOT DETECTED Final   Enterobacteriaceae species NOT DETECTED NOT DETECTED Final   Enterobacter cloacae complex NOT DETECTED NOT DETECTED Final   Escherichia coli NOT DETECTED NOT DETECTED Final   Klebsiella oxytoca NOT DETECTED NOT DETECTED Final   Klebsiella pneumoniae NOT DETECTED NOT DETECTED Final   Proteus species NOT DETECTED NOT DETECTED Final   Serratia marcescens NOT DETECTED NOT DETECTED Final   Haemophilus influenzae NOT DETECTED NOT DETECTED Final   Neisseria meningitidis NOT DETECTED NOT DETECTED Final   Pseudomonas aeruginosa NOT DETECTED NOT DETECTED Final   Candida albicans NOT DETECTED NOT DETECTED Final   Candida glabrata NOT DETECTED NOT DETECTED Final   Candida krusei NOT DETECTED NOT DETECTED Final   Candida parapsilosis NOT DETECTED NOT DETECTED Final   Candida tropicalis NOT DETECTED  NOT DETECTED Final    Comment: Performed at [facility], [address] [place], [place] [address]  CULTURE, BLOOD (ROUTINE X 2) w Reflex to ID Panel     Status: Abnormal   Collection Time: 04/09/19  9:02 PM  Result Value Ref Range Status   Specimen Description   Final    BLOOD LEFT HAND Performed at [facility], [address] [place], [place] [address]    Special Requests   Final    BOTTLES DRAWN AEROBIC AND ANAEROBIC Blood Culture results may not be optimal due to an inadequate volume of blood received in culture bottles Performed at [facility], [address] [place], [place] [address]    Culture  Setup Time   Final    GRAM POSITIVE  COCCI IN BOTH AEROBIC AND ANAEROBIC BOTTLES CRITICAL VALUE NOTED.  VALUE IS CONSISTENT WITH PREVIOUSLY REPORTED AND CALLED VALUE. Performed at [facility], [address] [place], [place] [address]    Culture (A)  Final    STAPHYLOCOCCUS AUREUS SUSCEPTIBILITIES PERFORMED ON PREVIOUS CULTURE WITHIN THE LAST 5 DAYS. Performed at [facility], [place] [employer] [address] [place], [place] [address]    Report Status 04/12/2019 FINAL  Final  Urine Culture     Status: None   Collection Time: 04/10/19  3:41 AM  Result Value Ref Range Status   Specimen Description   Final    URINE, RANDOM Performed at [facility], [address] [place], [place] [address]    Special Requests   Final    NONE Performed at [facility], [address], [place], [name] [address]    Culture   Final    NO GROWTH Performed at [facility], [place] [address] [place], [place] [address]    Report Status 04/11/2019 FINAL  Final  Culture, blood (Routine X 2) w Reflex to ID Panel     Status: Abnormal   Collection Time: 04/12/19  1:04 AM  Result Value Ref Range Status   Specimen Description   Final    BLOOD BLOOD LEFT FOREARM Performed at [facility], [address] [place], [place] [address]    Special Requests   Final    BOTTLES DRAWN AEROBIC ONLY Blood Culture results may not be optimal due to an inadequate volume of blood received in culture bottles Performed at [facility], [address] [place], [place] [address]    Culture  Setup Time   Final    GRAM POSITIVE COCCI AEROBIC BOTTLE ONLY CRITICAL RESULT CALLED TO, READ BACK BY AND VERIFIED WITH: JASON ROBBINS ON 04/12/2019 AT 2105 [facility] Performed at [facility], [address] [place], [place] [address]    Culture (A)  Final    STAPHYLOCOCCUS AUREUS SUSCEPTIBILITIES PERFORMED ON PREVIOUS CULTURE WITHIN THE LAST 5 DAYS. Performed at [facility], [place] [address], [place], [place]  [address]    Report Status 04/15/2019 FINAL  Final  Culture, blood (Routine X 2) w Reflex to ID Panel     Status: Abnormal   Collection Time: 04/12/19  1:04 AM  Result Value Ref Range Status   Specimen Description   Final    BLOOD BLOOD LEFT HAND Performed at [facility], [address] [place],  [address]    Special Requests   Final    BOTTLES DRAWN AEROBIC AND ANAEROBIC Blood Culture adequate volume Performed at [facility], [address] [place],  [place] [address]    Culture  Setup Time   Final    Organism ID to follow GRAM POSITIVE COCCI AEROBIC BOTTLE ONLY CRITICAL RESULT CALLED TO, READ BACK BY AND VERIFIED WITH: JASON ROBBINS ON 04/12/2019 AT 2105 [facility] Performed at [facility], [address] [place], [place] [address]    Culture METHICILLIN RESISTANT STAPHYLOCOCCUS AUREUS (A)  Final   Report Status 04/15/2019 FINAL  Final   Organism ID, Bacteria METHICILLIN RESISTANT STAPHYLOCOCCUS AUREUS  Final      Susceptibility   Methicillin resistant staphylococcus aureus - MIC*    CIPROFLOXACIN >=8 RESISTANT Resistant     ERYTHROMYCIN >=8 RESISTANT Resistant     GENTAMICIN <=0.5 SENSITIVE Sensitive     OXACILLIN >=4 RESISTANT Resistant     TETRACYCLINE <=1 SENSITIVE Sensitive     VANCOMYCIN 1 SENSITIVE Sensitive     TRIMETH/SULFA >=320 RESISTANT Resistant     CLINDAMYCIN >=8 RESISTANT Resistant     RIFAMPIN <=0.5 SENSITIVE Sensitive     Inducible Clindamycin NEGATIVE Sensitive     * METHICILLIN RESISTANT STAPHYLOCOCCUS AUREUS  Blood Culture ID Panel (Reflexed)     Status: Abnormal   Collection Time: 04/12/19  1:04 AM  Result Value Ref Range Status   Enterococcus species NOT DETECTED NOT DETECTED Final   Listeria monocytogenes NOT DETECTED NOT DETECTED Final   Staphylococcus species DETECTED (A) NOT DETECTED Final    Comment: CRITICAL RESULT CALLED TO, READ BACK BY AND VERIFIED WITH: CALLED JASON ROBBINS _0  04/12/2019 [facility]     Staphylococcus aureus (BCID) DETECTED (A) NOT DETECTED Final    Comment: Methicillin (oxacillin)-resistant Staphylococcus aureus (MRSA). MRSA is predictably resistant to beta-lactam antibiotics (except ceftaroline). Preferred therapy is vancomycin unless clinically contraindicated. Patient requires contact precautions if  hospitalized. CRITICAL RESULT CALLED TO, READ BACK BY AND VERIFIED WITH: JASON ROBBINS _1  04/12/2019 BY SAC.PMF    Methicillin resistance DETECTED (A) NOT DETECTED Final    Comment: CRITICAL RESULT CALLED TO, READ BACK BY AND VERIFIED WITH: JASON ROBBINS _2  04/12/2019 BY SAC.PMF    Streptococcus species NOT DETECTED NOT DETECTED Final   Streptococcus agalactiae NOT DETECTED NOT DETECTED Final   Streptococcus pneumoniae NOT DETECTED NOT DETECTED Final   Streptococcus pyogenes NOT DETECTED NOT DETECTED Final   Acinetobacter baumannii NOT DETECTED NOT DETECTED Final   Enterobacteriaceae species NOT DETECTED NOT DETECTED Final   Enterobacter cloacae complex NOT DETECTED NOT DETECTED Final   Escherichia coli NOT DETECTED NOT DETECTED Final   Klebsiella oxytoca NOT DETECTED NOT DETECTED Final   Klebsiella pneumoniae NOT DETECTED NOT DETECTED Final   Proteus species NOT DETECTED NOT DETECTED Final   Serratia marcescens NOT DETECTED NOT DETECTED Final   Haemophilus influenzae NOT DETECTED NOT DETECTED Final   Neisseria meningitidis NOT DETECTED NOT DETECTED Final   Pseudomonas aeruginosa NOT DETECTED NOT DETECTED Final   Candida albicans NOT DETECTED NOT DETECTED Final   Candida glabrata NOT DETECTED NOT DETECTED Final   Candida krusei NOT DETECTED NOT DETECTED Final   Candida parapsilosis NOT DETECTED NOT DETECTED Final   Candida tropicalis NOT DETECTED NOT DETECTED Final    Comment: Performed at [facility], [place], [place] [address]  CULTURE, BLOOD (ROUTINE X 2) w Reflex to ID Panel     Status: None   Collection Time: 04/13/19  1:05 PM   Result Value Ref Range Status   Specimen Description BLOOD LEFT HAND  Final   Special Requests   Final    BOTTLES DRAWN  AEROBIC AND ANAEROBIC Blood Culture adequate volume   Culture   Final    NO GROWTH 5 DAYS Performed at [facility], [address] [place], [place] [address]    Report Status 04/18/2019 FINAL  Final  CULTURE, BLOOD (ROUTINE X 2) w Reflex to ID Panel     Status: None   Collection Time: 04/13/19  1:40 PM  Result Value Ref Range Status   Specimen Description BLOOD BLOOD LEFT HAND  Final   Special Requests   Final    BOTTLES DRAWN AEROBIC AND ANAEROBIC Blood Culture adequate volume   Culture   Final    NO GROWTH 5 DAYS Performed at [facility], [address] [place], [place] [address]    Report Status 04/18/2019 FINAL  Final  CULTURE, BLOOD (ROUTINE X 2) w Reflex to ID Panel     Status: None   Collection Time: 04/16/19 11:59 AM  Result Value Ref Range Status   Specimen Description BLOOD [facility]  Final   Special Requests   Final    BOTTLES DRAWN AEROBIC AND ANAEROBIC Blood Culture adequate volume   Culture   Final    NO GROWTH 5 DAYS Performed at [facility], [address] [place], [place] [address]    Report Status 04/21/2019 FINAL  Final  CULTURE, BLOOD (ROUTINE X 2) w Reflex to ID Panel     Status: None   Collection Time: 04/16/19  1:09 PM  Result Value Ref Range Status   Specimen Description BLOOD LW  Final   Special Requests   Final    BOTTLES DRAWN AEROBIC AND ANAEROBIC Blood Culture adequate volume   Culture   Final    NO GROWTH 5 DAYS Performed at [facility], [address] [place], [place] [address]    Report Status 04/21/2019 FINAL  Final  C difficile quick scan w PCR reflex     Status: None   Collection Time: 04/21/19  3:44 PM  Result Value Ref Range Status   C Diff antigen NEGATIVE NEGATIVE Final   C Diff toxin NEGATIVE NEGATIVE Final   C Diff interpretation No C. difficile detected.  Final     Comment: Performed at [facility], [address] [place], [place] [address]  Gastrointestinal Panel by PCR , Stool     Status: None   Collection Time: 04/21/19  3:44 PM  Result Value Ref Range Status   Campylobacter species NOT DETECTED NOT DETECTED Final   Plesimonas shigelloides NOT DETECTED NOT DETECTED Final   Salmonella species NOT DETECTED NOT DETECTED Final   Yersinia enterocolitica NOT DETECTED NOT DETECTED Final   Vibrio species NOT DETECTED NOT DETECTED Final   Vibrio cholerae NOT DETECTED NOT DETECTED Final   Enteroaggregative E coli (EAEC) NOT DETECTED NOT DETECTED Final   Enteropathogenic E coli (EPEC) NOT DETECTED NOT DETECTED Final   Enterotoxigenic E coli (ETEC) NOT DETECTED NOT DETECTED Final   Shiga like toxin producing E coli (STEC) NOT DETECTED NOT DETECTED Final   Shigella/Enteroinvasive E coli (EIEC) NOT DETECTED NOT DETECTED Final   Cryptosporidium NOT DETECTED NOT DETECTED Final   Cyclospora cayetanensis NOT DETECTED NOT DETECTED Final   Entamoeba histolytica NOT DETECTED NOT DETECTED Final   Giardia lamblia NOT DETECTED NOT DETECTED Final   Adenovirus F40/41 NOT DETECTED NOT DETECTED Final   Astrovirus NOT DETECTED NOT DETECTED Final   Norovirus GI/GII NOT DETECTED NOT DETECTED Final   Rotavirus A NOT DETECTED NOT DETECTED Final  Sapovirus (I, II, IV, and V) NOT DETECTED NOT DETECTED Final    Comment: Performed at [facility], [address] [place], [place] [address]  SARS Coronavirus 2 (CEPHEID - Performed in [facility] hospital lab), Hosp Order     Status: None   Collection Time: 04/23/19 12:38 PM  Result Value Ref Range Status   SARS Coronavirus 2 NEGATIVE NEGATIVE Final    Comment: (NOTE) If result is NEGATIVE SARS-CoV-2 target nucleic acids are NOT DETECTED. The SARS-CoV-2 RNA is generally detectable in upper and lower  respiratory specimens during the acute phase of infection. The lowest  concentration of SARS-CoV-2 viral  copies this assay can detect is 250  copies / mL. A negative result does not preclude SARS-CoV-2 infection  and should not be used as the sole basis for treatment or other  patient management decisions.  A negative result may occur with  improper specimen collection / handling, submission of specimen other  than nasopharyngeal swab, presence of viral mutation(s) within the  areas targeted by this assay, and inadequate number of viral copies  (<250 copies / mL). A negative result must be combined with clinical  observations, patient history, and epidemiological information. If result is POSITIVE SARS-CoV-2 target nucleic acids are DETECTED. The SARS-CoV-2 RNA is generally detectable in upper and lower  respiratory specimens dur ing the acute phase of infection.  Positive  results are indicative of active infection with SARS-CoV-2.  Clinical  correlation with patient history and other diagnostic information is  necessary to determine patient infection status.  Positive results do  not rule out bacterial infection or co-infection with other viruses. If result is PRESUMPTIVE POSTIVE SARS-CoV-2 nucleic acids MAY BE PRESENT.   A presumptive positive result was obtained on the submitted specimen  and confirmed on repeat testing.  While 2019 novel coronavirus  (SARS-CoV-2) nucleic acids may be present in the submitted sample  additional confirmatory testing may be necessary for epidemiological  and / or clinical management purposes  to differentiate between  SARS-CoV-2 and other Sarbecovirus currently known to infect humans.  If clinically indicated additional testing with an alternate test  methodology [phone]) is advised. The SARS-CoV-2 RNA is generally  detectable in upper and lower respiratory sp ecimens during the acute  phase of infection. The expected result is Negative. Fact Sheet for Patients:  StrictlyIdeas.no Fact Sheet for Healthcare  Providers: BankingDealers.co.za This test is not yet approved or cleared by the [place] FDA and has been authorized for detection and/or diagnosis of SARS-CoV-2 by FDA under an Emergency Use Authorization (EUA).  This EUA will remain in effect (meaning this test can be used) for the duration of the COVID-19 declaration under Section 564(b)(1) of the Act, 21 U.S.C. section 360bbb-3(b)(1), unless the authorization is terminated or revoked sooner. Performed at [facility], [address] [place],  [address]     RADIOLOGY:  No results found.  EKG:   Orders placed or performed during the hospital encounter of 04/08/19  . EKG 12-Lead  . EKG 12-Lead  . ED EKG  . ED EKG  . EKG 12-Lead  . EKG 12-Lead  . EKG 12-Lead  . EKG 12-Lead  . EKG 12-Lead  . EKG 12-Lead  . EKG - 12 lead  . EKG - 12 lead  . EKG 12-Lead  . EKG 12-Lead  . EKG 12-Lead  . EKG 12-Lead  . EKG 12-Lead  . EKG 12-Lead      Management plans discussed with  the patient, family and they are in agreement.  CODE STATUS:     Code Status Orders  (From admission, onward)         Start     Ordered   04/08/19 1225  Full code  Continuous     04/08/19 1224        Code Status History    Date Active Date Inactive Code Status Order ID Comments User Context   04/02/2019 1332 04/05/2019 1916 Full Code [id number]  Lang Snow, NP ED      TOTAL TIME TAKING CARE OF THIS PATIENT: 45 minutes.   Note: This dictation was prepared with Dragon dictation along with smaller phrase technology. Any transcriptional errors that result from this process are unintentional.   _0 @  on 04/25/2019 at 1:49 PM  Between 7am to 6pm - Pager - [phone]  After 6pm go to [email or web address] - password [email or web address] [facility]  Office  [phone]  CC: Primary care physician; Kenneth Copeland

## 2019-05-07 ENCOUNTER — Telehealth: Payer: Self-pay | Admitting: Infectious Diseases

## 2019-05-07 NOTE — Telephone Encounter (Signed)
Pt with MRSA bacteremia and pericarditis was sent to [facility] to complete IV vancomycin until 05/24/19. Received call from [facility] that patient wanted to leave today and is planning to go AMA-  While recently in [facility] his blood culture on 04/09/19  And 04/12/19 was positive for MRSA and negative on 04/13/19, TEE was negative- He had chest pain pericardial effusion andit was thought thathe had pericarditis likely due to MRSA ( the fluid was not enough for a pericardiocentesis or window) Pt says he is doing very well and has no chest pain. He will go home today on Doxy 100mg  PO BID to complete 2 more weeks. Spoke to the PA of Dr.Hodges at [place] will be removed Spoke to patient as well- asked him to follow up with me in 2 weeks

## 2019-05-12 ENCOUNTER — Inpatient Hospital Stay
Admission: EM | Admit: 2019-05-12 | Discharge: 2019-05-17 | DRG: 073 | Disposition: A | Discharge status: Home / Self Care | Payer: Medicare Other | Attending: Internal Medicine | Admitting: Internal Medicine

## 2019-05-12 ENCOUNTER — Emergency Department: Payer: Medicare Other

## 2019-05-12 ENCOUNTER — Encounter: Payer: Self-pay | Admitting: *Deleted

## 2019-05-12 ENCOUNTER — Other Ambulatory Visit: Payer: Self-pay

## 2019-05-12 DIAGNOSIS — Z886 Allergy status to analgesic agent status: Secondary | ICD-10-CM

## 2019-05-12 DIAGNOSIS — K3184 Gastroparesis: Secondary | ICD-10-CM | POA: Diagnosis present

## 2019-05-12 DIAGNOSIS — R1010 Upper abdominal pain, unspecified: Secondary | ICD-10-CM

## 2019-05-12 DIAGNOSIS — I10 Essential (primary) hypertension: Secondary | ICD-10-CM | POA: Diagnosis present

## 2019-05-12 DIAGNOSIS — Z9221 Personal history of antineoplastic chemotherapy: Secondary | ICD-10-CM | POA: Diagnosis not present

## 2019-05-12 DIAGNOSIS — B9562 Methicillin resistant Staphylococcus aureus infection as the cause of diseases classified elsewhere: Secondary | ICD-10-CM | POA: Diagnosis present

## 2019-05-12 DIAGNOSIS — M1990 Unspecified osteoarthritis, unspecified site: Secondary | ICD-10-CM | POA: Diagnosis present

## 2019-05-12 DIAGNOSIS — Z888 Allergy status to other drugs, medicaments and biological substances status: Secondary | ICD-10-CM | POA: Diagnosis not present

## 2019-05-12 DIAGNOSIS — Z8505 Personal history of malignant neoplasm of liver: Secondary | ICD-10-CM | POA: Diagnosis not present

## 2019-05-12 DIAGNOSIS — Z79891 Long term (current) use of opiate analgesic: Secondary | ICD-10-CM | POA: Diagnosis not present

## 2019-05-12 DIAGNOSIS — R7881 Bacteremia: Secondary | ICD-10-CM | POA: Diagnosis present

## 2019-05-12 DIAGNOSIS — R112 Nausea with vomiting, unspecified: Secondary | ICD-10-CM

## 2019-05-12 DIAGNOSIS — E876 Hypokalemia: Secondary | ICD-10-CM | POA: Diagnosis present

## 2019-05-12 DIAGNOSIS — Z79899 Other long term (current) drug therapy: Secondary | ICD-10-CM

## 2019-05-12 DIAGNOSIS — E860 Dehydration: Secondary | ICD-10-CM | POA: Diagnosis present

## 2019-05-12 DIAGNOSIS — N170 Acute kidney failure with tubular necrosis: Secondary | ICD-10-CM | POA: Diagnosis present

## 2019-05-12 DIAGNOSIS — Z794 Long term (current) use of insulin: Secondary | ICD-10-CM | POA: Diagnosis not present

## 2019-05-12 DIAGNOSIS — Z1159 Encounter for screening for other viral diseases: Secondary | ICD-10-CM | POA: Diagnosis not present

## 2019-05-12 DIAGNOSIS — E1143 Type 2 diabetes mellitus with diabetic autonomic (poly)neuropathy: Secondary | ICD-10-CM | POA: Diagnosis present

## 2019-05-12 DIAGNOSIS — Z87891 Personal history of nicotine dependence: Secondary | ICD-10-CM | POA: Diagnosis not present

## 2019-05-12 DIAGNOSIS — R109 Unspecified abdominal pain: Secondary | ICD-10-CM | POA: Diagnosis present

## 2019-05-12 LAB — URINALYSIS, COMPLETE (UACMP) WITH MICROSCOPIC
Bacteria, UA: NONE SEEN
Bilirubin Urine: NEGATIVE
Glucose, UA: NEGATIVE mg/dL
Hgb urine dipstick: NEGATIVE
Ketones, ur: NEGATIVE mg/dL
Nitrite: NEGATIVE
Protein, ur: NEGATIVE mg/dL
Specific Gravity, Urine: 1.011 (ref 1.005–1.030)
Squamous Epithelial / HPF: NONE SEEN (ref 0–5)
pH: 8 (ref 5.0–8.0)

## 2019-05-12 LAB — COMPREHENSIVE METABOLIC PANEL
ALT: 16 U/L (ref 0–44)
AST: 19 U/L (ref 15–41)
Albumin: 3.8 g/dL (ref 3.5–5.0)
Alkaline Phosphatase: 102 U/L (ref 38–126)
Anion gap: 18 — ABNORMAL HIGH (ref 5–15)
BUN: 13 mg/dL (ref 6–20)
CO2: 19 mmol/L — ABNORMAL LOW (ref 22–32)
Calcium: 9.1 mg/dL (ref 8.9–10.3)
Chloride: 98 mmol/L (ref 98–111)
Creatinine, Ser: 1.26 mg/dL — ABNORMAL HIGH (ref 0.61–1.24)
GFR calc Af Amer: >60 mL/min (ref >60)
GFR calc non Af Amer: >60 mL/min (ref >60)
Glucose, Bld: 116 mg/dL — ABNORMAL HIGH (ref 70–99)
Potassium: 2.9 mmol/L — ABNORMAL LOW (ref 3.5–5.1)
Sodium: 135 mmol/L (ref 135–145)
Total Bilirubin: 0.4 mg/dL (ref 0.3–1.2)
Total Protein: 8.3 g/dL — ABNORMAL HIGH (ref 6.5–8.1)

## 2019-05-12 LAB — CBC
HCT: 28.2 % — ABNORMAL LOW (ref 39.0–52.0)
Hemoglobin: 8.6 g/dL — ABNORMAL LOW (ref 13.0–17.0)
MCH: 25.1 pg — ABNORMAL LOW (ref 26.0–34.0)
MCHC: 30.5 g/dL (ref 30.0–36.0)
MCV: 82.5 fL (ref 80.0–100.0)
Platelets: 449 10*3/uL — ABNORMAL HIGH (ref 150–400)
RBC: 3.42 MIL/uL — ABNORMAL LOW (ref 4.22–5.81)
RDW: 15.3 % (ref 11.5–15.5)
WBC: 8.4 10*3/uL (ref 4.0–10.5)
nRBC: 0 % (ref 0.0–0.2)

## 2019-05-12 LAB — MAGNESIUM: Magnesium: 1.7 mg/dL (ref 1.7–2.4)

## 2019-05-12 LAB — LIPASE, BLOOD: Lipase: 18 U/L (ref 11–51)

## 2019-05-12 LAB — TROPONIN I: Troponin I: <0.03 ng/mL (ref <0.03)

## 2019-05-12 MED ORDER — MORPHINE SULFATE (PF) 4 MG/ML IV SOLN
4.0000 mg | Freq: Once | INTRAVENOUS | Status: AC
  Administered 2019-05-12: 4 mg via INTRAVENOUS
  Filled 2019-05-12: qty 1

## 2019-05-12 MED ORDER — POTASSIUM CHLORIDE 10 MEQ/100ML IV SOLN
10.0000 meq | Freq: Once | INTRAVENOUS | Status: AC
  Administered 2019-05-12: 10 meq via INTRAVENOUS
  Filled 2019-05-12: qty 100

## 2019-05-12 MED ORDER — HYDROMORPHONE HCL 1 MG/ML IJ SOLN
0.5000 mg | INTRAMUSCULAR | Status: AC
  Administered 2019-05-12: 0.5 mg via INTRAVENOUS
  Filled 2019-05-12: qty 1

## 2019-05-12 MED ORDER — MAGNESIUM SULFATE IN D5W 1-5 GM/100ML-% IV SOLN
1.0000 g | Freq: Once | INTRAVENOUS | Status: AC
  Administered 2019-05-12: 1 g via INTRAVENOUS
  Filled 2019-05-12: qty 100

## 2019-05-12 MED ORDER — AMIODARONE HCL 200 MG PO TABS
100.0000 mg | ORAL_TABLET | Freq: Two times a day (BID) | ORAL | Status: DC
  Administered 2019-05-13 – 2019-05-17 (×4): 100 mg via ORAL
  Filled 2019-05-12 (×8): qty 1

## 2019-05-12 MED ORDER — IOHEXOL 300 MG/ML  SOLN
100.0000 mL | Freq: Once | INTRAMUSCULAR | Status: AC | PRN
  Administered 2019-05-12: 100 mL via INTRAVENOUS

## 2019-05-12 MED ORDER — ONDANSETRON HCL 4 MG/2ML IJ SOLN
4.0000 mg | Freq: Once | INTRAMUSCULAR | Status: AC
  Administered 2019-05-12: 4 mg via INTRAVENOUS
  Filled 2019-05-12: qty 2

## 2019-05-12 MED ORDER — SODIUM CHLORIDE 0.9 % IV SOLN
INTRAVENOUS | Status: DC
  Administered 2019-05-13 – 2019-05-17 (×5): via INTRAVENOUS

## 2019-05-12 MED ORDER — IOHEXOL 240 MG/ML SOLN
50.0000 mL | Freq: Once | INTRAMUSCULAR | Status: AC
  Administered 2019-05-12: 50 mL via ORAL

## 2019-05-12 MED ORDER — SODIUM CHLORIDE 0.9 % IV BOLUS
500.0000 mL | Freq: Once | INTRAVENOUS | Status: AC
  Administered 2019-05-12: 500 mL via INTRAVENOUS

## 2019-05-12 MED ORDER — GABAPENTIN 300 MG PO CAPS
300.0000 mg | ORAL_CAPSULE | Freq: Three times a day (TID) | ORAL | Status: DC
  Administered 2019-05-13 – 2019-05-17 (×5): 300 mg via ORAL
  Filled 2019-05-12 (×11): qty 1

## 2019-05-12 MED ORDER — SODIUM CHLORIDE 0.9 % IV BOLUS
1000.0000 mL | Freq: Once | INTRAVENOUS | Status: AC
  Administered 2019-05-12: 1000 mL via INTRAVENOUS

## 2019-05-12 NOTE — ED Notes (Signed)
NS switched to right AC IV site.

## 2019-05-12 NOTE — ED Notes (Signed)
Per [facility] ED tech report, Patient states he had a need to void, but when assisted to sit on side of bed was unable to void. Call light at bedside.

## 2019-05-12 NOTE — ED Notes (Signed)
Patient c/o pain in RAC IV from potassium. Patient repositioned, NS placed higher on pole for faster delivery. Patient bends his arm when he is vomiting or sticking his fingers of his right hand down his throat to facilitate vomiting.

## 2019-05-12 NOTE — ED Notes (Signed)
Patient continues to vomit after sticking his fingers down his throat. Dr. Jacqualine Code aware.

## 2019-05-12 NOTE — H&P (Signed)
[facility] [place] NAME: Kenneth Copeland    MR#:  [id number]  DATE OF BIRTH:  Jun 18, 1966  DATE OF ADMISSION:  05/12/2019  PRIMARY CARE PHYSICIAN: Patient, No Pcp Per   REQUESTING/REFERRING PHYSICIAN: Delman Kitten, MD  CHIEF COMPLAINT:   Chief Complaint  Patient presents with  . Abdominal Pain    HISTORY OF PRESENT ILLNESS:  Kenneth Copeland  is a 53 y.o. male with a known history of diabetes mellitus, hypertension, arthritis, history of pancreatitis, liver cancer.  He presented to the emergency room complaining of epigastric and upper quadrant abdominal pain described as dull cramping as well as intractable nausea and vomiting.  Symptoms have been ongoing for 24 hours.  Pain is described as 10 out of 10 dull cramping and sometimes sharp pain.  Patient denies current diarrhea.  However he reports having had diarrhea on yesterday.  He denies hematemesis, hematochezia, or melena.  He denies fevers or chills.  He denies chest pain or shortness of breath.  Patient reports his glucose usually ranges in the 100s and well-controlled.  Potassium is less than 3.0.  Patient is receiving IV potassium replacement.  No EKG changes appreciated.  Magnesium mildly depleted with magnesium level 1.7.  Patient is also receiving IV magnesium replacement.  CT abdomen shows no acute findings.  C. difficile was negative on 04/21/2019.  Patient was recently admitted with treatment of MRSA bacteremia and continues outpatient IV vancomycin.  This will be resumed during hospitalization.  We have admitted him to the hospitalist service for further management.  PAST MEDICAL HISTORY:   Past Medical History:  Diagnosis Date  . Arthritis   . Cancer ([place])   . Diabetes mellitus without complication ([place])   . Hypertension   . Liver cancer ([place])     PAST SURGICAL HISTORY:   Past Surgical History:  Procedure Laterality Date  . HERNIA REPAIR    . LIVER LOBECTOMY    . TEE  WITHOUT CARDIOVERSION N/A 04/11/2019   Procedure: TRANSESOPHAGEAL ECHOCARDIOGRAM (TEE);  Surgeon: Corey Skains, MD;  Location: [facility] ORS;  Service: Cardiovascular;  Laterality: N/A;    SOCIAL HISTORY:   Social History   Tobacco Use  . Smoking status: Former Research scientist (life sciences)  . Smokeless tobacco: Never Used  Substance Use Topics  . Alcohol use: Not Currently    FAMILY HISTORY:   Family History  Family history unknown: Yes    DRUG ALLERGIES:   Allergies  Allergen Reactions  . Metformin And Related     Pt. States it makes his stomach bleed because of a stomach ulcer  . Motrin [Ibuprofen]     Pt. States it "inflames his stomach, causes bleeding in stomach"    REVIEW OF SYSTEMS:   Review of Systems  Constitutional: Positive for malaise/fatigue. Negative for chills, diaphoresis and fever.  HENT: Negative for congestion, sinus pain and sore throat.   Eyes: Positive for double vision. Negative for blurred vision and pain.  Respiratory: Negative for cough, sputum production and shortness of breath.   Cardiovascular: Negative for chest pain and palpitations.  Gastrointestinal: Positive for abdominal pain, diarrhea, nausea and vomiting. Negative for blood in stool, heartburn and melena.  Genitourinary: Negative for dysuria, flank pain, frequency, hematuria and urgency.  Musculoskeletal: Negative for back pain, falls and neck pain.  Neurological: Negative for dizziness, loss of consciousness, weakness and headaches.  Psychiatric/Behavioral: Negative.  Negative for depression.     MEDICATIONS AT HOME:   Prior to  Admission medications   Medication Sig Start Date End Date Taking? Authorizing Provider  acetaminophen (TYLENOL) 325 MG tablet Take 650 mg by mouth every 6 (six) hours as needed for mild pain or fever.    [provider]  amiodarone (PACERONE) 100 MG tablet Take 1 tablet (100 mg total) by mouth 2 (two) times daily. 04/25/19   Nicholes Mango, MD  Artificial Tear Ointment  (AKWA TEARS) 01-06-82 % OINT Place 1 drop into the left eye 2 (two) times daily as needed for up to 30 days. 04/25/19 05/25/19  Nicholes Mango, MD  calcium carbonate (TUMS - DOSED IN MG ELEMENTAL CALCIUM) 500 MG chewable tablet Chew 1 tablet (200 mg of elemental calcium total) by mouth 3 (three) times daily as needed for indigestion or heartburn. 04/25/19   Gouru, Illene Silver, MD  carboxymethylcellulose (REFRESH PLUS) 0.5 % SOLN Place 2 drops into the left eye 3 (three) times daily.    [provider]  colchicine 0.6 MG tablet Take 1 tablet (0.6 mg total) by mouth 2 (two) times daily. 04/05/19   Gladstone Lighter, MD  dicyclomine (BENTYL) 10 MG capsule Take 10 mg by mouth 4 (four) times daily.     [provider]  docusate sodium (COLACE) 100 MG capsule Take 1 capsule (100 mg total) by mouth daily as needed for mild constipation. 04/25/19   Gouru, Illene Silver, MD  DULoxetine (CYMBALTA) 30 MG capsule Take 1 capsule (30 mg total) by mouth daily. 04/05/19 07/04/19  Gladstone Lighter, MD  gabapentin (NEURONTIN) 300 MG capsule Take 1 capsule (300 mg total) by mouth 3 (three) times daily. 04/05/19   Gladstone Lighter, MD  insulin aspart (NOVOLOG) 100 UNIT/ML injection Inject 5 Units into the skin 3 (three) times daily with meals. 04/25/19   Gouru, Illene Silver, MD  insulin glargine (LANTUS) 100 UNIT/ML injection Inject 0.2 mLs (20 Units total) into the skin 2 (two) times daily. 04/25/19   Gouru, Illene Silver, MD  ipratropium-albuterol (DUONEB) 0.5-2.5 (3) MG/3ML SOLN Take 3 mLs by nebulization every 6 (six) hours as needed. 04/25/19   Nicholes Mango, MD  metoCLOPramide (REGLAN) 10 MG tablet Take 10 mg by mouth 4 (four) times daily -  before meals and at bedtime.     [provider]  Multiple Vitamin (MULTIVITAMIN WITH MINERALS) TABS tablet Take 1 tablet by mouth daily. 04/26/19   Gouru, Illene Silver, MD  neomycin-polymyxin-hydrocortisone (CORTISPORIN) 3.5-10000-1 ophthalmic suspension Place 1 drop into the left eye every 6 (six) hours  for 30 days. 04/25/19 05/25/19  Gouru, Illene Silver, MD  ondansetron (ZOFRAN) 4 MG tablet Take 1 tablet (4 mg total) by mouth every 6 (six) hours as needed for nausea. 04/25/19   Nicholes Mango, MD  oxyCODONE (OXYCONTIN) 15 mg 12 hr tablet Take 1 tablet (15 mg total) by mouth every 12 (twelve) hours. 04/25/19   Gouru, Illene Silver, MD  oxyCODONE (ROXICODONE) 15 MG immediate release tablet Take 1 tablet (15 mg total) by mouth every 4 (four) hours as needed for moderate pain or severe pain. 04/25/19   Gouru, Illene Silver, MD  pantoprazole (PROTONIX) 40 MG tablet Take 40 mg by mouth 2 (two) times daily.     [provider]  sucralfate (CARAFATE) 1 g tablet Take 1 g by mouth 4 (four) times daily.     [provider]  tamsulosin (FLOMAX) 0.4 MG CAPS capsule Take 1 capsule (0.4 mg total) by mouth daily. 04/05/19   Gladstone Lighter, MD  vancomycin IVPB Inject 750 mg into the vein every 12 (twelve) hours for  29 days. Indication:  MRSA bacteremia Last Day of Therapy:  05/24/2019 Labs weekly on Monday  while on IV antibiotic: CBC with differential, CMP, vancomycin trough Labs weekly on Thursday while on IV antibiotic: BMP, vancomycin trough, ESR 04/25/19 05/24/19  Gouru, Illene Silver, MD      VITAL SIGNS:  Blood pressure (!) 155/105, pulse 93, temperature 97.8 F (36.6 C), temperature source Oral, resp. rate 18, SpO2 93 %.  PHYSICAL EXAMINATION:  Physical Exam Vitals signs and nursing note reviewed.  Constitutional:      General: He is not in acute distress.    Appearance: He is well-developed. He is ill-appearing.  HENT:     Head: Normocephalic.     Mouth/Throat:     Mouth: Mucous membranes are moist.  Eyes:     General: No scleral icterus.    Extraocular Movements: Extraocular movements intact.     Pupils: Pupils are equal, round, and reactive to light.  Cardiovascular:     Rate and Rhythm: Normal rate and regular rhythm.     Heart sounds: No murmur. No friction rub. No gallop.   Pulmonary:     Effort: Pulmonary  effort is normal. No respiratory distress.     Breath sounds: Normal breath sounds. No wheezing, rhonchi or rales.  Abdominal:     General: Bowel sounds are normal. There is no distension.     Palpations: Abdomen is soft.     Tenderness: There is abdominal tenderness (Epigastric and left upper quadrant tenderness) in the epigastric area and left upper quadrant. There is no right CVA tenderness, left CVA tenderness or rebound. Negative signs include McBurney's sign.     Hernia: No hernia is present.  Skin:    General: Skin is warm and dry.     Capillary Refill: Capillary refill takes less than 2 seconds.     Coloration: Skin is not jaundiced.     Findings: No erythema or rash.  Neurological:     General: No focal deficit present.     Mental Status: He is alert and oriented to person, place, and time.     Cranial Nerves: No cranial nerve deficit.     Motor: No weakness.  Psychiatric:        Behavior: Behavior normal.      LABORATORY PANEL:   CBC Recent Labs  Lab 05/12/19 1428  WBC 8.4  HGB 8.6*  HCT 28.2*  PLT 449*   ------------------------------------------------------------------------------------------------------------------  Chemistries  Recent Labs  Lab 05/12/19 1428 05/12/19 1513  NA 135  --   K 2.9*  --   CL 98  --   CO2 19*  --   GLUCOSE 116*  --   BUN 13  --   CREATININE 1.26*  --   CALCIUM 9.1  --   MG  --  1.7  AST 19  --   ALT 16  --   ALKPHOS 102  --   BILITOT 0.4  --    ------------------------------------------------------------------------------------------------------------------  Cardiac Enzymes Recent Labs  Lab 05/12/19 1440  TROPONINI <0.03   ------------------------------------------------------------------------------------------------------------------  RADIOLOGY:  Ct Abdomen Pelvis W Contrast  Result Date: 05/12/2019 CLINICAL DATA:  Abdominal pain and vomiting. History of hepatic lesion resection. EXAM: CT ABDOMEN AND PELVIS  WITH CONTRAST TECHNIQUE: Multidetector CT imaging of the abdomen and pelvis was performed using the standard protocol following bolus administration of intravenous contrast. CONTRAST:  134m OMNIPAQUE IOHEXOL 300 MG/ML  SOLN COMPARISON:  CT scan 04/13/2019 FINDINGS: Lower chest: Small right pleural effusion  with overlying atelectasis. The left lung is clear. There is a persistent small pericardial effusion. Hepatobiliary: Postoperative changes involving the liver with previous liver lesion resection. The gallbladder is also surgically absent. I do not see any worrisome hepatic lesions or intrahepatic biliary dilatation. Mild stable common bile duct dilatation. Pancreas: Persistent ill-defined low-attenuation in the pancreatic tail which could be chronic fibrosis. Infiltrating tumor is also possible but felt to be less likely. Recommend correlation with any prior imaging studies that might show this to be a stable process. 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 The portal vein is patent. Spleen: Spleen is upper limits of normal in size. No splenic lesions. Adrenals/Urinary Tract: The adrenal glands and kidneys are unremarkable. No acute obstructive findings or worrisome renal lesions. No collecting system abnormalities. No ureteral or bladder calculi. No bladder mass. Mild bladder distention. Stomach/Bowel: The stomach, duodenum, small bowel and colon are grossly normal without oral contrast. No inflammatory changes, mass lesions or obstructive findings. The terminal ileum and appendix are normal. Vascular/Lymphatic: Stable atherosclerotic calcifications involving the aorta distally. No aneurysm or dissection. The branch vessels are patent. The major venous structures are patent. Small scattered mesenteric and retroperitoneal lymph nodes but no mass or overt adenopathy. Reproductive: The prostate gland and seminal vesicles are unremarkable. Other:  No pelvic mass or free pelvic fluid collections. No inguinal mass or adenopathy. Musculoskeletal: No significant bony findings. IMPRESSION: 1. Persistent area of low attenuation in the pancreatic tail, possibly chronic fibrosis. Infiltrating tumor felt to be less likely. Recommend correlation with any prior imaging studies which might help demonstrate this to be a chronic process. 2. 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 3. Remote postoperative changes involving the liver from a prior hepatic lesion resection. Status post cholecystectomy also. 4. Right pleural effusion with overlying atelectasis. Electronically Signed   By: Marijo Sanes M.D.   On: 05/12/2019 16:37      IMPRESSION AND PLAN:   1.  Gastroparesis - Patient has normal saline infusing through peripheral IV - Patient is n.p.o. with intractable nausea and vomiting - IV antiemetic - IV analgesic for abdominal pain management - No acute findings on abdominal CT  2. hypokalemia - Patient receiving IV potassium replacement therapy -Repeat BMP in the a.m. -Telemetry monitoring -Repeat EKG in the a.m. - Continue serial troponin levels  3.  Acute kidney injury - Likely secondary to dehydration with severe nausea and vomiting - Patient receiving IV fluid replacement -We will repeat BMP in the a.m. and continue to follow renal function closely  4.  Hypomagnesemia - IV magnesium replacement -Repeat magnesium level in the a.m. -Telemetry monitoring  5.  Diabetes mellitus - Moderate sliding scale insulin  6.  MRSA bacteremia-with recent hospitalization -Continue vancomycin as per home therapy dose  DVT and PPI prophylaxis initiated    All the records are reviewed and case discussed with ED provider. The plan of care was discussed in details with the patient (and family). I answered all questions. The patient agreed to proceed with the above mentioned plan. Further  management will depend upon hospital course.   CODE STATUS: Full code  TOTAL TIME TAKING CARE OF THIS PATIENT: 45 minutes.    [place] on 05/12/2019 at 6:34 PM  Pager - [phone]  After 6pm go to [email or web address] - [employer] [facility]  Office  [phone]  CC: Primary care physician;  Patient, No Pcp Per   Note: This dictation was prepared with Dragon dictation along with smaller phrase technology. Any transcriptional errors that result from this process are unintentional.

## 2019-05-12 NOTE — ED Provider Notes (Signed)
[facility] Emergency Department Provider Note  Time seen: 2:36 PM  I have reviewed the triage vital signs and the nursing notes.   HISTORY  Chief Complaint Abdominal Pain   HPI Kenneth Copeland is a 53 y.o. male with a past medical history of diabetes, hypertension, hepatic cancer, presents to the emergency department for vomiting and abdominal pain.  According to the patient since this morning he has been experiencing moderate upper abdominal pain dull cramping pain along with severe nausea and vomiting.  Patient states a history of similar presentations in the past, sometimes due to pancreatitis, sometimes due to gastroparesis.  Patient also is somewhat pale in appearance, mildly diaphoretic denies any chest pain fever cough.  States diarrhea last night that was dark in color.   Past Medical History:  Diagnosis Date  . Arthritis   . Cancer ([place])   . Diabetes mellitus without complication ([place])   . Hypertension   . Liver cancer [facility])     Patient Active Problem List   Diagnosis Date Noted  . Acute respiratory failure ([place])   . Chest pain 04/08/2019  . Pericardial effusion 04/02/2019    Past Surgical History:  Procedure Laterality Date  . HERNIA REPAIR    . LIVER LOBECTOMY    . TEE WITHOUT CARDIOVERSION N/A 04/11/2019   Procedure: TRANSESOPHAGEAL ECHOCARDIOGRAM (TEE);  Surgeon: Corey Skains, MD;  Location: [facility] ORS;  Service: Cardiovascular;  Laterality: N/A;    Prior to Admission medications   Medication Sig Start Date End Date Taking? Authorizing Provider  acetaminophen (TYLENOL) 325 MG tablet Take 650 mg by mouth every 6 (six) hours as needed for mild pain or fever.    [provider]  amiodarone (PACERONE) 100 MG tablet Take 1 tablet (100 mg total) by mouth 2 (two) times daily. 04/25/19   Nicholes Mango, MD  Artificial Tear Ointment (AKWA TEARS) 01-06-82 % OINT Place 1 drop into the left eye 2 (two) times daily as needed for up to 30 days.  04/25/19 05/25/19  Nicholes Mango, MD  calcium carbonate (TUMS - DOSED IN MG ELEMENTAL CALCIUM) 500 MG chewable tablet Chew 1 tablet (200 mg of elemental calcium total) by mouth 3 (three) times daily as needed for indigestion or heartburn. 04/25/19   Gouru, Illene Silver, MD  carboxymethylcellulose (REFRESH PLUS) 0.5 % SOLN Place 2 drops into the left eye 3 (three) times daily.    [provider]  colchicine 0.6 MG tablet Take 1 tablet (0.6 mg total) by mouth 2 (two) times daily. 04/05/19   Gladstone Lighter, MD  dicyclomine (BENTYL) 10 MG capsule Take 10 mg by mouth 4 (four) times daily.     [provider]  docusate sodium (COLACE) 100 MG capsule Take 1 capsule (100 mg total) by mouth daily as needed for mild constipation. 04/25/19   Gouru, Illene Silver, MD  DULoxetine (CYMBALTA) 30 MG capsule Take 1 capsule (30 mg total) by mouth daily. 04/05/19 07/04/19  Gladstone Lighter, MD  gabapentin (NEURONTIN) 300 MG capsule Take 1 capsule (300 mg total) by mouth 3 (three) times daily. 04/05/19   Gladstone Lighter, MD  insulin aspart (NOVOLOG) 100 UNIT/ML injection Inject 5 Units into the skin 3 (three) times daily with meals. 04/25/19   Gouru, Illene Silver, MD  insulin glargine (LANTUS) 100 UNIT/ML injection Inject 0.2 mLs (20 Units total) into the skin 2 (two) times daily. 04/25/19   Gouru, Illene Silver, MD  ipratropium-albuterol (DUONEB) 0.5-2.5 (3) MG/3ML SOLN Take 3 mLs by nebulization every 6 (six)  hours as needed. 04/25/19   Nicholes Mango, MD  metoCLOPramide (REGLAN) 10 MG tablet Take 10 mg by mouth 4 (four) times daily -  before meals and at bedtime.     [provider]  Multiple Vitamin (MULTIVITAMIN WITH MINERALS) TABS tablet Take 1 tablet by mouth daily. 04/26/19   Gouru, Illene Silver, MD  neomycin-polymyxin-hydrocortisone (CORTISPORIN) 3.5-10000-1 ophthalmic suspension Place 1 drop into the left eye every 6 (six) hours for 30 days. 04/25/19 05/25/19  Gouru, Illene Silver, MD  ondansetron (ZOFRAN) 4 MG tablet Take 1 tablet (4 mg total) by  mouth every 6 (six) hours as needed for nausea. 04/25/19   Nicholes Mango, MD  oxyCODONE (OXYCONTIN) 15 mg 12 hr tablet Take 1 tablet (15 mg total) by mouth every 12 (twelve) hours. 04/25/19   Gouru, Illene Silver, MD  oxyCODONE (ROXICODONE) 15 MG immediate release tablet Take 1 tablet (15 mg total) by mouth every 4 (four) hours as needed for moderate pain or severe pain. 04/25/19   Gouru, Illene Silver, MD  pantoprazole (PROTONIX) 40 MG tablet Take 40 mg by mouth 2 (two) times daily.     [provider]  sucralfate (CARAFATE) 1 g tablet Take 1 g by mouth 4 (four) times daily.     [provider]  tamsulosin (FLOMAX) 0.4 MG CAPS capsule Take 1 capsule (0.4 mg total) by mouth daily. 04/05/19   Gladstone Lighter, MD  vancomycin IVPB Inject 750 mg into the vein every 12 (twelve) hours for 29 days. Indication:  MRSA bacteremia Last Day of Therapy:  05/24/2019 Labs weekly on Monday  while on IV antibiotic: CBC with differential, CMP, vancomycin trough Labs weekly on Thursday while on IV antibiotic: BMP, vancomycin trough, ESR 04/25/19 05/24/19  Nicholes Mango, MD    Allergies  Allergen Reactions  . Metformin And Related     Pt. States it makes his stomach bleed because of a stomach ulcer  . Motrin [Ibuprofen]     Pt. States it "inflames his stomach, causes bleeding in stomach"    Family History  Family history unknown: Yes    Social History Social History   Tobacco Use  . Smoking status: Former Research scientist (life sciences)  . Smokeless tobacco: Never Used  Substance Use Topics  . Alcohol use: Not Currently  . Drug use: Yes    Frequency: 0.5 times per week    Types: Marijuana, Cocaine    Review of Systems Constitutional: Negative for fever. Cardiovascular: Negative for chest pain. Respiratory: Negative for shortness of breath. Gastrointestinal: Left upper abdominal pain.  Positive for nausea vomiting.  Positive diarrhea last night. Genitourinary: Negative for urinary compaints Musculoskeletal: Negative for  musculoskeletal complaints Skin: Negative for skin complaints  Neurological: Negative for headache All other ROS negative  ____________________________________________   PHYSICAL EXAM:  VITAL SIGNS: ED Triage Vitals  Enc Vitals Group     BP 05/12/19 1410 (!) 165/113     Pulse Rate 05/12/19 1410 (!) 109     Resp 05/12/19 1410 18     Temp 05/12/19 1410 97.8 F (36.6 C)     Temp Source 05/12/19 1410 Oral     SpO2 05/12/19 1410 100 %     Weight --      Height --      Head Circumference --      Peak Flow --      Pain Score 05/12/19 1414 10     Pain Loc --      Pain Edu? --      Excl. in  GC? --    Constitutional: Alert and oriented.  Somewhat pale and mildly diaphoretic, appears uncomfortable. Eyes: Normal exam ENT      Head: Normocephalic and atraumatic.      Mouth/Throat: Mucous membranes are moist. Cardiovascular: Normal rate, regular rhythm.  Respiratory: Normal respiratory effort without tachypnea nor retractions. Breath sounds are clear  Gastrointestinal: Soft, moderate epigastric tenderness palpation, mild mid and suprapubic tenderness to palpation.  No rebound guarding or distention. Musculoskeletal: Nontender with normal range of motion in all extremities. Neurologic:  Normal speech and language. No gross focal neurologic deficits  Skin:  Skin is warm, dry and intact.  Psychiatric: Mood and affect are normal.   ____________________________________________    RADIOLOGY  CT pending  ____________________________________________   INITIAL IMPRESSION / ASSESSMENT AND PLAN / ED COURSE  Pertinent labs & imaging results that were available during my care of the patient were reviewed by me and considered in my medical decision making (see chart for details).   Patient presents to the emergency department for upper abdominal pain nausea vomiting.  Differential this time would include gastroenteritis, gastroparesis, pancreatitis, colitis or diverticulitis, ACS, anemia,  peptic ulcer disease, GI bleed.  Patient states dark stool last night however rectal exam performed by myself is guaiac negative.  Patient has moderate upper abdominal tenderness, we will check labs including abdominal and cardiac labs, obtain CT imaging the abdomen/pelvis to further evaluate as well as an EKG.  We will IV hydrate, treat pain and nausea and continue to closely monitor.  Patient agreeable to plan of care.  Patient's lab work is started to result.  Patient's anemia is actually improved from baseline.  No significant abnormalities on chemistry besides dehydration with anion gap of 18.  We will continue with IV hydration.  Lipase is normal.  Urine and troponin are pending.  CT scan abdomen/pelvis is pending.  Patient care signed out to oncoming physician.  Keyen Marban was evaluated in Emergency Department on 05/12/2019 for the symptoms described in the history of present illness. He was evaluated in the context of the global COVID-19 pandemic, which necessitated consideration that the patient might be at risk for infection with the SARS-CoV-2 virus that causes COVID-19. Institutional protocols and algorithms that pertain to the evaluation of patients at risk for COVID-19 are in a state of rapid change based on information released by regulatory bodies including the CDC and federal and state organizations. These policies and algorithms were followed during the patient's care in the ED.  ____________________________________________   FINAL CLINICAL IMPRESSION(S) / ED DIAGNOSES  Abdominal pain Nausea vomiting   Harvest Dark, MD 05/12/19 1456

## 2019-05-12 NOTE — ED Notes (Signed)
Patient is resting comfortably at this time. No vomiting or c/o nausea.

## 2019-05-12 NOTE — ED Triage Notes (Signed)
Per EMS report, patient's family called EMS reporting that patient c/o abdominal pain and several episodes of vomiting today. Upon arrival, Patient put his  fingers down his throat and retched until he vomited. Patient states the "vomit was stuck in my throat and this is the only way to get it out."

## 2019-05-12 NOTE — ED Provider Notes (Signed)
Patient's EKG is reviewed at 1501 Heart rate 100 QRS 100 QTc 470 Sinus tachycardia, diffuse T wave abnormality seen in multiple leads, slight inversion and minimal T wave depression.  Troponin wnl  Consider this could be possible to electrolyte abnormality, I have ordered potassium IV additionally a troponin is pending.  Patient reporting no chest pain   Ct Abdomen Pelvis W Contrast  Result Date: 05/12/2019 CLINICAL DATA:  Abdominal pain and vomiting. History of hepatic lesion resection. EXAM: CT ABDOMEN AND PELVIS WITH CONTRAST TECHNIQUE: Multidetector CT imaging of the abdomen and pelvis was performed using the standard protocol following bolus administration of intravenous contrast. CONTRAST:  165mL OMNIPAQUE IOHEXOL 300 MG/ML  SOLN COMPARISON:  CT scan 04/13/2019 FINDINGS: Lower chest: Small right pleural effusion with overlying atelectasis. The left lung is clear. There is a persistent small pericardial effusion. Hepatobiliary: Postoperative changes involving the liver with previous liver lesion resection. The gallbladder is also surgically absent. I do not see any worrisome hepatic lesions or intrahepatic biliary dilatation. Mild stable common bile duct dilatation. Pancreas: Persistent ill-defined low-attenuation in the pancreatic tail which could be chronic fibrosis. Infiltrating tumor is also possible but felt to be less likely. Recommend correlation with any prior imaging studies that might show this to be a stable process. 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 The portal vein is patent. Spleen: Spleen is upper limits of normal in size. No splenic lesions. Adrenals/Urinary Tract: The adrenal glands and kidneys are unremarkable. No acute obstructive findings or worrisome renal lesions. No collecting system abnormalities. No ureteral or bladder calculi. No bladder mass. Mild bladder distention. Stomach/Bowel: The stomach,  duodenum, small bowel and colon are grossly normal without oral contrast. No inflammatory changes, mass lesions or obstructive findings. The terminal ileum and appendix are normal. Vascular/Lymphatic: Stable atherosclerotic calcifications involving the aorta distally. No aneurysm or dissection. The branch vessels are patent. The major venous structures are patent. Small scattered mesenteric and retroperitoneal lymph nodes but no mass or overt adenopathy. Reproductive: The prostate gland and seminal vesicles are unremarkable. Other: No pelvic mass or free pelvic fluid collections. No inguinal mass or adenopathy. Musculoskeletal: No significant bony findings. IMPRESSION: 1. Persistent area of low attenuation in the pancreatic tail, possibly chronic fibrosis. Infiltrating tumor felt to be less likely. Recommend correlation with any prior imaging studies which might help demonstrate this to be a chronic process. 2. 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 3. Remote postoperative changes involving the liver from a prior hepatic lesion resection. Status post cholecystectomy also. 4. Right pleural effusion with overlying atelectasis. Electronically Signed   By: Marijo Sanes M.D.   On: 05/12/2019 16:37     ----------------------------------------- 5:46 PM on 05/12/2019 -----------------------------------------  Despite multiple doses of morphine, antiemetics hydration electrolyte replacement the patient continues to have ongoing abdominal pain nausea inability to tolerate anything by mouth with small amounts of ongoing active emesis.  Discussed with the patient, he reports same in the past similar with "gastroparesis" treated primarily in [place].  Given his intractable nausea vomiting abdominal pain give additional medication he reports Dilaudid has helped him in the past, will admit him to the hospital for ongoing treatment including electrolyte  replacement and further work-up.  Discussed with hospitalist [facility].  Patient in agreement with admission   Delman Kitten, MD 05/12/19 1747

## 2019-05-12 NOTE — ED Notes (Signed)
Patient taken to CT scan. Patient drank one full bottle of contrast and all but a 13mls of 2nd bottle of oral contrast.

## 2019-05-13 ENCOUNTER — Other Ambulatory Visit: Payer: Self-pay

## 2019-05-13 LAB — CBC
HCT: 29.5 % — ABNORMAL LOW (ref 39.0–52.0)
Hemoglobin: 9 g/dL — ABNORMAL LOW (ref 13.0–17.0)
MCH: 25.1 pg — ABNORMAL LOW (ref 26.0–34.0)
MCHC: 30.5 g/dL (ref 30.0–36.0)
MCV: 82.2 fL (ref 80.0–100.0)
Platelets: 389 10*3/uL (ref 150–400)
RBC: 3.59 MIL/uL — ABNORMAL LOW (ref 4.22–5.81)
RDW: 15.6 % — ABNORMAL HIGH (ref 11.5–15.5)
WBC: 7.5 10*3/uL (ref 4.0–10.5)
nRBC: 0 % (ref 0.0–0.2)

## 2019-05-13 LAB — TROPONIN I
Troponin I: <0.03 ng/mL (ref <0.03)
Troponin I: <0.03 ng/mL (ref <0.03)
Troponin I: <0.03 ng/mL (ref <0.03)

## 2019-05-13 LAB — BASIC METABOLIC PANEL
Anion gap: 14 (ref 5–15)
BUN: 13 mg/dL (ref 6–20)
CO2: 20 mmol/L — ABNORMAL LOW (ref 22–32)
Calcium: 8.4 mg/dL — ABNORMAL LOW (ref 8.9–10.3)
Chloride: 103 mmol/L (ref 98–111)
Creatinine, Ser: 0.92 mg/dL (ref 0.61–1.24)
GFR calc Af Amer: >60 mL/min (ref >60)
GFR calc non Af Amer: >60 mL/min (ref >60)
Glucose, Bld: 140 mg/dL — ABNORMAL HIGH (ref 70–99)
Potassium: 3.4 mmol/L — ABNORMAL LOW (ref 3.5–5.1)
Sodium: 137 mmol/L (ref 135–145)

## 2019-05-13 LAB — VANCOMYCIN, RANDOM: Vancomycin Rm: <4

## 2019-05-13 MED ORDER — COLCHICINE 0.6 MG PO TABS
0.6000 mg | ORAL_TABLET | Freq: Two times a day (BID) | ORAL | Status: DC
  Administered 2019-05-16 – 2019-05-17 (×3): 0.6 mg via ORAL
  Filled 2019-05-13 (×9): qty 1

## 2019-05-13 MED ORDER — MAGNESIUM SULFATE 2 GM/50ML IV SOLN
2.0000 g | Freq: Once | INTRAVENOUS | Status: AC
  Administered 2019-05-13: 2 g via INTRAVENOUS
  Filled 2019-05-13: qty 50

## 2019-05-13 MED ORDER — ONDANSETRON HCL 4 MG/2ML IJ SOLN
4.0000 mg | Freq: Four times a day (QID) | INTRAMUSCULAR | Status: DC | PRN
  Administered 2019-05-13 – 2019-05-16 (×5): 4 mg via INTRAVENOUS
  Filled 2019-05-13 (×5): qty 2

## 2019-05-13 MED ORDER — ENOXAPARIN SODIUM 40 MG/0.4ML ~~LOC~~ SOLN
40.0000 mg | SUBCUTANEOUS | Status: DC
  Administered 2019-05-13: 40 mg via SUBCUTANEOUS
  Filled 2019-05-13: qty 0.4

## 2019-05-13 MED ORDER — LABETALOL HCL 5 MG/ML IV SOLN
10.0000 mg | Freq: Once | INTRAVENOUS | Status: AC
  Administered 2019-05-13: 10 mg via INTRAVENOUS
  Filled 2019-05-13: qty 4

## 2019-05-13 MED ORDER — VANCOMYCIN HCL 10 G IV SOLR
1250.0000 mg | Freq: Two times a day (BID) | INTRAVENOUS | Status: DC
  Administered 2019-05-14: 05:00:00 1250 mg via INTRAVENOUS
  Filled 2019-05-13 (×2): qty 1250

## 2019-05-13 MED ORDER — SUCRALFATE 1 G PO TABS
1.0000 g | ORAL_TABLET | Freq: Four times a day (QID) | ORAL | Status: DC
  Administered 2019-05-13 – 2019-05-17 (×7): 1 g via ORAL
  Filled 2019-05-13 (×12): qty 1

## 2019-05-13 MED ORDER — ACETAMINOPHEN 650 MG RE SUPP
650.0000 mg | Freq: Four times a day (QID) | RECTAL | Status: DC | PRN
  Filled 2019-05-13: qty 1

## 2019-05-13 MED ORDER — MORPHINE SULFATE (PF) 2 MG/ML IV SOLN
2.0000 mg | INTRAVENOUS | Status: AC
  Administered 2019-05-13: 20:00:00 2 mg via INTRAVENOUS
  Filled 2019-05-13: qty 1

## 2019-05-13 MED ORDER — ENOXAPARIN SODIUM 40 MG/0.4ML ~~LOC~~ SOLN
40.0000 mg | SUBCUTANEOUS | Status: DC
  Administered 2019-05-15 – 2019-05-17 (×3): 40 mg via SUBCUTANEOUS
  Filled 2019-05-13 (×3): qty 0.4

## 2019-05-13 MED ORDER — HYDROMORPHONE HCL 1 MG/ML IJ SOLN
1.0000 mg | Freq: Once | INTRAMUSCULAR | Status: AC
  Administered 2019-05-13: 1 mg via INTRAVENOUS
  Filled 2019-05-13: qty 1

## 2019-05-13 MED ORDER — ACETAMINOPHEN 325 MG PO TABS: 650.0000 mg | ORAL_TABLET | Freq: Four times a day (QID) | ORAL | Status: DC | PRN

## 2019-05-13 MED ORDER — METOCLOPRAMIDE HCL 10 MG PO TABS
10.0000 mg | ORAL_TABLET | Freq: Three times a day (TID) | ORAL | Status: DC
  Administered 2019-05-13: 10 mg via ORAL
  Filled 2019-05-13: qty 1

## 2019-05-13 MED ORDER — TAMSULOSIN HCL 0.4 MG PO CAPS
0.4000 mg | ORAL_CAPSULE | Freq: Every day | ORAL | Status: DC
  Administered 2019-05-16 – 2019-05-17 (×2): 0.4 mg via ORAL
  Filled 2019-05-13 (×4): qty 1

## 2019-05-13 MED ORDER — DULOXETINE HCL 30 MG PO CPEP
30.0000 mg | ORAL_CAPSULE | Freq: Every day | ORAL | Status: DC
  Administered 2019-05-16 – 2019-05-17 (×2): 30 mg via ORAL
  Filled 2019-05-13 (×3): qty 1

## 2019-05-13 MED ORDER — MORPHINE SULFATE (PF) 2 MG/ML IV SOLN
1.0000 mg | INTRAVENOUS | Status: DC | PRN
  Administered 2019-05-13 (×2): 2 mg via INTRAVENOUS
  Administered 2019-05-13: 1 mg via INTRAVENOUS
  Filled 2019-05-13 (×3): qty 1

## 2019-05-13 MED ORDER — ONDANSETRON HCL 4 MG PO TABS
4.0000 mg | ORAL_TABLET | Freq: Four times a day (QID) | ORAL | Status: DC | PRN
  Filled 2019-05-13: qty 1

## 2019-05-13 MED ORDER — METOCLOPRAMIDE HCL 5 MG/ML IJ SOLN
10.0000 mg | Freq: Four times a day (QID) | INTRAMUSCULAR | Status: DC
  Administered 2019-05-13 – 2019-05-17 (×16): 10 mg via INTRAVENOUS
  Filled 2019-05-13 (×16): qty 2

## 2019-05-13 MED ORDER — DICYCLOMINE HCL 10 MG PO CAPS
10.0000 mg | ORAL_CAPSULE | Freq: Four times a day (QID) | ORAL | Status: DC
  Administered 2019-05-16 – 2019-05-17 (×6): 10 mg via ORAL
  Filled 2019-05-13 (×18): qty 1

## 2019-05-13 MED ORDER — VANCOMYCIN IV (FOR PTA / DISCHARGE USE ONLY): 750.0000 mg | Freq: Two times a day (BID) | INTRAVENOUS | Status: DC

## 2019-05-13 MED ORDER — NEOMYCIN-POLYMYXIN-DEXAMETH 3.5-10000-0.1 OP OINT
TOPICAL_OINTMENT | Freq: Three times a day (TID) | OPHTHALMIC | Status: DC
  Filled 2019-05-13: qty 3.5

## 2019-05-13 MED ORDER — MORPHINE SULFATE (PF) 2 MG/ML IV SOLN
2.0000 mg | INTRAVENOUS | Status: DC | PRN
  Administered 2019-05-13 – 2019-05-17 (×22): 2 mg via INTRAVENOUS
  Filled 2019-05-13 (×22): qty 1

## 2019-05-13 MED ORDER — PANTOPRAZOLE SODIUM 40 MG PO TBEC
40.0000 mg | DELAYED_RELEASE_TABLET | Freq: Two times a day (BID) | ORAL | Status: DC
  Administered 2019-05-13 – 2019-05-17 (×4): 40 mg via ORAL
  Filled 2019-05-13 (×5): qty 1

## 2019-05-13 MED ORDER — VANCOMYCIN HCL 10 G IV SOLR
1750.0000 mg | Freq: Once | INTRAVENOUS | Status: AC
  Administered 2019-05-13: 15:00:00 1750 mg via INTRAVENOUS
  Filled 2019-05-13: qty 1750

## 2019-05-13 NOTE — ED Notes (Signed)
ED TO INPATIENT HANDOFF REPORT  ED Nurse Name and Phone #: Anderson [place] [phone]  S Name/Age/Gender Kenneth Copeland 53 y.o. male Room/Bed: ED35A/ED35A  Code Status   Code Status: Full Code  Home/SNF/Other Home Patient oriented to: self, place, time and situation Is this baseline? Yes   Triage Complete: Triage complete  Chief Complaint Abdominal Pain Emesis  Triage Note Per EMS report, patient's family called EMS reporting that patient c/o abdominal pain and several episodes of vomiting today. Upon arrival, Patient put his  fingers down his throat and retched until he vomited. Patient states the "vomit was stuck in my throat and this is the only way to get it out."    Allergies Allergies  Allergen Reactions  . Metformin And Related     Pt. States it makes his stomach bleed because of a stomach ulcer  . Motrin [Ibuprofen]     Pt. States it "inflames his stomach, causes bleeding in stomach"    Level of Care/Admitting Diagnosis ED Disposition    ED Disposition Condition [facility]: [place] [[id number]]  Level of Care: Med-Surg [16]  Covid Evaluation: Screening Protocol (No Symptoms)  Diagnosis: Gastroparesis due to DM [facility]) [[id number]]  Admitting Physician: Mayer Camel [[id number]]  Attending Physician: Mayer Camel [[id number]]  Estimated length of stay: past midnight tomorrow  Certification:: I certify this patient will need inpatient services for at least 2 midnights  PT Class (Do Not Modify): Inpatient [101]  PT Acc Code (Do Not Modify): Private [1]       B Medical/Surgery History Past Medical History:  Diagnosis Date  . Arthritis   . Cancer ([place])   . Diabetes mellitus without complication ([place])   . Hypertension   . Liver cancer [facility])    Past Surgical History:  Procedure Laterality Date  . HERNIA REPAIR    . LIVER LOBECTOMY    . TEE WITHOUT CARDIOVERSION N/A 04/11/2019   Procedure: TRANSESOPHAGEAL ECHOCARDIOGRAM (TEE);   Surgeon: Corey Skains, MD;  Location: [facility] ORS;  Service: Cardiovascular;  Laterality: N/A;     A IV Location/Drains/Wounds Patient Lines/Drains/Airways Status   Active Line/Drains/Airways    Name:   Placement date:   Placement time:   Site:   Days:   Peripheral IV 05/12/19 Right Hand   05/12/19    1428    Hand   1   Peripheral IV 05/12/19 Right Antecubital   05/12/19    1637    Antecubital   1   PICC Triple Lumen 23/55/73 PICC Right Basilic 45 cm 2 cm   22/02/54    1510     34          Intake/Output Last 24 hours  Intake/Output Summary (Last 24 hours) at 05/13/2019 1335 Last data filed at 05/13/2019 0934 Gross per 24 hour  Intake 1850 ml  Output 1800 ml  Net 50 ml    Labs/Imaging Results for orders placed or performed during the hospital encounter of 05/12/19 (from the past 48 hour(s))  CBC     Status: Abnormal   Collection Time: 05/12/19  2:28 PM  Result Value Ref Range   WBC 8.4 4.0 - 10.5 K/uL   RBC 3.42 (L) 4.22 - 5.81 MIL/uL   Hemoglobin 8.6 (L) 13.0 - 17.0 g/dL   HCT 28.2 (L) 39.0 - 52.0 %   MCV 82.5 80.0 - 100.0 fL   MCH 25.1 (L) 26.0 - 34.0 pg   MCHC 30.5 30.0 -  36.0 g/dL   RDW 15.3 11.5 - 15.5 %   Platelets 449 (H) 150 - 400 K/uL   nRBC 0.0 0.0 - 0.2 %    Comment: Performed at [facility], [address] [place], [place] [address]  Comprehensive metabolic panel     Status: Abnormal   Collection Time: 05/12/19  2:28 PM  Result Value Ref Range   Sodium 135 135 - 145 mmol/L   Potassium 2.9 (L) 3.5 - 5.1 mmol/L   Chloride 98 98 - 111 mmol/L   CO2 19 (L) 22 - 32 mmol/L   Glucose, Bld 116 (H) 70 - 99 mg/dL   BUN 13 6 - 20 mg/dL   Creatinine, Ser 1.26 (H) 0.61 - 1.24 mg/dL   Calcium 9.1 8.9 - 10.3 mg/dL   Total Protein 8.3 (H) 6.5 - 8.1 g/dL   Albumin 3.8 3.5 - 5.0 g/dL   AST 19 15 - 41 U/L   ALT 16 0 - 44 U/L   Alkaline Phosphatase 102 38 - 126 U/L   Total Bilirubin 0.4 0.3 - 1.2 mg/dL   GFR calc non Af Amer >60 >60 mL/min   GFR calc Af  Amer >60 >60 mL/min   Anion gap 18 (H) 5 - 15    Comment: Performed at [facility], [address] [place], [place] [address]  Lipase, blood     Status: None   Collection Time: 05/12/19  2:28 PM  Result Value Ref Range   Lipase 18 11 - 51 U/L    Comment: Performed at [facility], [address], [place], [place] [address]  Troponin I - Add-On to previous collection     Status: None   Collection Time: 05/12/19  2:40 PM  Result Value Ref Range   Troponin I <0.03 <0.03 ng/mL    Comment: Performed at [facility], [address] [place], [place] [address]  Magnesium     Status: None   Collection Time: 05/12/19  3:13 PM  Result Value Ref Range   Magnesium 1.7 1.7 - 2.4 mg/dL    Comment: Performed at [facility], [address] [place], [place] [address]  Urinalysis, Complete w Microscopic     Status: Abnormal   Collection Time: 05/12/19  4:13 PM  Result Value Ref Range   Color, Urine STRAW (A) YELLOW   APPearance CLEAR (A) CLEAR   Specific Gravity, Urine 1.011 1.005 - 1.030   pH 8.0 5.0 - 8.0   Glucose, UA NEGATIVE NEGATIVE mg/dL   Hgb urine dipstick NEGATIVE NEGATIVE   Bilirubin Urine NEGATIVE NEGATIVE   Ketones, ur NEGATIVE NEGATIVE mg/dL   Protein, ur NEGATIVE NEGATIVE mg/dL   Nitrite NEGATIVE NEGATIVE   Leukocytes,Ua TRACE (A) NEGATIVE   RBC / HPF 0-5 0 - 5 RBC/hpf   WBC, UA 6-10 0 - 5 WBC/hpf   Bacteria, UA NONE SEEN NONE SEEN   Squamous Epithelial / LPF NONE SEEN 0 - 5    Comment: Performed at [facility], [address] [place], [place] [address]  Basic metabolic panel     Status: Abnormal   Collection Time: 05/13/19  5:44 AM  Result Value Ref Range   Sodium 137 135 - 145 mmol/L   Potassium 3.4 (L) 3.5 - 5.1 mmol/L   Chloride 103 98 - 111 mmol/L   CO2 20 (L) 22 - 32 mmol/L   Glucose, Bld 140 (H) 70 - 99 mg/dL   BUN 13 6 - 20  mg/dL   Creatinine, Ser 0.92 0.61 - 1.24 mg/dL   Calcium 8.4 (L) 8.9 - 10.3 mg/dL    GFR calc non Af Amer >60 >60 mL/min   GFR calc Af Amer >60 >60 mL/min   Anion gap 14 5 - 15    Comment: Performed at [facility], [address] [place], [place] [address]  CBC     Status: Abnormal   Collection Time: 05/13/19  5:44 AM  Result Value Ref Range   WBC 7.5 4.0 - 10.5 K/uL   RBC 3.59 (L) 4.22 - 5.81 MIL/uL   Hemoglobin 9.0 (L) 13.0 - 17.0 g/dL   HCT 29.5 (L) 39.0 - 52.0 %   MCV 82.2 80.0 - 100.0 fL   MCH 25.1 (L) 26.0 - 34.0 pg   MCHC 30.5 30.0 - 36.0 g/dL   RDW 15.6 (H) 11.5 - 15.5 %   Platelets 389 150 - 400 K/uL   nRBC 0.0 0.0 - 0.2 %    Comment: Performed at [facility], [address] [place], [place] [address]  Troponin I - Now Then Q6H     Status: None   Collection Time: 05/13/19  5:44 AM  Result Value Ref Range   Troponin I <0.03 <0.03 ng/mL    Comment: Performed at [facility], [address] [place], [place] [address]  Troponin I - Now Then Q6H     Status: None   Collection Time: 05/13/19 11:57 AM  Result Value Ref Range   Troponin I <0.03 <0.03 ng/mL    Comment: Performed at [facility], [address] [place],  [address]  Vancomycin, random     Status: None   Collection Time: 05/13/19 11:57 AM  Result Value Ref Range   Vancomycin Rm <4     Comment:        Random Vancomycin therapeutic range is dependent on dosage and time of specimen collection. A peak range is 20.0-40.0 ug/mL A trough range is 5.0-15.0 ug/mL        Performed at [facility], [address] [place],  [address]    Ct Abdomen Pelvis W Contrast  Result Date: 05/12/2019 CLINICAL DATA:  Abdominal pain and vomiting. History of hepatic lesion resection. EXAM: CT ABDOMEN AND PELVIS WITH CONTRAST TECHNIQUE: Multidetector CT imaging of the abdomen and pelvis was performed using the standard protocol following bolus administration of intravenous contrast. CONTRAST:  140mL OMNIPAQUE IOHEXOL 300 MG/ML  SOLN COMPARISON:  CT  scan 04/13/2019 FINDINGS: Lower chest: Small right pleural effusion with overlying atelectasis. The left lung is clear. There is a persistent small pericardial effusion. Hepatobiliary: Postoperative changes involving the liver with previous liver lesion resection. The gallbladder is also surgically absent. I do not see any worrisome hepatic lesions or intrahepatic biliary dilatation. Mild stable common bile duct dilatation. Pancreas: Persistent ill-defined low-attenuation in the pancreatic tail which could be chronic fibrosis. Infiltrating tumor is also possible but felt to be less likely. Recommend correlation with any prior imaging studies that might show this to be a stable process. 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 The portal vein is patent. Spleen: Spleen is upper limits of normal in size. No splenic lesions. Adrenals/Urinary Tract: The adrenal glands and kidneys are unremarkable. No acute obstructive findings or worrisome renal lesions. No collecting system abnormalities. No ureteral or bladder calculi. No bladder mass. Mild bladder distention. Stomach/Bowel: The stomach, duodenum, small bowel and colon  are grossly normal without oral contrast. No inflammatory changes, mass lesions or obstructive findings. The terminal ileum and appendix are normal. Vascular/Lymphatic: Stable atherosclerotic calcifications involving the aorta distally. No aneurysm or dissection. The branch vessels are patent. The major venous structures are patent. Small scattered mesenteric and retroperitoneal lymph nodes but no mass or overt adenopathy. Reproductive: The prostate gland and seminal vesicles are unremarkable. Other: No pelvic mass or free pelvic fluid collections. No inguinal mass or adenopathy. Musculoskeletal: No significant bony findings. IMPRESSION: 1. Persistent area of low attenuation in the pancreatic tail, possibly chronic fibrosis. Infiltrating  tumor felt to be less likely. Recommend correlation with any prior imaging studies which might help demonstrate this to be a chronic process. 2. 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 3. Remote postoperative changes involving the liver from a prior hepatic lesion resection. Status post cholecystectomy also. 4. Right pleural effusion with overlying atelectasis. Electronically Signed   By: Marijo Sanes M.D.   On: 05/12/2019 16:37    Pending Labs Unresulted Labs (From admission, onward)    Start     Ordered   05/19/19 0500  Creatinine, serum  (enoxaparin (LOVENOX)    CrCl >/= 30 ml/min)  Weekly,   STAT    Comments: while on enoxaparin therapy    05/13/19 0419   05/14/19 7867  Basic metabolic panel  Tomorrow morning,   STAT     05/13/19 1237   05/13/19 1230  CULTURE, BLOOD (ROUTINE X 2) w Reflex to ID Panel  BLOOD CULTURE X 2,   STAT     05/13/19 1230   05/13/19 0420  C difficile quick scan w PCR reflex  (C Difficile quick screen w PCR reflex panel)  Once, for 24 hours,   STAT     05/13/19 0419   05/13/19 0419  Troponin I - Now Then Q6H  Now then every 6 hours,   STAT     05/13/19 0419   05/12/19 1748  Novel Coronavirus,NAA,(SEND-OUT TO REF LAB - TAT 24-48 hrs); Hosp Order  (Asymptomatic Patients Labs)  ONCE - STAT,   STAT    Question:  Rule Out  Answer:  Yes   05/12/19 1747          Vitals/Pain Today's Vitals   05/13/19 0830 05/13/19 1315 05/13/19 1316 05/13/19 1324  BP: (!) 152/107 (!) 161/110    Pulse: 95 (!) 101    Resp: 10 16    Temp:      TempSrc:      SpO2: 98% 99%    Weight:    81.6 kg  PainSc:   5      Isolation Precautions Enteric precautions (UV disinfection)  Medications Medications  amiodarone (PACERONE) tablet 100 mg (100 mg Oral Not Given 05/13/19 1142)  colchicine tablet 0.6 mg (0.6 mg Oral Not Given 05/13/19 1142)  DULoxetine (CYMBALTA) DR capsule 30 mg (30 mg Oral Not Given 05/13/19 1142)   gabapentin (NEURONTIN) capsule 300 mg (300 mg Oral Not Given 05/13/19 1142)  neomycin-polymyxin b-dexamethasone (MAXITROL) ophthalmic ointment ( Both Eyes Not Given 05/13/19 1143)  tamsulosin (FLOMAX) capsule 0.4 mg (0.4 mg Oral Not Given 05/13/19 1143)  vancomycin IVPB (0 mg Intravenous Hold 05/13/19 1157)  dicyclomine (BENTYL) capsule 10 mg (10 mg Oral Not Given 05/13/19 1143)  pantoprazole (PROTONIX) EC tablet 40 mg (40 mg Oral Refused 05/13/19 1144)  sucralfate (CARAFATE) tablet 1 g (1 g Oral Refused 05/13/19 1144)  enoxaparin (LOVENOX) injection 40  mg (40 mg Subcutaneous Given 05/13/19 0601)  acetaminophen (TYLENOL) tablet 650 mg (has no administration in time range)    Or  acetaminophen (TYLENOL) suppository 650 mg (has no administration in time range)  ondansetron (ZOFRAN) tablet 4 mg ( Oral See Alternative 05/13/19 1316)    Or  ondansetron (ZOFRAN) injection 4 mg (4 mg Intravenous Given 05/13/19 1316)  0.9 %  sodium chloride infusion ( Intravenous New Bag/Given 05/13/19 0039)  metoCLOPramide (REGLAN) injection 10 mg (10 mg Intravenous Given 05/13/19 1316)  morphine 2 MG/ML injection 2 mg (has no administration in time range)  morphine 4 MG/ML injection 4 mg (4 mg Intravenous Given 05/12/19 1439)  ondansetron (ZOFRAN) injection 4 mg (4 mg Intravenous Given 05/12/19 1437)  sodium chloride 0.9 % bolus 1,000 mL (0 mLs Intravenous Stopped 05/12/19 1909)  iohexol (OMNIPAQUE) 240 MG/ML injection 50 mL (50 mLs Oral Contrast Given 05/12/19 1451)  morphine 4 MG/ML injection 4 mg (4 mg Intravenous Given 05/12/19 1546)  potassium chloride 10 mEq in 100 mL IVPB (0 mEq Intravenous Stopped 05/12/19 1909)  iohexol (OMNIPAQUE) 300 MG/ML solution 100 mL (100 mLs Intravenous Contrast Given 05/12/19 1555)  morphine 4 MG/ML injection 4 mg (4 mg Intravenous Given 05/12/19 1716)  ondansetron (ZOFRAN) injection 4 mg (4 mg Intravenous Given 05/12/19 1716)  HYDROmorphone (DILAUDID) injection 0.5 mg (0.5 mg Intravenous Given  05/12/19 1804)  magnesium sulfate IVPB 1 g 100 mL (0 g Intravenous Stopped 05/12/19 2024)  potassium chloride 10 mEq in 100 mL IVPB (0 mEq Intravenous Stopped 05/12/19 2137)  sodium chloride 0.9 % bolus 500 mL (0 mLs Intravenous Stopped 05/12/19 2230)  HYDROmorphone (DILAUDID) injection 0.5 mg (0.5 mg Intravenous Given 05/12/19 2229)  magnesium sulfate IVPB 2 g 50 mL (0 g Intravenous Stopped 05/13/19 0804)  HYDROmorphone (DILAUDID) injection 1 mg (1 mg Intravenous Given 05/13/19 0432)    Mobility walks with person assist Low fall risk   Focused Assessments    R Recommendations: See Admitting Provider Note  Report given to:   Additional Notes:

## 2019-05-13 NOTE — ED Notes (Signed)
Eyes closed, resp even and unlabored 

## 2019-05-13 NOTE — ED Notes (Addendum)
Entering pt's room, could see him through the slats in door window;  Pt was sitting up in bed with his finger inserted almost all of the way into his mouth; pt immediately withdrew finger when he saw me entering; asked pt why he had his finger in his mouth and he said he was "cleaning out my mouth"; pt reports feeling nauseated now

## 2019-05-13 NOTE — ED Notes (Signed)
ED TO INPATIENT HANDOFF REPORT  ED Nurse Name and Phone #: Anderson [place] [phone]  S Name/Age/Gender Kenneth Copeland 53 y.o. male Room/Bed: ED35A/ED35A  Code Status   Code Status: Full Code  Home/SNF/Other Home Patient oriented to: self, place, time and situation Is this baseline? Yes   Triage Complete: Triage complete  Chief Complaint Abdominal Pain Emesis  Triage Note Per EMS report, patient's family called EMS reporting that patient c/o abdominal pain and several episodes of vomiting today. Upon arrival, Patient put his  fingers down his throat and retched until he vomited. Patient states the "vomit was stuck in my throat and this is the only way to get it out."    Allergies Allergies  Allergen Reactions  . Metformin And Related     Pt. States it makes his stomach bleed because of a stomach ulcer  . Motrin [Ibuprofen]     Pt. States it "inflames his stomach, causes bleeding in stomach"    Level of Care/Admitting Diagnosis ED Disposition    ED Disposition Condition [facility]: [place] [[id number]]  Level of Care: Med-Surg [16]  Covid Evaluation: Screening Protocol (No Symptoms)  Diagnosis: Gastroparesis due to DM [facility]) [[id number]]  Admitting Physician: Mayer Camel [[id number]]  Attending Physician: Mayer Camel [[id number]]  Estimated length of stay: past midnight tomorrow  Certification:: I certify this patient will need inpatient services for at least 2 midnights  PT Class (Do Not Modify): Inpatient [101]  PT Acc Code (Do Not Modify): Private [1]       B Medical/Surgery History Past Medical History:  Diagnosis Date  . Arthritis   . Cancer ([place])   . Diabetes mellitus without complication ([place])   . Hypertension   . Liver cancer [facility])    Past Surgical History:  Procedure Laterality Date  . HERNIA REPAIR    . LIVER LOBECTOMY    . TEE WITHOUT CARDIOVERSION N/A 04/11/2019   Procedure: TRANSESOPHAGEAL ECHOCARDIOGRAM  (TEE);  Surgeon: Corey Skains, MD;  Location: [facility] ORS;  Service: Cardiovascular;  Laterality: N/A;     A IV Location/Drains/Wounds Patient Lines/Drains/Airways Status   Active Line/Drains/Airways    Name:   Placement date:   Placement time:   Site:   Days:   Peripheral IV 05/12/19 Right Hand   05/12/19    1428    Hand   1   Peripheral IV 05/12/19 Right Antecubital   05/12/19    1637    Antecubital   1   PICC Triple Lumen 42/35/36 PICC Right Basilic 45 cm 2 cm   14/43/15    1510     34          Intake/Output Last 24 hours  Intake/Output Summary (Last 24 hours) at 05/13/2019 1145 Last data filed at 05/13/2019 0934 Gross per 24 hour  Intake 1850 ml  Output 1800 ml  Net 50 ml    Labs/Imaging Results for orders placed or performed during the hospital encounter of 05/12/19 (from the past 48 hour(s))  CBC     Status: Abnormal   Collection Time: 05/12/19  2:28 PM  Result Value Ref Range   WBC 8.4 4.0 - 10.5 K/uL   RBC 3.42 (L) 4.22 - 5.81 MIL/uL   Hemoglobin 8.6 (L) 13.0 - 17.0 g/dL   HCT 28.2 (L) 39.0 - 52.0 %   MCV 82.5 80.0 - 100.0 fL   MCH 25.1 (L) 26.0 - 34.0 pg   MCHC 30.5 30.0 -  36.0 g/dL   RDW 15.3 11.5 - 15.5 %   Platelets 449 (H) 150 - 400 K/uL   nRBC 0.0 0.0 - 0.2 %    Comment: Performed at [facility], [address] [place], [place] [address]  Comprehensive metabolic panel     Status: Abnormal   Collection Time: 05/12/19  2:28 PM  Result Value Ref Range   Sodium 135 135 - 145 mmol/L   Potassium 2.9 (L) 3.5 - 5.1 mmol/L   Chloride 98 98 - 111 mmol/L   CO2 19 (L) 22 - 32 mmol/L   Glucose, Bld 116 (H) 70 - 99 mg/dL   BUN 13 6 - 20 mg/dL   Creatinine, Ser 1.26 (H) 0.61 - 1.24 mg/dL   Calcium 9.1 8.9 - 10.3 mg/dL   Total Protein 8.3 (H) 6.5 - 8.1 g/dL   Albumin 3.8 3.5 - 5.0 g/dL   AST 19 15 - 41 U/L   ALT 16 0 - 44 U/L   Alkaline Phosphatase 102 38 - 126 U/L   Total Bilirubin 0.4 0.3 - 1.2 mg/dL   GFR calc non Af Amer >60 >60 mL/min   GFR  calc Af Amer >60 >60 mL/min   Anion gap 18 (H) 5 - 15    Comment: Performed at [facility], [address] [place], [place] [address]  Lipase, blood     Status: None   Collection Time: 05/12/19  2:28 PM  Result Value Ref Range   Lipase 18 11 - 51 U/L    Comment: Performed at [facility], [address], [place], [place] [address]  Troponin I - Add-On to previous collection     Status: None   Collection Time: 05/12/19  2:40 PM  Result Value Ref Range   Troponin I <0.03 <0.03 ng/mL    Comment: Performed at [facility], [address] [place], [place] [address]  Magnesium     Status: None   Collection Time: 05/12/19  3:13 PM  Result Value Ref Range   Magnesium 1.7 1.7 - 2.4 mg/dL    Comment: Performed at [facility], [address] [place], [place] [address]  Urinalysis, Complete w Microscopic     Status: Abnormal   Collection Time: 05/12/19  4:13 PM  Result Value Ref Range   Color, Urine STRAW (A) YELLOW   APPearance CLEAR (A) CLEAR   Specific Gravity, Urine 1.011 1.005 - 1.030   pH 8.0 5.0 - 8.0   Glucose, UA NEGATIVE NEGATIVE mg/dL   Hgb urine dipstick NEGATIVE NEGATIVE   Bilirubin Urine NEGATIVE NEGATIVE   Ketones, ur NEGATIVE NEGATIVE mg/dL   Protein, ur NEGATIVE NEGATIVE mg/dL   Nitrite NEGATIVE NEGATIVE   Leukocytes,Ua TRACE (A) NEGATIVE   RBC / HPF 0-5 0 - 5 RBC/hpf   WBC, UA 6-10 0 - 5 WBC/hpf   Bacteria, UA NONE SEEN NONE SEEN   Squamous Epithelial / LPF NONE SEEN 0 - 5    Comment: Performed at [facility], [address] [place], [place] [address]  Basic metabolic panel     Status: Abnormal   Collection Time: 05/13/19  5:44 AM  Result Value Ref Range   Sodium 137 135 - 145 mmol/L   Potassium 3.4 (L) 3.5 - 5.1 mmol/L   Chloride 103 98 - 111 mmol/L   CO2 20 (L) 22 - 32 mmol/L   Glucose, Bld 140 (H) 70 - 99 mg/dL   BUN 13 6 - 20  mg/dL   Creatinine, Ser 0.92 0.61 - 1.24 mg/dL   Calcium 8.4 (L) 8.9 - 10.3  mg/dL   GFR calc non Af Amer >60 >60 mL/min   GFR calc Af Amer >60 >60 mL/min   Anion gap 14 5 - 15    Comment: Performed at [facility], [address] [place], [place] [address]  CBC     Status: Abnormal   Collection Time: 05/13/19  5:44 AM  Result Value Ref Range   WBC 7.5 4.0 - 10.5 K/uL   RBC 3.59 (L) 4.22 - 5.81 MIL/uL   Hemoglobin 9.0 (L) 13.0 - 17.0 g/dL   HCT 29.5 (L) 39.0 - 52.0 %   MCV 82.2 80.0 - 100.0 fL   MCH 25.1 (L) 26.0 - 34.0 pg   MCHC 30.5 30.0 - 36.0 g/dL   RDW 15.6 (H) 11.5 - 15.5 %   Platelets 389 150 - 400 K/uL   nRBC 0.0 0.0 - 0.2 %    Comment: Performed at [facility], [address] [place], [place] [address]  Troponin I - Now Then Q6H     Status: None   Collection Time: 05/13/19  5:44 AM  Result Value Ref Range   Troponin I <0.03 <0.03 ng/mL    Comment: Performed at [facility], [address], [place], [place] [address]   Ct Abdomen Pelvis W Contrast  Result Date: 05/12/2019 CLINICAL DATA:  Abdominal pain and vomiting. History of hepatic lesion resection. EXAM: CT ABDOMEN AND PELVIS WITH CONTRAST TECHNIQUE: Multidetector CT imaging of the abdomen and pelvis was performed using the standard protocol following bolus administration of intravenous contrast. CONTRAST:  168mL OMNIPAQUE IOHEXOL 300 MG/ML  SOLN COMPARISON:  CT scan 04/13/2019 FINDINGS: Lower chest: Small right pleural effusion with overlying atelectasis. The left lung is clear. There is a persistent small pericardial effusion. Hepatobiliary: Postoperative changes involving the liver with previous liver lesion resection. The gallbladder is also surgically absent. I do not see any worrisome hepatic lesions or intrahepatic biliary dilatation. Mild stable common bile duct dilatation. Pancreas: Persistent ill-defined low-attenuation in the pancreatic tail which could be chronic fibrosis. Infiltrating tumor is also possible but felt to be less likely. Recommend correlation  with any prior imaging studies that might show this to be a stable process. 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 The portal vein is patent. Spleen: Spleen is upper limits of normal in size. No splenic lesions. Adrenals/Urinary Tract: The adrenal glands and kidneys are unremarkable. No acute obstructive findings or worrisome renal lesions. No collecting system abnormalities. No ureteral or bladder calculi. No bladder mass. Mild bladder distention. Stomach/Bowel: The stomach, duodenum, small bowel and colon are grossly normal without oral contrast. No inflammatory changes, mass lesions or obstructive findings. The terminal ileum and appendix are normal. Vascular/Lymphatic: Stable atherosclerotic calcifications involving the aorta distally. No aneurysm or dissection. The branch vessels are patent. The major venous structures are patent. Small scattered mesenteric and retroperitoneal lymph nodes but no mass or overt adenopathy. Reproductive: The prostate gland and seminal vesicles are unremarkable. Other: No pelvic mass or free pelvic fluid collections. No inguinal mass or adenopathy. Musculoskeletal: No significant bony findings. IMPRESSION: 1. Persistent area of low attenuation in the pancreatic tail, possibly chronic fibrosis. Infiltrating tumor felt to be less likely. Recommend correlation with any prior imaging studies which might help demonstrate this to be a chronic process. 2. 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 3. Remote postoperative changes involving the liver from a prior hepatic lesion resection. Status post cholecystectomy also. 4. Right pleural effusion with overlying atelectasis. Electronically Signed   By: Marijo Sanes M.D.   On: 05/12/2019 16:37    Pending Labs Unresulted Labs (From admission, onward)    Start     Ordered   05/19/19 0500  Creatinine, serum   (enoxaparin (LOVENOX)    CrCl >/= 30 ml/min)  Weekly,   STAT    Comments: while on enoxaparin therapy    05/13/19 0419   05/13/19 0522  Vancomycin, random  ONCE - STAT,   STAT     05/13/19 0521   05/13/19 0420  C difficile quick scan w PCR reflex  (C Difficile quick screen w PCR reflex panel)  Once, for 24 hours,   STAT     05/13/19 0419   05/13/19 0419  Troponin I - Now Then Q6H  Now then every 6 hours,   STAT     05/13/19 0419   05/12/19 1748  Novel Coronavirus,NAA,(SEND-OUT TO REF LAB - TAT 24-48 hrs); Hosp Order  (Asymptomatic Patients Labs)  ONCE - STAT,   STAT    Question:  Rule Out  Answer:  Yes   05/12/19 1747          Vitals/Pain Today's Vitals   05/13/19 0542 05/13/19 0542 05/13/19 0730 05/13/19 0830  BP: (!) 167/122  (!) 138/104 (!) 152/107  Pulse: (!) 101  96 95  Resp: (!) 21  16 10   Temp:      TempSrc:      SpO2: 100%  98% 98%  PainSc:  8       Isolation Precautions Enteric precautions (UV disinfection)  Medications Medications  amiodarone (PACERONE) tablet 100 mg (100 mg Oral Not Given 05/13/19 1142)  colchicine tablet 0.6 mg (0.6 mg Oral Not Given 05/13/19 1142)  DULoxetine (CYMBALTA) DR capsule 30 mg (30 mg Oral Not Given 05/13/19 1142)  gabapentin (NEURONTIN) capsule 300 mg (300 mg Oral Not Given 05/13/19 1142)  neomycin-polymyxin b-dexamethasone (MAXITROL) ophthalmic ointment ( Both Eyes Not Given 05/13/19 1143)  tamsulosin (FLOMAX) capsule 0.4 mg (0.4 mg Oral Not Given 05/13/19 1143)  vancomycin IVPB (has no administration in time range)  dicyclomine (BENTYL) capsule 10 mg (10 mg Oral Not Given 05/13/19 1143)  metoCLOPramide (REGLAN) tablet 10 mg (10 mg Oral Given 05/13/19 0932)  pantoprazole (PROTONIX) EC tablet 40 mg (40 mg Oral Refused 05/13/19 1144)  sucralfate (CARAFATE) tablet 1 g (1 g Oral Refused 05/13/19 1144)  enoxaparin (LOVENOX) injection 40 mg (40 mg Subcutaneous Given 05/13/19 0601)  acetaminophen (TYLENOL) tablet 650 mg (has no administration in  time range)    Or  acetaminophen (TYLENOL) suppository 650 mg (has no administration in time range)  ondansetron (ZOFRAN) tablet 4 mg ( Oral See Alternative 05/13/19 0129)    Or  ondansetron (ZOFRAN) injection 4 mg (4 mg Intravenous Given 05/13/19 0129)  0.9 %  sodium chloride infusion ( Intravenous New Bag/Given 05/13/19 0039)  morphine 2 MG/ML injection 1 mg (1 mg Intravenous Given 05/13/19 1024)  morphine 4 MG/ML injection 4 mg (4 mg Intravenous Given 05/12/19 1439)  ondansetron (ZOFRAN) injection 4 mg (4 mg Intravenous Given 05/12/19 1437)  sodium chloride 0.9 % bolus 1,000 mL (0 mLs Intravenous Stopped 05/12/19 1909)  iohexol (OMNIPAQUE) 240 MG/ML injection 50 mL (50 mLs Oral Contrast Given 05/12/19 1451)  morphine 4 MG/ML injection 4 mg (4 mg Intravenous  Given 05/12/19 1546)  potassium chloride 10 mEq in 100 mL IVPB (0 mEq Intravenous Stopped 05/12/19 1909)  iohexol (OMNIPAQUE) 300 MG/ML solution 100 mL (100 mLs Intravenous Contrast Given 05/12/19 1555)  morphine 4 MG/ML injection 4 mg (4 mg Intravenous Given 05/12/19 1716)  ondansetron (ZOFRAN) injection 4 mg (4 mg Intravenous Given 05/12/19 1716)  HYDROmorphone (DILAUDID) injection 0.5 mg (0.5 mg Intravenous Given 05/12/19 1804)  magnesium sulfate IVPB 1 g 100 mL (0 g Intravenous Stopped 05/12/19 2024)  potassium chloride 10 mEq in 100 mL IVPB (0 mEq Intravenous Stopped 05/12/19 2137)  sodium chloride 0.9 % bolus 500 mL (0 mLs Intravenous Stopped 05/12/19 2230)  HYDROmorphone (DILAUDID) injection 0.5 mg (0.5 mg Intravenous Given 05/12/19 2229)  magnesium sulfate IVPB 2 g 50 mL (0 g Intravenous Stopped 05/13/19 0804)  HYDROmorphone (DILAUDID) injection 1 mg (1 mg Intravenous Given 05/13/19 0432)    Mobility walks Low fall risk   Focused Assessments Cardiac Assessment Handoff:    Lab Results  Component Value Date   CKTOTAL 13 (L) 04/14/2019   TROPONINI <0.03 05/13/2019   No results found for: DDIMER Does the Patient currently have chest  pain? No     R Recommendations: See Admitting Provider Note  Report given to:   Additional Notes:

## 2019-05-13 NOTE — ED Notes (Signed)
Answered pt's call bell; into room, pt sitting in bed; says "so what's the news?"; pt wanting to know if more pain medication has been ordered; understands MD has been paged and I'm waiting for a call back for further orders;

## 2019-05-13 NOTE — ED Notes (Signed)
Upon leaving room, pt was standing beside stretcher using urinal; did not want assistance or for this RN to stay in room; would not lay in bed and use urinal after Morphine-"I'll be fine"

## 2019-05-13 NOTE — ED Notes (Signed)
Answered pt's call bell; sitting up on the end of the bed; says abd pain is up to "every bit of a 9"; constant; did not describe; vomited more in emesis bag; asked pt if he's more nauseous or more pain to which he replied "equally both"; pt just voided in urinal; straightened linens and repositioned pt for comfort in bed; will call admitting MD for further pain medication orders if possible;

## 2019-05-13 NOTE — ED Notes (Signed)
Answered pt's call bell; find pt sitting up in bed holding emesis bag; pt says his stomach feels "uneasy"; understands medication not due for 2 more hours but pt says he's fine with that-doesn't want anything at this time; small amount of liquid in the bottom of emesis bag; offered pt a fresh bag which he declined; given 71ml water for pt to "swish and spit";

## 2019-05-13 NOTE — Progress Notes (Signed)
Pharmacy Antibiotic Note  Solon Alban is a 53 y.o. male admitted on 05/12/2019 with recent admission for MRSA bacteremia.  Pharmacy has been consulted for vancomycin dosing. Patient was recently changed to oral doxycycline but now severely nauseated and unable to take oral medications. Restarting IV vancomycin.   Plan: Random vancomycin level <4.  Vancomycin loading dose 1750 mg IV x1   Vancomycin 1250 mg IV Q 12 hrs. Goal AUC 400-550. Expected AUC: 476 SCr used: 0.92 Cmin: 12.7   Weight: 180 lb (81.6 kg)  Temp (24hrs), Avg:97.8 F (36.6 C), Min:97.8 F (36.6 C), Max:97.8 F (36.6 C)  Recent Labs  Lab 05/12/19 1428 05/13/19 0544 05/13/19 1157  WBC 8.4 7.5  --   CREATININE 1.26* 0.92  --   VANCORANDOM  --   --  <4    Estimated Creatinine Clearance: 102.3 mL/min (by C-G formula based on SCr of 0.92 mg/dL).    Allergies  Allergen Reactions  . Metformin And Related     Pt. States it makes his stomach bleed because of a stomach ulcer  . Motrin [Ibuprofen]     Pt. States it "inflames his stomach, causes bleeding in stomach"    Antimicrobials this admission: Vancomycin 6/21 >>  Dose adjustments this admission:   Microbiology results:  Thank you for allowing pharmacy to be a part of this patient's care.  Rocky Morel 05/13/2019 1:42 PM

## 2019-05-13 NOTE — Progress Notes (Signed)
Kenneth Copeland at [place] NAME: Kenneth Copeland    MR#:  [id number]  DATE OF BIRTH:  Mar 08, 1966  SUBJECTIVE:  CHIEF COMPLAINT:   Chief Complaint  Patient presents with  . Abdominal Pain    REVIEW OF SYSTEMS:  ROS  DRUG ALLERGIES:   Allergies  Allergen Reactions  . Metformin And Related     Pt. States it makes his stomach bleed because of a stomach ulcer  . Motrin [Ibuprofen]     Pt. States it "inflames his stomach, causes bleeding in stomach"    VITALS:  Blood pressure (!) 152/107, pulse 95, temperature 97.8 F (36.6 C), temperature source Oral, resp. rate 10, SpO2 98 %.  PHYSICAL EXAMINATION:  Physical Exam  GENERAL:  53 y.o.-year-old disheveled appearing patient lying in the bed, in acute distress secondary to pain EYES: Pupils equal, round, reactive to light and accommodation. No scleral icterus. Extraocular muscles intact.  HEENT: Head atraumatic, normocephalic. Oropharynx and nasopharynx clear.  NECK:  Supple, no jugular venous distention. No thyroid enlargement, no tenderness.  LUNGS: Normal breath sounds bilaterally, no wheezing, rales,rhonchi or crepitation. No use of accessory muscles of respiration.  Decreased bibasilar breath sounds CARDIOVASCULAR: S1, S2 normal. No murmurs, rubs, or gallops.  ABDOMEN: Soft, tender in the left upper quadrant and epigastric regions,, nondistended. Bowel sounds present. No organomegaly or mass.  EXTREMITIES: No pedal edema, cyanosis, or clubbing.  NEUROLOGIC: Cranial nerves II through XII are intact. Muscle strength 5/5 in all extremities. Sensation intact. Gait not checked.  PSYCHIATRIC: The patient is alert and oriented x 3.  SKIN: No obvious rash, lesion, or ulcer.    LABORATORY PANEL:   CBC Recent Labs  Lab 05/13/19 0544  WBC 7.5  HGB 9.0*  HCT 29.5*  PLT 389    ------------------------------------------------------------------------------------------------------------------  Chemistries  Recent Labs  Lab 05/12/19 1428 05/12/19 1513 05/13/19 0544  NA 135  --  137  K 2.9*  --  3.4*  CL 98  --  103  CO2 19*  --  20*  GLUCOSE 116*  --  140*  BUN 13  --  13  CREATININE 1.26*  --  0.92  CALCIUM 9.1  --  8.4*  MG  --  1.7  --   AST 19  --   --   ALT 16  --   --   ALKPHOS 102  --   --   BILITOT 0.4  --   --    ------------------------------------------------------------------------------------------------------------------  Cardiac Enzymes Recent Labs  Lab 05/13/19 1157  TROPONINI <0.03   ------------------------------------------------------------------------------------------------------------------  RADIOLOGY:  Ct Abdomen Pelvis W Contrast  Result Date: 05/12/2019 CLINICAL DATA:  Abdominal pain and vomiting. History of hepatic lesion resection. EXAM: CT ABDOMEN AND PELVIS WITH CONTRAST TECHNIQUE: Multidetector CT imaging of the abdomen and pelvis was performed using the standard protocol following bolus administration of intravenous contrast. CONTRAST:  12mL OMNIPAQUE IOHEXOL 300 MG/ML  SOLN COMPARISON:  CT scan 04/13/2019 FINDINGS: Lower chest: Small right pleural effusion with overlying atelectasis. The left lung is clear. There is a persistent small pericardial effusion. Hepatobiliary: Postoperative changes involving the liver with previous liver lesion resection. The gallbladder is also surgically absent. I do not see any worrisome hepatic lesions or intrahepatic biliary dilatation. Mild stable common bile duct dilatation. Pancreas: Persistent ill-defined low-attenuation in the pancreatic tail which could be chronic fibrosis. Infiltrating tumor is also possible but felt to be less likely. Recommend correlation with any prior imaging studies  that might show this to be a stable process. 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 The portal vein is patent. Spleen: Spleen is upper limits of normal in size. No splenic lesions. Adrenals/Urinary Tract: The adrenal glands and kidneys are unremarkable. No acute obstructive findings or worrisome renal lesions. No collecting system abnormalities. No ureteral or bladder calculi. No bladder mass. Mild bladder distention. Stomach/Bowel: The stomach, duodenum, small bowel and colon are grossly normal without oral contrast. No inflammatory changes, mass lesions or obstructive findings. The terminal ileum and appendix are normal. Vascular/Lymphatic: Stable atherosclerotic calcifications involving the aorta distally. No aneurysm or dissection. The branch vessels are patent. The major venous structures are patent. Small scattered mesenteric and retroperitoneal lymph nodes but no mass or overt adenopathy. Reproductive: The prostate gland and seminal vesicles are unremarkable. Other: No pelvic mass or free pelvic fluid collections. No inguinal mass or adenopathy. Musculoskeletal: No significant bony findings. IMPRESSION: 1. Persistent area of low attenuation in the pancreatic tail, possibly chronic fibrosis. Infiltrating tumor felt to be less likely. Recommend correlation with any prior imaging studies which might help demonstrate this to be a chronic process. 2. 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 3. Remote postoperative changes involving the liver from a prior hepatic lesion resection. Status post cholecystectomy also. 4. Right pleural effusion with overlying atelectasis. Electronically Signed   By: Marijo Sanes M.D.   On: 05/12/2019 16:37    EKG:   Orders placed or performed during the hospital encounter of 05/12/19  . ED EKG  . ED EKG  . EKG 12-Lead  . EKG 12-Lead  . EKG 12-Lead  . 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  1.  Acute on chronic gastroparesis-IV fluids -Started on IV Reglan scheduled.  Status post CT abdomen which did not show any acute findings.  Continue supportive management at this time.  2.  Hypokalemia-being replaced  3.  Recent admission for MRSA bacteremia on Apr 08, 2019-patient was supposed to be on IV vancomycin until 05/24/2019.  He had MRSA bacteremia with negative TEE but slight pericardial effusion unsure of pericarditis secondary to MRSA at the time as pericardiocentesis was not done. -Patient was recently changed to oral doxycycline as he was leaving the rehab on 05/07/2019.  Now he is severely nauseous and unable to take any oral medications.  Will start on IV vancomycin again.  4.  Acute renal failure-resolved with IV fluids.  Continue to monitor  5.  Diabetes mellitus-recent A1c was 10.5.  Currently patient is significantly nauseous and unable to keep anything down and n.p.o.  Monitor on sliding scale insulin.  6.  DVT prophylaxis-Lovenox   All the records are reviewed and case discussed with Care Management/Social Workerr. Management plans discussed with the patient, family and they are in agreement.  CODE STATUS: Full Code  TOTAL TIME TAKING CARE OF THIS PATIENT: 38 minutes.   POSSIBLE D/C IN 1-2 DAYS, DEPENDING ON CLINICAL CONDITION.   Gladstone Lighter M.D on 05/13/2019 at 12:31 PM  Between 7am to 6pm - Pager - [phone]  After 6pm go to [email or web address] - password [email or web address]  [employer]  [phone]  CC: Primary care physician; Patient, No Pcp Per

## 2019-05-13 NOTE — ED Notes (Addendum)
In to give pt his medications ordered earlier; pt sleeping soundly upon my arrival; after waking pt up, he c/o pain and nausea; says he can't take any pills at this time; says he vomited earlier; pt laying in bed with empty emesis bag on his stomach; requesting medication at this time; order for Zofran already entered by admitting MD; only pain medication is Tylenol; pt says he's been getting Dilaudid and Tylenol won't do anything for him; will page admitting MD:

## 2019-05-14 LAB — BASIC METABOLIC PANEL
Anion gap: 8 (ref 5–15)
BUN: 13 mg/dL (ref 6–20)
CO2: 25 mmol/L (ref 22–32)
Calcium: 7.8 mg/dL — ABNORMAL LOW (ref 8.9–10.3)
Chloride: 104 mmol/L (ref 98–111)
Creatinine, Ser: 1 mg/dL (ref 0.61–1.24)
GFR calc Af Amer: >60 mL/min (ref >60)
GFR calc non Af Amer: >60 mL/min (ref >60)
Glucose, Bld: 116 mg/dL — ABNORMAL HIGH (ref 70–99)
Potassium: 3.3 mmol/L — ABNORMAL LOW (ref 3.5–5.1)
Sodium: 137 mmol/L (ref 135–145)

## 2019-05-14 LAB — GLUCOSE, CAPILLARY
Glucose-Capillary: 114 mg/dL — ABNORMAL HIGH (ref 70–99)
Glucose-Capillary: 225 mg/dL — ABNORMAL HIGH (ref 70–99)
Glucose-Capillary: 215 mg/dL — ABNORMAL HIGH (ref 70–99)
Glucose-Capillary: 202 mg/dL — ABNORMAL HIGH (ref 70–99)

## 2019-05-14 MED ORDER — POTASSIUM CHLORIDE CRYS ER 20 MEQ PO TBCR
40.0000 meq | EXTENDED_RELEASE_TABLET | Freq: Once | ORAL | Status: AC
  Administered 2019-05-16: 09:00:00 40 meq via ORAL
  Filled 2019-05-14 (×2): qty 2

## 2019-05-14 MED ORDER — VANCOMYCIN HCL 1.25 G IV SOLR
1250.0000 mg | Freq: Two times a day (BID) | INTRAVENOUS | Status: DC
  Administered 2019-05-14 – 2019-05-17 (×6): 1250 mg via INTRAVENOUS
  Filled 2019-05-14 (×7): qty 1250

## 2019-05-14 MED ORDER — INSULIN ASPART 100 UNIT/ML ~~LOC~~ SOLN
0.0000 [IU] | Freq: Three times a day (TID) | SUBCUTANEOUS | Status: DC
  Administered 2019-05-14 (×2): 3 [IU] via SUBCUTANEOUS
  Administered 2019-05-15: 08:00:00 2 [IU] via SUBCUTANEOUS
  Administered 2019-05-15: 12:00:00 3 [IU] via SUBCUTANEOUS
  Filled 2019-05-14 (×4): qty 1

## 2019-05-14 MED ORDER — INSULIN ASPART 100 UNIT/ML ~~LOC~~ SOLN: 0.0000 [IU] | Freq: Every day | SUBCUTANEOUS | Status: DC

## 2019-05-14 MED ORDER — HYDRALAZINE HCL 20 MG/ML IJ SOLN
10.0000 mg | Freq: Four times a day (QID) | INTRAMUSCULAR | Status: DC | PRN
  Administered 2019-05-14 – 2019-05-15 (×2): 10 mg via INTRAVENOUS
  Filled 2019-05-14 (×3): qty 1

## 2019-05-14 NOTE — Progress Notes (Signed)
[facility] [place] NAME: Kenneth Copeland    MR#:  [id number]  DATE OF BIRTH:  1966-01-18  SUBJECTIVE:  CHIEF COMPLAINT:   Chief Complaint  Patient presents with  . Abdominal Pain   - feels miserable. Still nauseous and throwing up  REVIEW OF SYSTEMS:  Review of Systems  Constitutional: Negative for chills, fever and malaise/fatigue.  HENT: Negative for congestion, ear discharge, hearing loss and nosebleeds.   Eyes: Negative for blurred vision and double vision.  Respiratory: Negative for cough, shortness of breath and wheezing.   Cardiovascular: Negative for chest pain, palpitations and leg swelling.  Gastrointestinal: Positive for abdominal pain, nausea and vomiting. Negative for constipation and diarrhea.  Genitourinary: Negative for dysuria.  Musculoskeletal: Negative for myalgias.  Neurological: Negative for dizziness, focal weakness, seizures, weakness and headaches.  Psychiatric/Behavioral: Negative for depression.    DRUG ALLERGIES:   Allergies  Allergen Reactions  . Metformin And Related     Pt. States it makes his stomach bleed because of a stomach ulcer  . Motrin [Ibuprofen]     Pt. States it "inflames his stomach, causes bleeding in stomach"    VITALS:  Blood pressure 121/89, pulse 96, temperature 98.6 F (37 C), temperature source Oral, resp. rate 18, weight 81.6 kg, SpO2 97 %.  PHYSICAL EXAMINATION:  Physical Exam  GENERAL:  53 y.o.-year-old disheveled appearing patient lying in the bed, in acute distress secondary to pain EYES: Pupils equal, round, reactive to light and accommodation. No scleral icterus. Extraocular muscles intact.  HEENT: Head atraumatic, normocephalic. Oropharynx and nasopharynx clear.  NECK:  Supple, no jugular venous distention. No thyroid enlargement, no tenderness.  LUNGS: Normal breath sounds bilaterally, no wheezing, rales,rhonchi or crepitation. No use of accessory muscles of  respiration.  Decreased bibasilar breath sounds CARDIOVASCULAR: S1, S2 normal. No murmurs, rubs, or gallops.  ABDOMEN: Soft, tender in the left upper quadrant and epigastric regions,, nondistended. Bowel sounds present. No organomegaly or mass.  EXTREMITIES: No pedal edema, cyanosis, or clubbing.  NEUROLOGIC: Cranial nerves II through XII are intact. Muscle strength 5/5 in all extremities. Sensation intact. Gait not checked.  PSYCHIATRIC: The patient is alert and oriented x 3.  SKIN: No obvious rash, lesion, or ulcer.    LABORATORY PANEL:   CBC Recent Labs  Lab 05/13/19 0544  WBC 7.5  HGB 9.0*  HCT 29.5*  PLT 389   ------------------------------------------------------------------------------------------------------------------  Chemistries  Recent Labs  Lab 05/12/19 1428 05/12/19 1513  05/14/19 0517  NA 135  --    < > 137  K 2.9*  --    < > 3.3*  CL 98  --    < > 104  CO2 19*  --    < > 25  GLUCOSE 116*  --    < > 116*  BUN 13  --    < > 13  CREATININE 1.26*  --    < > 1.00  CALCIUM 9.1  --    < > 7.8*  MG  --  1.7  --   --   AST 19  --   --   --   ALT 16  --   --   --   ALKPHOS 102  --   --   --   BILITOT 0.4  --   --   --    < > = values in this interval not displayed.   ------------------------------------------------------------------------------------------------------------------  Cardiac Enzymes Recent Labs  Lab 05/13/19 1502  TROPONINI <0.03   ------------------------------------------------------------------------------------------------------------------  RADIOLOGY:  Ct Abdomen Pelvis W Contrast  Result Date: 05/12/2019 CLINICAL DATA:  Abdominal pain and vomiting. History of hepatic lesion resection. EXAM: CT ABDOMEN AND PELVIS WITH CONTRAST TECHNIQUE: Multidetector CT imaging of the abdomen and pelvis was performed using the standard protocol following bolus administration of intravenous contrast. CONTRAST:  189mL OMNIPAQUE IOHEXOL 300 MG/ML  SOLN  COMPARISON:  CT scan 04/13/2019 FINDINGS: Lower chest: Small right pleural effusion with overlying atelectasis. The left lung is clear. There is a persistent small pericardial effusion. Hepatobiliary: Postoperative changes involving the liver with previous liver lesion resection. The gallbladder is also surgically absent. I do not see any worrisome hepatic lesions or intrahepatic biliary dilatation. Mild stable common bile duct dilatation. Pancreas: Persistent ill-defined low-attenuation in the pancreatic tail which could be chronic fibrosis. Infiltrating tumor is also possible but felt to be less likely. Recommend correlation with any prior imaging studies that might show this to be a stable process. 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 The portal vein is patent. Spleen: Spleen is upper limits of normal in size. No splenic lesions. Adrenals/Urinary Tract: The adrenal glands and kidneys are unremarkable. No acute obstructive findings or worrisome renal lesions. No collecting system abnormalities. No ureteral or bladder calculi. No bladder mass. Mild bladder distention. Stomach/Bowel: The stomach, duodenum, small bowel and colon are grossly normal without oral contrast. No inflammatory changes, mass lesions or obstructive findings. The terminal ileum and appendix are normal. Vascular/Lymphatic: Stable atherosclerotic calcifications involving the aorta distally. No aneurysm or dissection. The branch vessels are patent. The major venous structures are patent. Small scattered mesenteric and retroperitoneal lymph nodes but no mass or overt adenopathy. Reproductive: The prostate gland and seminal vesicles are unremarkable. Other: No pelvic mass or free pelvic fluid collections. No inguinal mass or adenopathy. Musculoskeletal: No significant bony findings. IMPRESSION: 1. Persistent area of low attenuation in the pancreatic tail, possibly chronic  fibrosis. Infiltrating tumor felt to be less likely. Recommend correlation with any prior imaging studies which might help demonstrate this to be a chronic process. 2. 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 3. Remote postoperative changes involving the liver from a prior hepatic lesion resection. Status post cholecystectomy also. 4. Right pleural effusion with overlying atelectasis. Electronically Signed   By: Marijo Sanes M.D.   On: 05/12/2019 16:37    EKG:   Orders placed or performed during the hospital encounter of 05/12/19  . ED EKG  . ED EKG  . EKG 12-Lead  . EKG 12-Lead  . EKG 12-Lead  . 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  1.  Acute on chronic gastroparesis-IV fluids - on IV Reglan scheduled.  Status post CT abdomen which did not show any acute findings.  Continue supportive management at this time.  2.  Hypokalemia- replaced  3.  Recent admission for MRSA bacteremia on Apr 08, 2019 - patient was supposed to be on IV vancomycin until 05/24/2019. -  He had MRSA bacteremia with negative TEE but slight pericardial effusion unsure of pericarditis secondary to MRSA at the time as pericardiocentesis was not done. -Patient was  recently changed to oral doxycycline as he was leaving the rehab on 05/07/2019.  Now he is severely nauseous and unable to take any oral medications.  Will start on IV vancomycin again.  4.  Acute renal failure-resolved with IV fluids.  Continue to monitor  5.  Diabetes mellitus-recent A1c was 10.5. will start sliding scale insulin  6.  DVT prophylaxis-Lovenox    All the records  are reviewed and case discussed with Care Management/Social Workerr. Management plans discussed with the patient, family and they are in agreement.  CODE STATUS: Full Code  TOTAL TIME TAKING CARE OF THIS PATIENT: 38 minutes.   POSSIBLE D/C IN 1-2 DAYS, DEPENDING ON CLINICAL CONDITION.   Gladstone Lighter M.D on 05/14/2019 at 8:16 AM  Between 7am to 6pm - Pager - [phone]  After 6pm go to [email or web address] - password [email or web address] [facility]  Office  [phone]  CC: Primary care physician; Patient, No Pcp Per

## 2019-05-14 NOTE — Progress Notes (Signed)
On assessment Pt had elevated BP, 141/107. [facility] notified, new orders placed and implemented  Pt BP lowered to 123/95

## 2019-05-15 ENCOUNTER — Inpatient Hospital Stay: Payer: Medicare Other

## 2019-05-15 LAB — HEMOGLOBIN A1C
Hgb A1c MFr Bld: 9 % — ABNORMAL HIGH (ref 4.8–5.6)
Mean Plasma Glucose: 212 mg/dL

## 2019-05-15 LAB — BASIC METABOLIC PANEL
Anion gap: 14 (ref 5–15)
BUN: 10 mg/dL (ref 6–20)
CO2: 23 mmol/L (ref 22–32)
Calcium: 8.3 mg/dL — ABNORMAL LOW (ref 8.9–10.3)
Chloride: 96 mmol/L — ABNORMAL LOW (ref 98–111)
Creatinine, Ser: 0.94 mg/dL (ref 0.61–1.24)
GFR calc Af Amer: >60 mL/min (ref >60)
GFR calc non Af Amer: >60 mL/min (ref >60)
Glucose, Bld: 200 mg/dL — ABNORMAL HIGH (ref 70–99)
Potassium: 3.3 mmol/L — ABNORMAL LOW (ref 3.5–5.1)
Sodium: 133 mmol/L — ABNORMAL LOW (ref 135–145)

## 2019-05-15 LAB — GLUCOSE, CAPILLARY
Glucose-Capillary: 118 mg/dL — ABNORMAL HIGH (ref 70–99)
Glucose-Capillary: 172 mg/dL — ABNORMAL HIGH (ref 70–99)
Glucose-Capillary: 207 mg/dL — ABNORMAL HIGH (ref 70–99)
Glucose-Capillary: 219 mg/dL — ABNORMAL HIGH (ref 70–99)
Glucose-Capillary: 231 mg/dL — ABNORMAL HIGH (ref 70–99)
Glucose-Capillary: 249 mg/dL — ABNORMAL HIGH (ref 70–99)

## 2019-05-15 LAB — NOVEL CORONAVIRUS, NAA (HOSP ORDER, SEND-OUT TO REF LAB; TAT 18-24 HRS): SARS-CoV-2, NAA: NOT DETECTED

## 2019-05-15 LAB — MRSA PCR SCREENING: MRSA by PCR: NEGATIVE

## 2019-05-15 MED ORDER — OXYCODONE-ACETAMINOPHEN 5-325 MG PO TABS
1.0000 | ORAL_TABLET | ORAL | Status: DC | PRN
  Administered 2019-05-17 (×2): 2 via ORAL
  Filled 2019-05-15: qty 1
  Filled 2019-05-15: qty 2
  Filled 2019-05-15: qty 1

## 2019-05-15 MED ORDER — MORPHINE SULFATE (PF) 2 MG/ML IV SOLN
2.0000 mg | Freq: Once | INTRAVENOUS | Status: AC
  Administered 2019-05-15: 04:00:00 2 mg via INTRAVENOUS
  Filled 2019-05-15: qty 1

## 2019-05-15 MED ORDER — CLONIDINE HCL 0.1 MG/24HR TD PTWK
0.1000 mg | MEDICATED_PATCH | TRANSDERMAL | Status: DC
  Administered 2019-05-15: 0.1 mg via TRANSDERMAL
  Filled 2019-05-15: qty 1

## 2019-05-15 MED ORDER — INSULIN ASPART 100 UNIT/ML ~~LOC~~ SOLN
0.0000 [IU] | SUBCUTANEOUS | Status: DC
  Administered 2019-05-15: 5 [IU] via SUBCUTANEOUS
  Administered 2019-05-15: 8 [IU] via SUBCUTANEOUS
  Administered 2019-05-16 (×2): 5 [IU] via SUBCUTANEOUS
  Filled 2019-05-15 (×4): qty 1

## 2019-05-15 NOTE — Progress Notes (Signed)
[facility] [place] NAME: Kenneth Copeland    MR#:  [id number]  DATE OF BIRTH:  1966/10/11  SUBJECTIVE:  CHIEF COMPLAINT:   Chief Complaint  Patient presents with  . Abdominal Pain   -Feels miserable.  Continues to have significant abdominal pain, nausea and vomiting. -No other symptoms  REVIEW OF SYSTEMS:  Review of Systems  Constitutional: Negative for chills, fever and malaise/fatigue.  HENT: Negative for congestion, ear discharge, hearing loss and nosebleeds.   Eyes: Negative for blurred vision and double vision.  Respiratory: Negative for cough, shortness of breath and wheezing.   Cardiovascular: Negative for chest pain, palpitations and leg swelling.  Gastrointestinal: Positive for abdominal pain, nausea and vomiting. Negative for constipation and diarrhea.  Genitourinary: Negative for dysuria.  Musculoskeletal: Negative for myalgias.  Neurological: Negative for dizziness, focal weakness, seizures, weakness and headaches.  Psychiatric/Behavioral: Negative for depression.    DRUG ALLERGIES:   Allergies  Allergen Reactions  . Metformin And Related     Pt. States it makes his stomach bleed because of a stomach ulcer  . Motrin [Ibuprofen]     Pt. States it "inflames his stomach, causes bleeding in stomach"    VITALS:  Blood pressure (!) 151/107, pulse (!) 109, temperature 98.2 F (36.8 C), temperature source Oral, resp. rate 16, weight 81.6 kg, SpO2 97 %.  PHYSICAL EXAMINATION:  Physical Exam  GENERAL:  53 y.o.-year-old disheveled appearing patient lying in the bed, in acute distress secondary to pain and nausea EYES: Pupils equal, round, reactive to light and accommodation. No scleral icterus. Extraocular muscles intact.  HEENT: Head atraumatic, normocephalic. Oropharynx and nasopharynx clear.  NECK:  Supple, no jugular venous distention. No thyroid enlargement, no tenderness.  LUNGS: Normal breath sounds bilaterally,  no wheezing, rales,rhonchi or crepitation. No use of accessory muscles of respiration.  Decreased bibasilar breath sounds CARDIOVASCULAR: S1, S2 normal. No murmurs, rubs, or gallops.  ABDOMEN: Soft, tender in the left upper quadrant and epigastric regions,, nondistended. Bowel sounds present. No organomegaly or mass.  EXTREMITIES: No pedal edema, cyanosis, or clubbing.  NEUROLOGIC: Cranial nerves II through XII are intact. Muscle strength 5/5 in all extremities. Sensation intact. Gait not checked.  PSYCHIATRIC: The patient is alert and oriented x 3.  SKIN: No obvious rash, lesion, or ulcer.    LABORATORY PANEL:   CBC Recent Labs  Lab 05/13/19 0544  WBC 7.5  HGB 9.0*  HCT 29.5*  PLT 389   ------------------------------------------------------------------------------------------------------------------  Chemistries  Recent Labs  Lab 05/12/19 1428 05/12/19 1513  05/15/19 0355  NA 135  --    < > 133*  K 2.9*  --    < > 3.3*  CL 98  --    < > 96*  CO2 19*  --    < > 23  GLUCOSE 116*  --    < > 200*  BUN 13  --    < > 10  CREATININE 1.26*  --    < > 0.94  CALCIUM 9.1  --    < > 8.3*  MG  --  1.7  --   --   AST 19  --   --   --   ALT 16  --   --   --   ALKPHOS 102  --   --   --   BILITOT 0.4  --   --   --    < > = values in this interval  not displayed.   ------------------------------------------------------------------------------------------------------------------  Cardiac Enzymes Recent Labs  Lab 05/13/19 1502  TROPONINI <0.03   ------------------------------------------------------------------------------------------------------------------  RADIOLOGY:  No results found.  EKG:   Orders placed or performed during the hospital encounter of 05/12/19  . ED EKG  . ED EKG  . EKG 12-Lead  . EKG 12-Lead  . EKG 12-Lead  . EKG 12-Lead    ASSESSMENT AND PLAN:   53 year old male with past medical history significant for intrahepatic cholangiocarcinoma status  post partial hepatectomy in 2018 and status post chemotherapy, poorly controlled diabetes mellitus, history of gastroparesis, prior history of IVDA presents from home secondary to worsening abdominal pain and nausea vomiting.  1.  Acute on chronic gastroparesis-still significantly nauseous and throwing up.  Continue IV fluids - on IV Reglan scheduled.  - Status post CT abdomen which did not show any acute findings on admission.  Continue supportive management at this time. -Repeat KUB today.  Clear liquids started.  If no improvement in nausea vomiting, consider NG tube if needed.  2.  Hypokalemia- replaced  3.  Recent admission for MRSA bacteremia on Apr 08, 2019 - patient was supposed to be on IV vancomycin until 05/24/2019. -  He had MRSA bacteremia with negative TEE but slight pericardial effusion unsure of pericarditis secondary to MRSA at the time as pericardiocentesis was not done. -Patient was recently changed to oral doxycycline as he was leaving the rehab on 05/07/2019.  Now he is severely nauseous and unable to take any oral medications.  Restarted on IV vancomycin again.  4.  Acute renal failure-resolved with IV fluids.  Continue to monitor  5.  Diabetes mellitus-recent A1c was 10.5. on sliding scale insulin while n.p.o.  6.  DVT prophylaxis-Lovenox   Patient is independent at baseline   All the records are reviewed and case discussed with Care Management/Social Workerr. Management plans discussed with the patient, family and they are in agreement.  CODE STATUS: Full Code  TOTAL TIME TAKING CARE OF THIS PATIENT: 38 minutes.   POSSIBLE D/C IN 1-2 DAYS, DEPENDING ON CLINICAL CONDITION.   Gladstone Lighter M.D on 05/15/2019 at 10:37 AM  Between 7am to 6pm - Pager - [phone]  After 6pm go to [email or web address] - password [email or web address] [facility]  Office  [phone]  CC: Primary care physician; Patient, No Pcp Per

## 2019-05-15 NOTE — Progress Notes (Signed)
Inpatient Diabetes Program Recommendations  AACE/ADA: New Consensus Statement on Inpatient Glycemic Control (2015)  Target Ranges:  Prepandial:   less than 140 mg/dL      Peak postprandial:   less than 180 mg/dL (1-2 hours)      Critically ill patients:  140 - 180 mg/dL    Results for Kenneth Copeland, Kenneth Copeland (MRN [id number]) as of 05/15/2019 11:36  Ref. Range 05/14/2019 07:25 05/14/2019 11:46 05/14/2019 17:13 05/14/2019 21:13  Glucose-Capillary Latest Ref Range: 70 - 99 mg/dL 114 (H) 225 (H)  3 units NOVOLOG  215 (H)  3 units NOVOLOG  202 (H)   Results for Kenneth Copeland, Kenneth Copeland (MRN [id number]) as of 05/15/2019 11:36  Ref. Range 05/15/2019 07:35 05/15/2019 11:31  Glucose-Capillary Latest Ref Range: 70 - 99 mg/dL 172 (H)  2 units NOVOLOG  231 (H)  3 units NOVOLOG       Results for Kenneth Copeland, Kenneth Copeland (MRN [id number]) as of 05/15/2019 11:36  Ref. Range 04/18/2019 01:40 05/14/2019 05:17  Hemoglobin A1C Latest Ref Range: 4.8 - 5.6 % 10.5 (H) 9.0 (H)  (212 mg/dl)     Admit with: Gastroparesis/ Hypokalemia  History: DM, Pancreatitis, Liver Cancer  Home DM Meds: Lantus 20 units BID       Novolog 5 units TID with meals  Current Orders: Novolog Sensitive Correction Scale/ SSI (0-9 units) TID AC + HS      Per MD notes, pt continues to have abd pain with N&V.  Current A1c down a bit from the last A1c drawn in May.    MD- Please consider the following in-hospital insulin adjustments:  Increase Novolog SSI to the Moderate scale (0-15 units) Q4 hours    --Will follow patient during hospitalization--  Wyn Quaker RN, MSN, CDE Diabetes Coordinator Inpatient Glycemic Control Team Team Pager: [phone] (8a-5p)

## 2019-05-15 NOTE — Care Management Important Message (Signed)
Important Message  Patient Details  Name: Kenneth Copeland MRN: [id number] Date of Birth: August 05, 1966   Medicare Important Message Given:  Yes     Juliann Pulse A [name] 05/15/2019, 10:44 AM

## 2019-05-15 NOTE — Progress Notes (Signed)
   05/15/19 0820  Vitals  BP (!) 151/107  MAP (mmHg) 121  BP Method Automatic  Pulse Rate (!) 109  MEWS Score  MEWS RR 0  MEWS Pulse 1  MEWS Systolic 0  MEWS LOC 0  MEWS Temp 0  MEWS Score 1  MEWS Score Color Green  Dr. Tressia Miners made aware of patient current BP.  New orders received.  Clarise Cruz, BSN

## 2019-05-16 LAB — GLUCOSE, CAPILLARY
Glucose-Capillary: 216 mg/dL — ABNORMAL HIGH (ref 70–99)
Glucose-Capillary: 205 mg/dL — ABNORMAL HIGH (ref 70–99)
Glucose-Capillary: 226 mg/dL — ABNORMAL HIGH (ref 70–99)
Glucose-Capillary: 235 mg/dL — ABNORMAL HIGH (ref 70–99)
Glucose-Capillary: 281 mg/dL — ABNORMAL HIGH (ref 70–99)

## 2019-05-16 LAB — BASIC METABOLIC PANEL
Anion gap: 10 (ref 5–15)
BUN: 8 mg/dL (ref 6–20)
CO2: 24 mmol/L (ref 22–32)
Calcium: 8.2 mg/dL — ABNORMAL LOW (ref 8.9–10.3)
Chloride: 98 mmol/L (ref 98–111)
Creatinine, Ser: 0.99 mg/dL (ref 0.61–1.24)
GFR calc Af Amer: >60 mL/min (ref >60)
GFR calc non Af Amer: >60 mL/min (ref >60)
Glucose, Bld: 223 mg/dL — ABNORMAL HIGH (ref 70–99)
Potassium: 2.8 mmol/L — ABNORMAL LOW (ref 3.5–5.1)
Sodium: 132 mmol/L — ABNORMAL LOW (ref 135–145)

## 2019-05-16 MED ORDER — INSULIN GLARGINE 100 UNIT/ML ~~LOC~~ SOLN
10.0000 [IU] | Freq: Every day | SUBCUTANEOUS | Status: DC
  Administered 2019-05-16: 13:00:00 10 [IU] via SUBCUTANEOUS
  Filled 2019-05-16 (×2): qty 0.1

## 2019-05-16 MED ORDER — POTASSIUM CHLORIDE 20 MEQ PO PACK
20.0000 meq | PACK | Freq: Two times a day (BID) | ORAL | Status: DC
  Filled 2019-05-16: qty 1

## 2019-05-16 MED ORDER — POTASSIUM CHLORIDE CRYS ER 20 MEQ PO TBCR
40.0000 meq | EXTENDED_RELEASE_TABLET | Freq: Two times a day (BID) | ORAL | Status: DC
  Administered 2019-05-16 – 2019-05-17 (×2): 40 meq via ORAL
  Filled 2019-05-16: qty 4
  Filled 2019-05-16: qty 2

## 2019-05-16 MED ORDER — INSULIN ASPART 100 UNIT/ML ~~LOC~~ SOLN
0.0000 [IU] | Freq: Three times a day (TID) | SUBCUTANEOUS | Status: DC
  Administered 2019-05-16 – 2019-05-17 (×3): 5 [IU] via SUBCUTANEOUS
  Administered 2019-05-17: 3 [IU] via SUBCUTANEOUS
  Filled 2019-05-16 (×4): qty 1

## 2019-05-16 NOTE — TOC Initial Note (Signed)
Transition of Care [facility]) - Initial/Assessment Note    Patient Details  Name: Kenneth Copeland MRN: [id number] Date of Birth: 1965/12/17  Transition of Care [facility]) CM/SW Contact:    Shelbie Hutching, RN Phone Number: 05/16/2019, 2:30 PM  Clinical Narrative:                 Patient admitted with gastroparesis related to DM.  Patient reports to [facility] that he recently moved to [facility] from [place].  Patient is currently staying at the [facility] with his girlfriend until they find a residence.  Patient does not drive and neither does his girlfriend.  Resources for transportation given:  [employer], Link.  Resource list for [facility] also given.  Patient reports that he has a PCP appointment with a [facility] MD for July 20th.  Patient uses Product/process development scientist.  RNCM will cont to follow until discharge.   Expected Discharge Plan: Home/Self Care Barriers to Discharge: Continued Medical Work up   Patient Goals and CMS Choice        Expected Discharge Plan and Services Expected Discharge Plan: Home/Self Care   Discharge Planning Services: CM Consult   Living arrangements for the past 2 months: Hotel/Motel([employer])                                      Prior Living Arrangements/Services Living arrangements for the past 2 months: Hotel/Motel([employer]) Lives with:: Significant Other Patient language and need for interpreter reviewed:: No Do you feel safe going back to the place where you live?: Yes      Need for Family Participation in Patient Care: Yes (Comment) Care giver support system in place?: Yes (comment)(girlfriend)   Criminal Activity/Legal Involvement Pertinent to Current Situation/Hospitalization: No - Comment as needed  Activities of Daily Living Home Assistive Devices/Equipment: Eyeglasses, Contact lenses, CBG Meter ADL Screening (condition at time of admission) Patient's cognitive ability adequate to safely complete daily activities?: Yes Is the patient deaf or have  difficulty hearing?: No Does the patient have difficulty seeing, even when wearing glasses/contacts?: Yes Does the patient have difficulty concentrating, remembering, or making decisions?: No Patient able to express need for assistance with ADLs?: Yes Does the patient have difficulty dressing or bathing?: No Independently performs ADLs?: Yes (appropriate for developmental age) Does the patient have difficulty walking or climbing stairs?: No Weakness of Legs: Both Weakness of Arms/Hands: None  Permission Sought/Granted                  Emotional Assessment Appearance:: Appears older than stated age Attitude/Demeanor/Rapport: Engaged Affect (typically observed): Accepting Orientation: : Oriented to Self, Oriented to Place, Oriented to  Time, Oriented to Situation Alcohol / Substance Use: Not Applicable Psych Involvement: No (comment)  Admission diagnosis:  Nausea vomiting and diarrhea [R11.2, R19.7] Pain of upper abdomen [R10.10] Patient Active Problem List   Diagnosis Date Noted  . Gastroparesis due to DM ([place]) 05/12/2019  . Acute respiratory failure ([place])   . Chest pain 04/08/2019  . Pericardial effusion 04/02/2019   PCP:  Patient, No Pcp Per Pharmacy:   [facility] [address], [place] [address] Phone: [phone] Fax: [phone]     Social Determinants of Health (SDOH) Interventions    Readmission Risk Interventions Readmission Risk Prevention Plan 04/12/2019  Transportation Screening Complete  Home Care Screening Complete  Medication Review (RN CM) Complete

## 2019-05-16 NOTE — Progress Notes (Signed)
Inpatient Diabetes Program Recommendations  AACE/ADA: New Consensus Statement on Inpatient Glycemic Control  Target Ranges:  Prepandial:   less than 140 mg/dL      Peak postprandial:   less than 180 mg/dL (1-2 hours)      Critically ill patients:  140 - 180 mg/dL   Results for TOMI, PADDOCK (MRN [id number]) as of 05/16/2019 10:23  Ref. Range 05/15/2019 07:35 05/15/2019 11:31 05/15/2019 17:02 05/15/2019 20:01 05/15/2019 21:04 05/15/2019 23:48 05/16/2019 04:00 05/16/2019 07:37  Glucose-Capillary Latest Ref Range: 70 - 99 mg/dL 172 (H)  Novolog 2 units 231 (H)  Novolog 3 units 249 (H)  Novolog 3 units 207 (H)  Novolog 5 units 219 (H) 118 (H) 216 (H)  Novolog 5 units 205 (H)  Novolog 5 units   Review of Glycemic Control  Diabetes history: DM2 Outpatient Diabetes medications: Lantus 20 units BID, Novolog 5 units TID with meals Current orders for Inpatient glycemic control: Novolog 0-15 units Q4H  Inpatient Diabetes Program Recommendations:   Insulin - Basal: Please consider ordering Lantus 10 units Q24H.  Thanks, Kenneth Alderman, RN, MSN, CDE Diabetes Coordinator Inpatient Diabetes Program [phone] (Team Pager from 8am to 5pm)

## 2019-05-16 NOTE — Progress Notes (Signed)
[facility] [place] NAME: Kenneth Copeland    MR#:  [id number]  DATE OF BIRTH:  1966/02/01  SUBJECTIVE:  CHIEF COMPLAINT:   Chief Complaint  Patient presents with  . Abdominal Pain   -States that nausea improved, vomiting improved, feels better, tolerating the diet today.  Denies any abdominal pain. REVIEW OF SYSTEMS:  Review of Systems  Constitutional: Negative for chills, fever and malaise/fatigue.  HENT: Negative for congestion, ear discharge, hearing loss and nosebleeds.   Eyes: Negative for blurred vision and double vision.  Respiratory: Negative for cough, shortness of breath and wheezing.   Cardiovascular: Negative for chest pain, palpitations and leg swelling.  Gastrointestinal: Negative for abdominal pain, constipation, diarrhea, nausea and vomiting.  Genitourinary: Negative for dysuria.  Musculoskeletal: Negative for myalgias.  Neurological: Negative for dizziness, focal weakness, seizures, weakness and headaches.  Psychiatric/Behavioral: Negative for depression.    DRUG ALLERGIES:   Allergies  Allergen Reactions  . Metformin And Related     Pt. States it makes his stomach bleed because of a stomach ulcer  . Motrin [Ibuprofen]     Pt. States it "inflames his stomach, causes bleeding in stomach"    VITALS:  Blood pressure (!) 136/102, pulse 99, temperature 98.1 F (36.7 C), temperature source Oral, resp. rate 20, weight 81.6 kg, SpO2 98 %.  PHYSICAL EXAMINATION:  Physical Exam  GENERAL:  53 y.o.-year-old disheveled appearing patient lying in the bed, in acute distress secondary to pain and nausea EYES: Pupils equal, round, reactive to light and accommodation. No scleral icterus. Extraocular muscles intact.  HEENT: Head atraumatic, normocephalic. Oropharynx and nasopharynx clear.  NECK:  Supple, no jugular venous distention. No thyroid enlargement, no tenderness.  LUNGS: Normal breath sounds bilaterally, no wheezing,  rales,rhonchi or crepitation. No use of accessory muscles of respiration.  Decreased bibasilar breath sounds CARDIOVASCULAR: S1, S2 normal. No murmurs, rubs, or gallops.  ABDOMEN: Soft, tender in the left upper quadrant and epigastric regions,, nondistended. Bowel sounds present. No organomegaly or mass.  EXTREMITIES: No pedal edema, cyanosis, or clubbing.  NEUROLOGIC: Cranial nerves II through XII are intact. Muscle strength 5/5 in all extremities. Sensation intact. Gait not checked.  PSYCHIATRIC: The patient is alert and oriented x 3.  SKIN: No obvious rash, lesion, or ulcer.    LABORATORY PANEL:   CBC Recent Labs  Lab 05/13/19 0544  WBC 7.5  HGB 9.0*  HCT 29.5*  PLT 389   ------------------------------------------------------------------------------------------------------------------  Chemistries  Recent Labs  Lab 05/12/19 1428 05/12/19 1513  05/16/19 0450  NA 135  --    < > 132*  K 2.9*  --    < > 2.8*  CL 98  --    < > 98  CO2 19*  --    < > 24  GLUCOSE 116*  --    < > 223*  BUN 13  --    < > 8  CREATININE 1.26*  --    < > 0.99  CALCIUM 9.1  --    < > 8.2*  MG  --  1.7  --   --   AST 19  --   --   --   ALT 16  --   --   --   ALKPHOS 102  --   --   --   BILITOT 0.4  --   --   --    < > = values in this interval not displayed.   ------------------------------------------------------------------------------------------------------------------  Cardiac Enzymes Recent Labs  Lab 05/13/19 1502  TROPONINI <0.03   ------------------------------------------------------------------------------------------------------------------  RADIOLOGY:  Dg Abd 1 View  Result Date: 05/15/2019 CLINICAL DATA:  Nausea and vomiting EXAM: ABDOMEN - 1 VIEW COMPARISON:  CT abdomen 05/12/2019 FINDINGS: Normal bowel gas pattern.  Moderate stool in the right colon. Surgical clips in the liver from prior liver resection and cholecystectomy. Negative for urinary tract calculi. No acute  skeletal abnormality. IMPRESSION: Normal bowel gas pattern.  Moderate stool right colon. Electronically Signed   By: Franchot Gallo M.D.   On: 05/15/2019 13:29    EKG:   Orders placed or performed during the hospital encounter of 05/12/19  . ED EKG  . ED EKG  . EKG 12-Lead  . EKG 12-Lead  . EKG 12-Lead  . 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  1.  Acute on chronic gastroparesis-s currently better compared to yesterday.- Status post CT abdomen which did not show any acute findings on admission.  Continue supportive management at this time. -Advance to regular ADA diet, possible discharge tomorrow.  Patient states that he recently moved from [place] 2 weeks ago.   2.  Hypokalemia- replaced, patient potassium is still low at 2.8.,  Continue to replace.  3.  Recent admission for MRSA bacteremia on Apr 08, 2019 - patient was supposed to be on IV vancomycin until 05/24/2019. -  He had MRSA bacteremia with negative TEE but slight pericardial effusion unsure of pericarditis secondary to MRSA at the time as pericardiocentesis was not done. -Patient was recently changed to oral doxycycline as he was leaving the rehab on 05/07/2019.  Continue vancomycin, discharge tomorrow with doxycycline.  4.  Acute renal failure-resolved with IV fluids.  Continue to monitor  5.  Diabetes mellitus-recent A1c was 10.5.  As patient's nausea improved, has improved p.o. intake compared to yesterday will start the patient on Lantus, 6.  DVT prophylaxis-Lovenox   Patient is independent at baseline   All the records are reviewed and case discussed with Care Management/Social Workerr. Management  plans discussed with the patient, family and they are in agreement.  CODE STATUS: Full Code  TOTAL TIME TAKING CARE OF THIS PATIENT: 38 minutes.   POSSIBLE D/C IN 1-2 DAYS, DEPENDING ON CLINICAL CONDITION.   Epifanio Lesches M.D on 05/16/2019 at 3:17 PM  Between 7am to 6pm - Pager - [phone]  After 6pm go to [email or web address] - password [email or web address] [facility]  Office  [phone]  CC: Primary care physician; Patient, No Pcp Per

## 2019-05-17 LAB — BASIC METABOLIC PANEL
Anion gap: 8 (ref 5–15)
BUN: 8 mg/dL (ref 6–20)
CO2: 21 mmol/L — ABNORMAL LOW (ref 22–32)
Calcium: 7.8 mg/dL — ABNORMAL LOW (ref 8.9–10.3)
Chloride: 105 mmol/L (ref 98–111)
Creatinine, Ser: 0.86 mg/dL (ref 0.61–1.24)
GFR calc Af Amer: >60 mL/min (ref >60)
GFR calc non Af Amer: >60 mL/min (ref >60)
Glucose, Bld: 350 mg/dL — ABNORMAL HIGH (ref 70–99)
Potassium: 4 mmol/L (ref 3.5–5.1)
Sodium: 134 mmol/L — ABNORMAL LOW (ref 135–145)

## 2019-05-17 LAB — GLUCOSE, CAPILLARY
Glucose-Capillary: 253 mg/dL — ABNORMAL HIGH (ref 70–99)
Glucose-Capillary: 171 mg/dL — ABNORMAL HIGH (ref 70–99)

## 2019-05-17 MED ORDER — INSULIN ASPART 100 UNIT/ML ~~LOC~~ SOLN
5.0000 [IU] | Freq: Three times a day (TID) | SUBCUTANEOUS | Status: DC
  Administered 2019-05-17 (×2): 5 [IU] via SUBCUTANEOUS
  Filled 2019-05-17 (×2): qty 1

## 2019-05-17 MED ORDER — INSULIN GLARGINE 100 UNIT/ML ~~LOC~~ SOLN
15.0000 [IU] | Freq: Every day | SUBCUTANEOUS | Status: DC
  Administered 2019-05-17: 11:00:00 15 [IU] via SUBCUTANEOUS
  Filled 2019-05-17: qty 0.15

## 2019-05-17 MED ORDER — OXYCODONE HCL 5 MG PO TABS: 10.0000 mg | ORAL_TABLET | Freq: Three times a day (TID) | ORAL | 0 refills | Status: DC | PRN

## 2019-05-17 MED ORDER — INSULIN GLARGINE 100 UNIT/ML ~~LOC~~ SOLN: 15.0000 [IU] | Freq: Every day | SUBCUTANEOUS | 11 refills | Status: DC

## 2019-05-17 NOTE — Progress Notes (Signed)
Inpatient Diabetes Program Recommendations  AACE/ADA: New Consensus Statement on Inpatient Glycemic Control (2015)  Target Ranges:  Prepandial:   less than 140 mg/dL      Peak postprandial:   less than 180 mg/dL (1-2 hours)      Critically ill patients:  140 - 180 mg/dL  Results for GRANTLEY, SAVAGE (MRN [id number]) as of 05/17/2019 07:28  Ref. Range 05/17/2019 04:23  Glucose Latest Ref Range: 70 - 99 mg/dL 350 (H)   Results for IOAN, LANDINI (MRN [id number]) as of 05/17/2019 07:28  Ref. Range 05/16/2019 07:37 05/16/2019 11:46 05/16/2019 16:45 05/16/2019 19:58  Glucose-Capillary Latest Ref Range: 70 - 99 mg/dL 205 (H) 226 (H) 235 (H) 281 (H)    Review of Glycemic Control  Diabetes history: DM2 Outpatient Diabetes medications: Lantus 20 units BID, Novolog 5 units TID with meals Current orders for Inpatient glycemic control: Lantus 10 units daily, Novolog 0-15 units TID with meals  Inpatient Diabetes Program Recommendations:   Insulin - Basal: Please consider increasing Lantus 15 units daily.  Insulin-Meal Coverage: Please consider ordering Novolog 5 units TID with meals for meal coverage if patient eats at least 50% of meals.  Thanks, Barnie Alderman, RN, MSN, CDE Diabetes Coordinator Inpatient Diabetes Program [phone] (Team Pager from 8am to 5pm)

## 2019-05-17 NOTE — Care Management Important Message (Signed)
Important Message  Patient Details  Name: Kenneth Copeland MRN: [id number] Date of Birth: 09/21/1966   Medicare Important Message Given:  Yes     Juliann Pulse A Dael Howland 05/17/2019, 11:09 AM

## 2019-05-17 NOTE — TOC Transition Note (Signed)
Transition of Care [facility]) - CM/SW Discharge Note   Patient Details  Name: Kenneth Copeland MRN: [id number] Date of Birth: [birth year]  Transition of Care [facility]) CM/SW Contact:  Shelbie Hutching, RN Phone Number: 05/17/2019, 3:09 PM   Clinical Narrative:     Patient discharging home, no IV antibiotics needed patient transitioned to oral Doxycycline.  Cab voucher given and Ladona Mow providing transport.   Final next level of care: Home/Self Care Barriers to Discharge: Barriers Resolved   Patient Goals and CMS Choice        Discharge Placement                       Discharge Plan and Services   Discharge Planning Services: CM Consult                                 Social Determinants of Health (SDOH) Interventions     Readmission Risk Interventions Readmission Risk Prevention Plan 04/12/2019  Transportation Screening Complete  Home Care Screening Complete  Medication Review (RN CM) Complete

## 2019-05-18 LAB — CULTURE, BLOOD (ROUTINE X 2)
Culture: NO GROWTH
Culture: NO GROWTH
Special Requests: ADEQUATE

## 2019-05-20 ENCOUNTER — Other Ambulatory Visit: Payer: Self-pay

## 2019-05-20 ENCOUNTER — Encounter: Payer: Self-pay | Admitting: Emergency Medicine

## 2019-05-20 DIAGNOSIS — Z87891 Personal history of nicotine dependence: Secondary | ICD-10-CM | POA: Insufficient documentation

## 2019-05-20 DIAGNOSIS — Z1159 Encounter for screening for other viral diseases: Secondary | ICD-10-CM | POA: Insufficient documentation

## 2019-05-20 DIAGNOSIS — Z9221 Personal history of antineoplastic chemotherapy: Secondary | ICD-10-CM | POA: Diagnosis not present

## 2019-05-20 DIAGNOSIS — Z8505 Personal history of malignant neoplasm of liver: Secondary | ICD-10-CM | POA: Insufficient documentation

## 2019-05-20 DIAGNOSIS — G8929 Other chronic pain: Secondary | ICD-10-CM | POA: Insufficient documentation

## 2019-05-20 DIAGNOSIS — Z794 Long term (current) use of insulin: Secondary | ICD-10-CM | POA: Insufficient documentation

## 2019-05-20 DIAGNOSIS — I10 Essential (primary) hypertension: Secondary | ICD-10-CM | POA: Diagnosis not present

## 2019-05-20 DIAGNOSIS — Z79899 Other long term (current) drug therapy: Secondary | ICD-10-CM | POA: Diagnosis not present

## 2019-05-20 DIAGNOSIS — Z886 Allergy status to analgesic agent status: Secondary | ICD-10-CM | POA: Insufficient documentation

## 2019-05-20 DIAGNOSIS — Z888 Allergy status to other drugs, medicaments and biological substances status: Secondary | ICD-10-CM | POA: Diagnosis not present

## 2019-05-20 DIAGNOSIS — K3184 Gastroparesis: Secondary | ICD-10-CM | POA: Diagnosis present

## 2019-05-20 DIAGNOSIS — R1012 Left upper quadrant pain: Secondary | ICD-10-CM | POA: Diagnosis present

## 2019-05-20 DIAGNOSIS — R109 Unspecified abdominal pain: Secondary | ICD-10-CM | POA: Insufficient documentation

## 2019-05-20 DIAGNOSIS — R112 Nausea with vomiting, unspecified: Secondary | ICD-10-CM | POA: Diagnosis present

## 2019-05-20 DIAGNOSIS — E1143 Type 2 diabetes mellitus with diabetic autonomic (poly)neuropathy: Secondary | ICD-10-CM | POA: Diagnosis not present

## 2019-05-20 DIAGNOSIS — M1990 Unspecified osteoarthritis, unspecified site: Secondary | ICD-10-CM | POA: Diagnosis not present

## 2019-05-20 LAB — CBC
HCT: 32.4 % — ABNORMAL LOW (ref 39.0–52.0)
Hemoglobin: 10.2 g/dL — ABNORMAL LOW (ref 13.0–17.0)
MCH: 24.9 pg — ABNORMAL LOW (ref 26.0–34.0)
MCHC: 31.5 g/dL (ref 30.0–36.0)
MCV: 79.2 fL — ABNORMAL LOW (ref 80.0–100.0)
Platelets: 403 10*3/uL — ABNORMAL HIGH (ref 150–400)
RBC: 4.09 MIL/uL — ABNORMAL LOW (ref 4.22–5.81)
RDW: 16 % — ABNORMAL HIGH (ref 11.5–15.5)
WBC: 6.5 10*3/uL (ref 4.0–10.5)
nRBC: 0 % (ref 0.0–0.2)

## 2019-05-20 LAB — COMPREHENSIVE METABOLIC PANEL
ALT: 11 U/L (ref 0–44)
AST: 12 U/L — ABNORMAL LOW (ref 15–41)
Albumin: 4.2 g/dL (ref 3.5–5.0)
Alkaline Phosphatase: 88 U/L (ref 38–126)
Anion gap: 13 (ref 5–15)
BUN: 14 mg/dL (ref 6–20)
CO2: 26 mmol/L (ref 22–32)
Calcium: 8.9 mg/dL (ref 8.9–10.3)
Chloride: 94 mmol/L — ABNORMAL LOW (ref 98–111)
Creatinine, Ser: 0.85 mg/dL (ref 0.61–1.24)
GFR calc Af Amer: >60 mL/min (ref >60)
GFR calc non Af Amer: >60 mL/min (ref >60)
Glucose, Bld: 346 mg/dL — ABNORMAL HIGH (ref 70–99)
Potassium: 3.3 mmol/L — ABNORMAL LOW (ref 3.5–5.1)
Sodium: 133 mmol/L — ABNORMAL LOW (ref 135–145)
Total Bilirubin: 0.9 mg/dL (ref 0.3–1.2)
Total Protein: 8 g/dL (ref 6.5–8.1)

## 2019-05-20 LAB — LIPASE, BLOOD: Lipase: 26 U/L (ref 11–51)

## 2019-05-20 MED ORDER — ONDANSETRON HCL 4 MG/2ML IJ SOLN
4.0000 mg | Freq: Once | INTRAMUSCULAR | Status: AC | PRN
  Administered 2019-05-20: 4 mg via INTRAVENOUS
  Filled 2019-05-20: qty 2

## 2019-05-20 NOTE — ED Triage Notes (Signed)
Patient brought in by ems from home. Patient with complaint of left upper abdominal pain and vomiting that started on Friday. Patient was discharged from the hospital on Thursday for pancreatitis and gastroparesis.

## 2019-05-20 NOTE — ED Notes (Signed)
Pt resting in recliner in sub wait with eyes closed, even unlabored respirations

## 2019-05-21 ENCOUNTER — Other Ambulatory Visit: Payer: Self-pay

## 2019-05-21 ENCOUNTER — Observation Stay
Admission: EM | Admit: 2019-05-21 | Discharge: 2019-05-22 | Disposition: A | Discharge status: Home / Self Care | Payer: Medicare Other | Attending: Internal Medicine | Admitting: Internal Medicine

## 2019-05-21 DIAGNOSIS — R112 Nausea with vomiting, unspecified: Secondary | ICD-10-CM | POA: Diagnosis present

## 2019-05-21 DIAGNOSIS — E1143 Type 2 diabetes mellitus with diabetic autonomic (poly)neuropathy: Secondary | ICD-10-CM | POA: Diagnosis not present

## 2019-05-21 DIAGNOSIS — K3184 Gastroparesis: Secondary | ICD-10-CM

## 2019-05-21 DIAGNOSIS — I10 Essential (primary) hypertension: Secondary | ICD-10-CM

## 2019-05-21 DIAGNOSIS — R1012 Left upper quadrant pain: Secondary | ICD-10-CM

## 2019-05-21 LAB — SARS CORONAVIRUS 2 BY RT PCR (HOSPITAL ORDER, PERFORMED IN ~~LOC~~ HOSPITAL LAB): SARS Coronavirus 2: NEGATIVE

## 2019-05-21 LAB — GLUCOSE, CAPILLARY
Glucose-Capillary: 334 mg/dL — ABNORMAL HIGH (ref 70–99)
Glucose-Capillary: 282 mg/dL — ABNORMAL HIGH (ref 70–99)

## 2019-05-21 LAB — TSH: TSH: 1.772 u[IU]/mL (ref 0.350–4.500)

## 2019-05-21 MED ORDER — POLYVINYL ALCOHOL 1.4 % OP SOLN
2.0000 [drp] | Freq: Three times a day (TID) | OPHTHALMIC | Status: DC
  Administered 2019-05-22: 10:00:00 2 [drp] via OPHTHALMIC
  Filled 2019-05-21: qty 15

## 2019-05-21 MED ORDER — INSULIN ASPART 100 UNIT/ML ~~LOC~~ SOLN
0.0000 [IU] | Freq: Three times a day (TID) | SUBCUTANEOUS | Status: DC
  Administered 2019-05-21 – 2019-05-22 (×2): 9 [IU] via SUBCUTANEOUS
  Administered 2019-05-22: 12:00:00 7 [IU] via SUBCUTANEOUS
  Filled 2019-05-21 (×3): qty 1

## 2019-05-21 MED ORDER — METOCLOPRAMIDE HCL 5 MG/ML IJ SOLN: 5.0000 mg | Freq: Once | INTRAMUSCULAR | Status: DC

## 2019-05-21 MED ORDER — SODIUM CHLORIDE 0.9 % IV BOLUS
1000.0000 mL | Freq: Once | INTRAVENOUS | Status: AC
  Administered 2019-05-21: 1000 mL via INTRAVENOUS

## 2019-05-21 MED ORDER — TAMSULOSIN HCL 0.4 MG PO CAPS
0.4000 mg | ORAL_CAPSULE | Freq: Every day | ORAL | Status: DC
  Administered 2019-05-22: 0.4 mg via ORAL
  Filled 2019-05-21 (×2): qty 1

## 2019-05-21 MED ORDER — POTASSIUM CHLORIDE IN NACL 40-0.9 MEQ/L-% IV SOLN
INTRAVENOUS | Status: DC
  Administered 2019-05-21 – 2019-05-22 (×3): 125 mL/h via INTRAVENOUS
  Filled 2019-05-21 (×7): qty 1000

## 2019-05-21 MED ORDER — HYDRALAZINE HCL 20 MG/ML IJ SOLN
INTRAMUSCULAR | Status: AC
  Filled 2019-05-21: qty 1

## 2019-05-21 MED ORDER — METOCLOPRAMIDE HCL 10 MG PO TABS
10.0000 mg | ORAL_TABLET | Freq: Three times a day (TID) | ORAL | Status: DC
  Filled 2019-05-21: qty 1

## 2019-05-21 MED ORDER — DICYCLOMINE HCL 10 MG PO CAPS
10.0000 mg | ORAL_CAPSULE | Freq: Four times a day (QID) | ORAL | Status: DC
  Administered 2019-05-22: 10 mg via ORAL
  Filled 2019-05-21 (×8): qty 1

## 2019-05-21 MED ORDER — LISINOPRIL 20 MG PO TABS
40.0000 mg | ORAL_TABLET | Freq: Every day | ORAL | Status: DC
  Administered 2019-05-22: 40 mg via ORAL
  Filled 2019-05-21 (×2): qty 2

## 2019-05-21 MED ORDER — COLCHICINE 0.6 MG PO TABS
0.6000 mg | ORAL_TABLET | Freq: Two times a day (BID) | ORAL | Status: DC
  Administered 2019-05-22: 0.6 mg via ORAL
  Filled 2019-05-21 (×4): qty 1

## 2019-05-21 MED ORDER — INSULIN GLARGINE 100 UNIT/ML ~~LOC~~ SOLN
10.0000 [IU] | Freq: Every day | SUBCUTANEOUS | Status: DC
  Administered 2019-05-21: 22:00:00 10 [IU] via SUBCUTANEOUS
  Filled 2019-05-21 (×2): qty 0.1

## 2019-05-21 MED ORDER — PANTOPRAZOLE SODIUM 40 MG PO TBEC
40.0000 mg | DELAYED_RELEASE_TABLET | Freq: Two times a day (BID) | ORAL | Status: DC
  Administered 2019-05-22: 08:00:00 40 mg via ORAL
  Filled 2019-05-21: qty 1

## 2019-05-21 MED ORDER — ONDANSETRON HCL 4 MG/2ML IJ SOLN
4.0000 mg | Freq: Four times a day (QID) | INTRAMUSCULAR | Status: DC | PRN
  Administered 2019-05-21 (×2): 4 mg via INTRAVENOUS
  Filled 2019-05-21 (×3): qty 2

## 2019-05-21 MED ORDER — GABAPENTIN 300 MG PO CAPS
300.0000 mg | ORAL_CAPSULE | Freq: Three times a day (TID) | ORAL | Status: DC
  Administered 2019-05-22: 08:00:00 300 mg via ORAL
  Filled 2019-05-21 (×2): qty 1

## 2019-05-21 MED ORDER — FENTANYL CITRATE (PF) 100 MCG/2ML IJ SOLN
50.0000 ug | INTRAMUSCULAR | Status: AC | PRN
  Administered 2019-05-21 (×2): 50 ug via INTRAVENOUS
  Filled 2019-05-21 (×2): qty 2

## 2019-05-21 MED ORDER — DOXYCYCLINE HYCLATE 100 MG PO TABS
100.0000 mg | ORAL_TABLET | Freq: Two times a day (BID) | ORAL | Status: DC
  Administered 2019-05-22: 100 mg via ORAL
  Filled 2019-05-21 (×2): qty 1

## 2019-05-21 MED ORDER — ENOXAPARIN SODIUM 40 MG/0.4ML ~~LOC~~ SOLN
40.0000 mg | SUBCUTANEOUS | Status: DC
  Administered 2019-05-22: 40 mg via SUBCUTANEOUS
  Filled 2019-05-21: qty 0.4

## 2019-05-21 MED ORDER — MORPHINE SULFATE (PF) 2 MG/ML IV SOLN
2.0000 mg | INTRAVENOUS | Status: DC | PRN
  Administered 2019-05-21 – 2019-05-22 (×5): 2 mg via INTRAVENOUS
  Filled 2019-05-21 (×5): qty 1

## 2019-05-21 MED ORDER — OXYCODONE HCL 5 MG PO TABS
10.0000 mg | ORAL_TABLET | Freq: Three times a day (TID) | ORAL | Status: DC | PRN
  Administered 2019-05-22: 10 mg via ORAL
  Filled 2019-05-21 (×2): qty 2

## 2019-05-21 MED ORDER — HYDROMORPHONE HCL 1 MG/ML IJ SOLN
1.0000 mg | Freq: Once | INTRAMUSCULAR | Status: AC
  Administered 2019-05-21: 1 mg via INTRAVENOUS
  Filled 2019-05-21: qty 1

## 2019-05-21 MED ORDER — METOCLOPRAMIDE HCL 5 MG/ML IJ SOLN
5.0000 mg | Freq: Once | INTRAMUSCULAR | Status: AC
  Administered 2019-05-21: 5 mg via INTRAVENOUS
  Filled 2019-05-21: qty 2

## 2019-05-21 MED ORDER — METOCLOPRAMIDE HCL 5 MG/ML IJ SOLN
5.0000 mg | Freq: Four times a day (QID) | INTRAMUSCULAR | Status: DC
  Administered 2019-05-21 – 2019-05-22 (×5): 5 mg via INTRAVENOUS
  Filled 2019-05-21 (×4): qty 2

## 2019-05-21 MED ORDER — CALCIUM CARBONATE ANTACID 500 MG PO CHEW: 1.0000 | CHEWABLE_TABLET | Freq: Three times a day (TID) | ORAL | Status: DC | PRN

## 2019-05-21 MED ORDER — DOCUSATE SODIUM 100 MG PO CAPS: 100.0000 mg | ORAL_CAPSULE | Freq: Every day | ORAL | Status: DC | PRN

## 2019-05-21 MED ORDER — HYDRALAZINE HCL 20 MG/ML IJ SOLN
10.0000 mg | Freq: Once | INTRAMUSCULAR | Status: AC
  Administered 2019-05-21: 10 mg via INTRAVENOUS

## 2019-05-21 MED ORDER — ONDANSETRON HCL 4 MG PO TABS: 4.0000 mg | ORAL_TABLET | Freq: Four times a day (QID) | ORAL | Status: DC | PRN

## 2019-05-21 MED ORDER — PROCHLORPERAZINE EDISYLATE 10 MG/2ML IJ SOLN
10.0000 mg | Freq: Four times a day (QID) | INTRAMUSCULAR | Status: DC | PRN
  Administered 2019-05-21: 11:00:00 10 mg via INTRAVENOUS
  Filled 2019-05-21 (×2): qty 2

## 2019-05-21 MED ORDER — HYDRALAZINE HCL 20 MG/ML IJ SOLN: 10.0000 mg | Freq: Four times a day (QID) | INTRAMUSCULAR | Status: DC | PRN

## 2019-05-21 MED ORDER — SUCRALFATE 1 G PO TABS
1.0000 g | ORAL_TABLET | Freq: Four times a day (QID) | ORAL | Status: DC
  Administered 2019-05-22: 08:00:00 1 g via ORAL
  Filled 2019-05-21 (×2): qty 1

## 2019-05-21 MED ORDER — DULOXETINE HCL 30 MG PO CPEP
30.0000 mg | ORAL_CAPSULE | Freq: Every day | ORAL | Status: DC
  Administered 2019-05-22: 08:00:00 30 mg via ORAL
  Filled 2019-05-21 (×2): qty 1

## 2019-05-21 MED ORDER — ADULT MULTIVITAMIN W/MINERALS CH
1.0000 | ORAL_TABLET | Freq: Every day | ORAL | Status: DC
  Administered 2019-05-22: 1 via ORAL
  Filled 2019-05-21 (×2): qty 1

## 2019-05-21 NOTE — ED Notes (Signed)
Dr. Beather Arbour notified of htn, order for hydralazine received.

## 2019-05-21 NOTE — ED Notes (Signed)
Pt states has high blood pressure but he does not take medication for same. Pt states he takes oral antidiabetics and insulin but his blood sugar is poorly controlled.

## 2019-05-21 NOTE — H&P (Signed)
Kenneth Copeland is an 53 y.o. male.   Chief Complaint: Abdominal pain HPI:  the patient with past medical history of liver cancer, hypertension diabetes presents to the emergency department complaining of abdominal pain.  The patient was discharged from the hospital 2 days ago for the same.  He states that he has not had much improvement since being home.  Laboratory evaluation revealed mild hypokalemia as well as elevated serum glucose and hyponatremia.  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  Thematic analgesia without relief which prompted the emergency department staff to call the hospitalist service for admission.  Past Medical History:  Diagnosis Date  . Arthritis   . Cancer ([place])   . Diabetes mellitus without complication ([place])   . Hypertension   . Liver cancer [facility])     Past Surgical History:  Procedure Laterality Date  . HERNIA REPAIR    . LIVER LOBECTOMY    . TEE WITHOUT CARDIOVERSION N/A 04/11/2019   Procedure: TRANSESOPHAGEAL ECHOCARDIOGRAM (TEE);  Surgeon: Corey Skains, MD;  Location: [facility] ORS;  Service: Cardiovascular;  Laterality: N/A;    Family History  Family history unknown: Yes   Social History:  reports that he has quit smoking. He has never used smokeless tobacco. He reports previous alcohol use. He reports current drug use. Frequency: 0.50 times per week. Drugs: Marijuana and Cocaine.  Allergies:  Allergies  Allergen Reactions  . Metformin And Related     Pt. States it makes his stomach bleed because of a stomach ulcer  . Motrin [Ibuprofen]     Pt. States it "inflames his stomach, causes bleeding in stomach"    (Not in a hospital admission)   Results for orders placed or performed during the hospital encounter of 05/21/19 (from the past 48 hour(s))  Lipase, blood     Status: None   Collection Time: 05/20/19   7:51 PM  Result Value Ref Range   Lipase 26 11 - 51 U/L    Comment: Performed at [facility], [address] [place], [place] [address]  Comprehensive metabolic panel     Status: Abnormal   Collection Time: 05/20/19  7:51 PM  Result Value Ref Range   Sodium 133 (L) 135 - 145 mmol/L   Potassium 3.3 (L) 3.5 - 5.1 mmol/L   Chloride 94 (L) 98 - 111 mmol/L   CO2 26 22 - 32 mmol/L   Glucose, Bld 346 (H) 70 - 99 mg/dL   BUN 14 6 - 20 mg/dL   Creatinine, Ser 0.85 0.61 - 1.24 mg/dL   Calcium 8.9 8.9 - 10.3 mg/dL   Total Protein 8.0 6.5 - 8.1 g/dL   Albumin 4.2 3.5 - 5.0 g/dL   AST 12 (L) 15 - 41 U/L   ALT 11 0 - 44 U/L   Alkaline Phosphatase 88 38 - 126 U/L   Total Bilirubin 0.9 0.3 - 1.2 mg/dL   GFR calc non Af Amer >60 >60 mL/min   GFR calc Af Amer >60 >60 mL/min   Anion gap 13 5 - 15    Comment: Performed at [facility], [address] [place], [place] [address]  CBC     Status: Abnormal   Collection Time: 05/20/19  7:51 PM  Result Value Ref Range   WBC 6.5 4.0 - 10.5 K/uL   RBC 4.09 (L) 4.22 - 5.81 MIL/uL  Hemoglobin 10.2 (L) 13.0 - 17.0 g/dL   HCT 32.4 (L) 39.0 - 52.0 %   MCV 79.2 (L) 80.0 - 100.0 fL   MCH 24.9 (L) 26.0 - 34.0 pg   MCHC 31.5 30.0 - 36.0 g/dL   RDW 16.0 (H) 11.5 - 15.5 %   Platelets 403 (H) 150 - 400 K/uL   nRBC 0.0 0.0 - 0.2 %    Comment: Performed at [facility], [address] [place], [place] [address]   No results found.  Review of Systems  Constitutional: Negative for chills and fever.  HENT: Negative for sore throat and tinnitus.   Eyes: Negative for blurred vision and redness.  Respiratory: Negative for cough and shortness of breath.   Cardiovascular: Negative for chest pain, palpitations, orthopnea and PND.  Gastrointestinal: Positive for abdominal pain, nausea and vomiting. Negative for diarrhea.  Genitourinary: Negative for dysuria, frequency and urgency.  Musculoskeletal: Negative for joint pain and  myalgias.  Skin: Negative for rash.       No lesions  Neurological: Negative for speech change, focal weakness and weakness.  Endo/Heme/Allergies: Does not bruise/bleed easily.       No temperature intolerance  Psychiatric/Behavioral: Negative for depression and suicidal ideas.    Blood pressure (!) 142/98, pulse 99, temperature 98.8 F (37.1 C), temperature source Oral, resp. rate 18, height 5\' 11"  (1.803 m), weight 81.6 kg, SpO2 97 %. Physical Exam  Vitals reviewed. Constitutional: He is oriented to person, place, and time. He appears well-developed and well-nourished. No distress.  HENT:  Head: Normocephalic and atraumatic.  Mouth/Throat: Oropharynx is clear and moist.  Eyes: Pupils are equal, round, and reactive to light. Conjunctivae and EOM are normal. No scleral icterus.  Neck: Normal range of motion. Neck supple. No JVD present. No tracheal deviation present. No thyromegaly present.  Cardiovascular: Normal rate, regular rhythm, normal heart sounds and intact distal pulses. Exam reveals no gallop and no friction rub.  No murmur heard. Respiratory: Effort normal and breath sounds normal. No respiratory distress.  GI: Soft. Bowel sounds are normal. He exhibits no distension. There is no abdominal tenderness.  Genitourinary:    Genitourinary Comments: Deferred   Musculoskeletal: Normal range of motion.        General: No edema.  Lymphadenopathy:    He has no cervical adenopathy.  Neurological: He is alert and oriented to person, place, and time. No cranial nerve deficit.  Skin: Skin is warm and dry. No rash noted. No erythema.  Psychiatric: He has a normal mood and affect. His behavior is normal. Judgment and thought content normal.     Assessment/Plan This is a 53 year old male admitted for intractable nausea and vomiting 1.  Nausea and vomiting: Intractable; hydrate with intravenous fluid.  Compazine as needed for refractory nausea and vomiting.  IV morphine as needed for  severe pain. 2.  Diabetes mellitus type 2: Continue basal insulin adjusted for hospital by an appetite.  Sliding scale insulin while hospitalized as well. 3.  Hypertension: Uncontrolled; exacerbated by recurrent vomiting/heaves.  Labetalol as needed. 4.  Liver cancer: The patient reportedly received 2 rounds of chemotherapy in [place].  He has been in remission for little more than a year.  Continue surveillance for any signs or symptoms of metastasis. 5.  DVT prophylaxis: Lovenox 6.  GI prophylaxis: None The patient is a full code.  Time spent on admission orders and patient care approximately 45 minutes  Harrie Foreman, MD 05/21/2019, 7:13 AM

## 2019-05-21 NOTE — ED Notes (Signed)
ED TO INPATIENT HANDOFF REPORT  ED Nurse Name and Phone #: Gwenette Greet [place] Name/Age/Gender Kenneth Copeland 53 y.o. male Room/Bed: ED06A/ED06A  Code Status   Code Status: Prior  Home/SNF/Other Home Patient oriented to: self, place, time and situation Is this baseline? Yes   Triage Complete: Triage complete  Chief Complaint EMS - abdominal pain  Triage Note Patient brought in by ems from home. Patient with complaint of left upper abdominal pain and vomiting that started on Friday. Patient was discharged from the hospital on Thursday for pancreatitis and gastroparesis.     Allergies Allergies  Allergen Reactions  . Metformin And Related     Pt. States it makes his stomach bleed because of a stomach ulcer  . Motrin [Ibuprofen]     Pt. States it "inflames his stomach, causes bleeding in stomach"    Level of Care/Admitting Diagnosis ED Disposition    ED Disposition Condition [place]: [place] [[id number]]  Level of Care: Med-Surg [16]  Covid Evaluation: Confirmed COVID Negative  Diagnosis: Intractable nausea and vomiting [945038]  Admitting Physician: Harrie Foreman [[id number]]  Attending Physician: Harrie Foreman [phone]  PT Class (Do Not Modify): Observation [104]  PT Acc Code (Do Not Modify): Observation [10022]       B Medical/Surgery History Past Medical History:  Diagnosis Date  . Arthritis   . Cancer ([place])   . Diabetes mellitus without complication ([place])   . Hypertension   . Liver cancer [facility])    Past Surgical History:  Procedure Laterality Date  . HERNIA REPAIR    . LIVER LOBECTOMY    . TEE WITHOUT CARDIOVERSION N/A 04/11/2019   Procedure: TRANSESOPHAGEAL ECHOCARDIOGRAM (TEE);  Surgeon: Corey Skains, MD;  Location: [facility] ORS;  Service: Cardiovascular;  Laterality: N/A;     A IV Location/Drains/Wounds Patient Lines/Drains/Airways Status   Active Line/Drains/Airways    Name:   Placement date:    Placement time:   Site:   Days:   Peripheral IV 05/20/19 Left Wrist   05/20/19    1930    Wrist   1          Intake/Output Last 24 hours  Intake/Output Summary (Last 24 hours) at 05/21/2019 0739 Last data filed at 05/21/2019 0569 Gross per 24 hour  Intake 1000 ml  Output -  Net 1000 ml    Labs/Imaging Results for orders placed or performed during the hospital encounter of 05/21/19 (from the past 48 hour(s))  Lipase, blood     Status: None   Collection Time: 05/20/19  7:51 PM  Result Value Ref Range   Lipase 26 11 - 51 U/L    Comment: Performed at [facility], [address] [place], [place] [address]  Comprehensive metabolic panel     Status: Abnormal   Collection Time: 05/20/19  7:51 PM  Result Value Ref Range   Sodium 133 (L) 135 - 145 mmol/L   Potassium 3.3 (L) 3.5 - 5.1 mmol/L   Chloride 94 (L) 98 - 111 mmol/L   CO2 26 22 - 32 mmol/L   Glucose, Bld 346 (H) 70 - 99 mg/dL   BUN 14 6 - 20 mg/dL   Creatinine, Ser 0.85 0.61 - 1.24 mg/dL   Calcium 8.9 8.9 - 10.3 mg/dL   Total Protein 8.0 6.5 - 8.1 g/dL   Albumin 4.2 3.5 - 5.0 g/dL   AST 12 (L) 15 - 41 U/L   ALT 11  0 - 44 U/L   Alkaline Phosphatase 88 38 - 126 U/L   Total Bilirubin 0.9 0.3 - 1.2 mg/dL   GFR calc non Af Amer >60 >60 mL/min   GFR calc Af Amer >60 >60 mL/min   Anion gap 13 5 - 15    Comment: Performed at [facility], [address] [place], [place] [address]  CBC     Status: Abnormal   Collection Time: 05/20/19  7:51 PM  Result Value Ref Range   WBC 6.5 4.0 - 10.5 K/uL   RBC 4.09 (L) 4.22 - 5.81 MIL/uL   Hemoglobin 10.2 (L) 13.0 - 17.0 g/dL   HCT 32.4 (L) 39.0 - 52.0 %   MCV 79.2 (L) 80.0 - 100.0 fL   MCH 24.9 (L) 26.0 - 34.0 pg   MCHC 31.5 30.0 - 36.0 g/dL   RDW 16.0 (H) 11.5 - 15.5 %   Platelets 403 (H) 150 - 400 K/uL   nRBC 0.0 0.0 - 0.2 %    Comment: Performed at [facility], [address] [place], [place] [address]   No results found.  Pending  Labs [employer] (From admission, onward)    Start     Ordered   05/21/19 0718  SARS Coronavirus 2 (CEPHEID - Performed in [place] hospital lab), [facility] Order  Once,   STAT    Question:  Rule Out  Answer:  Yes   05/21/19 0717   05/20/19 1931  Urinalysis, Complete w Microscopic  ONCE - STAT,   STAT     05/20/19 1931   Signed and Held  Creatinine, serum  (enoxaparin (LOVENOX)    CrCl >/= 30 ml/min)  Weekly,   R    Comments: while on enoxaparin therapy    Signed and Held   Signed and Held  TSH  Add-on,   R     Signed and Held          Vitals/Pain Today's Vitals   05/21/19 0530 05/21/19 0545 05/21/19 0630 05/21/19 0737  BP: (!) 135/100  (!) 142/98   Pulse: 93 94 99   Resp:      Temp:      TempSrc:      SpO2: 97% 98% 97%   Weight:      Height:      PainSc:    8     Isolation Precautions No active isolations  Medications Medications  metoCLOPramide (REGLAN) injection 5 mg (has no administration in time range)  ondansetron (ZOFRAN) tablet 4 mg ( Oral See Alternative 05/21/19 0731)    Or  ondansetron (ZOFRAN) injection 4 mg (4 mg Intravenous Given 05/21/19 0731)  ondansetron (ZOFRAN) injection 4 mg (4 mg Intravenous Given 05/20/19 1950)  fentaNYL (SUBLIMAZE) injection 50 mcg (50 mcg Intravenous Given 05/21/19 0725)  sodium chloride 0.9 % bolus 1,000 mL (0 mLs Intravenous Stopped 05/21/19 0722)  HYDROmorphone (DILAUDID) injection 1 mg (1 mg Intravenous Given 05/21/19 0506)  metoCLOPramide (REGLAN) injection 5 mg (5 mg Intravenous Given 05/21/19 0507)  hydrALAZINE (APRESOLINE) injection 10 mg (10 mg Intravenous Given 05/21/19 0522)    Mobility walks     Focused Assessments see assessments   R Recommendations: See Admitting Provider Note  Report given to:   Additional Notes:

## 2019-05-21 NOTE — ED Notes (Addendum)
Attempted to call report to 1 C. Informed that bed not ready

## 2019-05-21 NOTE — ED Notes (Signed)
Report to [place] and kim, rn.

## 2019-05-21 NOTE — Discharge Summary (Signed)
Kenneth Copeland, is a 53 y.o. male  DOB [birth year]  MRN [id number].  Admission date:  05/12/2019  Admitting Physician  Christel Mormon, MD  Discharge Date:  05/17/2019   Primary MD  Patient, No Pcp Per  Recommendations for primary care physician for things to follow:   Pt Moved  To [place] recently ,in the process of getting PCP.Patient reports that he has a PCP appointment with a [facility] MD for July 20th  Admission Diagnosis  Nausea vomiting and diarrhea [R11.2, R19.7] Pain of upper abdomen [R10.10]   Discharge Diagnosis  Nausea vomiting and diarrhea [R11.2, R19.7] Pain of upper abdomen [R10.10]   Active Problems:   Gastroparesis due to DM [facility])      Past Medical History:  Diagnosis Date  . Arthritis   . Cancer ([place])   . Diabetes mellitus without complication ([place])   . Hypertension   . Liver cancer [facility])     Past Surgical History:  Procedure Laterality Date  . HERNIA REPAIR    . LIVER LOBECTOMY    . 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 and status post chemotherapy, poorly controlled diabetes mellitus, history of gastroparesis, prior history of IVDA presents from home secondary to worsening abdominal pain and nausea vomiting   Hospital Course  Intractable nausea  and vomiting;improved with iv fluids,IV reglan,Phenergan.CT abdomen on admission did not show any acute abnormality.nausea and vomiting improved  With conservative treatment and pt started to tolerate diet.pt is eager to go home and we discharged him in stable  Condition.Marland Kitchen  2.DMII;,hba1c is 10.5,seen   By diabetes coordinator,discharged home with lantus 15 units daily and novolog 5 units TID with meals. 3,Acute kidney injury;due  To ATN,prerenal due to poor po intake with nausea and vomiting;resolved with iv fluids. 4.Recent Admission for MRSA bacteremia,received IV vanco on this stay due to his nausea and vomiting and unable to take oral meds,supposed to be  On doxy till July 2nd.as pt nausea and vomiting improved,,discharged home advised pt to take doxycycline till July 2nd. 5,hypokalemia due to poor po intake;improved with supplements 6.h/o cholangiocarcinoma s/p  Partial hepatectomy ,chemotherapy,requested oxycodone ,gave limited supply of them.pt received his cancer treatment from [facility]. 7.h/o NSVT:during last discharge,started on amiodarone and pt said that he is taking and follows up with DR.Callwood.so advised him to continue. Discharge Condition: stable   Follow UP   follow up with PCP on July 20 th as planned F/u DR.Callwood   Discharge Instructions  and  Discharge Medications     Allergies as of 05/17/2019      Reactions   Metformin And Related    Pt. States it makes his stomach bleed because of a stomach ulcer   Motrin [ibuprofen]    Pt. States it "inflames his stomach, causes bleeding in stomach"      Medication List    STOP taking these medications   vancomycin  IVPB     TAKE these medications  acetaminophen 325 MG tablet Commonly known as: TYLENOL Take 650 mg by mouth every 6 (six) hours as needed for mild pain or fever.   amiodarone 100 MG tablet Commonly known as: PACERONE Take 1 tablet (100 mg total) by mouth 2 (two) times daily.   calcium  carbonate 500 MG chewable tablet Commonly known as: TUMS - dosed in mg elemental calcium Chew 1 tablet (200 mg of elemental calcium total) by mouth 3 (three) times daily as needed for indigestion or heartburn.   carboxymethylcellulose 0.5 % Soln Commonly known as: REFRESH PLUS Place 2 drops into the left eye 3 (three) times daily.   colchicine 0.6 MG tablet Take 1 tablet (0.6 mg total) by mouth 2 (two) times daily.   dicyclomine 10 MG capsule Commonly known as: BENTYL Take 10 mg by mouth 4 (four) times daily.   docusate sodium 100 MG capsule Commonly known as: COLACE Take 1 capsule (100 mg total) by mouth daily as needed for mild constipation.   doxycycline 100 MG tablet Commonly known as: VIBRA-TABS Take 100 mg by mouth 2 (two) times a day. For 17 days   DULoxetine 30 MG capsule Commonly known as: Cymbalta Take 1 capsule (30 mg total) by mouth daily.   gabapentin 300 MG capsule Commonly known as: NEURONTIN Take 1 capsule (300 mg total) by mouth 3 (three) times daily.   insulin aspart 100 UNIT/ML injection Commonly known as: novoLOG Inject 5 Units into the skin 3 (three) times daily with meals.   insulin glargine 100 UNIT/ML injection Commonly known as: LANTUS Inject 0.15 mLs (15 Units total) into the skin daily. What changed:   how much to take  when to take this   ipratropium-albuterol 0.5-2.5 (3) MG/3ML Soln Commonly known as: DUONEB Take 3 mLs by nebulization every 6 (six) hours as needed.   metoCLOPramide 10 MG tablet Commonly known as: REGLAN Take 10 mg by mouth 4 (four) times daily -  before meals and at bedtime.   multivitamin with minerals Tabs tablet Take 1 tablet by mouth daily.   ondansetron 4 MG tablet Commonly known as: ZOFRAN Take 1 tablet (4 mg total) by mouth every 6 (six) hours as needed for nausea.   oxyCODONE 5 MG immediate release tablet Commonly known as: Roxicodone Take 2 tablets (10 mg total) by mouth every 8 (eight) hours as  needed. What changed:   medication strength  how much to take  when to take this  reasons to take this  Another medication with the same name was removed. Continue taking this medication, and follow the directions you see here.   pantoprazole 40 MG tablet Commonly known as: PROTONIX Take 40 mg by mouth 2 (two) times daily.   sucralfate 1 g tablet Commonly known as: CARAFATE Take 1 g by mouth 4 (four) times daily.   tamsulosin 0.4 MG Caps capsule Commonly known as: FLOMAX Take 1 capsule (0.4 mg total) by mouth daily.         Diet and Activity recommendation: See Discharge Instructions above   Consults obtained -none   Major procedures and Radiology Reports - PLEASE review detailed and final reports for all details, in brief -     Dg Abd 1 View  Result Date: 05/15/2019 CLINICAL DATA:  Nausea and vomiting EXAM: ABDOMEN - 1 VIEW COMPARISON:  CT abdomen 05/12/2019 FINDINGS: Normal bowel gas pattern.  Moderate stool in the right colon. Surgical clips in the liver from prior liver resection and cholecystectomy. Negative for urinary tract  calculi. No acute skeletal abnormality. IMPRESSION: Normal bowel gas pattern.  Moderate stool right colon. Electronically Signed   By: Franchot Gallo M.D.   On: 05/15/2019 13:29   Ct Abdomen Pelvis W Contrast  Result Date: 05/12/2019 CLINICAL DATA:  Abdominal pain and vomiting. History of hepatic lesion resection. EXAM: CT ABDOMEN AND PELVIS WITH CONTRAST TECHNIQUE: Multidetector CT imaging of the abdomen and pelvis was performed using the standard protocol following bolus administration of intravenous contrast. CONTRAST:  14mL OMNIPAQUE IOHEXOL 300 MG/ML  SOLN COMPARISON:  CT scan 04/13/2019 FINDINGS: Lower chest: Small right pleural effusion with overlying atelectasis. The left lung is clear. There is a persistent small pericardial effusion. Hepatobiliary: Postoperative changes involving the liver with previous liver lesion resection. The  gallbladder is also surgically absent. I do not see any worrisome hepatic lesions or intrahepatic biliary dilatation. Mild stable common bile duct dilatation. Pancreas: Persistent ill-defined low-attenuation in the pancreatic tail which could be chronic fibrosis. Infiltrating tumor is also possible but felt to be less likely. Recommend correlation with any prior imaging studies that might show this to be a stable process. 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 The portal vein is patent. Spleen: Spleen is upper limits of normal in size. No splenic lesions. Adrenals/Urinary Tract: The adrenal glands and kidneys are unremarkable. No acute obstructive findings or worrisome renal lesions. No collecting system abnormalities. No ureteral or bladder calculi. No bladder mass. Mild bladder distention. Stomach/Bowel: The stomach, duodenum, small bowel and colon are grossly normal without oral contrast. No inflammatory changes, mass lesions or obstructive findings. The terminal ileum and appendix are normal. Vascular/Lymphatic: Stable atherosclerotic calcifications involving the aorta distally. No aneurysm or dissection. The branch vessels are patent. The major venous structures are patent. Small scattered mesenteric and retroperitoneal lymph nodes but no mass or overt adenopathy. Reproductive: The prostate gland and seminal vesicles are unremarkable. Other: No pelvic mass or free pelvic fluid collections. No inguinal mass or adenopathy. Musculoskeletal: No significant bony findings. IMPRESSION: 1. Persistent area of low attenuation in the pancreatic tail, possibly chronic fibrosis. Infiltrating tumor felt to be less likely. Recommend correlation with any prior imaging studies which might help demonstrate this to be a chronic process. 2. 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 3. Remote  postoperative changes involving the liver from a prior hepatic lesion resection. Status post cholecystectomy also. 4. Right pleural effusion with overlying atelectasis. Electronically Signed   By: Marijo Sanes M.D.   On: 05/12/2019 16:37    Micro Results     Recent Results (from the past 240 hour(s))  Novel Coronavirus,NAA,(SEND-OUT TO REF LAB - TAT 24-48 hrs); Hosp Order     Status: None   Collection Time: 05/12/19  7:52 PM   Specimen: Nasopharyngeal Swab; Respiratory  Result Value Ref Range Status   SARS-CoV-2, NAA NOT DETECTED NOT DETECTED Final    Comment: (NOTE) Testing was performed using the cobas(R) SARS-CoV-2 test. This test was developed and its performance characteristics determined by [employer]. This test has not been FDA cleared or approved. This test has been authorized by FDA under an Emergency Use Authorization (EUA). This test is only authorized for the duration of time the declaration that circumstances exist justifying the authorization of the emergency use of in vitro diagnostic tests for detection of SARS-CoV-2 virus and/or diagnosis of COVID-19 infection under section 564(b)(1) of the Act, 21 U.S.C. 417EYC-1(K)(4), unless the authorization is  terminated or revoked sooner. When diagnostic testing is negative, the possibility of a false negative result should be considered in the context of a patient's recent exposures and the presence of clinical signs and symptoms consistent with COVID-19. An individual without symptoms of COVID-19 and who is not shedding SARS-CoV-2 virus would expect to have  a negative (not detected) result in this assay. Performed At: [facility] [address] [place], [place] [id number] Rush Farmer MD DJ:[id number]    [place]  Final    Comment: Performed at [facility], [address] [place], [place] [address]  CULTURE, BLOOD (ROUTINE X 2) w Reflex to ID Panel     Status:  None   Collection Time: 05/13/19  2:51 PM   Specimen: BLOOD  Result Value Ref Range Status   Specimen Description BLOOD LEFT HAND  Final   Special Requests BOTTLES DRAWN AEROBIC AND ANAEROBIC LEFT HAND  Final   Culture   Final    NO GROWTH 5 DAYS Performed at [facility], [address] [place], [place] [address]    Report Status 05/18/2019 FINAL  Final  CULTURE, BLOOD (ROUTINE X 2) w Reflex to ID Panel     Status: None   Collection Time: 05/13/19  2:51 PM   Specimen: BLOOD  Result Value Ref Range Status   Specimen Description BLOOD LEFT ARM  Final   Special Requests   Final    BOTTLES DRAWN AEROBIC AND ANAEROBIC Blood Culture adequate volume   Culture   Final    NO GROWTH 5 DAYS Performed at [facility], [address] [place], [place] [address]    Report Status 05/18/2019 FINAL  Final  MRSA PCR Screening     Status: None   Collection Time: 05/15/19  4:07 PM   Specimen: Nasopharyngeal  Result Value Ref Range Status   MRSA by PCR NEGATIVE NEGATIVE Final    Comment:        The GeneXpert MRSA Assay (FDA approved for NASAL specimens only), is one component of a comprehensive MRSA colonization surveillance program. It is not intended to diagnose MRSA infection nor to guide or monitor treatment for MRSA infections. Performed at [facility], [address] [place], [place] [address]   SARS Coronavirus 2 (CEPHEID - Performed in [facility] hospital lab), Hosp Order     Status: None   Collection Time: 05/21/19  7:35 AM   Specimen: Nasopharyngeal Swab  Result Value Ref Range Status   SARS Coronavirus 2 NEGATIVE NEGATIVE Final    Comment: (NOTE) If result is NEGATIVE SARS-CoV-2 target nucleic acids are NOT DETECTED. The SARS-CoV-2 RNA is generally detectable in upper and lower  respiratory specimens during the acute phase of infection. The lowest  concentration of SARS-CoV-2 viral copies this assay can detect is 250  copies / mL. A negative  result does not preclude SARS-CoV-2 infection  and should not be used as the sole basis for treatment or other  patient management decisions.  A negative result may occur with  improper specimen collection / handling, submission of specimen other  than nasopharyngeal swab, presence of viral mutation(s) within the  areas targeted by this assay, and inadequate number of viral copies  (<250 copies / mL). A negative result must be combined with clinical  observations, patient history, and epidemiological information. If result is POSITIVE SARS-CoV-2 target nucleic acids are DETECTED. The SARS-CoV-2 RNA is generally detectable in upper and lower  respiratory specimens dur ing  the acute phase of infection.  Positive  results are indicative of active infection with SARS-CoV-2.  Clinical  correlation with patient history and other diagnostic information is  necessary to determine patient infection status.  Positive results do  not rule out bacterial infection or co-infection with other viruses. If result is PRESUMPTIVE POSTIVE SARS-CoV-2 nucleic acids MAY BE PRESENT.   A presumptive positive result was obtained on the submitted specimen  and confirmed on repeat testing.  While 2019 novel coronavirus  (SARS-CoV-2) nucleic acids may be present in the submitted sample  additional confirmatory testing may be necessary for epidemiological  and / or clinical management purposes  to differentiate between  SARS-CoV-2 and other Sarbecovirus currently known to infect humans.  If clinically indicated additional testing with an alternate test  methodology [id number]) is advised. The SARS-CoV-2 RNA is generally  detectable in upper and lower respiratory sp ecimens during the acute  phase of infection. The expected result is Negative. Fact Sheet for Patients:  StrictlyIdeas.no Fact Sheet for Healthcare Providers: BankingDealers.co.za This test is not yet  approved or cleared by the [place] FDA and has been authorized for detection and/or diagnosis of SARS-CoV-2 by FDA under an Emergency Use Authorization (EUA).  This EUA will remain in effect (meaning this test can be used) for the duration of the COVID-19 declaration under Section 564(b)(1) of the Act, 21 U.S.C. section 360bbb-3(b)(1), unless the authorization is terminated or revoked sooner. Performed at [facility], [address], [place], [place] [address]        Today   Subjective:   Kenneth Copeland today has no nausea,vomitng and abdominal pain.Tolerating diet and eager to go home.  Objective:   Blood pressure (!) 152/107, pulse 93, temperature 98.6 F (37 C), temperature source Oral, resp. rate 15, weight 81.6 kg, SpO2 97 %.  No intake or output data in the 24 hours ending 05/21/19 0856  Exam Awake Alert, Oriented x 3, No new F.N deficits, Normal affect [place].AT,PERRAL Supple Neck,No JVD, No cervical lymphadenopathy appriciated.  Symmetrical Chest wall movement, Good air movement bilaterally, CTAB RRR,No Gallops,Rubs or new Murmurs, No Parasternal Heave +ve B.Sounds, Abd Soft, Non tender, No organomegaly appriciated, No rebound -guarding or rigidity. No Cyanosis, Clubbing or edema, No new Rash or bruise  Data Review   CBC w Diff:  Lab Results  Component Value Date   WBC 6.5 05/20/2019   HGB 10.2 (L) 05/20/2019   HCT 32.4 (L) 05/20/2019   PLT 403 (H) 05/20/2019   LYMPHOPCT 19 04/13/2019   MONOPCT 7 04/13/2019   EOSPCT 1 04/13/2019   BASOPCT 0 04/13/2019    CMP:  Lab Results  Component Value Date   NA 133 (L) 05/20/2019   K 3.3 (L) 05/20/2019   CL 94 (L) 05/20/2019   CO2 26 05/20/2019   BUN 14 05/20/2019   CREATININE 0.85 05/20/2019   PROT 8.0 05/20/2019   ALBUMIN 4.2 05/20/2019   BILITOT 0.9 05/20/2019   ALKPHOS 88 05/20/2019   AST 12 (L) 05/20/2019   ALT 11 05/20/2019  .   Total Time in preparing paper work, data evaluation and  todays exam - 35 minutes  Epifanio Lesches M.D on 05/17/2019 at 8:56 AM    Note: This dictation was prepared with Dragon dictation along with smaller phrase technology. Any transcriptional errors that result from this process are unintentional.

## 2019-05-21 NOTE — ED Provider Notes (Signed)
[facility] Emergency Department Provider Note   ____________________________________________   First MD Initiated Contact with Patient 05/21/19 [phone]     (approximate)  I have reviewed the triage vital signs and the nursing notes.   HISTORY  Chief Complaint Abdominal Pain    HPI Kenneth Copeland is a 53 y.o. male brought to the ED from home via EMS with a chief complaint of left upper abdominal pain, nausea and vomiting.  Patient has a history of liver cancer, gastroparesis, numerous hospitalizations for intractable nausea/vomiting secondary to gastroparesis as well as pancreatitis.  Patient was hospitalized 6/20-6/25 for same.  States symptoms restarted on 6/26 and he has been vomiting all weekend.  Denies fever, cough, chest pain, shortness of breath.  Denies recent travel, trauma or exposure to persons diagnosed with coronavirus.       Past Medical History:  Diagnosis Date  . Arthritis   . Cancer ([place])   . Diabetes mellitus without complication ([place])   . Hypertension   . Liver cancer [facility])     Patient Active Problem List   Diagnosis Date Noted  . Gastroparesis due to DM ([place]) 05/12/2019  . Acute respiratory failure ([place])   . Chest pain 04/08/2019  . Pericardial effusion 04/02/2019    Past Surgical History:  Procedure Laterality Date  . HERNIA REPAIR    . LIVER LOBECTOMY    . TEE WITHOUT CARDIOVERSION N/A 04/11/2019   Procedure: TRANSESOPHAGEAL ECHOCARDIOGRAM (TEE);  Surgeon: Corey Skains, MD;  Location: [facility] ORS;  Service: Cardiovascular;  Laterality: N/A;    Prior to Admission medications   Medication Sig Start Date End Date Taking? Authorizing Provider  acetaminophen (TYLENOL) 325 MG tablet Take 650 mg by mouth every 6 (six) hours as needed for mild pain or fever.   Yes [provider]  calcium carbonate (TUMS - DOSED IN MG ELEMENTAL CALCIUM) 500 MG chewable tablet Chew 1 tablet (200 mg of elemental calcium total) by mouth 3  (three) times daily as needed for indigestion or heartburn. 04/25/19  Yes Gouru, Illene Silver, MD  colchicine 0.6 MG tablet Take 1 tablet (0.6 mg total) by mouth 2 (two) times daily. 04/05/19  Yes Gladstone Lighter, MD  dicyclomine (BENTYL) 10 MG capsule Take 10 mg by mouth 4 (four) times daily.    Yes [provider]  docusate sodium (COLACE) 100 MG capsule Take 1 capsule (100 mg total) by mouth daily as needed for mild constipation. 04/25/19  Yes Gouru, Illene Silver, MD  doxycycline (VIBRA-TABS) 100 MG tablet Take 100 mg by mouth 2 (two) times a day. For 17 days 05/08/19  Yes [provider]  DULoxetine (CYMBALTA) 30 MG capsule Take 1 capsule (30 mg total) by mouth daily. 04/05/19 07/04/19 Yes Gladstone Lighter, MD  gabapentin (NEURONTIN) 300 MG capsule Take 1 capsule (300 mg total) by mouth 3 (three) times daily. 04/05/19  Yes Gladstone Lighter, MD  insulin aspart (NOVOLOG) 100 UNIT/ML injection Inject 5 Units into the skin 3 (three) times daily with meals. 04/25/19  Yes Gouru, Aruna, MD  insulin glargine (LANTUS) 100 UNIT/ML injection Inject 0.15 mLs (15 Units total) into the skin daily. 05/17/19  Yes Epifanio Lesches, MD  metoCLOPramide (REGLAN) 10 MG tablet Take 10 mg by mouth 4 (four) times daily -  before meals and at bedtime.    Yes [provider]  Multiple Vitamin (MULTIVITAMIN WITH MINERALS) TABS tablet Take 1 tablet by mouth daily. 04/26/19  Yes Gouru, Illene Silver, MD  ondansetron (ZOFRAN) 4 MG tablet  Take 1 tablet (4 mg total) by mouth every 6 (six) hours as needed for nausea. 04/25/19  Yes Gouru, Aruna, MD  oxyCODONE (ROXICODONE) 5 MG immediate release tablet Take 2 tablets (10 mg total) by mouth every 8 (eight) hours as needed. 05/17/19 05/16/20 Yes Epifanio Lesches, MD  pantoprazole (PROTONIX) 40 MG tablet Take 40 mg by mouth 2 (two) times daily.    Yes [provider]  sucralfate (CARAFATE) 1 g tablet Take 1 g by mouth 4 (four) times daily.    Yes [provider]   tamsulosin (FLOMAX) 0.4 MG CAPS capsule Take 1 capsule (0.4 mg total) by mouth daily. 04/05/19  Yes Gladstone Lighter, MD  amiodarone (PACERONE) 100 MG tablet Take 1 tablet (100 mg total) by mouth 2 (two) times daily. Patient not taking: Reported on 05/21/2019 04/25/19   Nicholes Mango, MD  carboxymethylcellulose (REFRESH PLUS) 0.5 % SOLN Place 2 drops into the left eye 3 (three) times daily.    [provider]  ipratropium-albuterol (DUONEB) 0.5-2.5 (3) MG/3ML SOLN Take 3 mLs by nebulization every 6 (six) hours as needed. Patient not taking: Reported on 05/21/2019 04/25/19   Nicholes Mango, MD    Allergies Metformin and related and Motrin [ibuprofen]  Family History  Family history unknown: Yes    Social History Social History   Tobacco Use  . Smoking status: Former Research scientist (life sciences)  . Smokeless tobacco: Never Used  Substance Use Topics  . Alcohol use: Not Currently  . Drug use: Yes    Frequency: 0.5 times per week    Types: Marijuana, Cocaine    Review of Systems  Constitutional: No fever/chills Eyes: No visual changes. ENT: No sore throat. Cardiovascular: Denies chest pain. Respiratory: Denies shortness of breath. Gastrointestinal: Positive for abdominal pain, nausea and vomiting.  No diarrhea.  No constipation. Genitourinary: Negative for dysuria. Musculoskeletal: Negative for back pain. Skin: Negative for rash. Neurological: Negative for headaches, focal weakness or numbness.   ____________________________________________   PHYSICAL EXAM:  VITAL SIGNS: ED Triage Vitals  Enc Vitals Group     BP 05/20/19 1928 (!) 141/111     Pulse Rate 05/20/19 1928 99     Resp 05/20/19 1928 20     Temp 05/20/19 1928 98.8 F (37.1 C)     Temp Source 05/20/19 1928 Oral     SpO2 05/20/19 1928 98 %     Weight 05/20/19 1926 180 lb (81.6 kg)     Height 05/20/19 1926 5\' 11"  (1.803 m)     Head Circumference --      Peak Flow --      Pain Score 05/20/19 1926 10     Pain Loc --       Pain Edu? --      Excl. in [place]? --     Constitutional: Alert and oriented.  Cachectic, chronically ill appearing and in mild acute distress. Eyes: Conjunctivae are normal. PERRL. EOMI. lateral icterus. Head: Atraumatic. Nose: No congestion/rhinnorhea. Mouth/Throat: Mucous membranes are moist.  Oropharynx non-erythematous. Neck: No stridor.   Cardiovascular: Normal rate, regular rhythm. Grossly normal heart sounds.  Good peripheral circulation. Respiratory: Normal respiratory effort.  No retractions. Lungs CTAB. Gastrointestinal: Soft and mildly tender to palpation left upper quadrant without rebound or guarding. No distention. No abdominal bruits. No CVA tenderness. Musculoskeletal: No lower extremity tenderness nor edema.  No joint effusions. Neurologic:  Normal speech and language. No gross focal neurologic deficits are appreciated.  Skin:  Skin is jaundiced, warm, dry and intact. No  rash noted. Psychiatric: Mood and affect are normal. Speech and behavior are normal.  ____________________________________________   LABS (all labs ordered are listed, but only abnormal results are displayed)  Labs Reviewed  COMPREHENSIVE METABOLIC PANEL - Abnormal; Notable for the following components:      Result Value   Sodium 133 (*)    Potassium 3.3 (*)    Chloride 94 (*)    Glucose, Bld 346 (*)    AST 12 (*)    All other components within normal limits  CBC - Abnormal; Notable for the following components:   RBC 4.09 (*)    Hemoglobin 10.2 (*)    HCT 32.4 (*)    MCV 79.2 (*)    MCH 24.9 (*)    RDW 16.0 (*)    Platelets 403 (*)    All other components within normal limits  LIPASE, BLOOD  URINALYSIS, COMPLETE (UACMP) WITH MICROSCOPIC   ____________________________________________  EKG  ED ECG REPORT I, Cannie Muckle J, the attending physician, personally viewed and interpreted this ECG.   Date: 05/21/2019  EKG Time: 1941  Rate: 101  Rhythm: normal EKG, normal sinus rhythm  Axis:  LAD  Intervals:none  ST&T Change: Nonspecific  ____________________________________________  RADIOLOGY  ED MD interpretation: None  Official radiology report(s): No results found.  ____________________________________________   PROCEDURES  Procedure(s) performed (including Critical Care):  Procedures   ____________________________________________   INITIAL IMPRESSION / ASSESSMENT AND PLAN / ED COURSE  As part of my medical decision making, I reviewed the following data within the [place] notes reviewed and incorporated, Labs reviewed, EKG interpreted, Old chart reviewed, Discussed with admitting physician and Notes from prior ED visits     Kenneth Copeland was evaluated in Emergency Department on 05/21/2019 for the symptoms described in the history of present illness. He was evaluated in the context of the global COVID-19 pandemic, which necessitated consideration that the patient might be at risk for infection with the SARS-CoV-2 virus that causes COVID-19. Institutional protocols and algorithms that pertain to the evaluation of patients at risk for COVID-19 are in a state of rapid change based on information released by regulatory bodies including the CDC and federal and state organizations. These policies and algorithms were followed during the patient's care in the ED.   53 year old male with liver cancer, gastroparesis, pancreatitis who presents with abdominal pain, nausea and vomiting. Differential diagnosis includes, but is not limited to, biliary disease (biliary colic, acute cholecystitis, cholangitis, choledocholithiasis, etc), intrathoracic causes for epigastric abdominal pain including ACS, gastritis, duodenitis, pancreatitis, small bowel or large bowel obstruction, abdominal aortic aneurysm, hernia, and ulcer(s).  Patient continuing to vomit despite IV Zofran administration.  Will initiate IV fluid resuscitation, Reglan, Dilaudid.  Reviewed  patient's most recent hospitalization, CT scan and negative COVID.   Clinical Course as of May 20 608  Mon May 21, 2019  0607 Patient continuing to gag and vomit.  Discussed with hospitalist Dr. Marcille Blanco to evaluate patient in the emergency department for admission.   [JS]    Clinical Course User Index [JS] Paulette Blanch, MD     ____________________________________________   FINAL CLINICAL IMPRESSION(S) / ED DIAGNOSES  Final diagnoses:  Left upper quadrant pain  Gastroparesis  Intractable nausea and vomiting  Essential hypertension     ED Discharge Orders    None       Note:  This document was prepared using Dragon voice recognition software and may include unintentional dictation errors.   Lurline Hare  J, MD 05/21/19 [phone]

## 2019-05-21 NOTE — Progress Notes (Signed)
Pt is refusing all PO medication at this time. MD aware

## 2019-05-21 NOTE — ED Notes (Signed)
Attempted to call report to 1 C. Informed pt not assigned. Will give bedside report on arrival.

## 2019-05-21 NOTE — ED Notes (Signed)
Pt provided with warm blanket and emesis bag as requested;

## 2019-05-21 NOTE — ED Notes (Signed)
ED TO INPATIENT HANDOFF REPORT  ED Nurse Name and Phone #: [name] Name/Age/Gender Kenneth Copeland 53 y.o. male Room/Bed: ED06A/ED06A  Code Status   Code Status: Prior  Home/SNF/Other Home Patient oriented to: self, place, time and situation Is this baseline? Yes   Triage Complete: Triage complete  Chief Complaint EMS - abdominal pain  Triage Note Patient brought in by ems from home. Patient with complaint of left upper abdominal pain and vomiting that started on Friday. Patient was discharged from the hospital on Thursday for pancreatitis and gastroparesis.     Allergies Allergies  Allergen Reactions  . Metformin And Related     Pt. States it makes his stomach bleed because of a stomach ulcer  . Motrin [Ibuprofen]     Pt. States it "inflames his stomach, causes bleeding in stomach"    Level of Care/Admitting Diagnosis ED Disposition    ED Disposition Condition [place]: [place] [[id number]]  Level of Care: Med-Surg [16]  Covid Evaluation: Confirmed COVID Negative  Diagnosis: Intractable nausea and vomiting [889169]  Admitting Physician: Harrie Foreman [[id number]]  Attending Physician: Harrie Foreman [phone]  PT Class (Do Not Modify): Observation [104]  PT Acc Code (Do Not Modify): Observation [10022]       B Medical/Surgery History Past Medical History:  Diagnosis Date  . Arthritis   . Cancer ([place])   . Diabetes mellitus without complication ([place])   . Hypertension   . Liver cancer [facility])    Past Surgical History:  Procedure Laterality Date  . HERNIA REPAIR    . LIVER LOBECTOMY    . TEE WITHOUT CARDIOVERSION N/A 04/11/2019   Procedure: TRANSESOPHAGEAL ECHOCARDIOGRAM (TEE);  Surgeon: Corey Skains, MD;  Location: [facility] ORS;  Service: Cardiovascular;  Laterality: N/A;     A IV Location/Drains/Wounds Patient Lines/Drains/Airways Status   Active Line/Drains/Airways    Name:   Placement date:    Placement time:   Site:   Days:   Peripheral IV 05/20/19 Left Wrist   05/20/19    1930    Wrist   1          Intake/Output Last 24 hours No intake or output data in the 24 hours ending 05/21/19 0641  Labs/Imaging Results for orders placed or performed during the hospital encounter of 05/21/19 (from the past 48 hour(s))  Lipase, blood     Status: None   Collection Time: 05/20/19  7:51 PM  Result Value Ref Range   Lipase 26 11 - 51 U/L    Comment: Performed at [facility], [address] [place], [place] [address]  Comprehensive metabolic panel     Status: Abnormal   Collection Time: 05/20/19  7:51 PM  Result Value Ref Range   Sodium 133 (L) 135 - 145 mmol/L   Potassium 3.3 (L) 3.5 - 5.1 mmol/L   Chloride 94 (L) 98 - 111 mmol/L   CO2 26 22 - 32 mmol/L   Glucose, Bld 346 (H) 70 - 99 mg/dL   BUN 14 6 - 20 mg/dL   Creatinine, Ser 0.85 0.61 - 1.24 mg/dL   Calcium 8.9 8.9 - 10.3 mg/dL   Total Protein 8.0 6.5 - 8.1 g/dL   Albumin 4.2 3.5 - 5.0 g/dL   AST 12 (L) 15 - 41 U/L   ALT 11 0 - 44 U/L   Alkaline Phosphatase 88 38 - 126 U/L   Total Bilirubin 0.9 0.3 -  1.2 mg/dL   GFR calc non Af Amer >60 >60 mL/min   GFR calc Af Amer >60 >60 mL/min   Anion gap 13 5 - 15    Comment: Performed at [facility], [address] [place], [place] [address]  CBC     Status: Abnormal   Collection Time: 05/20/19  7:51 PM  Result Value Ref Range   WBC 6.5 4.0 - 10.5 K/uL   RBC 4.09 (L) 4.22 - 5.81 MIL/uL   Hemoglobin 10.2 (L) 13.0 - 17.0 g/dL   HCT 32.4 (L) 39.0 - 52.0 %   MCV 79.2 (L) 80.0 - 100.0 fL   MCH 24.9 (L) 26.0 - 34.0 pg   MCHC 31.5 30.0 - 36.0 g/dL   RDW 16.0 (H) 11.5 - 15.5 %   Platelets 403 (H) 150 - 400 K/uL   nRBC 0.0 0.0 - 0.2 %    Comment: Performed at [facility], [address], [place], [place] [address]   No results found.  Pending Labs Unresulted Labs (From admission, onward)    Start     Ordered   05/20/19 1931  Urinalysis,  Complete w Microscopic  ONCE - STAT,   STAT     05/20/19 1931   Signed and Held  Creatinine, serum  (enoxaparin (LOVENOX)    CrCl >/= 30 ml/min)  Weekly,   R    Comments: while on enoxaparin therapy    Signed and Held   Signed and Held  TSH  Add-on,   R     Signed and Held          Vitals/Pain Today's Vitals   05/21/19 0528 05/21/19 0530 05/21/19 0545 05/21/19 0630  BP:  (!) 135/100  (!) 142/98  Pulse:  93 94 99  Resp:      Temp:      TempSrc:      SpO2:  97% 98% 97%  Weight:      Height:      PainSc: 8        Isolation Precautions No active isolations  Medications Medications  fentaNYL (SUBLIMAZE) injection 50 mcg (50 mcg Intravenous Given 05/21/19 0402)  metoCLOPramide (REGLAN) injection 5 mg (has no administration in time range)  ondansetron (ZOFRAN) injection 4 mg (4 mg Intravenous Given 05/20/19 1950)  sodium chloride 0.9 % bolus 1,000 mL (1,000 mLs Intravenous New Bag/Given 05/21/19 0506)  HYDROmorphone (DILAUDID) injection 1 mg (1 mg Intravenous Given 05/21/19 0506)  metoCLOPramide (REGLAN) injection 5 mg (5 mg Intravenous Given 05/21/19 0507)  hydrALAZINE (APRESOLINE) injection 10 mg (10 mg Intravenous Given 05/21/19 0522)    Mobility walks     Focused Assessments gi assessment   R Recommendations: See Admitting Provider Note  Report given to:   Additional Notes:

## 2019-05-21 NOTE — Progress Notes (Signed)
Inpatient Diabetes Program Recommendations  AACE/ADA: New Consensus Statement on Inpatient Glycemic Control  Target Ranges:  Prepandial:   less than 140 mg/dL      Peak postprandial:   less than 180 mg/dL (1-2 hours)      Critically ill patients:  140 - 180 mg/dL   Results for Kenneth Copeland, Kenneth Copeland (MRN [id number]) as of 05/21/2019 12:06  Ref. Range 05/20/2019 19:51  Glucose Latest Ref Range: 70 - 99 mg/dL 346 (H)   Review of Glycemic Control  Diabetes history:DM2 Outpatient Diabetes medications:Lantus 15 units daily, Novolog 5 units TID with meals Current orders for Inpatient glycemic control:Lantus 10 units daily, Novolog 0-9 units TID with meals   Inpatient Diabetes Program Recommendations:    Insulin-Correction: Please consider increasing Novolog correction to Moderate scale (0-15 units) and add Novolog 0-5 units QHS for bedtime correction.  Insulin-Meal Coverage: If patient is able to tolerate eating, he will likely need Novolog meal coverage.  Thanks, Barnie Alderman, RN, MSN, CDE Diabetes Coordinator Inpatient Diabetes Program [phone] (Team Pager from 8am to 5pm)

## 2019-05-22 ENCOUNTER — Inpatient Hospital Stay: Payer: Medicare (Managed Care) | Admitting: Infectious Diseases

## 2019-05-22 LAB — BASIC METABOLIC PANEL
Anion gap: 9 (ref 5–15)
BUN: 14 mg/dL (ref 6–20)
CO2: 24 mmol/L (ref 22–32)
Calcium: 8.8 mg/dL — ABNORMAL LOW (ref 8.9–10.3)
Chloride: 101 mmol/L (ref 98–111)
Creatinine, Ser: 0.98 mg/dL (ref 0.61–1.24)
GFR calc Af Amer: >60 mL/min (ref >60)
GFR calc non Af Amer: >60 mL/min (ref >60)
Glucose, Bld: 299 mg/dL — ABNORMAL HIGH (ref 70–99)
Potassium: 4 mmol/L (ref 3.5–5.1)
Sodium: 134 mmol/L — ABNORMAL LOW (ref 135–145)

## 2019-05-22 LAB — GLUCOSE, CAPILLARY
Glucose-Capillary: 357 mg/dL — ABNORMAL HIGH (ref 70–99)
Glucose-Capillary: 331 mg/dL — ABNORMAL HIGH (ref 70–99)

## 2019-05-22 MED ORDER — METOCLOPRAMIDE HCL 10 MG PO TABS: 10.0000 mg | ORAL_TABLET | Freq: Three times a day (TID) | ORAL | 0 refills | Status: AC

## 2019-05-22 MED ORDER — ONDANSETRON HCL 4 MG PO TABS: 4.0000 mg | ORAL_TABLET | Freq: Four times a day (QID) | ORAL | 0 refills | Status: AC | PRN

## 2019-05-22 MED ORDER — INSULIN GLARGINE 100 UNIT/ML ~~LOC~~ SOLN
15.0000 [IU] | Freq: Every day | SUBCUTANEOUS | Status: DC
  Filled 2019-05-22: qty 0.15

## 2019-05-22 NOTE — Discharge Instructions (Signed)
Soft diet for 2-3 days and then advance to usual diet

## 2019-05-22 NOTE — Discharge Summary (Signed)
[facility] NAME: Cashawn Yanko    MR#:  [id number]  DATE OF BIRTH:  04-Aug-1966  DATE OF ADMISSION:  05/21/2019 ADMITTING PHYSICIAN: Harrie Foreman, MD  DATE OF DISCHARGE: 05/22/2019  3:48 PM  PRIMARY CARE PHYSICIAN: Patient, No Pcp Per   ADMISSION DIAGNOSIS:  Gastroparesis [K31.84] Left upper quadrant pain [R10.12] Intractable nausea and vomiting [R11.2] Essential hypertension [I10]  DISCHARGE DIAGNOSIS:  Active Problems:   Intractable nausea and vomiting   SECONDARY DIAGNOSIS:   Past Medical History:  Diagnosis Date  . Arthritis   . Cancer ([place])   . Diabetes mellitus without complication ([place])   . Hypertension   . Liver cancer ([place])      ADMITTING HISTORY  HPI:  the patient with past medical history of liver cancer, hypertension diabetes presents to the emergency department complaining of abdominal pain.  The patient was discharged from the hospital 2 days ago for the same.  He states that he has not had much improvement since being home.  Laboratory evaluation revealed mild hypokalemia as well as elevated serum glucose and hyponatremia.  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  Thematic analgesia without relief which prompted the emergency department staff to call the hospitalist service for admission.  HOSPITAL COURSE:   This is a 53 year old male admitted for intractable nausea and vomiting 1.  Nausea and vomiting: Intractable Recurrent issue due to gastroparesis.  Started on scheduled IV Reglan and symptoms improved.  On the day of discharge patient has tolerated his regular diabetic diet with no nausea or vomiting.  He does have persistent chronic abdominal pain which is unchanged.  Recent CT scan of the abdomen reviewed and shows chronic changes.  Nothing acute. 2.   Diabetes mellitus type 2: Continued on insulin dosing from home and sliding scale insulin. 3.  Hypertension: Uncontrolled;  Restarted home medications and well controlled by time of discharge 4.  Liver cancer: The patient reportedly received 2 rounds of chemotherapy in [place].  He has been in remission for little more than a year.  Continue surveillance for any signs or symptoms of metastasis.  Patient stable for discharge home.  Given prescriptions for Reglan.  Follow-up with primary care physician.  CONSULTS OBTAINED:    DRUG ALLERGIES:   Allergies  Allergen Reactions  . Metformin And Related     Pt. States it makes his stomach bleed because of a stomach ulcer  . Motrin [Ibuprofen]     Pt. States it "inflames his stomach, causes bleeding in stomach"    DISCHARGE MEDICATIONS:   Allergies as of 05/22/2019      Reactions   Metformin And Related    Pt. States it makes his stomach bleed because of a stomach ulcer   Motrin [ibuprofen]    Pt. States it "inflames his stomach, causes bleeding in stomach"      Medication List    TAKE these medications   acetaminophen 325 MG tablet Commonly known as: TYLENOL Take 650 mg by mouth every 6 (six) hours as needed for mild pain or fever.   amiodarone 100 MG tablet Commonly known as: PACERONE Take 1 tablet (100 mg total) by mouth 2 (two) times daily.   calcium carbonate 500 MG chewable tablet Commonly known as: TUMS - dosed in mg elemental calcium Chew 1 tablet (200 mg of elemental  calcium total) by mouth 3 (three) times daily as needed for indigestion or heartburn.   carboxymethylcellulose 0.5 % Soln Commonly known as: REFRESH PLUS Place 2 drops into the left eye 3 (three) times daily.   colchicine 0.6 MG tablet Take 1 tablet (0.6 mg total) by mouth 2 (two) times daily.   dicyclomine 10 MG capsule Commonly known as: BENTYL Take 10 mg by mouth 4 (four) times daily.   docusate sodium 100 MG capsule Commonly known as:  COLACE Take 1 capsule (100 mg total) by mouth daily as needed for mild constipation.   doxycycline 100 MG tablet Commonly known as: VIBRA-TABS Take 100 mg by mouth 2 (two) times a day. For 17 days   DULoxetine 30 MG capsule Commonly known as: Cymbalta Take 1 capsule (30 mg total) by mouth daily.   gabapentin 300 MG capsule Commonly known as: NEURONTIN Take 1 capsule (300 mg total) by mouth 3 (three) times daily.   insulin aspart 100 UNIT/ML injection Commonly known as: novoLOG Inject 5 Units into the skin 3 (three) times daily with meals.   insulin glargine 100 UNIT/ML injection Commonly known as: LANTUS Inject 0.15 mLs (15 Units total) into the skin daily.   ipratropium-albuterol 0.5-2.5 (3) MG/3ML Soln Commonly known as: DUONEB Take 3 mLs by nebulization every 6 (six) hours as needed.   metoCLOPramide 10 MG tablet Commonly known as: REGLAN Take 1 tablet (10 mg total) by mouth 4 (four) times daily -  before meals and at bedtime.   multivitamin with minerals Tabs tablet Take 1 tablet by mouth daily.   ondansetron 4 MG tablet Commonly known as: ZOFRAN Take 1 tablet (4 mg total) by mouth every 6 (six) hours as needed for nausea.   oxyCODONE 5 MG immediate release tablet Commonly known as: Roxicodone Take 2 tablets (10 mg total) by mouth every 8 (eight) hours as needed.   pantoprazole 40 MG tablet Commonly known as: PROTONIX Take 40 mg by mouth 2 (two) times daily.   sucralfate 1 g tablet Commonly known as: CARAFATE Take 1 g by mouth 4 (four) times daily.   tamsulosin 0.4 MG Caps capsule Commonly known as: FLOMAX Take 1 capsule (0.4 mg total) by mouth daily.       Today   VITAL SIGNS:  Blood pressure (!) 155/106, pulse 89, temperature 97.6 F (36.4 C), temperature source Oral, resp. rate 20, height 5\' 11"  (1.803 m), weight 78 kg, SpO2 99 %.  I/O:    Intake/Output Summary (Last 24 hours) at 05/22/2019 1603 Last data filed at 05/22/2019 1300 Gross per 24  hour  Intake 2889.35 ml  Output 1950 ml  Net 939.35 ml    PHYSICAL EXAMINATION:  Physical Exam  GENERAL:  53 y.o.-year-old patient lying in the bed with no acute distress.  LUNGS: Normal breath sounds bilaterally, no wheezing, rales,rhonchi or crepitation. No use of accessory muscles of respiration.  CARDIOVASCULAR: S1, S2 normal. No murmurs, rubs, or gallops.  ABDOMEN: Soft, non-tender, non-distended. Bowel sounds present. No organomegaly or mass.  NEUROLOGIC: Moves all 4 extremities. PSYCHIATRIC: The patient is alert and oriented x 3.  SKIN: No obvious rash, lesion, or ulcer.   DATA REVIEW:   CBC Recent Labs  Lab 05/20/19 1951  WBC 6.5  HGB 10.2*  HCT 32.4*  PLT 403*    Chemistries  Recent Labs  Lab 05/20/19 1951 05/22/19 0341  NA 133* 134*  K 3.3* 4.0  CL 94* 101  CO2 26 24  GLUCOSE 346* 299*  BUN 14 14  CREATININE 0.85 0.98  CALCIUM 8.9 8.8*  AST 12*  --   ALT 11  --   ALKPHOS 88  --   BILITOT 0.9  --     Cardiac Enzymes No results for input(s): TROPONINI in the last 168 hours.  Microbiology Results  Results for orders placed or performed during the hospital encounter of 05/21/19  SARS Coronavirus 2 (CEPHEID - Performed in [place] hospital lab), Hosp Order     Status: None   Collection Time: 05/21/19  7:35 AM   Specimen: Nasopharyngeal Swab  Result Value Ref Range Status   SARS Coronavirus 2 NEGATIVE NEGATIVE Final    Comment: (NOTE) If result is NEGATIVE SARS-CoV-2 target nucleic acids are NOT DETECTED. The SARS-CoV-2 RNA is generally detectable in upper and lower  respiratory specimens during the acute phase of infection. The lowest  concentration of SARS-CoV-2 viral copies this assay can detect is 250  copies / mL. A negative result does not preclude SARS-CoV-2 infection  and should not be used as the sole basis for treatment or other  patient management decisions.  A negative result may occur with  improper specimen collection / handling,  submission of specimen other  than nasopharyngeal swab, presence of viral mutation(s) within the  areas targeted by this assay, and inadequate number of viral copies  (<250 copies / mL). A negative result must be combined with clinical  observations, patient history, and epidemiological information. If result is POSITIVE SARS-CoV-2 target nucleic acids are DETECTED. The SARS-CoV-2 RNA is generally detectable in upper and lower  respiratory specimens dur ing the acute phase of infection.  Positive  results are indicative of active infection with SARS-CoV-2.  Clinical  correlation with patient history and other diagnostic information is  necessary to determine patient infection status.  Positive results do  not rule out bacterial infection or co-infection with other viruses. If result is PRESUMPTIVE POSTIVE SARS-CoV-2 nucleic acids MAY BE PRESENT.   A presumptive positive result was obtained on the submitted specimen  and confirmed on repeat testing.  While 2019 novel coronavirus  (SARS-CoV-2) nucleic acids may be present in the submitted sample  additional confirmatory testing may be necessary for epidemiological  and / or clinical management purposes  to differentiate between  SARS-CoV-2 and other Sarbecovirus currently known to infect humans.  If clinically indicated additional testing with an alternate test  methodology [phone]) is advised. The SARS-CoV-2 RNA is generally  detectable in upper and lower respiratory sp ecimens during the acute  phase of infection. The expected result is Negative. Fact Sheet for Patients:  StrictlyIdeas.no Fact Sheet for Healthcare Providers: BankingDealers.co.za This test is not yet approved or cleared by the [place] FDA and has been authorized for detection and/or diagnosis of SARS-CoV-2 by FDA under an Emergency Use Authorization (EUA).  This EUA will remain in effect (meaning this test can be  used) for the duration of the COVID-19 declaration under Section 564(b)(1) of the Act, 21 U.S.C. section 360bbb-3(b)(1), unless the authorization is terminated or revoked sooner. Performed at [facility], [address], [place], [place] [address]     RADIOLOGY:  No results found.  Follow up with PCP in 1 week.  Management plans discussed with the patient, family and they are in agreement.  CODE STATUS:     Code Status Orders  (From admission, onward)         Start     Ordered   05/21/19 [phone]  Full code  Continuous     05/21/19 0816        Code Status History    Date Active Date Inactive Code Status Order ID Comments User Context   05/13/2019 0419 05/17/2019 1827 Full Code [id number]  Mayer Camel, NP ED   04/08/2019 1224 04/25/2019 2346 Full Code [id number]  Henreitta Leber, MD Inpatient   04/02/2019 1332 04/05/2019 1916 Full Code [id number]  Lang Snow, NP ED   Advance Care Planning Activity      TOTAL TIME TAKING CARE OF THIS PATIENT ON DAY OF DISCHARGE: more than 30 minutes.   Leia Alf Nayeliz Hipp M.D on 05/22/2019 at 4:03 PM  Between 7am to 6pm - Pager - [phone]  After 6pm go to [email or web address] - password [email or web address] [facility]  Office  [phone]  CC: Primary care physician; Patient, No Pcp Per  Note: This dictation was prepared with Dragon dictation along with smaller phrase technology. Any transcriptional errors that result from this process are unintentional.

## 2019-05-22 NOTE — Care Management Obs Status (Signed)
[facility] [place] NOTIFICATION   Patient Details  Name: Kenneth Copeland MRN: [id number] Date of Birth: 1966/09/22   Medicare Observation Status Notification Given:  Yes    Johan Antonacci, Veronia Beets, LCSW 05/22/2019, 11:08 AM

## 2019-05-22 NOTE — Progress Notes (Signed)
Inpatient Diabetes Program Recommendations  AACE/ADA: New Consensus Statement on Inpatient Glycemic Control   Target Ranges:  Prepandial:   less than 140 mg/dL      Peak postprandial:   less than 180 mg/dL (1-2 hours)      Critically ill patients:  140 - 180 mg/dL   Results for Kenneth Copeland, Kenneth Copeland (MRN [id number]) as of 05/22/2019 10:08  Ref. Range 05/17/2019 07:44 05/17/2019 11:58 05/21/2019 17:13 05/21/2019 20:46 05/22/2019 07:56  Glucose-Capillary Latest Ref Range: 70 - 99 mg/dL 253 (H) 171 (H) 334 (H) 282 (H) 357 (H)   Review of Glycemic Control  Diabetes history:DM2 Outpatient Diabetes medications:Lantus 15 units daily, Novolog 5 units TID with meals Current orders for Inpatient glycemic control:Lantus 15 units daily,Novolog 0-9 unitsTID with meals   Inpatient Diabetes Program Recommendations:    Insulin-Correction: Please consider adding Novolog 0-5 units QHS for bedtime correction.  Insulin-Meal Coverage: Please consider ordering Novolog 4 units TID with meals for meal coverage if patient eats at least 50% of meals.  Thanks, Barnie Alderman, RN, MSN, CDE Diabetes Coordinator Inpatient Diabetes Program [phone] (Team Pager from 8am to 5pm)

## 2019-05-24 ENCOUNTER — Encounter: Payer: Self-pay | Admitting: Emergency Medicine

## 2019-05-24 ENCOUNTER — Emergency Department: Payer: Medicare Other

## 2019-05-24 ENCOUNTER — Other Ambulatory Visit: Payer: Self-pay

## 2019-05-24 ENCOUNTER — Inpatient Hospital Stay
Admission: EM | Admit: 2019-05-24 | Discharge: 2019-05-26 | DRG: 074 | Disposition: A | Discharge status: Home / Self Care | Payer: Medicare Other | Attending: Internal Medicine | Admitting: Internal Medicine

## 2019-05-24 DIAGNOSIS — Z888 Allergy status to other drugs, medicaments and biological substances status: Secondary | ICD-10-CM

## 2019-05-24 DIAGNOSIS — Z8505 Personal history of malignant neoplasm of liver: Secondary | ICD-10-CM | POA: Diagnosis not present

## 2019-05-24 DIAGNOSIS — Z79891 Long term (current) use of opiate analgesic: Secondary | ICD-10-CM

## 2019-05-24 DIAGNOSIS — I10 Essential (primary) hypertension: Secondary | ICD-10-CM | POA: Diagnosis present

## 2019-05-24 DIAGNOSIS — E1143 Type 2 diabetes mellitus with diabetic autonomic (poly)neuropathy: Principal | ICD-10-CM | POA: Diagnosis present

## 2019-05-24 DIAGNOSIS — Z886 Allergy status to analgesic agent status: Secondary | ICD-10-CM

## 2019-05-24 DIAGNOSIS — K861 Other chronic pancreatitis: Secondary | ICD-10-CM | POA: Diagnosis present

## 2019-05-24 DIAGNOSIS — Z79899 Other long term (current) drug therapy: Secondary | ICD-10-CM | POA: Diagnosis not present

## 2019-05-24 DIAGNOSIS — Z794 Long term (current) use of insulin: Secondary | ICD-10-CM | POA: Diagnosis not present

## 2019-05-24 DIAGNOSIS — Z1159 Encounter for screening for other viral diseases: Secondary | ICD-10-CM | POA: Diagnosis not present

## 2019-05-24 DIAGNOSIS — K3184 Gastroparesis: Secondary | ICD-10-CM | POA: Diagnosis present

## 2019-05-24 DIAGNOSIS — E1165 Type 2 diabetes mellitus with hyperglycemia: Secondary | ICD-10-CM | POA: Diagnosis not present

## 2019-05-24 DIAGNOSIS — Z87891 Personal history of nicotine dependence: Secondary | ICD-10-CM

## 2019-05-24 DIAGNOSIS — E876 Hypokalemia: Secondary | ICD-10-CM | POA: Diagnosis not present

## 2019-05-24 DIAGNOSIS — R112 Nausea with vomiting, unspecified: Secondary | ICD-10-CM

## 2019-05-24 LAB — TSH: TSH: 1.309 u[IU]/mL (ref 0.350–4.500)

## 2019-05-24 LAB — COMPREHENSIVE METABOLIC PANEL
ALT: 11 U/L (ref 0–44)
AST: 16 U/L (ref 15–41)
Albumin: 4.7 g/dL (ref 3.5–5.0)
Alkaline Phosphatase: 80 U/L (ref 38–126)
Anion gap: 16 — ABNORMAL HIGH (ref 5–15)
BUN: 15 mg/dL (ref 6–20)
CO2: 20 mmol/L — ABNORMAL LOW (ref 22–32)
Calcium: 9.6 mg/dL (ref 8.9–10.3)
Chloride: 97 mmol/L — ABNORMAL LOW (ref 98–111)
Creatinine, Ser: 0.93 mg/dL (ref 0.61–1.24)
GFR calc Af Amer: >60 mL/min (ref >60)
GFR calc non Af Amer: >60 mL/min (ref >60)
Glucose, Bld: 196 mg/dL — ABNORMAL HIGH (ref 70–99)
Potassium: 3.5 mmol/L (ref 3.5–5.1)
Sodium: 133 mmol/L — ABNORMAL LOW (ref 135–145)
Total Bilirubin: 0.7 mg/dL (ref 0.3–1.2)
Total Protein: 9.1 g/dL — ABNORMAL HIGH (ref 6.5–8.1)

## 2019-05-24 LAB — CBC WITH DIFFERENTIAL/PLATELET
Abs Immature Granulocytes: 0.04 10*3/uL (ref 0.00–0.07)
Basophils Absolute: 0.1 10*3/uL (ref 0.0–0.1)
Basophils Relative: 1 %
Eosinophils Absolute: 0 10*3/uL (ref 0.0–0.5)
Eosinophils Relative: 0 %
HCT: 33.5 % — ABNORMAL LOW (ref 39.0–52.0)
Hemoglobin: 10.7 g/dL — ABNORMAL LOW (ref 13.0–17.0)
Immature Granulocytes: 0 %
Lymphocytes Relative: 14 %
Lymphs Abs: 1.6 10*3/uL (ref 0.7–4.0)
MCH: 25.1 pg — ABNORMAL LOW (ref 26.0–34.0)
MCHC: 31.9 g/dL (ref 30.0–36.0)
MCV: 78.6 fL — ABNORMAL LOW (ref 80.0–100.0)
Monocytes Absolute: 0.5 10*3/uL (ref 0.1–1.0)
Monocytes Relative: 4 %
Neutro Abs: 9.6 10*3/uL — ABNORMAL HIGH (ref 1.7–7.7)
Neutrophils Relative %: 81 %
Platelets: 355 10*3/uL (ref 150–400)
RBC: 4.26 MIL/uL (ref 4.22–5.81)
RDW: 16.3 % — ABNORMAL HIGH (ref 11.5–15.5)
WBC: 11.9 10*3/uL — ABNORMAL HIGH (ref 4.0–10.5)
nRBC: 0 % (ref 0.0–0.2)

## 2019-05-24 LAB — GLUCOSE, CAPILLARY: Glucose-Capillary: 273 mg/dL — ABNORMAL HIGH (ref 70–99)

## 2019-05-24 LAB — LIPASE, BLOOD: Lipase: 30 U/L (ref 11–51)

## 2019-05-24 MED ORDER — GABAPENTIN 300 MG PO CAPS
300.0000 mg | ORAL_CAPSULE | Freq: Three times a day (TID) | ORAL | Status: DC
  Administered 2019-05-25 – 2019-05-26 (×4): 300 mg via ORAL
  Filled 2019-05-24 (×5): qty 1

## 2019-05-24 MED ORDER — SUCRALFATE 1 G PO TABS
1.0000 g | ORAL_TABLET | Freq: Four times a day (QID) | ORAL | Status: DC
  Administered 2019-05-25 – 2019-05-26 (×4): 1 g via ORAL
  Filled 2019-05-24 (×6): qty 1

## 2019-05-24 MED ORDER — METOCLOPRAMIDE HCL 5 MG/ML IJ SOLN
10.0000 mg | Freq: Four times a day (QID) | INTRAMUSCULAR | Status: DC
  Administered 2019-05-25 – 2019-05-26 (×6): 10 mg via INTRAVENOUS
  Filled 2019-05-24 (×6): qty 2

## 2019-05-24 MED ORDER — ONDANSETRON HCL 4 MG PO TABS: 4.0000 mg | ORAL_TABLET | Freq: Four times a day (QID) | ORAL | Status: DC | PRN

## 2019-05-24 MED ORDER — DULOXETINE HCL 30 MG PO CPEP
30.0000 mg | ORAL_CAPSULE | Freq: Every day | ORAL | Status: DC
  Administered 2019-05-25 – 2019-05-26 (×2): 30 mg via ORAL
  Filled 2019-05-24 (×2): qty 1

## 2019-05-24 MED ORDER — FOLIC ACID 5 MG/ML IJ SOLN: 1.0000 mg | Freq: Every day | INTRAMUSCULAR | Status: DC

## 2019-05-24 MED ORDER — MORPHINE SULFATE (PF) 4 MG/ML IV SOLN
4.0000 mg | Freq: Once | INTRAVENOUS | Status: AC
  Administered 2019-05-24: 4 mg via INTRAVENOUS
  Filled 2019-05-24: qty 1

## 2019-05-24 MED ORDER — POLYETHYLENE GLYCOL 3350 17 G PO PACK: 17.0000 g | PACK | Freq: Every day | ORAL | Status: DC | PRN

## 2019-05-24 MED ORDER — THIAMINE HCL 100 MG/ML IJ SOLN: 100.0000 mg | Freq: Every day | INTRAMUSCULAR | Status: DC

## 2019-05-24 MED ORDER — KETOROLAC TROMETHAMINE 30 MG/ML IJ SOLN
INTRAMUSCULAR | Status: AC
  Administered 2019-05-24: 30 mg
  Filled 2019-05-24: qty 1

## 2019-05-24 MED ORDER — ONDANSETRON HCL 4 MG/2ML IJ SOLN
4.0000 mg | Freq: Once | INTRAMUSCULAR | Status: AC
  Administered 2019-05-24: 4 mg via INTRAVENOUS
  Filled 2019-05-24: qty 2

## 2019-05-24 MED ORDER — CALCIUM CARBONATE ANTACID 500 MG PO CHEW: 1.0000 | CHEWABLE_TABLET | Freq: Three times a day (TID) | ORAL | Status: DC | PRN

## 2019-05-24 MED ORDER — ADULT MULTIVITAMIN W/MINERALS CH
1.0000 | ORAL_TABLET | Freq: Every day | ORAL | Status: DC
  Administered 2019-05-25 – 2019-05-26 (×2): 1 via ORAL
  Filled 2019-05-24 (×2): qty 1

## 2019-05-24 MED ORDER — COLCHICINE 0.6 MG PO TABS
0.6000 mg | ORAL_TABLET | Freq: Two times a day (BID) | ORAL | Status: DC
  Administered 2019-05-25 – 2019-05-26 (×3): 0.6 mg via ORAL
  Filled 2019-05-24 (×5): qty 1

## 2019-05-24 MED ORDER — ACETAMINOPHEN 325 MG PO TABS: 650.0000 mg | ORAL_TABLET | Freq: Four times a day (QID) | ORAL | Status: DC | PRN

## 2019-05-24 MED ORDER — ONDANSETRON HCL 4 MG/2ML IJ SOLN
4.0000 mg | Freq: Four times a day (QID) | INTRAMUSCULAR | Status: DC | PRN
  Administered 2019-05-24 – 2019-05-25 (×2): 4 mg via INTRAVENOUS
  Filled 2019-05-24: qty 2

## 2019-05-24 MED ORDER — DICYCLOMINE HCL 10 MG PO CAPS
10.0000 mg | ORAL_CAPSULE | Freq: Four times a day (QID) | ORAL | Status: DC
  Administered 2019-05-25 – 2019-05-26 (×3): 10 mg via ORAL
  Filled 2019-05-24 (×8): qty 1

## 2019-05-24 MED ORDER — INSULIN ASPART 100 UNIT/ML ~~LOC~~ SOLN
0.0000 [IU] | Freq: Three times a day (TID) | SUBCUTANEOUS | Status: DC
  Administered 2019-05-25: 5 [IU] via SUBCUTANEOUS
  Administered 2019-05-25: 17:00:00 2 [IU] via SUBCUTANEOUS
  Administered 2019-05-25 – 2019-05-26 (×3): 11 [IU] via SUBCUTANEOUS
  Filled 2019-05-24 (×5): qty 1

## 2019-05-24 MED ORDER — DOCUSATE SODIUM 100 MG PO CAPS: 100.0000 mg | ORAL_CAPSULE | Freq: Every day | ORAL | Status: DC | PRN

## 2019-05-24 MED ORDER — MORPHINE SULFATE (PF) 2 MG/ML IV SOLN
2.0000 mg | INTRAVENOUS | Status: DC | PRN
  Administered 2019-05-24 – 2019-05-26 (×6): 2 mg via INTRAVENOUS
  Filled 2019-05-24 (×6): qty 1

## 2019-05-24 MED ORDER — IOHEXOL 300 MG/ML  SOLN
100.0000 mL | Freq: Once | INTRAMUSCULAR | Status: AC | PRN
  Administered 2019-05-24: 100 mL via INTRAVENOUS

## 2019-05-24 MED ORDER — ONDANSETRON HCL 4 MG/2ML IJ SOLN
INTRAMUSCULAR | Status: AC
  Administered 2019-05-24: 4 mg via INTRAVENOUS
  Filled 2019-05-24: qty 2

## 2019-05-24 MED ORDER — TAMSULOSIN HCL 0.4 MG PO CAPS
0.4000 mg | ORAL_CAPSULE | Freq: Every day | ORAL | Status: DC
  Administered 2019-05-25 – 2019-05-26 (×2): 0.4 mg via ORAL
  Filled 2019-05-24 (×2): qty 1

## 2019-05-24 MED ORDER — INSULIN ASPART 100 UNIT/ML ~~LOC~~ SOLN
0.0000 [IU] | Freq: Every day | SUBCUTANEOUS | Status: DC
  Administered 2019-05-24: 3 [IU] via SUBCUTANEOUS
  Administered 2019-05-25: 22:00:00 2 [IU] via SUBCUTANEOUS
  Filled 2019-05-24 (×2): qty 1

## 2019-05-24 MED ORDER — SODIUM CHLORIDE 0.9 % IV SOLN
INTRAVENOUS | Status: DC
  Administered 2019-05-24 – 2019-05-26 (×3): via INTRAVENOUS

## 2019-05-24 MED ORDER — KETOROLAC TROMETHAMINE 30 MG/ML IJ SOLN
15.0000 mg | Freq: Four times a day (QID) | INTRAMUSCULAR | Status: DC | PRN
  Administered 2019-05-26: 08:00:00 15 mg via INTRAVENOUS
  Filled 2019-05-24: qty 1

## 2019-05-24 MED ORDER — INSULIN ASPART 100 UNIT/ML ~~LOC~~ SOLN
5.0000 [IU] | Freq: Three times a day (TID) | SUBCUTANEOUS | Status: DC
  Administered 2019-05-25 – 2019-05-26 (×4): 5 [IU] via SUBCUTANEOUS
  Filled 2019-05-24 (×5): qty 1

## 2019-05-24 MED ORDER — ENOXAPARIN SODIUM 40 MG/0.4ML ~~LOC~~ SOLN
40.0000 mg | SUBCUTANEOUS | Status: DC
  Administered 2019-05-25 (×2): 40 mg via SUBCUTANEOUS
  Filled 2019-05-24 (×2): qty 0.4

## 2019-05-24 MED ORDER — PANTOPRAZOLE SODIUM 40 MG IV SOLR
40.0000 mg | Freq: Two times a day (BID) | INTRAVENOUS | Status: DC
  Administered 2019-05-25 (×3): 40 mg via INTRAVENOUS
  Filled 2019-05-24 (×3): qty 40

## 2019-05-24 MED ORDER — IPRATROPIUM-ALBUTEROL 0.5-2.5 (3) MG/3ML IN SOLN: 3.0000 mL | Freq: Four times a day (QID) | RESPIRATORY_TRACT | Status: DC | PRN

## 2019-05-24 MED ORDER — SODIUM CHLORIDE 0.9 % IV SOLN
1000.0000 mL | Freq: Once | INTRAVENOUS | Status: AC
  Administered 2019-05-24: 1000 mL via INTRAVENOUS

## 2019-05-24 NOTE — ED Notes (Signed)
.. ED TO INPATIENT HANDOFF REPORT  ED Nurse Name and Phone #: Deneise Lever [phone]  S Name/Age/Gender Kenneth Copeland 53 y.o. male Room/Bed: ED07A/ED07A  Code Status   Code Status: Full Code  Home/SNF/Other Home Patient oriented to: self, place, time and situation Is this baseline? Yes   Triage Complete: Triage complete  Chief Complaint Abd Pain  Triage Note Pr presents from home via acems with c/o abdominal pain. Pt recently d/c yesterday with diagnosis of pancreatitis. Pt has hypertension but not compliant with tx. 189/116 bp for ems. CBG 189.   Allergies Allergies  Allergen Reactions  . Metformin And Related     Pt. States it makes his stomach bleed because of a stomach ulcer  . Motrin [Ibuprofen]     Pt. States it "inflames his stomach, causes bleeding in stomach"    Level of Care/Admitting Diagnosis ED Disposition    ED Disposition Condition [place]: [place] [[id number]]  Level of Care: Med-Surg [16]  Covid Evaluation: Asymptomatic Screening Protocol (No Symptoms)  Diagnosis: Gastroparesis due to DM [facility]) [[id number]]  Admitting Physician: Nicholes Mango [[id number]]  Attending Physician: Nicholes Mango [[id number]]  Estimated length of stay: past midnight tomorrow  Certification:: I certify this patient will need inpatient services for at least 2 midnights  PT Class (Do Not Modify): Inpatient [101]  PT Acc Code (Do Not Modify): Private [1]       B Medical/Surgery History Past Medical History:  Diagnosis Date  . Arthritis   . Cancer ([place])   . Diabetes mellitus without complication ([place])   . Hypertension   . Liver cancer [facility])    Past Surgical History:  Procedure Laterality Date  . HERNIA REPAIR    . LIVER LOBECTOMY    . TEE WITHOUT CARDIOVERSION N/A 04/11/2019   Procedure: TRANSESOPHAGEAL ECHOCARDIOGRAM (TEE);  Surgeon: Corey Skains, MD;  Location: [facility] ORS;  Service: Cardiovascular;  Laterality: N/A;     A IV  Location/Drains/Wounds Patient Lines/Drains/Airways Status   Active Line/Drains/Airways    Name:   Placement date:   Placement time:   Site:   Days:   Peripheral IV 05/24/19 Right Antecubital   05/24/19    1656    Antecubital   less than 1          Intake/Output Last 24 hours  Intake/Output Summary (Last 24 hours) at 05/24/2019 2214 Last data filed at 05/24/2019 1843 Gross per 24 hour  Intake -  Output 900 ml  Net -900 ml    Labs/Imaging Results for orders placed or performed during the hospital encounter of 05/24/19 (from the past 48 hour(s))  CBC with Differential     Status: Abnormal   Collection Time: 05/24/19  5:23 PM  Result Value Ref Range   WBC 11.9 (H) 4.0 - 10.5 K/uL   RBC 4.26 4.22 - 5.81 MIL/uL   Hemoglobin 10.7 (L) 13.0 - 17.0 g/dL   HCT 33.5 (L) 39.0 - 52.0 %   MCV 78.6 (L) 80.0 - 100.0 fL   MCH 25.1 (L) 26.0 - 34.0 pg   MCHC 31.9 30.0 - 36.0 g/dL   RDW 16.3 (H) 11.5 - 15.5 %   Platelets 355 150 - 400 K/uL   nRBC 0.0 0.0 - 0.2 %   Neutrophils Relative % 81 %   Neutro Abs 9.6 (H) 1.7 - 7.7 K/uL   Lymphocytes Relative 14 %   Lymphs Abs 1.6 0.7 - 4.0 K/uL   Monocytes Relative 4 %  Monocytes Absolute 0.5 0.1 - 1.0 K/uL   Eosinophils Relative 0 %   Eosinophils Absolute 0.0 0.0 - 0.5 K/uL   Basophils Relative 1 %   Basophils Absolute 0.1 0.0 - 0.1 K/uL   Immature Granulocytes 0 %   Abs Immature Granulocytes 0.04 0.00 - 0.07 K/uL    Comment: Performed at [facility], [address] [place], [place] [address]  Comprehensive metabolic panel     Status: Abnormal   Collection Time: 05/24/19  5:23 PM  Result Value Ref Range   Sodium 133 (L) 135 - 145 mmol/L   Potassium 3.5 3.5 - 5.1 mmol/L   Chloride 97 (L) 98 - 111 mmol/L   CO2 20 (L) 22 - 32 mmol/L   Glucose, Bld 196 (H) 70 - 99 mg/dL   BUN 15 6 - 20 mg/dL   Creatinine, Ser 0.93 0.61 - 1.24 mg/dL   Calcium 9.6 8.9 - 10.3 mg/dL   Total Protein 9.1 (H) 6.5 - 8.1 g/dL   Albumin 4.7 3.5 - 5.0 g/dL    AST 16 15 - 41 U/L   ALT 11 0 - 44 U/L   Alkaline Phosphatase 80 38 - 126 U/L   Total Bilirubin 0.7 0.3 - 1.2 mg/dL   GFR calc non Af Amer >60 >60 mL/min   GFR calc Af Amer >60 >60 mL/min   Anion gap 16 (H) 5 - 15    Comment: Performed at [facility], [address] [place], [place] [address]  Lipase, blood     Status: None   Collection Time: 05/24/19  5:23 PM  Result Value Ref Range   Lipase 30 11 - 51 U/L    Comment: Performed at [facility], [address] [place], [place] [address]   Ct Abdomen Pelvis W Contrast  Result Date: 05/24/2019 CLINICAL DATA:  Pancreatitis EXAM: CT ABDOMEN AND PELVIS WITH CONTRAST TECHNIQUE: Multidetector CT imaging of the abdomen and pelvis was performed using the standard protocol following bolus administration of intravenous contrast. CONTRAST:  163mL OMNIPAQUE IOHEXOL 300 MG/ML  SOLN COMPARISON:  CT dated May 12, 2019 FINDINGS: Lower chest: The lung bases are clear.The heart size is mildly enlarged. There is a trace pericardial effusion which has improved from prior study. Hepatobiliary: Again identified are surgical clips related to prior hepatic resection. Status post cholecystectomy.There is mild intrahepatic and extrahepatic biliary ductal dilatation. This appears similar across prior studies. Pancreas: Again identified is an area of decreased attenuation involving the distal pancreatic body/pancreatic tail. This is stable from prior studies. Spleen: The spleen is mildly enlarged. Adrenals/Urinary Tract: --Adrenal glands: No adrenal hemorrhage. --Right kidney/ureter: No hydronephrosis or perinephric hematoma. --Left kidney/ureter: No hydronephrosis or perinephric hematoma. --Urinary bladder: Unremarkable. Stomach/Bowel: --Stomach/Duodenum: No hiatal hernia or other gastric abnormality. Normal duodenal course and caliber. --Small bowel: No dilatation or inflammation. --Colon: No focal abnormality. --Appendix: Not visualized. No right  lower quadrant inflammation or free fluid. Vascular/Lymphatic: Atherosclerotic calcification is present within the non-aneurysmal abdominal aorta, without hemodynamically significant stenosis. 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 The portal vein is patent. 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 --No retroperitoneal lymphadenopathy. --No mesenteric lymphadenopathy. --No pelvic or inguinal lymphadenopathy. Reproductive: Unremarkable Other: No ascites or free air. The abdominal wall is normal. Musculoskeletal. No acute displaced fractures. IMPRESSION: 1. No definite acute abnormality detected. 2. Stable appearance of the pancreatic tail with a persistent low-attenuation area of unknown clinical significance. 3. 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 4. Additional chronic findings as above. Electronically Signed   By: Constance Holster M.D.   On: 05/24/2019 17:39    Pending Labs Unresulted Labs (From admission, onward)    Start     Ordered   05/31/19 0500  Creatinine, serum  (enoxaparin (LOVENOX)    CrCl >/= 30 ml/min)  Weekly,   STAT    Comments: while on enoxaparin therapy    05/24/19 2143   05/25/19 1025  Basic metabolic panel  Tomorrow morning,   STAT     05/24/19 2143   05/25/19 0500  CBC  Tomorrow morning,   STAT     05/24/19 2143   05/24/19 2144  TSH  Once,   STAT     05/24/19 2143   05/24/19 2143  CBC  (enoxaparin (LOVENOX)    CrCl >/= 30 ml/min)  Once,   STAT    Comments: Baseline for enoxaparin therapy IF NOT ALREADY DRAWN.  Notify MD if PLT < 100 K.    05/24/19 2143   05/24/19 2143  Creatinine, serum  (enoxaparin (LOVENOX)    CrCl >/= 30 ml/min)  Once,   STAT    Comments: Baseline for enoxaparin therapy IF NOT ALREADY DRAWN.    05/24/19 2143   05/24/19 2143  Hemoglobin A1c  Once,   STAT     05/24/19 2143          Vitals/Pain Today's Vitals   05/24/19 1630 05/24/19 1656 05/24/19  1844 05/24/19 2153  BP: (!) 166/111     Pulse:  (!) 109    Resp:      SpO2:  100%    Weight:      Height:      PainSc:   8  10-Worst pain ever    Isolation Precautions No active isolations  Medications Medications  calcium carbonate (TUMS - dosed in mg elemental calcium) chewable tablet 200 mg of elemental calcium (has no administration in time range)  colchicine tablet 0.6 mg (has no administration in time range)  docusate sodium (COLACE) capsule 100 mg (has no administration in time range)  DULoxetine (CYMBALTA) DR capsule 30 mg (has no administration in time range)  gabapentin (NEURONTIN) capsule 300 mg (has no administration in time range)  insulin aspart (novoLOG) injection 5 Units (has no administration in time range)  ipratropium-albuterol (DUONEB) 0.5-2.5 (3) MG/3ML nebulizer solution 3 mL (has no administration in time range)  metoCLOPramide (REGLAN) injection 10 mg (has no administration in time range)  multivitamin with minerals tablet 1 tablet (has no administration in time range)  tamsulosin (FLOMAX) capsule 0.4 mg (has no administration in time range)  acetaminophen (TYLENOL) tablet 650 mg (has no administration in time range)  dicyclomine (BENTYL) capsule 10 mg (has no administration in time range)  sucralfate (CARAFATE) tablet 1 g (has no administration in time range)  pantoprazole (PROTONIX) injection 40 mg (has no administration in time range)  enoxaparin (LOVENOX) injection 40 mg (has no administration in time range)  0.9 %  sodium chloride infusion (has no administration in time range)  ketorolac (TORADOL) 30 MG/ML injection 15 mg (has no administration in time range)  polyethylene glycol (MIRALAX / GLYCOLAX) packet 17 g (has no administration in time range)  ondansetron (ZOFRAN) tablet 4 mg (has no administration in time range)    Or  ondansetron (ZOFRAN) injection 4 mg (has no administration in time range)  insulin aspart (novoLOG) injection 0-15 Units (has  no administration  in time range)  insulin aspart (novoLOG) injection 0-5 Units (has no administration in time range)  morphine 4 MG/ML injection 4 mg (4 mg Intravenous Given 05/24/19 1656)  ondansetron (ZOFRAN) injection 4 mg (4 mg Intravenous Given 05/24/19 1656)  0.9 %  sodium chloride infusion (0 mLs Intravenous Stopped 05/24/19 2155)  iohexol (OMNIPAQUE) 300 MG/ML solution 100 mL (100 mLs Intravenous Contrast Given 05/24/19 1705)  ondansetron (ZOFRAN) injection 4 mg (4 mg Intravenous Given 05/24/19 2110)  ondansetron (ZOFRAN) 4 MG/2ML injection (4 mg  Given 05/24/19 2153)  ketorolac (TORADOL) 30 MG/ML injection (30 mg  Given 05/24/19 2153)    Mobility walks Low fall risk   Focused Assessments GI   R Recommendations: See Admitting Provider Note  Report given to:   Additional Notes:

## 2019-05-24 NOTE — H&P (Signed)
[facility] [place] NAME: Kenneth Copeland    MR#:  [id number]  DATE OF BIRTH:  01-16-1966  DATE OF ADMISSION:  05/24/2019  PRIMARY CARE PHYSICIAN: Patient, No Pcp Per   REQUESTING/REFERRING PHYSICIAN: Lavonia Drafts, MD  CHIEF COMPLAINT:   Chief Complaint  Patient presents with  . Abdominal Pain    HISTORY OF PRESENT ILLNESS:  Kenneth Copeland  is a 53 y.o. male with a known history of chronic pancreatitis, gastroparesis, diabetes, liver cancer currently in remission.  He was recently discharged 3 days ago after being treated for chronic pancreatitis and gastroparesis.  He returns to the emergency room complaining of tractable nausea and vomiting with upper quadrant and epigastric abdominal pain described as cramping with a pain score 10 out of 10 while I am seeing the patient.  He tells me symptoms have been present since he was discharged however became worse today.  He denies hematemesis, hematochezia, or melena.  He denies fevers or chills.  He denies diarrhea.  He has continued p.o. antiemetics at home with no improvement in symptoms.  Abdominal CT completed with no acute abnormality seen.  He has been admitted to the hospitalist service for gastroparesis with intractable nausea and vomiting and chronic pancreatitis as well as abdominal pain.  PAST MEDICAL HISTORY:   Past Medical History:  Diagnosis Date  . Arthritis   . Cancer ([place])   . Diabetes mellitus without complication ([place])   . Hypertension   . Liver cancer ([place])     PAST SURGICAL HISTORY:   Past Surgical History:  Procedure Laterality Date  . HERNIA REPAIR    . LIVER LOBECTOMY    . TEE WITHOUT CARDIOVERSION N/A 04/11/2019   Procedure: TRANSESOPHAGEAL ECHOCARDIOGRAM (TEE);  Surgeon: Corey Skains, MD;  Location: [facility] ORS;  Service: Cardiovascular;  Laterality: N/A;    SOCIAL HISTORY:   Social History   Tobacco Use  . Smoking status: Former Research scientist (life sciences)  . Smokeless  tobacco: Never Used  Substance Use Topics  . Alcohol use: Not Currently    FAMILY HISTORY:   Family History  Family history unknown: Yes    DRUG ALLERGIES:   Allergies  Allergen Reactions  . Metformin And Related     Pt. States it makes his stomach bleed because of a stomach ulcer  . Motrin [Ibuprofen]     Pt. States it "inflames his stomach, causes bleeding in stomach"    REVIEW OF SYSTEMS:   Review of Systems  Constitutional: Positive for malaise/fatigue. Negative for chills and fever.  HENT: Negative for congestion, sinus pain and sore throat.   Eyes: Negative for blurred vision and double vision.  Respiratory: Negative for cough and shortness of breath.   Cardiovascular: Negative for chest pain and palpitations.  Gastrointestinal: Positive for abdominal pain, nausea and vomiting. Negative for blood in stool, constipation, diarrhea, heartburn and melena.  Genitourinary: Positive for dysuria. Negative for flank pain and frequency.  Musculoskeletal: Negative for falls and myalgias.  Skin: Negative for itching and rash.  Neurological: Negative for dizziness, weakness and headaches.  Psychiatric/Behavioral: Negative for depression.      MEDICATIONS AT HOME:   Prior to Admission medications   Medication Sig Start Date End Date Taking? Authorizing Provider  acetaminophen (TYLENOL) 325 MG tablet Take 650 mg by mouth every 6 (six) hours as needed for mild pain or fever.   Yes [provider]  calcium carbonate (TUMS - DOSED IN MG ELEMENTAL CALCIUM) 500  MG chewable tablet Chew 1 tablet (200 mg of elemental calcium total) by mouth 3 (three) times daily as needed for indigestion or heartburn. 04/25/19  Yes Gouru, Illene Silver, MD  colchicine 0.6 MG tablet Take 1 tablet (0.6 mg total) by mouth 2 (two) times daily. 04/05/19  Yes Gladstone Lighter, MD  dicyclomine (BENTYL) 10 MG capsule Take 10 mg by mouth 4 (four) times daily.    Yes [provider]  docusate sodium  (COLACE) 100 MG capsule Take 1 capsule (100 mg total) by mouth daily as needed for mild constipation. 04/25/19  Yes Gouru, Illene Silver, MD  doxycycline (VIBRA-TABS) 100 MG tablet Take 100 mg by mouth 2 (two) times a day. For 17 days 05/08/19  Yes [provider]  DULoxetine (CYMBALTA) 30 MG capsule Take 1 capsule (30 mg total) by mouth daily. 04/05/19 07/04/19 Yes Gladstone Lighter, MD  gabapentin (NEURONTIN) 300 MG capsule Take 1 capsule (300 mg total) by mouth 3 (three) times daily. 04/05/19  Yes Gladstone Lighter, MD  insulin aspart (NOVOLOG) 100 UNIT/ML injection Inject 5 Units into the skin 3 (three) times daily with meals. 04/25/19  Yes Gouru, Aruna, MD  insulin glargine (LANTUS) 100 UNIT/ML injection Inject 0.15 mLs (15 Units total) into the skin daily. Patient taking differently: Inject 40 Units into the skin 2 (two) times daily.  05/17/19  Yes Epifanio Lesches, MD  ipratropium-albuterol (DUONEB) 0.5-2.5 (3) MG/3ML SOLN Take 3 mLs by nebulization every 6 (six) hours as needed. 04/25/19  Yes Gouru, Illene Silver, MD  metoCLOPramide (REGLAN) 10 MG tablet Take 1 tablet (10 mg total) by mouth 4 (four) times daily -  before meals and at bedtime. 05/22/19  Yes Sudini, Alveta Heimlich, MD  Multiple Vitamin (MULTIVITAMIN WITH MINERALS) TABS tablet Take 1 tablet by mouth daily. 04/26/19  Yes Gouru, Aruna, MD  ondansetron (ZOFRAN) 4 MG tablet Take 1 tablet (4 mg total) by mouth every 6 (six) hours as needed for nausea. 05/22/19  Yes Sudini, Alveta Heimlich, MD  oxyCODONE (ROXICODONE) 5 MG immediate release tablet Take 2 tablets (10 mg total) by mouth every 8 (eight) hours as needed. 05/17/19 05/16/20 Yes Epifanio Lesches, MD  pantoprazole (PROTONIX) 40 MG tablet Take 40 mg by mouth 2 (two) times daily.    Yes [provider]  sucralfate (CARAFATE) 1 g tablet Take 1 g by mouth 4 (four) times daily.    Yes [provider]  tamsulosin (FLOMAX) 0.4 MG CAPS capsule Take 1 capsule (0.4 mg total) by mouth daily. 04/05/19   Yes Gladstone Lighter, MD      VITAL SIGNS:  Blood pressure (!) 166/111, pulse (!) 109, resp. rate (!) 25, height 5\' 11"  (1.803 m), weight 79.4 kg, SpO2 100 %.  PHYSICAL EXAMINATION:  Physical Exam  GENERAL:  53 y.o.-year-old ill appearing patient lying in the bed with no acute distress.  EYES: Pupils equal, round, reactive to light and accommodation. No scleral icterus. Extraocular muscles intact.  HEENT: Head atraumatic, normocephalic. Oropharynx and nasopharynx clear.  NECK:  Supple, no jugular venous distention. No thyroid enlargement, no tenderness.  LUNGS: Normal breath sounds bilaterally, no wheezing, rales,rhonchi or crepitation. No use of accessory muscles of respiration.  CARDIOVASCULAR: Regular rate and rhythm, S1, S2 normal. No murmurs, rubs, or gallops.  ABDOMEN: Abdomen is soft and nondistended however with bilateral upper quadrant and epigastric tenderness. Bowel sounds present. No organomegaly or mass.  EXTREMITIES: No pedal edema, cyanosis, or clubbing.  NEUROLOGIC: Cranial nerves II through XII are intact. Muscle strength 5/5 in all extremities.  Sensation intact. Gait not checked.  PSYCHIATRIC: The patient is alert and oriented x 3.  Normal affect and good eye contact. SKIN: No obvious rash, lesion, or ulcer.   LABORATORY PANEL:   CBC Recent Labs  Lab 05/24/19 1723  WBC 11.9*  HGB 10.7*  HCT 33.5*  PLT 355   ------------------------------------------------------------------------------------------------------------------  Chemistries  Recent Labs  Lab 05/24/19 1723  NA 133*  K 3.5  CL 97*  CO2 20*  GLUCOSE 196*  BUN 15  CREATININE 0.93  CALCIUM 9.6  AST 16  ALT 11  ALKPHOS 80  BILITOT 0.7   ------------------------------------------------------------------------------------------------------------------  Cardiac Enzymes No results for input(s): TROPONINI in the last 168  hours. ------------------------------------------------------------------------------------------------------------------  RADIOLOGY:  Ct Abdomen Pelvis W Contrast  Result Date: 05/24/2019 CLINICAL DATA:  Pancreatitis EXAM: CT ABDOMEN AND PELVIS WITH CONTRAST TECHNIQUE: Multidetector CT imaging of the abdomen and pelvis was performed using the standard protocol following bolus administration of intravenous contrast. CONTRAST:  129mL OMNIPAQUE IOHEXOL 300 MG/ML  SOLN COMPARISON:  CT dated May 12, 2019 FINDINGS: Lower chest: The lung bases are clear.The heart size is mildly enlarged. There is a trace pericardial effusion which has improved from prior study. Hepatobiliary: Again identified are surgical clips related to prior hepatic resection. Status post cholecystectomy.There is mild intrahepatic and extrahepatic biliary ductal dilatation. This appears similar across prior studies. Pancreas: Again identified is an area of decreased attenuation involving the distal pancreatic body/pancreatic tail. This is stable from prior studies. Spleen: The spleen is mildly enlarged. Adrenals/Urinary Tract: --Adrenal glands: No adrenal hemorrhage. --Right kidney/ureter: No hydronephrosis or perinephric hematoma. --Left kidney/ureter: No hydronephrosis or perinephric hematoma. --Urinary bladder: Unremarkable. Stomach/Bowel: --Stomach/Duodenum: No hiatal hernia or other gastric abnormality. Normal duodenal course and caliber. --Small bowel: No dilatation or inflammation. --Colon: No focal abnormality. --Appendix: Not visualized. No right lower quadrant inflammation or free fluid. Vascular/Lymphatic: Atherosclerotic calcification is present within the non-aneurysmal abdominal aorta, without hemodynamically significant stenosis. 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 The portal vein is patent. 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 --No retroperitoneal lymphadenopathy. --No  mesenteric lymphadenopathy. --No pelvic or inguinal lymphadenopathy. Reproductive: Unremarkable Other: No ascites or free air. The abdominal wall is normal. Musculoskeletal. No acute displaced fractures. IMPRESSION: 1. No definite acute abnormality detected. 2. Stable appearance of the pancreatic tail with a persistent low-attenuation area of unknown clinical significance. 3. 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 4. Additional chronic findings as above. Electronically Signed   By: Constance Holster M.D.   On: 05/24/2019 17:39      IMPRESSION AND PLAN:   1.  Gastroparesis - IV antiemetic - Normal saline infusing to peripheral IV 100 cc/h - Pain is being managed with IV analgesic -Reglan 4 times daily continued  2. 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  3. history of liver cancer - Reported by the patient to be in remission after being treated with chemotherapy in [place]  4.  Diabetes mellitus - Moderate sliding scale insulin --Scheduled Lantus insulin continued when patient taking p.o. well  DVT and PPI prophylaxis initiated    All the records are reviewed and case discussed with ED provider. The plan of care was discussed in details with the patient (and family). I answered all questions.  The patient agreed to proceed with the above mentioned plan. Further management will depend upon hospital course.   CODE STATUS: Full code  TOTAL TIME TAKING CARE OF THIS PATIENT: 45 minutes.    [place] on 05/24/2019 at 9:54 PM  Pager - [phone]  After 6pm go to [email or web address] - Proofreader  [facility]  Office  [phone]  CC: Primary care physician; Patient, No Pcp  Per   Note: This dictation was prepared with Dragon dictation along with smaller phrase technology. Any transcriptional errors that result from this process are unintentional.

## 2019-05-24 NOTE — ED Triage Notes (Addendum)
Pr presents from home via [facility] with c/o abdominal pain. Pt recently d/c yesterday with diagnosis of pancreatitis. Pt has hypertension but not compliant with tx. 189/116 bp for ems. CBG 189.

## 2019-05-24 NOTE — ED Provider Notes (Signed)
[facility] Emergency Department Provider Note   ____________________________________________    I have reviewed the triage vital signs and the nursing notes.   HISTORY  Chief Complaint Abdominal Pain     HPI Kenneth Copeland is a 53 y.o. male with a history of diabetes, gastroparesis, liver CA who presents with nausea vomiting upper abdominal pain.  Patient recently admitted to the hospital for the symptoms, reports they have not improved significantly since discharge.  Complains of constant nausea vomiting, nonbilious nonbloody.  Normal, and frequent stools.  Denies fevers or chills.  Has taken p.o. antiemetics without improvement  Past Medical History:  Diagnosis Date  . Arthritis   . Cancer ([place])   . Diabetes mellitus without complication ([place])   . Hypertension   . Liver cancer [facility])     Patient Active Problem List   Diagnosis Date Noted  . Intractable nausea and vomiting 05/21/2019  . Gastroparesis due to DM ([place]) 05/12/2019  . Acute respiratory failure ([place])   . Chest pain 04/08/2019  . Pericardial effusion 04/02/2019    Past Surgical History:  Procedure Laterality Date  . HERNIA REPAIR    . LIVER LOBECTOMY    . TEE WITHOUT CARDIOVERSION N/A 04/11/2019   Procedure: TRANSESOPHAGEAL ECHOCARDIOGRAM (TEE);  Surgeon: Corey Skains, MD;  Location: [facility] ORS;  Service: Cardiovascular;  Laterality: N/A;    Prior to Admission medications   Medication Sig Start Date End Date Taking? Authorizing Provider  acetaminophen (TYLENOL) 325 MG tablet Take 650 mg by mouth every 6 (six) hours as needed for mild pain or fever.   Yes [provider]  calcium carbonate (TUMS - DOSED IN MG ELEMENTAL CALCIUM) 500 MG chewable tablet Chew 1 tablet (200 mg of elemental calcium total) by mouth 3 (three) times daily as needed for indigestion or heartburn. 04/25/19  Yes Gouru, Illene Silver, MD  colchicine 0.6 MG tablet Take 1 tablet (0.6 mg total) by mouth 2 (two)  times daily. 04/05/19  Yes Gladstone Lighter, MD  dicyclomine (BENTYL) 10 MG capsule Take 10 mg by mouth 4 (four) times daily.    Yes [provider]  docusate sodium (COLACE) 100 MG capsule Take 1 capsule (100 mg total) by mouth daily as needed for mild constipation. 04/25/19  Yes Gouru, Illene Silver, MD  doxycycline (VIBRA-TABS) 100 MG tablet Take 100 mg by mouth 2 (two) times a day. For 17 days 05/08/19  Yes [provider]  DULoxetine (CYMBALTA) 30 MG capsule Take 1 capsule (30 mg total) by mouth daily. 04/05/19 07/04/19 Yes Gladstone Lighter, MD  gabapentin (NEURONTIN) 300 MG capsule Take 1 capsule (300 mg total) by mouth 3 (three) times daily. 04/05/19  Yes Gladstone Lighter, MD  insulin aspart (NOVOLOG) 100 UNIT/ML injection Inject 5 Units into the skin 3 (three) times daily with meals. 04/25/19  Yes Gouru, Aruna, MD  insulin glargine (LANTUS) 100 UNIT/ML injection Inject 0.15 mLs (15 Units total) into the skin daily. Patient taking differently: Inject 40 Units into the skin 2 (two) times daily.  05/17/19  Yes Epifanio Lesches, MD  ipratropium-albuterol (DUONEB) 0.5-2.5 (3) MG/3ML SOLN Take 3 mLs by nebulization every 6 (six) hours as needed. 04/25/19  Yes Gouru, Illene Silver, MD  metoCLOPramide (REGLAN) 10 MG tablet Take 1 tablet (10 mg total) by mouth 4 (four) times daily -  before meals and at bedtime. 05/22/19  Yes Sudini, Alveta Heimlich, MD  Multiple Vitamin (MULTIVITAMIN WITH MINERALS) TABS tablet Take 1 tablet by mouth daily. 04/26/19  Yes Gouru,  Illene Silver, MD  ondansetron (ZOFRAN) 4 MG tablet Take 1 tablet (4 mg total) by mouth every 6 (six) hours as needed for nausea. 05/22/19  Yes Sudini, Alveta Heimlich, MD  oxyCODONE (ROXICODONE) 5 MG immediate release tablet Take 2 tablets (10 mg total) by mouth every 8 (eight) hours as needed. 05/17/19 05/16/20 Yes Epifanio Lesches, MD  pantoprazole (PROTONIX) 40 MG tablet Take 40 mg by mouth 2 (two) times daily.    Yes [provider]  sucralfate (CARAFATE) 1 g  tablet Take 1 g by mouth 4 (four) times daily.    Yes [provider]  tamsulosin (FLOMAX) 0.4 MG CAPS capsule Take 1 capsule (0.4 mg total) by mouth daily. 04/05/19  Yes Gladstone Lighter, MD     Allergies Metformin and related and Motrin [ibuprofen]  Family History  Family history unknown: Yes    Social History Social History   Tobacco Use  . Smoking status: Former Research scientist (life sciences)  . Smokeless tobacco: Never Used  Substance Use Topics  . Alcohol use: Not Currently  . Drug use: Yes    Frequency: 0.5 times per week    Types: Marijuana, Cocaine    Review of Systems  Constitutional: No fever/chills Eyes: No visual changes.  ENT: No sore throat. Cardiovascular: Denies chest pain. Respiratory: Denies shortness of breath. Gastrointestinal: As above Genitourinary: Negative for dysuria. Musculoskeletal: Negative for back pain. Skin: Negative for rash. Neurological: Negative for headaches or weakness   ____________________________________________   PHYSICAL EXAM:  VITAL SIGNS: ED Triage Vitals  Enc Vitals Group     BP 05/24/19 1530 (!) 187/116     Pulse Rate 05/24/19 1530 (!) 105     Resp 05/24/19 1530 (!) 25     Temp --      Temp src --      SpO2 05/24/19 1530 99 %     Weight 05/24/19 1528 79.4 kg (175 lb)     Height 05/24/19 1528 1.803 m (5\' 11" )     Head Circumference --      Peak Flow --      Pain Score 05/24/19 1528 10     Pain Loc --      Pain Edu? --      Excl. in [place]? --     Constitutional: Alert and oriented. No acute distress. Pleasant and interactive  Nose: No congestion/rhinnorhea. Mouth/Throat: Mucous membranes are moist.   Neck:  Painless ROM Cardiovascular: Normal rate, regular rhythm. Grossly normal heart sounds.  Good peripheral circulation. Respiratory: Normal respiratory effort.  No retractions. Lungs CTAB. Gastrointestinal: Soft and nontender. No distention.  No CVA tenderness.  Mild epigastric tenderness  Musculoskeletal  Warm and  well perfused Neurologic:  Normal speech and language. No gross focal neurologic deficits are appreciated.  Skin:  Skin is warm, dry and intact. No rash noted. Psychiatric: Mood and affect are normal. Speech and behavior are normal.  ____________________________________________   LABS (all labs ordered are listed, but only abnormal results are displayed)  Labs Reviewed  CBC WITH DIFFERENTIAL/PLATELET - Abnormal; Notable for the following components:      Result Value   WBC 11.9 (*)    Hemoglobin 10.7 (*)    HCT 33.5 (*)    MCV 78.6 (*)    MCH 25.1 (*)    RDW 16.3 (*)    Neutro Abs 9.6 (*)    All other components within normal limits  COMPREHENSIVE METABOLIC PANEL - Abnormal; Notable for the following components:   Sodium 133 (*)  Chloride 97 (*)    CO2 20 (*)    Glucose, Bld 196 (*)    Total Protein 9.1 (*)    Anion gap 16 (*)    All other components within normal limits  GLUCOSE, CAPILLARY - Abnormal; Notable for the following components:   Glucose-Capillary 273 (*)    All other components within normal limits  LIPASE, BLOOD  TSH  BASIC METABOLIC PANEL  CBC  HEMOGLOBIN A1C   ____________________________________________  EKG  None ____________________________________________  RADIOLOGY  CT abdomen pelvis ____________________________________________   PROCEDURES  Procedure(s) performed: No  Procedures   Critical Care performed: No ____________________________________________   INITIAL IMPRESSION / ASSESSMENT AND PLAN / ED COURSE  Pertinent labs & imaging results that were available during my care of the patient were reviewed by me and considered in my medical decision making (see chart for details).  Patient presents with nausea vomiting upper abdominal pain, likely related to gastroparesis, no imaging performed during last admission but given that he has not had significant improvement will obtain CT abdomen pelvis, will treat with IV morphine, IV  Zofran.   Patient with minimal improvement, additional fluids and IV antiemetics given with little improvement, he will require admission for further management.  CT scan is unremarkable    ____________________________________________   FINAL CLINICAL IMPRESSION(S) / ED DIAGNOSES  Final diagnoses:  Intractable nausea and vomiting        Note:  This document was prepared using Dragon voice recognition software and may include unintentional dictation errors.   Lavonia Drafts, MD 05/24/19 2352

## 2019-05-25 LAB — CBC
HCT: 32.3 % — ABNORMAL LOW (ref 39.0–52.0)
Hemoglobin: 10.2 g/dL — ABNORMAL LOW (ref 13.0–17.0)
MCH: 25.8 pg — ABNORMAL LOW (ref 26.0–34.0)
MCHC: 31.6 g/dL (ref 30.0–36.0)
MCV: 81.6 fL (ref 80.0–100.0)
Platelets: 312 10*3/uL (ref 150–400)
RBC: 3.96 MIL/uL — ABNORMAL LOW (ref 4.22–5.81)
RDW: 16.8 % — ABNORMAL HIGH (ref 11.5–15.5)
WBC: 8.9 10*3/uL (ref 4.0–10.5)
nRBC: 0 % (ref 0.0–0.2)

## 2019-05-25 LAB — BASIC METABOLIC PANEL
Anion gap: 11 (ref 5–15)
BUN: 13 mg/dL (ref 6–20)
CO2: 23 mmol/L (ref 22–32)
Calcium: 8.8 mg/dL — ABNORMAL LOW (ref 8.9–10.3)
Chloride: 101 mmol/L (ref 98–111)
Creatinine, Ser: 0.74 mg/dL (ref 0.61–1.24)
GFR calc Af Amer: >60 mL/min (ref >60)
GFR calc non Af Amer: >60 mL/min (ref >60)
Glucose, Bld: 221 mg/dL — ABNORMAL HIGH (ref 70–99)
Potassium: 3.4 mmol/L — ABNORMAL LOW (ref 3.5–5.1)
Sodium: 135 mmol/L (ref 135–145)

## 2019-05-25 LAB — MAGNESIUM: Magnesium: 1.6 mg/dL — ABNORMAL LOW (ref 1.7–2.4)

## 2019-05-25 LAB — HEMOGLOBIN A1C
Hgb A1c MFr Bld: 8.4 % — ABNORMAL HIGH (ref 4.8–5.6)
Mean Plasma Glucose: 194.38 mg/dL

## 2019-05-25 LAB — GLUCOSE, CAPILLARY
Glucose-Capillary: 217 mg/dL — ABNORMAL HIGH (ref 70–99)
Glucose-Capillary: 315 mg/dL — ABNORMAL HIGH (ref 70–99)
Glucose-Capillary: 135 mg/dL — ABNORMAL HIGH (ref 70–99)
Glucose-Capillary: 224 mg/dL — ABNORMAL HIGH (ref 70–99)

## 2019-05-25 MED ORDER — HYDRALAZINE HCL 20 MG/ML IJ SOLN
10.0000 mg | Freq: Four times a day (QID) | INTRAMUSCULAR | Status: DC | PRN
  Administered 2019-05-25: 04:00:00 10 mg via INTRAVENOUS
  Filled 2019-05-25: qty 1

## 2019-05-25 MED ORDER — BOOST / RESOURCE BREEZE PO LIQD CUSTOM
1.0000 | Freq: Three times a day (TID) | ORAL | Status: DC
  Administered 2019-05-25 – 2019-05-26 (×3): 1 via ORAL

## 2019-05-25 MED ORDER — INSULIN GLARGINE 100 UNIT/ML ~~LOC~~ SOLN
20.0000 [IU] | Freq: Two times a day (BID) | SUBCUTANEOUS | Status: DC
  Administered 2019-05-25 – 2019-05-26 (×3): 20 [IU] via SUBCUTANEOUS
  Filled 2019-05-25 (×5): qty 0.2

## 2019-05-25 MED ORDER — POTASSIUM CHLORIDE CRYS ER 20 MEQ PO TBCR
40.0000 meq | EXTENDED_RELEASE_TABLET | Freq: Once | ORAL | Status: AC
  Administered 2019-05-25: 12:00:00 40 meq via ORAL
  Filled 2019-05-25: qty 2

## 2019-05-25 MED ORDER — MAGNESIUM SULFATE 2 GM/50ML IV SOLN
2.0000 g | Freq: Once | INTRAVENOUS | Status: AC
  Administered 2019-05-25: 2 g via INTRAVENOUS
  Filled 2019-05-25: qty 50

## 2019-05-25 NOTE — Progress Notes (Signed)
[place] [facility] [place] NAME: Margie Brink    MR#:  [id number]  DATE OF BIRTH:  1966/02/22  SUBJECTIVE:  CHIEF COMPLAINT:   Chief Complaint  Patient presents with  . Abdominal Pain   Patient still complains of abdominal pain, intermittent, 8 out of 10 without radiation.  He has nausea but no vomiting. REVIEW OF SYSTEMS:  Review of Systems  Constitutional: Positive for malaise/fatigue. Negative for chills and fever.  HENT: Negative for sore throat.   Eyes: Negative for blurred vision and double vision.  Respiratory: Negative for cough, hemoptysis, shortness of breath, wheezing and stridor.   Cardiovascular: Negative for chest pain, palpitations, orthopnea and leg swelling.  Gastrointestinal: Positive for abdominal pain and nausea. Negative for blood in stool, diarrhea, melena and vomiting.  Genitourinary: Negative for dysuria, flank pain and hematuria.  Musculoskeletal: Negative for back pain and joint pain.  Neurological: Negative for dizziness, sensory change, focal weakness, seizures, loss of consciousness, weakness and headaches.  Endo/Heme/Allergies: Negative for polydipsia.  Psychiatric/Behavioral: Negative for depression. The patient is not nervous/anxious.     DRUG ALLERGIES:   Allergies  Allergen Reactions  . Metformin And Related     Pt. States it makes his stomach bleed because of a stomach ulcer  . Motrin [Ibuprofen]     Pt. States it "inflames his stomach, causes bleeding in stomach"   VITALS:  Blood pressure (!) 148/108, pulse (!) 106, temperature 98.2 F (36.8 C), resp. rate 18, height 5\' 11"  (1.803 m), weight 79.4 kg, SpO2 99 %. PHYSICAL EXAMINATION:  Physical Exam Constitutional:      General: He is not in acute distress. HENT:     Head: Normocephalic.     Mouth/Throat:     Mouth: Mucous membranes are moist.  Eyes:     General: No scleral icterus.    Conjunctiva/sclera: Conjunctivae normal.     Pupils:  Pupils are equal, round, and reactive to light.  Neck:     Musculoskeletal: Normal range of motion and neck supple.     Vascular: No JVD.     Trachea: No tracheal deviation.  Cardiovascular:     Rate and Rhythm: Normal rate and regular rhythm.     Heart sounds: Normal heart sounds. No murmur. No gallop.   Pulmonary:     Effort: Pulmonary effort is normal. No respiratory distress.     Breath sounds: Normal breath sounds. No stridor. No wheezing, rhonchi or rales.  Abdominal:     General: Bowel sounds are normal. There is no distension.     Palpations: Abdomen is soft.     Tenderness: There is abdominal tenderness. There is no guarding or rebound.  Musculoskeletal: Normal range of motion.        General: No tenderness.  Skin:    Findings: No erythema or rash.  Neurological:     General: No focal deficit present.     Mental Status: He is alert and oriented to person, place, and time.     Cranial Nerves: No cranial nerve deficit.  Psychiatric:        Mood and Affect: Mood normal.    LABORATORY PANEL:  Male CBC Recent Labs  Lab 05/25/19 0531  WBC 8.9  HGB 10.2*  HCT 32.3*  PLT 312   ------------------------------------------------------------------------------------------------------------------ Chemistries  Recent Labs  Lab 05/24/19 1723 05/25/19 0531  NA 133* 135  K 3.5 3.4*  CL 97* 101  CO2 20* 23  GLUCOSE 196* 221*  BUN 15 13  CREATININE 0.93 0.74  CALCIUM 9.6 8.8*  MG  --  1.6*  AST 16  --   ALT 11  --   ALKPHOS 80  --   BILITOT 0.7  --    RADIOLOGY:  Ct Abdomen Pelvis W Contrast  Result Date: 05/24/2019 CLINICAL DATA:  Pancreatitis EXAM: CT ABDOMEN AND PELVIS WITH CONTRAST TECHNIQUE: Multidetector CT imaging of the abdomen and pelvis was performed using the standard protocol following bolus administration of intravenous contrast. CONTRAST:  1102mL OMNIPAQUE IOHEXOL 300 MG/ML  SOLN COMPARISON:  CT dated May 12, 2019 FINDINGS: Lower chest: The lung bases are  clear.The heart size is mildly enlarged. There is a trace pericardial effusion which has improved from prior study. Hepatobiliary: Again identified are surgical clips related to prior hepatic resection. Status post cholecystectomy.There is mild intrahepatic and extrahepatic biliary ductal dilatation. This appears similar across prior studies. Pancreas: Again identified is an area of decreased attenuation involving the distal pancreatic body/pancreatic tail. This is stable from prior studies. Spleen: The spleen is mildly enlarged. Adrenals/Urinary Tract: --Adrenal glands: No adrenal hemorrhage. --Right kidney/ureter: No hydronephrosis or perinephric hematoma. --Left kidney/ureter: No hydronephrosis or perinephric hematoma. --Urinary bladder: Unremarkable. Stomach/Bowel: --Stomach/Duodenum: No hiatal hernia or other gastric abnormality. Normal duodenal course and caliber. --Small bowel: No dilatation or inflammation. --Colon: No focal abnormality. --Appendix: Not visualized. No right lower quadrant inflammation or free fluid. Vascular/Lymphatic: Atherosclerotic calcification is present within the non-aneurysmal abdominal aorta, without hemodynamically significant stenosis. 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 The portal vein is patent. 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 --No retroperitoneal lymphadenopathy. --No mesenteric lymphadenopathy. --No pelvic or inguinal lymphadenopathy. Reproductive: Unremarkable Other: No ascites or free air. The abdominal wall is normal. Musculoskeletal. No acute displaced fractures. IMPRESSION: 1. No definite acute abnormality detected. 2. Stable appearance of the pancreatic tail with a persistent low-attenuation area of unknown clinical significance. 3. 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 4. Additional chronic findings as above. Electronically Signed   By: Constance Holster M.D.   On:  05/24/2019 17:39   ASSESSMENT AND PLAN:    1.  Gastroparesis - IV antiemetic Reglan every 6 hours. - Normal saline infusing to peripheral IV 100 cc/h - Pain is being managed with IV analgesic  2. 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  3. history of liver cancer - Reported by the patient to be in remission after being treated with chemotherapy in [place]  4.    Hyperglycemia and diabetes mellitus - Moderate sliding scale insulin Lantus 20 units twice daily.  Hypokalemia and hypomagnesemia.  Supplement.  All the records are reviewed and case discussed with Care Management/Social Worker. Management plans discussed with the patient, family and they are in agreement.  CODE STATUS: Full Code  TOTAL TIME TAKING CARE OF THIS PATIENT: 28 minutes.   More than 50% of the time was spent in counseling/coordination of care: YES  POSSIBLE D/C IN 2 DAYS, DEPENDING ON CLINICAL CONDITION.   Demetrios Loll M.D on 05/25/2019 at 2:16 PM  Between 7am to 6pm - Pager - [phone]  After 6pm go to [email or web address] - [facility]

## 2019-05-25 NOTE — Progress Notes (Signed)
Inpatient Diabetes Program Recommendations  AACE/ADA: New Consensus Statement on Inpatient Glycemic Control (2015)  Target Ranges:  Prepandial:   less than 140 mg/dL      Peak postprandial:   less than 180 mg/dL (1-2 hours)      Critically ill patients:  140 - 180 mg/dL   Lab Results  Component Value Date   GLUCAP 315 (H) 05/25/2019   HGBA1C 8.4 (H) 05/24/2019    Review of Glycemic Control Results for Kenneth Copeland, Kenneth Copeland (MRN [id number]) as of 05/25/2019 12:32  Ref. Range 05/22/2019 07:56 05/22/2019 12:02 05/24/2019 23:24 05/25/2019 08:25 05/25/2019 11:42  Glucose-Capillary Latest Ref Range: 70 - 99 mg/dL 357 (H) 331 (H) 273 (H) 217 (H) 315 (H)   Diabetes history: DM2 Outpatient Diabetes medications: Lantus 40 units bid + Novolog 5 units tid meal coverage Current orders for Inpatient glycemic control: Novolog 5 units tid meal coverage + Novolog moderate correction tid + hs 0-5 units  Inpatient Diabetes Program Recommendations:   Spoke with patient and clarified he has been taking Lantus 40 units bid @ home. -Add Lantus 20 units bid to current regimen  Thank you, Bethena Roys E. Neriyah Cercone, RN, MSN, CDE  Diabetes Coordinator Inpatient Glycemic Control Team Team Pager [phone] (8am-5pm) 05/25/2019 12:35 PM

## 2019-05-25 NOTE — TOC Initial Note (Signed)
Transition of Care [facility]) - Initial/Assessment Note    Patient Details  Name: Kenneth Copeland MRN: [id number] Date of Birth: September 27, 1966  [facility]) CM/SW Contact:    Shelbie Hutching, RN Phone Number: 05/25/2019, 1:15 PM  Clinical Narrative:                 Patient admitted with gastroparesis, recently discharged 6/25 for the same.  Patient lives with his girlfriend at the [facility].  Patient is independent in ADL's.  Patient did not need home health services when discharged last, will reassess this admission.  Expected Discharge Plan: Home/Self Care Barriers to Discharge: Continued Medical Work up   Patient Goals and CMS Choice Patient states their goals for this hospitalization and ongoing recovery are:: To get better      Expected Discharge Plan and Services Expected Discharge Plan: Home/Self Care       Living arrangements for the past 2 months: Hotel/Motel Expected Discharge Date: 05/28/19                                    Prior Living Arrangements/Services Living arrangements for the past 2 months: Hotel/Motel Lives with:: Significant Other Patient language and need for interpreter reviewed:: No Do you feel safe going back to the place where you live?: Yes      Need for Family Participation in Patient Care: Yes (Comment)(diabetes and gastroparesis) Care giver support system in place?: Yes (comment)(girlfriend)   Criminal Activity/Legal Involvement Pertinent to Current Situation/Hospitalization: No - Comment as needed  Activities of Daily Living Home Assistive Devices/Equipment: None ADL Screening (condition at time of admission) Patient's cognitive ability adequate to safely complete daily activities?: Yes Is the patient deaf or have difficulty hearing?: No Does the patient have difficulty seeing, even when wearing glasses/contacts?: No Does the patient have difficulty concentrating, remembering, or making decisions?: No Patient able to  express need for assistance with ADLs?: Yes Does the patient have difficulty dressing or bathing?: No Independently performs ADLs?: Yes (appropriate for developmental age) Does the patient have difficulty walking or climbing stairs?: No Weakness of Legs: None Weakness of Arms/Hands: None  Permission Sought/Granted                  Emotional Assessment Appearance:: Appears stated age Attitude/Demeanor/Rapport: Engaged Affect (typically observed): Accepting Orientation: : Oriented to Self, Oriented to Place, Oriented to  Time, Oriented to Situation Alcohol / Substance Use: Not Applicable Psych Involvement: No (comment)  Admission diagnosis:  Intractable nausea and vomiting [R11.2] Patient Active Problem List   Diagnosis Date Noted  . Intractable nausea and vomiting 05/21/2019  . Gastroparesis due to DM ([place]) 05/12/2019  . Acute respiratory failure ([place])   . Chest pain 04/08/2019  . Pericardial effusion 04/02/2019   PCP:  Patient, No Pcp Per Pharmacy:   [facility] [address], [place] - [place] [address] Phone: [phone] Fax: [phone]     Social Determinants of Health (SDOH) Interventions    Readmission Risk Interventions Readmission Risk Prevention Plan 04/12/2019  Transportation Screening Complete  Home Care Screening Complete  Medication Review (RN CM) Complete

## 2019-05-25 NOTE — Plan of Care (Signed)

## 2019-05-25 NOTE — Progress Notes (Addendum)
Initial Nutrition Assessment  DOCUMENTATION CODES:   Not applicable  INTERVENTION:   Boost Breeze po TID, each supplement provides 250 kcal and 9 grams of protein  MVI daily   Pt likely at moderate refeed risk; recommend monitor K, Mg and P labs daily until stable.   NUTRITION DIAGNOSIS:   Increased nutrient needs related to chronic illness(hepatic cancer) as evidenced by increased estimated needs.  GOAL:   Patient will meet greater than or equal to 90% of their needs  MONITOR:   PO intake, Supplement acceptance, Labs, Weight trends, Skin, I & O's, Diet advancement  REASON FOR ASSESSMENT:   Malnutrition Screening Tool    ASSESSMENT:   53 y.o. 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 CT abdomen showed a pancreatic tail lesion- concerning for adenocarcinoma VS necrosis. Pt present again for nausea, vomiting and abdominal pain  RD working remotely.  RD familiar with this patient from previous admit. Pt with intermittent poor appetite and oral intake over the past 2 weeks r/t nausea and vomiting. Pt was eating 100% of meal prior to discharge on 6/30. Pt presents again with nausea, abdominal pain and poor appetite. Pt currently eating 100% of his clear liquid diet. RD will add Boost Breeze as pt drinks Ensure clear at home and does not like milky supplements. Pt likely at refeed risk; recommend monitor electrolytes. Per chart, pt is fairly weight stable pta.   Medications reviewed and include: lovenox, insulin, reglan, protonix, MVI, carafate, NaCl @75ml /hr  Labs reviewed: K 3.4(L), Mg 1.6(L) Hgb 10.2(L), Hct 32.3(L) cbgs- 273, 217 x 24 hrs AIC 8.4(H)- 7/2  Unable to complete Nutrition-Focused physical  exam at this time.   Diet Order:   Diet Order            Diet clear liquid Room service appropriate? Yes; Fluid consistency: Thin  Diet effective now             EDUCATION NEEDS:   No education needs have been identified at this time  Skin:  Skin Assessment: Reviewed RN Assessment(ecchymosis)  Last BM:  7/1  Height:   Ht Readings from Last 1 Encounters:  05/24/19 5\' 11"  (1.803 m)    Weight:   Wt Readings from Last 1 Encounters:  05/24/19 79.4 kg    Ideal Body Weight:  78 kg  BMI:  Body mass index is 24.41 kg/m.  Estimated Nutritional Needs:   Kcal:  2100-2400kcal/day  Protein:  105-120g/day  Fluid:  >2.3L/day  Koleen Distance MS, RD, LDN Pager #- [phone] Office#- [phone] After Hours Pager: [phone]

## 2019-05-26 LAB — BASIC METABOLIC PANEL
Anion gap: 12 (ref 5–15)
BUN: 10 mg/dL (ref 6–20)
CO2: 19 mmol/L — ABNORMAL LOW (ref 22–32)
Calcium: 8.6 mg/dL — ABNORMAL LOW (ref 8.9–10.3)
Chloride: 100 mmol/L (ref 98–111)
Creatinine, Ser: 0.9 mg/dL (ref 0.61–1.24)
GFR calc Af Amer: >60 mL/min (ref >60)
GFR calc non Af Amer: >60 mL/min (ref >60)
Glucose, Bld: 388 mg/dL — ABNORMAL HIGH (ref 70–99)
Potassium: 3.6 mmol/L (ref 3.5–5.1)
Sodium: 131 mmol/L — ABNORMAL LOW (ref 135–145)

## 2019-05-26 LAB — MAGNESIUM: Magnesium: 1.7 mg/dL (ref 1.7–2.4)

## 2019-05-26 LAB — GLUCOSE, CAPILLARY
Glucose-Capillary: 350 mg/dL — ABNORMAL HIGH (ref 70–99)
Glucose-Capillary: 323 mg/dL — ABNORMAL HIGH (ref 70–99)

## 2019-05-26 MED ORDER — INSULIN GLARGINE 100 UNIT/ML ~~LOC~~ SOLN: 40.0000 [IU] | Freq: Two times a day (BID) | SUBCUTANEOUS | Status: AC

## 2019-05-26 MED ORDER — PANTOPRAZOLE SODIUM 40 MG PO TBEC
40.0000 mg | DELAYED_RELEASE_TABLET | Freq: Two times a day (BID) | ORAL | Status: DC
  Administered 2019-05-26: 40 mg via ORAL
  Filled 2019-05-26: qty 1

## 2019-05-26 MED ORDER — MAGNESIUM SULFATE 2 GM/50ML IV SOLN
2.0000 g | Freq: Once | INTRAVENOUS | Status: AC
  Administered 2019-05-26: 2 g via INTRAVENOUS
  Filled 2019-05-26: qty 50

## 2019-05-26 NOTE — Progress Notes (Signed)
Pt is d/ced back home.  Tolerating advanced diet well - has enormous appetite.  Removed IV.  Educated on reducing sugar intake for digestive and inflammatory reasons.  Gave medication only 1 time today.  Reviewed d/c instructions, medications and f/u appts.  Pt was issued a cab voucher by care management.

## 2019-05-26 NOTE — Plan of Care (Signed)
Pt. continues to request pain medication every 4 hrs and reports abdominal tenderness.  Pt. Was able to keep down night medications without complaints of nausea. Will continue to monitor.

## 2019-05-26 NOTE — Discharge Summary (Signed)
[facility] [place] NAME: Kenneth Copeland    MR#:  [id number]  DATE OF BIRTH:  1965/12/18  DATE OF ADMISSION:  05/24/2019   ADMITTING PHYSICIAN: Nicholes Mango, MD  DATE OF DISCHARGE: 05/26/2019  PRIMARY CARE PHYSICIAN: Patient, No Pcp Per   ADMISSION DIAGNOSIS:  Intractable nausea and vomiting [R11.2] DISCHARGE DIAGNOSIS:  Active Problems:   Gastroparesis due to DM ([place])  SECONDARY DIAGNOSIS:   Past Medical History:  Diagnosis Date  . Arthritis   . Cancer ([place])   . Diabetes mellitus without complication ([place])   . Hypertension   . Liver cancer [facility])    HOSPITAL COURSE:  1.Gastroparesis The patient has been treated withIV antiemetic Reglan every 6 hours. -Normal saline infusing to peripheral IV 100 cc/h -Pain is being managed with IV analgesic He feels much better, tolerated diet.  He wants to go home.  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  3.history of liver cancer -Reported by the patient to be in remission after being treated with chemotherapy in [place]  4.   Hyperglycemia and diabetes mellitus -Moderate sliding scale insulin Lantus 20 units twice daily.  Resume home dose after discharge.  Hypokalemia and hypomagnesemia.  Improved with Supplement.  DISCHARGE CONDITIONS:  Stable, discharge to home today. CONSULTS OBTAINED:   DRUG ALLERGIES:   Allergies  Allergen Reactions  . Metformin And Related     Pt. States it makes his stomach bleed because of a stomach ulcer  . Motrin [Ibuprofen]     Pt. States it "inflames his stomach, causes bleeding in stomach"   DISCHARGE MEDICATIONS:   Allergies as of 05/26/2019      Reactions   Metformin And Related    Pt. States it makes his  stomach bleed because of a stomach ulcer   Motrin [ibuprofen]    Pt. States it "inflames his stomach, causes bleeding in stomach"      Medication List    TAKE these medications   acetaminophen 325 MG tablet Commonly known as: TYLENOL Take 650 mg by mouth every 6 (six) hours as needed for mild pain or fever.   calcium carbonate 500 MG chewable tablet Commonly known as: TUMS - dosed in mg elemental calcium Chew 1 tablet (200 mg of elemental calcium total) by mouth 3 (three) times daily as needed for indigestion or heartburn.   colchicine 0.6 MG tablet Take 1 tablet (0.6 mg total) by mouth 2 (two) times daily.   dicyclomine 10 MG capsule Commonly known as: BENTYL Take 10 mg by mouth 4 (four) times daily.   docusate sodium 100 MG capsule Commonly known as: COLACE Take 1 capsule (100 mg total) by mouth daily as needed for mild constipation.   doxycycline 100 MG tablet Commonly known as: VIBRA-TABS Take 100 mg by mouth 2 (two) times a day. For 17 days   DULoxetine 30 MG capsule Commonly known as: Cymbalta Take 1 capsule (30 mg total) by mouth daily.   gabapentin 300 MG capsule Commonly known as: NEURONTIN Take 1 capsule (300 mg total) by mouth 3 (three) times daily.   insulin aspart 100 UNIT/ML injection Commonly known as: novoLOG Inject 5 Units into the skin 3 (three) times daily with meals.   insulin glargine 100 UNIT/ML injection Commonly known as: LANTUS Inject 0.4  mLs (40 Units total) into the skin 2 (two) times daily.   ipratropium-albuterol 0.5-2.5 (3) MG/3ML Soln Commonly known as: DUONEB Take 3 mLs by nebulization every 6 (six) hours as needed.   metoCLOPramide 10 MG tablet Commonly known as: REGLAN Take 1 tablet (10 mg total) by mouth 4 (four) times daily -  before meals and at bedtime.   multivitamin with minerals Tabs tablet Take 1 tablet by mouth daily.   ondansetron 4 MG tablet Commonly known as: ZOFRAN Take 1 tablet (4 mg total) by mouth every 6  (six) hours as needed for nausea.   oxyCODONE 5 MG immediate release tablet Commonly known as: Roxicodone Take 2 tablets (10 mg total) by mouth every 8 (eight) hours as needed.   pantoprazole 40 MG tablet Commonly known as: PROTONIX Take 40 mg by mouth 2 (two) times daily.   sucralfate 1 g tablet Commonly known as: CARAFATE Take 1 g by mouth 4 (four) times daily.   tamsulosin 0.4 MG Caps capsule Commonly known as: FLOMAX Take 1 capsule (0.4 mg total) by mouth daily.        DISCHARGE INSTRUCTIONS:  See AVS.  If you experience worsening of your admission symptoms, develop shortness of breath, life threatening emergency, suicidal or homicidal thoughts you must seek medical attention immediately by calling 911 or calling your MD immediately  if symptoms less severe.  You Must read complete instructions/literature along with all the possible adverse reactions/side effects for all the Medicines you take and that have been prescribed to you. Take any new Medicines after you have completely understood and accpet all the possible adverse reactions/side effects.   Please note  You were cared for by a hospitalist during your hospital stay. If you have any questions about your discharge medications or the care you received while you were in the hospital after you are discharged, you can call the unit and asked to speak with the hospitalist on call if the hospitalist that took care of you is not available. Once you are discharged, your primary care physician will handle any further medical issues. Please note that NO REFILLS for any discharge medications will be authorized once you are discharged, as it is imperative that you return to your primary care physician (or establish a relationship with a primary care physician if you do not have one) for your aftercare needs so that they can reassess your need for medications and monitor your lab values.    On the day of Discharge:  VITAL SIGNS:   Blood pressure (!) 139/107, pulse 92, temperature 97.9 F (36.6 C), resp. rate 16, height 5\' 11"  (1.803 m), weight 79.4 kg, SpO2 99 %. PHYSICAL EXAMINATION:  GENERAL:  53 y.o.-year-old patient lying in the bed with no acute distress.  EYES: Pupils equal, round, reactive to light and accommodation. No scleral icterus. Extraocular muscles intact.  HEENT: Head atraumatic, normocephalic. Oropharynx and nasopharynx clear.  NECK:  Supple, no jugular venous distention. No thyroid enlargement, no tenderness.  LUNGS: Normal breath sounds bilaterally, no wheezing, rales,rhonchi or crepitation. No use of accessory muscles of respiration.  CARDIOVASCULAR: S1, S2 normal. No murmurs, rubs, or gallops.  ABDOMEN: Soft, non-tender, non-distended. Bowel sounds present. No organomegaly or mass.  EXTREMITIES: No pedal edema, cyanosis, or clubbing.  NEUROLOGIC: Cranial nerves II through XII are intact. Muscle strength 5/5 in all extremities. Sensation intact. Gait not checked.  PSYCHIATRIC: The patient is alert and oriented x 3.  SKIN: No obvious rash, lesion, or ulcer.  DATA REVIEW:   CBC Recent Labs  Lab 05/25/19 0531  WBC 8.9  HGB 10.2*  HCT 32.3*  PLT 312    Chemistries  Recent Labs  Lab 05/24/19 1723  05/26/19 0556  NA 133*   < > 131*  K 3.5   < > 3.6  CL 97*   < > 100  CO2 20*   < > 19*  GLUCOSE 196*   < > 388*  BUN 15   < > 10  CREATININE 0.93   < > 0.90  CALCIUM 9.6   < > 8.6*  MG  --    < > 1.7  AST 16  --   --   ALT 11  --   --   ALKPHOS 80  --   --   BILITOT 0.7  --   --    < > = values in this interval not displayed.     Microbiology Results  Results for orders placed or performed during the hospital encounter of 05/21/19  SARS Coronavirus 2 (CEPHEID - Performed in [facility] hospital lab), Hosp Order     Status: None   Collection Time: 05/21/19  7:35 AM   Specimen: Nasopharyngeal Swab  Result Value Ref Range Status   SARS Coronavirus 2 NEGATIVE NEGATIVE Final     Comment: (NOTE) If result is NEGATIVE SARS-CoV-2 target nucleic acids are NOT DETECTED. The SARS-CoV-2 RNA is generally detectable in upper and lower  respiratory specimens during the acute phase of infection. The lowest  concentration of SARS-CoV-2 viral copies this assay can detect is 250  copies / mL. A negative result does not preclude SARS-CoV-2 infection  and should not be used as the sole basis for treatment or other  patient management decisions.  A negative result may occur with  improper specimen collection / handling, submission of specimen other  than nasopharyngeal swab, presence of viral mutation(s) within the  areas targeted by this assay, and inadequate number of viral copies  (<250 copies / mL). A negative result must be combined with clinical  observations, patient history, and epidemiological information. If result is POSITIVE SARS-CoV-2 target nucleic acids are DETECTED. The SARS-CoV-2 RNA is generally detectable in upper and lower  respiratory specimens dur ing the acute phase of infection.  Positive  results are indicative of active infection with SARS-CoV-2.  Clinical  correlation with patient history and other diagnostic information is  necessary to determine patient infection status.  Positive results do  not rule out bacterial infection or co-infection with other viruses. If result is PRESUMPTIVE POSTIVE SARS-CoV-2 nucleic acids MAY BE PRESENT.   A presumptive positive result was obtained on the submitted specimen  and confirmed on repeat testing.  While 2019 novel coronavirus  (SARS-CoV-2) nucleic acids may be present in the submitted sample  additional confirmatory testing may be necessary for epidemiological  and / or clinical management purposes  to differentiate between  SARS-CoV-2 and other Sarbecovirus currently known to infect humans.  If clinically indicated additional testing with an alternate test  methodology [phone]) is advised. The SARS-CoV-2  RNA is generally  detectable in upper and lower respiratory sp ecimens during the acute  phase of infection. The expected result is Negative. Fact Sheet for Patients:  StrictlyIdeas.no Fact Sheet for Healthcare Providers: BankingDealers.co.za This test is not yet approved or cleared by the [place] FDA and has been authorized for detection and/or diagnosis of SARS-CoV-2 by FDA under an Emergency Use Authorization (EUA).  This  EUA will remain in effect (meaning this test can be used) for the duration of the COVID-19 declaration under Section 564(b)(1) of the Act, 21 U.S.C. section 360bbb-3(b)(1), unless the authorization is terminated or revoked sooner. Performed at [facility], [address] [place], [place] [address]     RADIOLOGY:  No results found.   Management plans discussed with the patient, family and they are in agreement.  CODE STATUS: Full Code   TOTAL TIME TAKING CARE OF THIS PATIENT: 32 minutes.    Demetrios Loll M.D on 05/26/2019 at 11:05 AM  Between 7am to 6pm - Pager - [phone]  After 6pm go to [email or web address] - Proofreader  [facility]  Office  [phone]  CC: Primary care physician; Patient, No Pcp Per   Note: This dictation was prepared with Dragon dictation along with smaller phrase technology. Any transcriptional errors that result from this process are unintentional.

## 2019-05-28 ENCOUNTER — Emergency Department
Admission: EM | Admit: 2019-05-28 | Discharge: 2019-05-29 | Disposition: A | Discharge status: Home / Self Care | Payer: Medicare Other | Attending: Emergency Medicine | Admitting: Emergency Medicine

## 2019-05-28 ENCOUNTER — Other Ambulatory Visit: Payer: Self-pay

## 2019-05-28 ENCOUNTER — Encounter: Payer: Self-pay | Admitting: Emergency Medicine

## 2019-05-28 DIAGNOSIS — Z79899 Other long term (current) drug therapy: Secondary | ICD-10-CM | POA: Diagnosis not present

## 2019-05-28 DIAGNOSIS — F1410 Cocaine abuse, uncomplicated: Secondary | ICD-10-CM | POA: Diagnosis not present

## 2019-05-28 DIAGNOSIS — F1210 Cannabis abuse, uncomplicated: Secondary | ICD-10-CM | POA: Insufficient documentation

## 2019-05-28 DIAGNOSIS — Z87891 Personal history of nicotine dependence: Secondary | ICD-10-CM | POA: Diagnosis not present

## 2019-05-28 DIAGNOSIS — E119 Type 2 diabetes mellitus without complications: Secondary | ICD-10-CM | POA: Insufficient documentation

## 2019-05-28 DIAGNOSIS — I10 Essential (primary) hypertension: Secondary | ICD-10-CM | POA: Insufficient documentation

## 2019-05-28 DIAGNOSIS — R1010 Upper abdominal pain, unspecified: Secondary | ICD-10-CM | POA: Diagnosis present

## 2019-05-28 DIAGNOSIS — R112 Nausea with vomiting, unspecified: Secondary | ICD-10-CM | POA: Diagnosis not present

## 2019-05-28 DIAGNOSIS — Z8505 Personal history of malignant neoplasm of liver: Secondary | ICD-10-CM | POA: Diagnosis not present

## 2019-05-28 DIAGNOSIS — R1084 Generalized abdominal pain: Secondary | ICD-10-CM | POA: Diagnosis not present

## 2019-05-28 DIAGNOSIS — Z794 Long term (current) use of insulin: Secondary | ICD-10-CM | POA: Insufficient documentation

## 2019-05-28 NOTE — ED Triage Notes (Signed)
Patient arrives via EMS from home with L side abdominal pain, says he's been diagnosed with pancreatitis before and this feels similarly. Patient endorses nausea and vomiting

## 2019-05-29 LAB — URINALYSIS, COMPLETE (UACMP) WITH MICROSCOPIC
Bacteria, UA: NONE SEEN
Bilirubin Urine: NEGATIVE
Glucose, UA: >=500 mg/dL — AB
Hgb urine dipstick: NEGATIVE
Ketones, ur: NEGATIVE mg/dL
Leukocytes,Ua: NEGATIVE
Nitrite: NEGATIVE
Protein, ur: NEGATIVE mg/dL
Specific Gravity, Urine: 1.012 (ref 1.005–1.030)
Squamous Epithelial / HPF: NONE SEEN (ref 0–5)
pH: 8 (ref 5.0–8.0)

## 2019-05-29 LAB — COMPREHENSIVE METABOLIC PANEL
ALT: 11 U/L (ref 0–44)
AST: 22 U/L (ref 15–41)
Albumin: 4.6 g/dL (ref 3.5–5.0)
Alkaline Phosphatase: 85 U/L (ref 38–126)
Anion gap: 17 — ABNORMAL HIGH (ref 5–15)
BUN: 16 mg/dL (ref 6–20)
CO2: 20 mmol/L — ABNORMAL LOW (ref 22–32)
Calcium: 9.4 mg/dL (ref 8.9–10.3)
Chloride: 96 mmol/L — ABNORMAL LOW (ref 98–111)
Creatinine, Ser: 0.89 mg/dL (ref 0.61–1.24)
GFR calc Af Amer: >60 mL/min (ref >60)
GFR calc non Af Amer: >60 mL/min (ref >60)
Glucose, Bld: 377 mg/dL — ABNORMAL HIGH (ref 70–99)
Potassium: 4.7 mmol/L (ref 3.5–5.1)
Sodium: 133 mmol/L — ABNORMAL LOW (ref 135–145)
Total Bilirubin: 0.4 mg/dL (ref 0.3–1.2)
Total Protein: 8.7 g/dL — ABNORMAL HIGH (ref 6.5–8.1)

## 2019-05-29 LAB — CBC WITH DIFFERENTIAL/PLATELET
Abs Immature Granulocytes: 0.02 10*3/uL (ref 0.00–0.07)
Basophils Absolute: 0.1 10*3/uL (ref 0.0–0.1)
Basophils Relative: 1 %
Eosinophils Absolute: 0 10*3/uL (ref 0.0–0.5)
Eosinophils Relative: 0 %
HCT: 38.2 % — ABNORMAL LOW (ref 39.0–52.0)
Hemoglobin: 12 g/dL — ABNORMAL LOW (ref 13.0–17.0)
Immature Granulocytes: 0 %
Lymphocytes Relative: 16 %
Lymphs Abs: 1.9 10*3/uL (ref 0.7–4.0)
MCH: 25.2 pg — ABNORMAL LOW (ref 26.0–34.0)
MCHC: 31.4 g/dL (ref 30.0–36.0)
MCV: 80.3 fL (ref 80.0–100.0)
Monocytes Absolute: 0.8 10*3/uL (ref 0.1–1.0)
Monocytes Relative: 7 %
Neutro Abs: 8.9 10*3/uL — ABNORMAL HIGH (ref 1.7–7.7)
Neutrophils Relative %: 76 %
Platelets: 322 10*3/uL (ref 150–400)
RBC: 4.76 MIL/uL (ref 4.22–5.81)
RDW: 16.3 % — ABNORMAL HIGH (ref 11.5–15.5)
WBC: 11.6 10*3/uL — ABNORMAL HIGH (ref 4.0–10.5)
nRBC: 0 % (ref 0.0–0.2)

## 2019-05-29 LAB — LIPASE, BLOOD: Lipase: 35 U/L (ref 11–51)

## 2019-05-29 MED ORDER — ONDANSETRON 4 MG PO TBDP: 4.0000 mg | ORAL_TABLET | Freq: Once | ORAL | Status: DC

## 2019-05-29 MED ORDER — HALOPERIDOL LACTATE 5 MG/ML IJ SOLN
5.0000 mg | Freq: Once | INTRAMUSCULAR | Status: AC
  Administered 2019-05-29: 5 mg via INTRAVENOUS
  Filled 2019-05-29: qty 1

## 2019-05-29 MED ORDER — DICYCLOMINE HCL 10 MG PO CAPS
20.0000 mg | ORAL_CAPSULE | Freq: Once | ORAL | Status: AC
  Administered 2019-05-29: 02:00:00 20 mg via ORAL
  Filled 2019-05-29: qty 2

## 2019-05-29 MED ORDER — DICYCLOMINE HCL 20 MG PO TABS: 20.0000 mg | ORAL_TABLET | Freq: Three times a day (TID) | ORAL | 0 refills | Status: AC | PRN

## 2019-05-29 NOTE — ED Provider Notes (Signed)
[facility] Emergency Department Provider Note   ____________________________________________   I have reviewed the triage vital signs and the nursing notes.   HISTORY  Chief Complaint Abdominal Pain   History limited by: Not Limited   HPI Kenneth Dinovo is a 53 y.o. male who presents to the emergency department today because of concerns for abdominal pain, nausea and vomiting.  The patient states that the pain started yesterday.  He describes pain as being located in the upper abdomen.  It is severe.  He states it reminds him of when he has had pancreatitis in the past.  He has had multiple associated episodes of emesis.  He denies any fevers.  Records reviewed. Per medical record review patient has a history of multiple recent admissions for abdominal pain, nausea and vomiting. Gastroparesis.   Past Medical History:  Diagnosis Date  . Arthritis   . Cancer ([place])   . Diabetes mellitus without complication ([place])   . Hypertension   . Liver cancer [facility])     Patient Active Problem List   Diagnosis Date Noted  . Intractable nausea and vomiting 05/21/2019  . Gastroparesis due to DM ([place]) 05/12/2019  . Acute respiratory failure ([place])   . Chest pain 04/08/2019  . Pericardial effusion 04/02/2019    Past Surgical History:  Procedure Laterality Date  . HERNIA REPAIR    . LIVER LOBECTOMY    . TEE WITHOUT CARDIOVERSION N/A 04/11/2019   Procedure: TRANSESOPHAGEAL ECHOCARDIOGRAM (TEE);  Surgeon: Corey Skains, MD;  Location: [facility] ORS;  Service: Cardiovascular;  Laterality: N/A;    Prior to Admission medications   Medication Sig Start Date End Date Taking? Authorizing Provider  acetaminophen (TYLENOL) 325 MG tablet Take 650 mg by mouth every 6 (six) hours as needed for mild pain or fever.    [provider]  calcium carbonate (TUMS - DOSED IN MG ELEMENTAL CALCIUM) 500 MG chewable tablet Chew 1 tablet (200 mg of elemental calcium total) by mouth 3  (three) times daily as needed for indigestion or heartburn. 04/25/19   Nicholes Mango, MD  colchicine 0.6 MG tablet Take 1 tablet (0.6 mg total) by mouth 2 (two) times daily. 04/05/19   Gladstone Lighter, MD  dicyclomine (BENTYL) 10 MG capsule Take 10 mg by mouth 4 (four) times daily.     [provider]  docusate sodium (COLACE) 100 MG capsule Take 1 capsule (100 mg total) by mouth daily as needed for mild constipation. 04/25/19   Nicholes Mango, MD  doxycycline (VIBRA-TABS) 100 MG tablet Take 100 mg by mouth 2 (two) times a day. For 17 days 05/08/19   [provider]  DULoxetine (CYMBALTA) 30 MG capsule Take 1 capsule (30 mg total) by mouth daily. 04/05/19 07/04/19  Gladstone Lighter, MD  gabapentin (NEURONTIN) 300 MG capsule Take 1 capsule (300 mg total) by mouth 3 (three) times daily. 04/05/19   Gladstone Lighter, MD  insulin aspart (NOVOLOG) 100 UNIT/ML injection Inject 5 Units into the skin 3 (three) times daily with meals. 04/25/19   Gouru, Illene Silver, MD  insulin glargine (LANTUS) 100 UNIT/ML injection Inject 0.4 mLs (40 Units total) into the skin 2 (two) times daily. 05/26/19   Demetrios Loll, MD  ipratropium-albuterol (DUONEB) 0.5-2.5 (3) MG/3ML SOLN Take 3 mLs by nebulization every 6 (six) hours as needed. 04/25/19   Nicholes Mango, MD  metoCLOPramide (REGLAN) 10 MG tablet Take 1 tablet (10 mg total) by mouth 4 (four) times daily -  before meals and at bedtime.  05/22/19   Hillary Bow, MD  Multiple Vitamin (MULTIVITAMIN WITH MINERALS) TABS tablet Take 1 tablet by mouth daily. 04/26/19   Gouru, Illene Silver, MD  ondansetron (ZOFRAN) 4 MG tablet Take 1 tablet (4 mg total) by mouth every 6 (six) hours as needed for nausea. 05/22/19   Hillary Bow, MD  oxyCODONE (ROXICODONE) 5 MG immediate release tablet Take 2 tablets (10 mg total) by mouth every 8 (eight) hours as needed. 05/17/19 05/16/20  Epifanio Lesches, MD  pantoprazole (PROTONIX) 40 MG tablet Take 40 mg by mouth 2 (two) times daily.     [provider]  sucralfate (CARAFATE) 1 g tablet Take 1 g by mouth 4 (four) times daily.     [provider]  tamsulosin (FLOMAX) 0.4 MG CAPS capsule Take 1 capsule (0.4 mg total) by mouth daily. 04/05/19   Gladstone Lighter, MD    Allergies Metformin and related and Motrin [ibuprofen]  Family History  Family history unknown: Yes    Social History Social History   Tobacco Use  . Smoking status: Former Research scientist (life sciences)  . Smokeless tobacco: Never Used  Substance Use Topics  . Alcohol use: Not Currently  . Drug use: Yes    Frequency: 0.5 times per week    Types: Marijuana, Cocaine    Review of Systems Constitutional: No fever/chills Eyes: No visual changes. ENT: No sore throat. Cardiovascular: Denies chest pain. Respiratory: Denies shortness of breath. Gastrointestinal: Positive for abdominal pain, nausea and vomiting.  Genitourinary: Negative for dysuria. Musculoskeletal: Negative for back pain. Skin: Negative for rash. Neurological: Negative for headaches, focal weakness or numbness.  ____________________________________________   PHYSICAL EXAM:  VITAL SIGNS: ED Triage Vitals  Enc Vitals Group     BP 05/28/19 2357 (!) 190/126     Pulse Rate 05/28/19 2357 (!) 109     Resp 05/28/19 2357 (!) 28     Temp 05/28/19 2357 98.1 F (36.7 C)     Temp Source 05/28/19 2357 Oral     SpO2 05/28/19 2354 100 %     Weight 05/28/19 2358 170 lb (77.1 kg)     Height 05/28/19 2358 5\' 11"  (1.803 m)     Head Circumference --      Peak Flow --      Pain Score 05/28/19 2358 10   Constitutional: Alert and oriented.  Eyes: Conjunctivae are normal.  ENT      Head: Normocephalic and atraumatic.      Nose: No congestion/rhinnorhea.      Mouth/Throat: Mucous membranes are moist.      Neck: No stridor. Hematological/Lymphatic/Immunilogical: No cervical lymphadenopathy. Cardiovascular: Normal rate, regular rhythm.  No murmurs, rubs, or gallops.  Respiratory: Normal respiratory effort  without tachypnea nor retractions. Breath sounds are clear and equal bilaterally. No wheezes/rales/rhonchi. Gastrointestinal: Soft and minimally tender in the upper abdomen. Genitourinary: Deferred Musculoskeletal: Normal range of motion in all extremities. No lower extremity edema. Neurologic:  Normal speech and language. No gross focal neurologic deficits are appreciated.  Skin:  Skin is warm, dry and intact. No rash noted. Psychiatric: Mood and affect are normal. Speech and behavior are normal. Patient exhibits appropriate insight and judgment.  ____________________________________________    LABS (pertinent positives/negatives)  Lipase 35 UA clear, glucose >500, 0-5 rbc and wbc CMP na 133, glu 377, cr 0.89 CBC wbc 11.6, hgb 12.0, plt 322  ____________________________________________   EKG  None  ____________________________________________    RADIOLOGY  None  ____________________________________________   PROCEDURES  Procedures  ____________________________________________  INITIAL IMPRESSION / ASSESSMENT AND PLAN / ED COURSE  Pertinent labs & imaging results that were available during my care of the patient were reviewed by me and considered in my medical decision making (see chart for details).   Patient presented to the emergency department today because of concerns for abdominal pain, nausea and vomiting.  Patient has had recent multiple admissions for similar symptoms.  Patient's blood work today with a very mild leukocytosis.  Lipase was normal.  Patient was given IV fluids.  He did feel improvement after Bentyl.  Patient was able to go to sleep.  At this point do not feel patient necessitates repeat admission.  Did impress upon patient importance of following up with GI.   ____________________________________________   FINAL CLINICAL IMPRESSION(S) / ED DIAGNOSES  Final diagnoses:  Generalized abdominal pain  Nausea and vomiting, intractability of  vomiting not specified, unspecified vomiting type     Note: This dictation was prepared with Dragon dictation. Any transcriptional errors that result from this process are unintentional     Nance Pear, MD 05/29/19 [phone]

## 2019-05-29 NOTE — ED Notes (Signed)
Patient will not stop moving for this RN to get an accurate discharge blood pressure

## 2019-05-29 NOTE — ED Notes (Signed)
Patient ambulatory at discharge, waiting for cab. Understands discharge instructions, instructed to take Bentyl as prescribed (up to 4x daily) instead of just once daily as he had been

## 2019-05-29 NOTE — Discharge Instructions (Addendum)
Please seek medical attention for any high fevers, chest pain, shortness of breath, change in behavior, persistent vomiting, bloody stool or any other new or concerning symptoms.  

## 2019-05-29 NOTE — ED Notes (Signed)
Ladona Mow called for patient discharge

## 2019-06-10 DIAGNOSIS — I82890 Acute embolism and thrombosis of other specified veins: Secondary | ICD-10-CM | POA: Diagnosis present

## 2019-06-10 DIAGNOSIS — Z794 Long term (current) use of insulin: Secondary | ICD-10-CM

## 2019-06-10 DIAGNOSIS — K3184 Gastroparesis: Secondary | ICD-10-CM | POA: Diagnosis present

## 2019-06-10 DIAGNOSIS — I10 Essential (primary) hypertension: Secondary | ICD-10-CM | POA: Diagnosis present

## 2019-06-10 DIAGNOSIS — E1143 Type 2 diabetes mellitus with diabetic autonomic (poly)neuropathy: Secondary | ICD-10-CM | POA: Diagnosis present

## 2019-06-10 DIAGNOSIS — Z9221 Personal history of antineoplastic chemotherapy: Secondary | ICD-10-CM

## 2019-06-10 DIAGNOSIS — K274 Chronic or unspecified peptic ulcer, site unspecified, with hemorrhage: Secondary | ICD-10-CM | POA: Diagnosis not present

## 2019-06-10 DIAGNOSIS — Z8505 Personal history of malignant neoplasm of liver: Secondary | ICD-10-CM

## 2019-06-10 DIAGNOSIS — Z8719 Personal history of other diseases of the digestive system: Secondary | ICD-10-CM

## 2019-06-10 DIAGNOSIS — K449 Diaphragmatic hernia without obstruction or gangrene: Secondary | ICD-10-CM | POA: Diagnosis present

## 2019-06-10 DIAGNOSIS — G8929 Other chronic pain: Secondary | ICD-10-CM | POA: Diagnosis present

## 2019-06-10 DIAGNOSIS — Z87891 Personal history of nicotine dependence: Secondary | ICD-10-CM

## 2019-06-10 DIAGNOSIS — Z79899 Other long term (current) drug therapy: Secondary | ICD-10-CM

## 2019-06-10 DIAGNOSIS — Z79891 Long term (current) use of opiate analgesic: Secondary | ICD-10-CM

## 2019-06-10 DIAGNOSIS — I864 Gastric varices: Secondary | ICD-10-CM | POA: Diagnosis present

## 2019-06-10 DIAGNOSIS — Z888 Allergy status to other drugs, medicaments and biological substances status: Secondary | ICD-10-CM

## 2019-06-10 DIAGNOSIS — K2210 Ulcer of esophagus without bleeding: Secondary | ICD-10-CM | POA: Diagnosis present

## 2019-06-10 DIAGNOSIS — F1021 Alcohol dependence, in remission: Secondary | ICD-10-CM | POA: Diagnosis present

## 2019-06-10 DIAGNOSIS — Z20828 Contact with and (suspected) exposure to other viral communicable diseases: Secondary | ICD-10-CM | POA: Diagnosis present

## 2019-06-10 DIAGNOSIS — R109 Unspecified abdominal pain: Secondary | ICD-10-CM | POA: Diagnosis not present

## 2019-06-10 DIAGNOSIS — Z886 Allergy status to analgesic agent status: Secondary | ICD-10-CM

## 2019-06-10 LAB — COMPREHENSIVE METABOLIC PANEL
ALT: 12 U/L (ref 0–44)
AST: 25 U/L (ref 15–41)
Albumin: 4.8 g/dL (ref 3.5–5.0)
Alkaline Phosphatase: 72 U/L (ref 38–126)
Anion gap: 16 — ABNORMAL HIGH (ref 5–15)
BUN: 17 mg/dL (ref 6–20)
CO2: 21 mmol/L — ABNORMAL LOW (ref 22–32)
Calcium: 9.6 mg/dL (ref 8.9–10.3)
Chloride: 97 mmol/L — ABNORMAL LOW (ref 98–111)
Creatinine, Ser: 1.07 mg/dL (ref 0.61–1.24)
GFR calc Af Amer: >60 mL/min (ref >60)
GFR calc non Af Amer: >60 mL/min (ref >60)
Glucose, Bld: 194 mg/dL — ABNORMAL HIGH (ref 70–99)
Potassium: 3.3 mmol/L — ABNORMAL LOW (ref 3.5–5.1)
Sodium: 134 mmol/L — ABNORMAL LOW (ref 135–145)
Total Bilirubin: 0.6 mg/dL (ref 0.3–1.2)
Total Protein: 8.2 g/dL — ABNORMAL HIGH (ref 6.5–8.1)

## 2019-06-10 LAB — CBC
HCT: 36.1 % — ABNORMAL LOW (ref 39.0–52.0)
Hemoglobin: 11.8 g/dL — ABNORMAL LOW (ref 13.0–17.0)
MCH: 25.7 pg — ABNORMAL LOW (ref 26.0–34.0)
MCHC: 32.7 g/dL (ref 30.0–36.0)
MCV: 78.5 fL — ABNORMAL LOW (ref 80.0–100.0)
Platelets: 327 10*3/uL (ref 150–400)
RBC: 4.6 MIL/uL (ref 4.22–5.81)
RDW: 17 % — ABNORMAL HIGH (ref 11.5–15.5)
WBC: 9.7 10*3/uL (ref 4.0–10.5)
nRBC: 0 % (ref 0.0–0.2)

## 2019-06-10 LAB — LIPASE, BLOOD: Lipase: 29 U/L (ref 11–51)

## 2019-06-10 MED ORDER — FENTANYL CITRATE (PF) 100 MCG/2ML IJ SOLN
50.0000 ug | Freq: Once | INTRAMUSCULAR | Status: AC
  Administered 2019-06-10: 50 ug via INTRAVENOUS
  Filled 2019-06-10: qty 2

## 2019-06-10 MED ORDER — SODIUM CHLORIDE 0.9% FLUSH
3.0000 mL | Freq: Once | INTRAVENOUS | Status: AC
  Administered 2019-06-10: 22:00:00 3 mL via INTRAVENOUS

## 2019-06-10 NOTE — ED Triage Notes (Signed)
Patient reports abdominal pain that started with morning with vomiting.

## 2019-06-10 NOTE — ED Notes (Signed)
Patient to waiting room via wheelchair by EMS for abdominal pain and vomiting.  EMS reports patient has appointment with PMP tomorrow.  Saline loc to left antecub via 20 g angiocath, received  Zofran 4mg  IV, BP -184/100,  HR - 102, CBG 204, pulse oxi 98% RA, 98.6

## 2019-06-10 NOTE — ED Notes (Signed)
While sending blood work through tube system patient in triage 1 and this RN heard retching.  On return to room patient reports he vomited.  This RN also noted that patient's right index finger is damp and appears to have an abrasion on it.

## 2019-06-11 ENCOUNTER — Emergency Department: Payer: Medicare Other

## 2019-06-11 ENCOUNTER — Inpatient Hospital Stay
Admission: EM | Admit: 2019-06-11 | Discharge: 2019-06-14 | DRG: 378 | Disposition: A | Discharge status: Home / Self Care | Payer: Medicare Other | Attending: Internal Medicine | Admitting: Internal Medicine

## 2019-06-11 ENCOUNTER — Other Ambulatory Visit: Payer: Self-pay

## 2019-06-11 DIAGNOSIS — I82890 Acute embolism and thrombosis of other specified veins: Secondary | ICD-10-CM | POA: Diagnosis present

## 2019-06-11 DIAGNOSIS — I864 Gastric varices: Secondary | ICD-10-CM | POA: Diagnosis present

## 2019-06-11 DIAGNOSIS — Z8719 Personal history of other diseases of the digestive system: Secondary | ICD-10-CM | POA: Diagnosis not present

## 2019-06-11 DIAGNOSIS — Z8505 Personal history of malignant neoplasm of liver: Secondary | ICD-10-CM | POA: Diagnosis not present

## 2019-06-11 DIAGNOSIS — K920 Hematemesis: Secondary | ICD-10-CM | POA: Diagnosis not present

## 2019-06-11 DIAGNOSIS — K3184 Gastroparesis: Secondary | ICD-10-CM

## 2019-06-11 DIAGNOSIS — Z888 Allergy status to other drugs, medicaments and biological substances status: Secondary | ICD-10-CM | POA: Diagnosis not present

## 2019-06-11 DIAGNOSIS — Z79899 Other long term (current) drug therapy: Secondary | ICD-10-CM | POA: Diagnosis not present

## 2019-06-11 DIAGNOSIS — G8929 Other chronic pain: Secondary | ICD-10-CM | POA: Diagnosis present

## 2019-06-11 DIAGNOSIS — K449 Diaphragmatic hernia without obstruction or gangrene: Secondary | ICD-10-CM | POA: Diagnosis present

## 2019-06-11 DIAGNOSIS — I10 Essential (primary) hypertension: Secondary | ICD-10-CM | POA: Diagnosis present

## 2019-06-11 DIAGNOSIS — Z87891 Personal history of nicotine dependence: Secondary | ICD-10-CM | POA: Diagnosis not present

## 2019-06-11 DIAGNOSIS — Z9221 Personal history of antineoplastic chemotherapy: Secondary | ICD-10-CM | POA: Diagnosis not present

## 2019-06-11 DIAGNOSIS — Z886 Allergy status to analgesic agent status: Secondary | ICD-10-CM | POA: Diagnosis not present

## 2019-06-11 DIAGNOSIS — Z20828 Contact with and (suspected) exposure to other viral communicable diseases: Secondary | ICD-10-CM | POA: Diagnosis present

## 2019-06-11 DIAGNOSIS — K274 Chronic or unspecified peptic ulcer, site unspecified, with hemorrhage: Secondary | ICD-10-CM | POA: Diagnosis present

## 2019-06-11 DIAGNOSIS — Z79891 Long term (current) use of opiate analgesic: Secondary | ICD-10-CM | POA: Diagnosis not present

## 2019-06-11 DIAGNOSIS — K2210 Ulcer of esophagus without bleeding: Secondary | ICD-10-CM | POA: Diagnosis present

## 2019-06-11 DIAGNOSIS — F1021 Alcohol dependence, in remission: Secondary | ICD-10-CM | POA: Diagnosis present

## 2019-06-11 DIAGNOSIS — R109 Unspecified abdominal pain: Secondary | ICD-10-CM | POA: Diagnosis present

## 2019-06-11 DIAGNOSIS — R112 Nausea with vomiting, unspecified: Secondary | ICD-10-CM | POA: Diagnosis present

## 2019-06-11 DIAGNOSIS — Z794 Long term (current) use of insulin: Secondary | ICD-10-CM | POA: Diagnosis not present

## 2019-06-11 DIAGNOSIS — E1143 Type 2 diabetes mellitus with diabetic autonomic (poly)neuropathy: Secondary | ICD-10-CM | POA: Diagnosis present

## 2019-06-11 DIAGNOSIS — K922 Gastrointestinal hemorrhage, unspecified: Secondary | ICD-10-CM | POA: Diagnosis present

## 2019-06-11 LAB — CBC
HCT: 38.4 % — ABNORMAL LOW (ref 39.0–52.0)
Hemoglobin: 12.1 g/dL — ABNORMAL LOW (ref 13.0–17.0)
MCH: 25.5 pg — ABNORMAL LOW (ref 26.0–34.0)
MCHC: 31.5 g/dL (ref 30.0–36.0)
MCV: 81 fL (ref 80.0–100.0)
Platelets: 368 10*3/uL (ref 150–400)
RBC: 4.74 MIL/uL (ref 4.22–5.81)
RDW: 17.6 % — ABNORMAL HIGH (ref 11.5–15.5)
WBC: 22.8 10*3/uL — ABNORMAL HIGH (ref 4.0–10.5)
nRBC: 0 % (ref 0.0–0.2)

## 2019-06-11 LAB — URINALYSIS, COMPLETE (UACMP) WITH MICROSCOPIC
Bacteria, UA: NONE SEEN
Bilirubin Urine: NEGATIVE
Glucose, UA: >=500 mg/dL — AB
Hgb urine dipstick: NEGATIVE
Ketones, ur: 20 mg/dL — AB
Leukocytes,Ua: NEGATIVE
Nitrite: NEGATIVE
Protein, ur: 30 mg/dL — AB
Specific Gravity, Urine: 1.016 (ref 1.005–1.030)
Squamous Epithelial / HPF: NONE SEEN (ref 0–5)
pH: 8 (ref 5.0–8.0)

## 2019-06-11 LAB — BASIC METABOLIC PANEL
Anion gap: 12 (ref 5–15)
BUN: 14 mg/dL (ref 6–20)
CO2: 22 mmol/L (ref 22–32)
Calcium: 8.7 mg/dL — ABNORMAL LOW (ref 8.9–10.3)
Chloride: 102 mmol/L (ref 98–111)
Creatinine, Ser: 0.94 mg/dL (ref 0.61–1.24)
GFR calc Af Amer: >60 mL/min (ref >60)
GFR calc non Af Amer: >60 mL/min (ref >60)
Glucose, Bld: 331 mg/dL — ABNORMAL HIGH (ref 70–99)
Potassium: 3.4 mmol/L — ABNORMAL LOW (ref 3.5–5.1)
Sodium: 136 mmol/L (ref 135–145)

## 2019-06-11 LAB — GLUCOSE, CAPILLARY
Glucose-Capillary: 346 mg/dL — ABNORMAL HIGH (ref 70–99)
Glucose-Capillary: 208 mg/dL — ABNORMAL HIGH (ref 70–99)

## 2019-06-11 LAB — PROTIME-INR
INR: 1.1 (ref 0.8–1.2)
INR: 1 (ref 0.8–1.2)
Prothrombin Time: 14.2 seconds (ref 11.4–15.2)
Prothrombin Time: 13.4 seconds (ref 11.4–15.2)

## 2019-06-11 LAB — IRON AND TIBC
Iron: 61 ug/dL (ref 45–182)
Saturation Ratios: 15 % — ABNORMAL LOW (ref 17.9–39.5)
TIBC: 407 ug/dL (ref 250–450)
UIBC: 346 ug/dL

## 2019-06-11 LAB — SARS CORONAVIRUS 2 BY RT PCR (HOSPITAL ORDER, PERFORMED IN ~~LOC~~ HOSPITAL LAB): SARS Coronavirus 2: NEGATIVE

## 2019-06-11 LAB — TYPE AND SCREEN
ABO/RH(D): A POS
Antibody Screen: NEGATIVE

## 2019-06-11 LAB — HEMOGLOBIN AND HEMATOCRIT, BLOOD
HCT: 30.7 % — ABNORMAL LOW (ref 39.0–52.0)
Hemoglobin: 9.8 g/dL — ABNORMAL LOW (ref 13.0–17.0)

## 2019-06-11 LAB — FERRITIN: Ferritin: 41 ng/mL (ref 24–336)

## 2019-06-11 MED ORDER — MORPHINE SULFATE (PF) 4 MG/ML IV SOLN
4.0000 mg | Freq: Once | INTRAVENOUS | Status: AC
  Administered 2019-06-11: 06:00:00 4 mg via INTRAVENOUS

## 2019-06-11 MED ORDER — HALOPERIDOL LACTATE 5 MG/ML IJ SOLN
2.0000 mg | Freq: Once | INTRAMUSCULAR | Status: AC
  Administered 2019-06-11: 2 mg via INTRAVENOUS
  Filled 2019-06-11: qty 1

## 2019-06-11 MED ORDER — ONDANSETRON HCL 4 MG/2ML IJ SOLN
4.0000 mg | Freq: Four times a day (QID) | INTRAMUSCULAR | Status: DC | PRN
  Administered 2019-06-11 – 2019-06-13 (×3): 4 mg via INTRAVENOUS
  Filled 2019-06-11 (×2): qty 2

## 2019-06-11 MED ORDER — METOCLOPRAMIDE HCL 5 MG PO TABS
10.0000 mg | ORAL_TABLET | Freq: Three times a day (TID) | ORAL | Status: DC
  Administered 2019-06-11 – 2019-06-13 (×7): 10 mg via ORAL
  Filled 2019-06-11 (×9): qty 2

## 2019-06-11 MED ORDER — PANTOPRAZOLE SODIUM 40 MG IV SOLR: 40.0000 mg | Freq: Two times a day (BID) | INTRAVENOUS | Status: DC

## 2019-06-11 MED ORDER — SODIUM CHLORIDE 0.9% FLUSH
3.0000 mL | Freq: Two times a day (BID) | INTRAVENOUS | Status: DC
  Administered 2019-06-11 – 2019-06-14 (×5): 3 mL via INTRAVENOUS

## 2019-06-11 MED ORDER — MENTHOL 3 MG MT LOZG
1.0000 | LOZENGE | OROMUCOSAL | Status: DC | PRN
  Filled 2019-06-11: qty 9

## 2019-06-11 MED ORDER — SODIUM CHLORIDE 0.9 % IV SOLN
80.0000 mg | Freq: Once | INTRAVENOUS | Status: AC
  Administered 2019-06-11: 80 mg via INTRAVENOUS
  Filled 2019-06-11: qty 80

## 2019-06-11 MED ORDER — ADULT MULTIVITAMIN W/MINERALS CH
1.0000 | ORAL_TABLET | Freq: Every day | ORAL | Status: DC
  Administered 2019-06-13: 09:00:00 1 via ORAL
  Filled 2019-06-11 (×3): qty 1

## 2019-06-11 MED ORDER — ACETAMINOPHEN 325 MG PO TABS: 650.0000 mg | ORAL_TABLET | Freq: Four times a day (QID) | ORAL | Status: DC | PRN

## 2019-06-11 MED ORDER — HYDRALAZINE HCL 20 MG/ML IJ SOLN
INTRAMUSCULAR | Status: AC
  Administered 2019-06-11: 10 mg via INTRAVENOUS
  Filled 2019-06-11: qty 1

## 2019-06-11 MED ORDER — HYDRALAZINE HCL 20 MG/ML IJ SOLN
10.0000 mg | Freq: Once | INTRAMUSCULAR | Status: AC
  Administered 2019-06-11: 12:00:00 10 mg via INTRAVENOUS

## 2019-06-11 MED ORDER — DOCUSATE SODIUM 100 MG PO CAPS: 100.0000 mg | ORAL_CAPSULE | Freq: Every day | ORAL | Status: DC | PRN

## 2019-06-11 MED ORDER — TAMSULOSIN HCL 0.4 MG PO CAPS
0.4000 mg | ORAL_CAPSULE | Freq: Every day | ORAL | Status: DC
  Administered 2019-06-13 – 2019-06-14 (×2): 0.4 mg via ORAL
  Filled 2019-06-11 (×3): qty 1

## 2019-06-11 MED ORDER — SODIUM CHLORIDE 0.9 % IV BOLUS
1000.0000 mL | Freq: Once | INTRAVENOUS | Status: AC
  Administered 2019-06-11: 1000 mL via INTRAVENOUS

## 2019-06-11 MED ORDER — ONDANSETRON HCL 4 MG PO TABS: 4.0000 mg | ORAL_TABLET | Freq: Four times a day (QID) | ORAL | Status: DC | PRN

## 2019-06-11 MED ORDER — INSULIN ASPART 100 UNIT/ML ~~LOC~~ SOLN
0.0000 [IU] | Freq: Every day | SUBCUTANEOUS | Status: DC
  Administered 2019-06-11 – 2019-06-12 (×2): 2 [IU] via SUBCUTANEOUS
  Administered 2019-06-13: 3 [IU] via SUBCUTANEOUS
  Filled 2019-06-11 (×3): qty 1

## 2019-06-11 MED ORDER — ONDANSETRON HCL 4 MG/2ML IJ SOLN
INTRAMUSCULAR | Status: AC
  Filled 2019-06-11: qty 2

## 2019-06-11 MED ORDER — POLYETHYLENE GLYCOL 3350 17 G PO PACK: 17.0000 g | PACK | Freq: Every day | ORAL | Status: DC | PRN

## 2019-06-11 MED ORDER — SODIUM CHLORIDE 0.9 % IV SOLN
INTRAVENOUS | Status: DC
  Administered 2019-06-11 – 2019-06-13 (×4): via INTRAVENOUS

## 2019-06-11 MED ORDER — MORPHINE SULFATE (PF) 4 MG/ML IV SOLN
INTRAVENOUS | Status: AC
  Administered 2019-06-11: 4 mg via INTRAVENOUS
  Filled 2019-06-11: qty 1

## 2019-06-11 MED ORDER — SUCRALFATE 1 G PO TABS
1.0000 g | ORAL_TABLET | Freq: Four times a day (QID) | ORAL | Status: DC
  Administered 2019-06-11 – 2019-06-13 (×4): 1 g via ORAL
  Filled 2019-06-11 (×9): qty 1

## 2019-06-11 MED ORDER — COLCHICINE 0.6 MG PO TABS
0.6000 mg | ORAL_TABLET | Freq: Two times a day (BID) | ORAL | Status: DC
  Administered 2019-06-11 – 2019-06-13 (×4): 0.6 mg via ORAL
  Filled 2019-06-11 (×8): qty 1

## 2019-06-11 MED ORDER — INSULIN GLARGINE 100 UNIT/ML ~~LOC~~ SOLN
40.0000 [IU] | Freq: Two times a day (BID) | SUBCUTANEOUS | Status: DC
  Administered 2019-06-11 – 2019-06-12 (×3): 40 [IU] via SUBCUTANEOUS
  Filled 2019-06-11 (×4): qty 0.4

## 2019-06-11 MED ORDER — HALOPERIDOL LACTATE 5 MG/ML IJ SOLN: 2.0000 mg | Freq: Once | INTRAMUSCULAR | Status: DC

## 2019-06-11 MED ORDER — INSULIN ASPART 100 UNIT/ML ~~LOC~~ SOLN: 5.0000 [IU] | Freq: Three times a day (TID) | SUBCUTANEOUS | Status: DC

## 2019-06-11 MED ORDER — INSULIN ASPART 100 UNIT/ML ~~LOC~~ SOLN
0.0000 [IU] | Freq: Three times a day (TID) | SUBCUTANEOUS | Status: DC
  Administered 2019-06-11: 09:00:00 11 [IU] via SUBCUTANEOUS
  Administered 2019-06-12: 5 [IU] via SUBCUTANEOUS
  Administered 2019-06-13 (×2): 3 [IU] via SUBCUTANEOUS
  Administered 2019-06-13: 18:00:00 8 [IU] via SUBCUTANEOUS
  Administered 2019-06-14: 12:00:00 11 [IU] via SUBCUTANEOUS
  Administered 2019-06-14: 5 [IU] via SUBCUTANEOUS
  Filled 2019-06-11 (×6): qty 1

## 2019-06-11 MED ORDER — ONDANSETRON HCL 4 MG/2ML IJ SOLN
4.0000 mg | Freq: Once | INTRAMUSCULAR | Status: AC
  Administered 2019-06-11: 4 mg via INTRAVENOUS
  Filled 2019-06-11 (×2): qty 2

## 2019-06-11 MED ORDER — ACETAMINOPHEN 650 MG RE SUPP: 650.0000 mg | Freq: Four times a day (QID) | RECTAL | Status: DC | PRN

## 2019-06-11 MED ORDER — SODIUM CHLORIDE 0.9 % IV SOLN
8.0000 mg/h | INTRAVENOUS | Status: DC
  Administered 2019-06-11 – 2019-06-12 (×2): 8 mg/h via INTRAVENOUS
  Filled 2019-06-11 (×3): qty 80

## 2019-06-11 MED ORDER — OXYCODONE HCL 5 MG PO TABS
5.0000 mg | ORAL_TABLET | Freq: Three times a day (TID) | ORAL | Status: DC | PRN
  Administered 2019-06-11: 22:00:00 5 mg via ORAL
  Filled 2019-06-11 (×4): qty 1

## 2019-06-11 MED ORDER — DULOXETINE HCL 30 MG PO CPEP
30.0000 mg | ORAL_CAPSULE | Freq: Every day | ORAL | Status: DC
  Administered 2019-06-13 – 2019-06-14 (×2): 30 mg via ORAL
  Filled 2019-06-11 (×3): qty 1

## 2019-06-11 MED ORDER — MORPHINE SULFATE (PF) 4 MG/ML IV SOLN
4.0000 mg | Freq: Once | INTRAVENOUS | Status: AC
  Administered 2019-06-11: 4 mg via INTRAVENOUS
  Filled 2019-06-11: qty 1

## 2019-06-11 MED ORDER — PANTOPRAZOLE SODIUM 40 MG PO TBEC
40.0000 mg | DELAYED_RELEASE_TABLET | Freq: Two times a day (BID) | ORAL | Status: DC
  Administered 2019-06-11 (×2): 40 mg via ORAL
  Filled 2019-06-11 (×3): qty 1

## 2019-06-11 MED ORDER — GABAPENTIN 300 MG PO CAPS
300.0000 mg | ORAL_CAPSULE | Freq: Three times a day (TID) | ORAL | Status: DC
  Administered 2019-06-11 – 2019-06-14 (×7): 300 mg via ORAL
  Filled 2019-06-11 (×8): qty 1

## 2019-06-11 MED ORDER — IPRATROPIUM-ALBUTEROL 0.5-2.5 (3) MG/3ML IN SOLN: 3.0000 mL | Freq: Four times a day (QID) | RESPIRATORY_TRACT | Status: DC | PRN

## 2019-06-11 MED ORDER — CALCIUM CARBONATE ANTACID 500 MG PO CHEW: 1.0000 | CHEWABLE_TABLET | Freq: Three times a day (TID) | ORAL | Status: DC | PRN

## 2019-06-11 NOTE — Progress Notes (Signed)
Inpatient Diabetes Program Recommendations  AACE/ADA: New Consensus Statement on Inpatient Glycemic Control (2015)  Target Ranges:  Prepandial:   less than 140 mg/dL      Peak postprandial:   less than 180 mg/dL (1-2 hours)      Critically ill patients:  140 - 180 mg/dL   Lab Results  Component Value Date   GLUCAP 346 (H) 06/11/2019   HGBA1C 8.4 (H) 05/24/2019    Review of Glycemic Control Results for Kenneth Copeland, Kenneth Copeland (MRN [id number]) as of 06/11/2019 10:56  Ref. Range 06/11/2019 07:30  Glucose-Capillary Latest Ref Range: 70 - 99 mg/dL 346 (H)   Diabetes history: DM 2 Outpatient Diabetes medications: Lantus 40 units bid, Novolog 5 units tid with meals  Inpatient Diabetes Program Recommendations:    Consider reducing Lantus to 20 units bid and change Novolog correction to sensitive q 4 hours since patient is NPO.  Stop Novolog meal coverage until diet restarted.   Thanks,  Adah Perl, RN, BC-ADM Inpatient Diabetes Coordinator Pager [phone] (8a-5p)

## 2019-06-11 NOTE — H&P (Signed)
[facility] [place] NAME: Terell Kincy    MR#:  [id number]  DATE OF BIRTH:  1966/09/16  DATE OF ADMISSION:  06/11/2019  PRIMARY CARE PHYSICIAN: Patient, No Pcp Per   REQUESTING/REFERRING PHYSICIAN:[facility] Alfred Levins, MD  CHIEF COMPLAINT:   Chief Complaint  Patient presents with  . Abdominal Pain    HISTORY OF PRESENT ILLNESS:  Riese Hellard  is a 53 y.o. male with a known history of diabetes, gastroparesis, hypertension, liver cancer status post partial hepatectomy, pancreatitis, and GI bleed.  He presents to the emergency room complaining of increased epigastric and right upper quadrant abdominal pain described as severe burning which has become worse over the last 12 to 24 hours.  Patient has had repeated admissions for GI bleed and similar symptoms.  He began having coffee-ground emesis in the a.m. on 06/10/2019.  He denies having hematochezia or melena.  He denies chest pain or shortness of breath.  Patient has a history of EtOH abuse however denies EtOH over the last 2 years.  He was treated in [place] for history of liver cancer.  His most recent endoscopy procedure was October 2019 at [facility] demonstrating no evidence of varices.  Labs on arrival to the emergency room with hemoglobin 11.8 and hematocrit 36.1.  Patient is tachycardic with heart rate 125.  Abdominal x-ray was completed demonstrating normal bowel gas pattern with moderate stool in the right colon with no obstruction or free air.  No acute chest findings seen.  He has been admitted to the hospitalist service for further management.  He was started on Protonix infusion in the emergency room.  We have consulted gastroenterology for further evaluation as well. PAST MEDICAL HISTORY:   Past Medical History:  Diagnosis Date  . Arthritis   . Cancer ([place])   . Diabetes mellitus without complication ([place])   . Hypertension   . Liver cancer ([place])     PAST SURGICAL  HISTORY:   Past Surgical History:  Procedure Laterality Date  . HERNIA REPAIR    . LIVER LOBECTOMY    . TEE WITHOUT CARDIOVERSION N/A 04/11/2019   Procedure: TRANSESOPHAGEAL ECHOCARDIOGRAM (TEE);  Surgeon: Corey Skains, MD;  Location: [facility] ORS;  Service: Cardiovascular;  Laterality: N/A;    SOCIAL HISTORY:   Social History   Tobacco Use  . Smoking status: Former Research scientist (life sciences)  . Smokeless tobacco: Never Used  Substance Use Topics  . Alcohol use: Not Currently    FAMILY HISTORY:   Family History  Family history unknown: Yes    DRUG ALLERGIES:   Allergies  Allergen Reactions  . Metformin And Related     Pt. States it makes his stomach bleed because of a stomach ulcer  . Motrin [Ibuprofen]     Pt. States it "inflames his stomach, causes bleeding in stomach"    REVIEW OF SYSTEMS:   Review of Systems  Constitutional: Positive for malaise/fatigue. Negative for chills, diaphoresis, fever and weight loss.  HENT: Negative for congestion, nosebleeds, sinus pain and sore throat.   Eyes: Negative for blurred vision and double vision.  Respiratory: Negative for cough, hemoptysis, sputum production, shortness of breath and wheezing.   Cardiovascular: Negative for chest pain and leg swelling.  Gastrointestinal: Positive for abdominal pain, nausea and vomiting (coffee ground emesis). Negative for blood in stool, constipation, diarrhea, heartburn and melena.  Genitourinary: Negative for dysuria, flank pain, hematuria and urgency.  Musculoskeletal: Negative for back pain, falls, joint pain and myalgias.  Skin: Negative.  Negative for itching and rash.  Neurological: Negative for dizziness, tingling, loss of consciousness, weakness and headaches.  Psychiatric/Behavioral: Negative.  Negative for depression.     MEDICATIONS AT HOME:   Prior to Admission medications   Medication Sig Start Date End Date Taking? Authorizing Provider  acetaminophen (TYLENOL) 325 MG tablet Take 650 mg by  mouth every 6 (six) hours as needed for mild pain or fever.   Yes [provider]  colchicine 0.6 MG tablet Take 1 tablet (0.6 mg total) by mouth 2 (two) times daily. 04/05/19  Yes Gladstone Lighter, MD  dicyclomine (BENTYL) 10 MG capsule Take 10 mg by mouth 4 (four) times daily.    Yes [provider]  dicyclomine (BENTYL) 20 MG tablet Take 1 tablet (20 mg total) by mouth 3 (three) times daily as needed. 05/29/19  Yes Nance Pear, MD  docusate sodium (COLACE) 100 MG capsule Take 1 capsule (100 mg total) by mouth daily as needed for mild constipation. 04/25/19  Yes Gouru, Illene Silver, MD  DULoxetine (CYMBALTA) 30 MG capsule Take 1 capsule (30 mg total) by mouth daily. 04/05/19 07/04/19 Yes Gladstone Lighter, MD  gabapentin (NEURONTIN) 300 MG capsule Take 1 capsule (300 mg total) by mouth 3 (three) times daily. 04/05/19  Yes Gladstone Lighter, MD  insulin aspart (NOVOLOG) 100 UNIT/ML injection Inject 5 Units into the skin 3 (three) times daily with meals. 04/25/19  Yes Gouru, Aruna, MD  insulin glargine (LANTUS) 100 UNIT/ML injection Inject 0.4 mLs (40 Units total) into the skin 2 (two) times daily. 05/26/19  Yes Demetrios Loll, MD  ipratropium-albuterol (DUONEB) 0.5-2.5 (3) MG/3ML SOLN Take 3 mLs by nebulization every 6 (six) hours as needed. 04/25/19  Yes Gouru, Illene Silver, MD  metoCLOPramide (REGLAN) 10 MG tablet Take 1 tablet (10 mg total) by mouth 4 (four) times daily -  before meals and at bedtime. 05/22/19  Yes Sudini, Alveta Heimlich, MD  Multiple Vitamin (MULTIVITAMIN WITH MINERALS) TABS tablet Take 1 tablet by mouth daily. 04/26/19  Yes Gouru, Aruna, MD  ondansetron (ZOFRAN) 4 MG tablet Take 1 tablet (4 mg total) by mouth every 6 (six) hours as needed for nausea. 05/22/19  Yes Sudini, Alveta Heimlich, MD  oxyCODONE (ROXICODONE) 5 MG immediate release tablet Take 2 tablets (10 mg total) by mouth every 8 (eight) hours as needed. 05/17/19 05/16/20 Yes Epifanio Lesches, MD  pantoprazole (PROTONIX) 40 MG tablet Take 40  mg by mouth 2 (two) times daily.    Yes [provider]  sucralfate (CARAFATE) 1 g tablet Take 1 g by mouth 4 (four) times daily.    Yes [provider]  tamsulosin (FLOMAX) 0.4 MG CAPS capsule Take 1 capsule (0.4 mg total) by mouth daily. 04/05/19  Yes Gladstone Lighter, MD  calcium carbonate (TUMS - DOSED IN MG ELEMENTAL CALCIUM) 500 MG chewable tablet Chew 1 tablet (200 mg of elemental calcium total) by mouth 3 (three) times daily as needed for indigestion or heartburn. 04/25/19   Nicholes Mango, MD  doxycycline (VIBRA-TABS) 100 MG tablet Take 100 mg by mouth 2 (two) times a day. For 17 days 05/08/19   [provider]      VITAL SIGNS:  Blood pressure (!) 196/155, pulse (!) 122, temperature 98.7 F (37.1 C), temperature source Oral, resp. rate (!) 31, height 5\' 11"  (1.803 m), weight 78.9 kg, SpO2 100 %.  PHYSICAL EXAMINATION:  Physical Exam  GENERAL:  53 y.o.-year-old patient lying in the bed with no acute distress.  EYES: Pupils equal,  round, reactive to light and accommodation. No scleral icterus. Extraocular muscles intact.  HEENT: Head atraumatic, normocephalic. Oropharynx and nasopharynx clear.  NECK:  Supple, no jugular venous distention. No thyroid enlargement, no tenderness.  LUNGS: Normal breath sounds bilaterally, no wheezing, rales,rhonchi or crepitation. No use of accessory muscles of respiration.  CARDIOVASCULAR: Regular rate and rhythm, S1, S2 normal. No murmurs, rubs, or gallops.  ABDOMEN: Soft, nondistended,RUQ and epigastric tenderness. Bowel sounds present. No organomegaly or mass.  EXTREMITIES: No pedal edema, cyanosis, or clubbing.  NEUROLOGIC: Cranial nerves II through XII are intact. Muscle strength 5/5 in all extremities. Sensation intact. Gait not checked.  PSYCHIATRIC: The patient is alert and oriented x 3.  Normal affect and good eye contact. SKIN: No obvious rash, lesion, or ulcer.   LABORATORY PANEL:   CBC Recent Labs  Lab  06/10/19 2136  WBC 9.7  HGB 11.8*  HCT 36.1*  PLT 327   ------------------------------------------------------------------------------------------------------------------  Chemistries  Recent Labs  Lab 06/10/19 2136  NA 134*  K 3.3*  CL 97*  CO2 21*  GLUCOSE 194*  BUN 17  CREATININE 1.07  CALCIUM 9.6  AST 25  ALT 12  ALKPHOS 72  BILITOT 0.6   ------------------------------------------------------------------------------------------------------------------  Cardiac Enzymes No results for input(s): TROPONINI in the last 168 hours. ------------------------------------------------------------------------------------------------------------------  RADIOLOGY:  Dg Abdomen Acute W/chest  Result Date: 06/11/2019 CLINICAL DATA:  Abdominal pain. Hematemesis. EXAM: DG ABDOMEN ACUTE W/ 1V CHEST COMPARISON:  Abdominal CT 05/24/2019, abdominal radiograph 05/15/2019 FINDINGS: Heart is normal in size. Normal mediastinal contours. No acute airspace disease. Minimal subsegmental atelectasis left lung base. No pleural fluid. No evidence of pneumomediastinum. No bowel dilatation to suggest obstruction. No free intra-abdominal air. Moderate stool in the right colon, small volume of stool distally. Surgical clips in the right upper quadrant from cholecystectomy and liver section. No radiopaque calculi. No acute osseous abnormalities. IMPRESSION: 1. Normal bowel gas pattern. Moderate stool in the right colon. No obstruction or free air. 2. No acute chest findings. Electronically Signed   By: Keith Rake M.D.   On: 06/11/2019 03:40      IMPRESSION AND PLAN:   1. GI bleed - IV Protonix infusion - Continue hemoglobin hematocrit every 6 hours and transfuse if necessary - Dr. Bonna Gains, gastroenterology, consulted for further evaluation and recommendations - Normal saline infusing to peripheral IV at 75 cc/h  2. gastroparesis - IV antiemetic - IV analgesic - Normal saline infusing to  peripheral IV at 5 cc/h - Reglan continued  3.  Diabetes mellitus - Moderate sliding scale insulin - We will continue scheduled Lantus insulin when patient is tolerating p.o. well  4.  History of liver cancer - Patient reports to be in remission having been treated with chemotherapy in [place] prior to relocation  DVT prophylaxis with SCDs and PPI prophylaxis initiated    All the records are reviewed and case discussed with ED provider. The plan of care was discussed in details with the patient (and family). I answered all questions. The patient agreed to proceed with the above mentioned plan. Further management will depend upon hospital course.   CODE STATUS: Full code  TOTAL TIME TAKING CARE OF THIS PATIENT:45 minutes.    [place] 06/11/2019 at 3:55 AM  Pager - [phone]  After 6pm go to [email or web address] - Proofreader  [facility]  Office  [phone]  CC: Primary care physician; Patient, No Pcp Per   Note: This dictation was prepared with Dragon dictation  along with smaller phrase technology. Any transcriptional errors that result from this process are unintentional.

## 2019-06-11 NOTE — ED Notes (Signed)
O2 sat of 48% noted on telemetry with good pleth wave form. Pt did not appear to be in respiratory distress. Pt placed on 2L via nasal cannula and O2 sat improved to 99%.

## 2019-06-11 NOTE — Progress Notes (Signed)
Patient refuses telemetry to be placed and continues to drink water out of the sink despite being NPO and being educated how this is exacerbating his nausea and vomiting. MD aware

## 2019-06-11 NOTE — Progress Notes (Signed)
[name], male   DOB: [birth year], 53 y.o.   MRN: [id number]  Patient came in with intractable nausea and vomiting. He had hematemesis. Currently having some abdominal pain. He is placed back on his oral pain meds. G.I. plans on EGD tomorrow. Labs look appropriate. White count is 22,000-- could be reactive. Will repeat labs tomorrow.

## 2019-06-11 NOTE — ED Notes (Signed)
This RN walking past subwait where pt is seated, gagging sound noted and pt seen to have entire index finger down his throat, repeatedly attempting to induce vomiting. Pt stops when he sees me watching him and states, " I have to do this, it's stuck and won't come up." pt states,  "blood is coming up now" pt told that he has to stop and that what he is doing is not good.

## 2019-06-11 NOTE — Progress Notes (Signed)
Per MD [place], sips with meds ok.  Patient tolerated oral medication well. Stacie Glaze, RN

## 2019-06-11 NOTE — ED Notes (Signed)
Pt reports that he is no longer vomiting blood; asks for ice chips. md notified.

## 2019-06-11 NOTE — Consult Note (Signed)
Kenneth Copeland , MD [address], [place], [place], [place], [address] [address], [place], [place], [place], [address] Phone: [phone]  Fax: [phone]  Consultation  Referring Provider:    Dr Posey Pronto  Primary Care Physician:  Patient, No Pcp Per Primary Gastroenterologist:  None          Reason for Consultation:     Hematemesis  Date of Admission:  06/11/2019 Date of Consultation:  06/11/2019         HPI:   Kenneth Copeland is a 53 y.o. male with a past medical history of diabetic gastroparesis, liver cancer status post resection.  Prior GI evaluation at [facility] in [place].  Last EGD in October 2019 which demonstrated severe erosive esophagitis at the GE junction.  Fluid-filled contents in the stomach.  Normal duodenum.  On arrival at the ER hemoglobin was 11.8 g and was tachycardic.  MCV low at 78.5.  His hemoglobin 2 weeks back was 10.2 g.  No elevation in the BUN and creatinine ratio.  Glucose was elevated on admission at 331.  INR 1.1.  COVID test negative.  Transaminases and total bilirubin were normal on admission.  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  Abnormal appearance of the pancreatic tail with persistent low-attenuation area of unknown clinical significance.  Denies any alcohol intake. No further hematemesis since coming in , thrown up some food and clear liqyuid. Main issue he states is chronic abdominal pain.     Past Medical History:  Diagnosis Date  . Arthritis   . Cancer ([place])   . Diabetes mellitus without complication ([place])   . Hypertension   . Liver cancer [facility])     Past Surgical History:  Procedure Laterality Date  . HERNIA REPAIR    . LIVER LOBECTOMY    . TEE WITHOUT CARDIOVERSION N/A 04/11/2019   Procedure: TRANSESOPHAGEAL ECHOCARDIOGRAM (TEE);  Surgeon: Corey Skains, MD;  Location: [facility] ORS;  Service: Cardiovascular;  Laterality: N/A;    Prior to Admission  medications   Medication Sig Start Date End Date Taking? Authorizing Provider  acetaminophen (TYLENOL) 325 MG tablet Take 650 mg by mouth every 6 (six) hours as needed for mild pain or fever.   Yes [provider]  colchicine 0.6 MG tablet Take 1 tablet (0.6 mg total) by mouth 2 (two) times daily. 04/05/19  Yes Gladstone Lighter, MD  dicyclomine (BENTYL) 10 MG capsule Take 10 mg by mouth 4 (four) times daily.    Yes [provider]  dicyclomine (BENTYL) 20 MG tablet Take 1 tablet (20 mg total) by mouth 3 (three) times daily as needed. 05/29/19  Yes Nance Pear, MD  docusate sodium (COLACE) 100 MG capsule Take 1 capsule (100 mg total) by mouth daily as needed for mild constipation. 04/25/19  Yes Gouru, Illene Silver, MD  DULoxetine (CYMBALTA) 30 MG capsule Take 1 capsule (30 mg total) by mouth daily. 04/05/19 07/04/19 Yes Gladstone Lighter, MD  gabapentin (NEURONTIN) 300 MG capsule Take 1 capsule (300 mg total) by mouth 3 (three) times daily. 04/05/19  Yes Gladstone Lighter, MD  insulin aspart (NOVOLOG) 100 UNIT/ML injection Inject 5 Units into the skin 3 (three) times daily with meals. 04/25/19  Yes Gouru, Aruna, MD  insulin glargine (LANTUS) 100 UNIT/ML injection Inject 0.4 mLs (40 Units total) into the skin 2 (two) times daily. 05/26/19  Yes Demetrios Loll, MD  ipratropium-albuterol (DUONEB) 0.5-2.5 (3) MG/3ML SOLN  Take 3 mLs by nebulization every 6 (six) hours as needed. 04/25/19  Yes Gouru, Illene Silver, MD  metoCLOPramide (REGLAN) 10 MG tablet Take 1 tablet (10 mg total) by mouth 4 (four) times daily -  before meals and at bedtime. 05/22/19  Yes Sudini, Alveta Heimlich, MD  Multiple Vitamin (MULTIVITAMIN WITH MINERALS) TABS tablet Take 1 tablet by mouth daily. 04/26/19  Yes Gouru, Aruna, MD  ondansetron (ZOFRAN) 4 MG tablet Take 1 tablet (4 mg total) by mouth every 6 (six) hours as needed for nausea. 05/22/19  Yes Sudini, Alveta Heimlich, MD  oxyCODONE (ROXICODONE) 5 MG immediate release tablet Take 2 tablets (10 mg total)  by mouth every 8 (eight) hours as needed. 05/17/19 05/16/20 Yes Epifanio Lesches, MD  pantoprazole (PROTONIX) 40 MG tablet Take 40 mg by mouth 2 (two) times daily.    Yes [provider]  sucralfate (CARAFATE) 1 g tablet Take 1 g by mouth 4 (four) times daily.    Yes [provider]  tamsulosin (FLOMAX) 0.4 MG CAPS capsule Take 1 capsule (0.4 mg total) by mouth daily. 04/05/19  Yes Gladstone Lighter, MD  calcium carbonate (TUMS - DOSED IN MG ELEMENTAL CALCIUM) 500 MG chewable tablet Chew 1 tablet (200 mg of elemental calcium total) by mouth 3 (three) times daily as needed for indigestion or heartburn. 04/25/19   Nicholes Mango, MD  doxycycline (VIBRA-TABS) 100 MG tablet Take 100 mg by mouth 2 (two) times a day. For 17 days 05/08/19   [provider]    Family History  Family history unknown: Yes     Social History   Tobacco Use  . Smoking status: Former Research scientist (life sciences)  . Smokeless tobacco: Never Used  Substance Use Topics  . Alcohol use: Not Currently  . Drug use: Yes    Frequency: 0.5 times per week    Types: Marijuana, Cocaine    Allergies as of 06/10/2019 - Review Complete 06/10/2019  Allergen Reaction Noted  . Metformin and related  04/02/2019  . Motrin [ibuprofen]  04/02/2019    Review of Systems:    All systems reviewed and negative except where noted in HPI.   Physical Exam:  Vital signs in last 24 hours: Temp:  [98.7 F (37.1 C)] 98.7 F (37.1 C) (07/19 2129) Pulse Rate:  [110-133] 133 (07/20 0900) Resp:  [13-40] 22 (07/20 0900) BP: (150-196)/(101-155) 156/117 (07/20 0800) SpO2:  [95 %-100 %] 100 % (07/20 0900) Weight:  [78.9 kg] 78.9 kg (07/19 2128)   General:   Pleasant, cooperative in NAD Head:  Normocephalic and atraumatic. Eyes:   No icterus.   Conjunctiva pink. PERRLA. Ears:  Normal auditory acuity. Neck:  Supple; no masses or thyroidomegaly Lungs: Respirations even and unlabored. Lungs clear to auscultation bilaterally.   No wheezes,  crackles, or rhonchi.  Heart:  Regular rate and rhythm;  Without murmur, clicks, rubs or gallops Abdomen:  Soft, nondistended, nontender. Normal bowel sounds. No appreciable masses or hepatomegaly.  No rebound or guarding.  Neurologic:  Alert and oriented x3;  grossly normal neurologically. Skin:  Intact without significant lesions or rashes. Cervical Nodes:  No significant cervical adenopathy. Psych:  Alert and cooperative. Normal affect.  LAB RESULTS: Recent Labs    06/10/19 2136  WBC 9.7  HGB 11.8*  HCT 36.1*  PLT 327   BMET Recent Labs    06/10/19 2136 06/11/19 0941  NA 134* 136  K 3.3* 3.4*  CL 97* 102  CO2 21* 22  GLUCOSE 194* 331*  BUN 17 14  CREATININE 1.07 0.94  CALCIUM 9.6 8.7*   LFT Recent Labs    06/10/19 2136  PROT 8.2*  ALBUMIN 4.8  AST 25  ALT 12  ALKPHOS 72  BILITOT 0.6   PT/INR Recent Labs    06/11/19 0724  LABPROT 14.2  INR 1.1    STUDIES: Dg Abdomen Acute W/chest  Result Date: 06/11/2019 CLINICAL DATA:  Abdominal pain. Hematemesis. EXAM: DG ABDOMEN ACUTE W/ 1V CHEST COMPARISON:  Abdominal CT 05/24/2019, abdominal radiograph 05/15/2019 FINDINGS: Heart is normal in size. Normal mediastinal contours. No acute airspace disease. Minimal subsegmental atelectasis left lung base. No pleural fluid. No evidence of pneumomediastinum. No bowel dilatation to suggest obstruction. No free intra-abdominal air. Moderate stool in the right colon, small volume of stool distally. Surgical clips in the right upper quadrant from cholecystectomy and liver section. No radiopaque calculi. No acute osseous abnormalities. IMPRESSION: 1. Normal bowel gas pattern. Moderate stool in the right colon. No obstruction or free air. 2. No acute chest findings. Electronically Signed   By: Keith Rake M.D.   On: 06/11/2019 03:40      Impression / Plan:   Kenneth Copeland is a 53 y.o. y/o male with a history of diabetic gastroparesis, liver surgery for liver cancer performed  at [facility].  History of erosive esophagitis seen on last EGD at [facility] in October 2019.  Admitted with abdominal pain and hematemesis.  Hemoglobin actually has been stable over the past 2 weeks no acute drop noted.  I do note a microcytosis.  Gastric varices were seen on the CT scan 2 weeks back with abnormal area of the pancreatic drain and splenic vein thrombosis.  This could be related to a prior episode of pancreatitis.    Plan   1. Most likely the cause of hematemesis with no change in the hemoglobin is related to acid reflux causing erosive esophagitis.  The acid reflux in turn might be related to gastroparesis.  The treatment would involve PPI twice daily IV while in hospital, low fiber diet, low-fat diet predominantly liquid diet for the next day or 2.  I will plan to perform an upper endoscopy tomorrow. 2.  With the splenic vein thrombosis, gastric varices he may need to be evaluated for a distal pancreatectomy with splenectomy as an outpatient.  Thank you for involving me in the care of this patient.      LOS: 0 days   Kenneth Bellows, MD  06/11/2019, 10:32 AM

## 2019-06-11 NOTE — ED Provider Notes (Signed)
[facility] Emergency Department Provider Note  ____________________________________________  Time seen: Approximately 2:54 AM  I have reviewed the triage vital signs and the nursing notes.   HISTORY  Chief Complaint Abdominal Pain   HPI Kenneth Copeland is a 53 y.o. male diabetes, gastroparesis, hypertension, NAFL, liver cancer status post partial hepatectomy, pancreatitis who presents for evaluation of abdominal pain and hematemesis.  Patient reports several weeks of burning epigastric abdominal pain.  Has had several episodes of nonbloody nonbilious emesis over the last week.  Today started with coffee-ground emesis.  Has had several episodes of coffee-ground emesis.  Is complaining of severe burning epigastric pain which has been constant.  No chest pain or shortness of breath.  Patient recently moved from [place] to [place] and has not established care here yet.  No history of cirrhosis or varices.  Patient has had prior episodes of hematemesis attributed to severe gastroparesis.  Last endoscopy was in October 2019 at [facility] showing no evidence of varices.  He denies melena.   Past Medical History:  Diagnosis Date  . Arthritis   . Cancer ([place])   . Diabetes mellitus without complication ([place])   . Hypertension   . Liver cancer [facility])     Patient Active Problem List   Diagnosis Date Noted  . Intractable nausea and vomiting 05/21/2019  . Gastroparesis due to DM ([place]) 05/12/2019  . Acute respiratory failure ([place])   . Chest pain 04/08/2019  . Pericardial effusion 04/02/2019    Past Surgical History:  Procedure Laterality Date  . HERNIA REPAIR    . LIVER LOBECTOMY    . TEE WITHOUT CARDIOVERSION N/A 04/11/2019   Procedure: TRANSESOPHAGEAL ECHOCARDIOGRAM (TEE);  Surgeon: Corey Skains, MD;  Location: [facility] ORS;  Service: Cardiovascular;  Laterality: N/A;    Prior to Admission medications   Medication Sig Start Date End Date Taking?  Authorizing Provider  acetaminophen (TYLENOL) 325 MG tablet Take 650 mg by mouth every 6 (six) hours as needed for mild pain or fever.   Yes [provider]  colchicine 0.6 MG tablet Take 1 tablet (0.6 mg total) by mouth 2 (two) times daily. 04/05/19  Yes Gladstone Lighter, MD  dicyclomine (BENTYL) 10 MG capsule Take 10 mg by mouth 4 (four) times daily.    Yes [provider]  dicyclomine (BENTYL) 20 MG tablet Take 1 tablet (20 mg total) by mouth 3 (three) times daily as needed. 05/29/19  Yes Nance Pear, MD  docusate sodium (COLACE) 100 MG capsule Take 1 capsule (100 mg total) by mouth daily as needed for mild constipation. 04/25/19  Yes Gouru, Illene Silver, MD  DULoxetine (CYMBALTA) 30 MG capsule Take 1 capsule (30 mg total) by mouth daily. 04/05/19 07/04/19 Yes Gladstone Lighter, MD  gabapentin (NEURONTIN) 300 MG capsule Take 1 capsule (300 mg total) by mouth 3 (three) times daily. 04/05/19  Yes Gladstone Lighter, MD  insulin aspart (NOVOLOG) 100 UNIT/ML injection Inject 5 Units into the skin 3 (three) times daily with meals. 04/25/19  Yes Gouru, Aruna, MD  insulin glargine (LANTUS) 100 UNIT/ML injection Inject 0.4 mLs (40 Units total) into the skin 2 (two) times daily. 05/26/19  Yes Demetrios Loll, MD  ipratropium-albuterol (DUONEB) 0.5-2.5 (3) MG/3ML SOLN Take 3 mLs by nebulization every 6 (six) hours as needed. 04/25/19  Yes Gouru, Illene Silver, MD  metoCLOPramide (REGLAN) 10 MG tablet Take 1 tablet (10 mg total) by mouth 4 (four) times daily -  before meals and at bedtime. 05/22/19  Yes Hillary Bow, MD  Multiple Vitamin (MULTIVITAMIN WITH MINERALS) TABS tablet Take 1 tablet by mouth daily. 04/26/19  Yes Gouru, Aruna, MD  ondansetron (ZOFRAN) 4 MG tablet Take 1 tablet (4 mg total) by mouth every 6 (six) hours as needed for nausea. 05/22/19  Yes Sudini, Alveta Heimlich, MD  oxyCODONE (ROXICODONE) 5 MG immediate release tablet Take 2 tablets (10 mg total) by mouth every 8 (eight) hours as needed. 05/17/19 05/16/20  Yes Epifanio Lesches, MD  pantoprazole (PROTONIX) 40 MG tablet Take 40 mg by mouth 2 (two) times daily.    Yes [provider]  sucralfate (CARAFATE) 1 g tablet Take 1 g by mouth 4 (four) times daily.    Yes [provider]  tamsulosin (FLOMAX) 0.4 MG CAPS capsule Take 1 capsule (0.4 mg total) by mouth daily. 04/05/19  Yes Gladstone Lighter, MD  calcium carbonate (TUMS - DOSED IN MG ELEMENTAL CALCIUM) 500 MG chewable tablet Chew 1 tablet (200 mg of elemental calcium total) by mouth 3 (three) times daily as needed for indigestion or heartburn. 04/25/19   Nicholes Mango, MD  doxycycline (VIBRA-TABS) 100 MG tablet Take 100 mg by mouth 2 (two) times a day. For 17 days 05/08/19   [provider]    Allergies Metformin and related and Motrin [ibuprofen]  Family History  Family history unknown: Yes    Social History Social History   Tobacco Use  . Smoking status: Former Research scientist (life sciences)  . Smokeless tobacco: Never Used  Substance Use Topics  . Alcohol use: Not Currently  . Drug use: Yes    Frequency: 0.5 times per week    Types: Marijuana, Cocaine    Review of Systems  Constitutional: Negative for fever. Eyes: Negative for visual changes. ENT: Negative for sore throat. Neck: No neck pain  Cardiovascular: Negative for chest pain. Respiratory: Negative for shortness of breath. Gastrointestinal: + abdominal pain and hematemesis. No diarrhea or melena Genitourinary: Negative for dysuria. Musculoskeletal: Negative for back pain. Skin: Negative for rash. Neurological: Negative for headaches, weakness or numbness. Psych: No SI or HI  ____________________________________________   PHYSICAL EXAM:  VITAL SIGNS: ED Triage Vitals  Enc Vitals Group     BP 06/10/19 2129 (!) 186/124     Pulse Rate 06/10/19 2129 (!) 126     Resp 06/10/19 2129 (!) 22     Temp 06/10/19 2129 98.7 F (37.1 C)     Temp Source 06/10/19 2129 Oral     SpO2 06/10/19 2129 100 %     Weight  06/10/19 2128 174 lb (78.9 kg)     Height 06/10/19 2128 5\' 11"  (1.803 m)     Head Circumference --      Peak Flow --      Pain Score 06/10/19 2127 10     Pain Loc --      Pain Edu? --      Excl. in [place]? --     Constitutional: Alert and oriented, actively vomiting.  HEENT:      Head: Normocephalic and atraumatic.         Eyes: Conjunctivae are normal. Sclera is non-icteric.       Mouth/Throat: Mucous membranes are moist.       Neck: Supple with no signs of meningismus. Cardiovascular: Tachycardic with regular rate and rhythm. No murmurs, gallops, or rubs. 2+ symmetrical distal pulses are present in all extremities. No JVD. Respiratory: Normal respiratory effort. Lungs are clear to auscultation bilaterally. No wheezes, crackles, or rhonchi.  Gastrointestinal:  Soft, tender to palpation over the epigastric region, and non distended with positive bowel sounds. No rebound or guarding. Genitourinary: No CVA tenderness. Musculoskeletal: Nontender with normal range of motion in all extremities. No edema, cyanosis, or erythema of extremities. Neurologic: Normal speech and language. Face is symmetric. Moving all extremities. No gross focal neurologic deficits are appreciated. Skin: Skin is warm, dry and intact. No rash noted. Psychiatric: Mood and affect are normal. Speech and behavior are normal.  ____________________________________________   LABS (all labs ordered are listed, but only abnormal results are displayed)  Labs Reviewed  COMPREHENSIVE METABOLIC PANEL - Abnormal; Notable for the following components:      Result Value   Sodium 134 (*)    Potassium 3.3 (*)    Chloride 97 (*)    CO2 21 (*)    Glucose, Bld 194 (*)    Total Protein 8.2 (*)    Anion gap 16 (*)    All other components within normal limits  CBC - Abnormal; Notable for the following components:   Hemoglobin 11.8 (*)    HCT 36.1 (*)    MCV 78.5 (*)    MCH 25.7 (*)    RDW 17.0 (*)    All other components within  normal limits  URINALYSIS, COMPLETE (UACMP) WITH MICROSCOPIC - Abnormal; Notable for the following components:   Color, Urine YELLOW (*)    APPearance CLEAR (*)    Glucose, UA >=500 (*)    Ketones, ur 20 (*)    Protein, ur 30 (*)    All other components within normal limits  SARS CORONAVIRUS 2 (HOSPITAL ORDER, [place] [facility])  LIPASE, BLOOD  PROTIME-INR  TYPE AND SCREEN   ____________________________________________  EKG  ED ECG REPORT I, Rudene Re, the attending physician, personally viewed and interpreted this ECG.  Sinus tachycardia, rate of 125, normal intervals, borderline prolonged QTC, left axis deviation, LAFB, no ST elevations or depressions, inferior Q waves.  Unchanged from prior ____________________________________________  RADIOLOGY  I have personally reviewed the images performed during this visit and I agree with the Radiologist's read.   Interpretation by Radiologist:  Dg Abdomen Acute W/chest  Result Date: 06/11/2019 CLINICAL DATA:  Abdominal pain. Hematemesis. EXAM: DG ABDOMEN ACUTE W/ 1V CHEST COMPARISON:  Abdominal CT 05/24/2019, abdominal radiograph 05/15/2019 FINDINGS: Heart is normal in size. Normal mediastinal contours. No acute airspace disease. Minimal subsegmental atelectasis left lung base. No pleural fluid. No evidence of pneumomediastinum. No bowel dilatation to suggest obstruction. No free intra-abdominal air. Moderate stool in the right colon, small volume of stool distally. Surgical clips in the right upper quadrant from cholecystectomy and liver section. No radiopaque calculi. No acute osseous abnormalities. IMPRESSION: 1. Normal bowel gas pattern. Moderate stool in the right colon. No obstruction or free air. 2. No acute chest findings. Electronically Signed   By: Keith Rake M.D.   On: 06/11/2019 03:40      ____________________________________________   PROCEDURES  Procedure(s) performed:  None Procedures Critical Care performed:  none ____________________________________________   INITIAL IMPRESSION / ASSESSMENT AND PLAN / ED COURSE  53 y.o. male diabetes, gastroparesis, hypertension, NAFL, liver cancer status post partial hepatectomy, pancreatitis who presents for evaluation of abdominal pain and hematemesis.  Patient actively vomiting coffee-ground emesis, tachycardic and hypertensive, abdomen is soft with epigastric tenderness with no rebound or guarding.  Last endoscopy was done October 2019 in [facility] showing no varices.  No history of cirrhosis.  Patient with several prior episodes of  hematemesis in the setting of gastroparesis.  Will start patient on Protonix bolus and drip, IV fluids, Zofran, morphine for pain.  Labs showing stable hemoglobin at 11.8. Patient not on blood thinners. Will send type and cross and coags. Will admit to Hospitalist       As part of my medical decision making, I reviewed the following data within the [place] notes reviewed and incorporated, Labs reviewed , EKG interpreted , Old EKG reviewed, Old chart reviewed, Radiograph reviewed , Discussed with admitting physician , Notes from prior ED visits and [place] Controlled Substance Database   Patient was evaluated in Emergency Department today for the symptoms described in the history of present illness. Patient was evaluated in the context of the global COVID-19 pandemic, which necessitated consideration that the patient might be at risk for infection with the SARS-CoV-2 virus that causes COVID-19. Institutional protocols and algorithms that pertain to the evaluation of patients at risk for COVID-19 are in a state of rapid change based on information released by regulatory bodies including the CDC and federal and state organizations. These policies and algorithms were followed during the patient's care in the ED.   ____________________________________________   FINAL  CLINICAL IMPRESSION(S) / ED DIAGNOSES   Final diagnoses:  Gastroparesis  Hematemesis with nausea      NEW MEDICATIONS STARTED DURING THIS VISIT:  ED Discharge Orders    None       Note:  This document was prepared using Dragon voice recognition software and may include unintentional dictation errors.    Alfred Levins, [place], MD 06/11/19 [phone]

## 2019-06-11 NOTE — ED Notes (Signed)
ED TO INPATIENT HANDOFF REPORT  ED Nurse Name and Phone #: Stanton Kidney [phone]  S Name/Age/Gender Kenneth Copeland 53 y.o. male Room/Bed: ED08A/ED08A  Code Status   Code Status: Prior  Home/SNF/Other Home Patient oriented to: self, place, time and situation Is this baseline? Yes   Triage Complete: Triage complete  Chief Complaint abdominal pain   Triage Note Patient reports abdominal pain that started with morning with vomiting.   Allergies Allergies  Allergen Reactions  . Metformin And Related     Pt. States it makes his stomach bleed because of a stomach ulcer  . Motrin [Ibuprofen]     Pt. States it "inflames his stomach, causes bleeding in stomach"    Level of Care/Admitting Diagnosis ED Disposition    ED Disposition Condition [facility] Area: [place] [100120]  Level of Care: Med-Surg [16]  Covid Evaluation: Confirmed COVID Negative  Diagnosis: Intractable vomiting with nausea [[id number]]  Admitting Physician: Odessa Fleming  Attending Physician: Odessa Fleming  Estimated length of stay: past midnight tomorrow  Certification:: I certify this patient will need inpatient services for at least 2 midnights  PT Class (Do Not Modify): Inpatient [101]  PT Acc Code (Do Not Modify): Private [1]       B Medical/Surgery History Past Medical History:  Diagnosis Date  . Arthritis   . Cancer ([place])   . Diabetes mellitus without complication ([place])   . Hypertension   . Liver cancer [facility])    Past Surgical History:  Procedure Laterality Date  . HERNIA REPAIR    . LIVER LOBECTOMY    . TEE WITHOUT CARDIOVERSION N/A 04/11/2019   Procedure: TRANSESOPHAGEAL ECHOCARDIOGRAM (TEE);  Surgeon: Corey Skains, MD;  Location: [facility] ORS;  Service: Cardiovascular;  Laterality: N/A;     A IV Location/Drains/Wounds Patient Lines/Drains/Airways Status   Active Line/Drains/Airways    Name:   Placement date:   Placement time:   Site:    Days:   Peripheral IV 06/10/19 Left Forearm   06/10/19    2113    Forearm   1          Intake/Output Last 24 hours  Intake/Output Summary (Last 24 hours) at 06/11/2019 1102 Last data filed at 06/11/2019 0902 Gross per 24 hour  Intake 1100 ml  Output 400 ml  Net 700 ml    Labs/Imaging Results for orders placed or performed during the hospital encounter of 06/11/19 (from the past 48 hour(s))  Lipase, blood     Status: None   Collection Time: 06/10/19  9:36 PM  Result Value Ref Range   Lipase 29 11 - 51 U/L    Comment: Performed at [facility], [address] [place], [place] [address]  Comprehensive metabolic panel     Status: Abnormal   Collection Time: 06/10/19  9:36 PM  Result Value Ref Range   Sodium 134 (L) 135 - 145 mmol/L   Potassium 3.3 (L) 3.5 - 5.1 mmol/L    Comment: HEMOLYSIS AT THIS LEVEL MAY AFFECT RESULT   Chloride 97 (L) 98 - 111 mmol/L   CO2 21 (L) 22 - 32 mmol/L   Glucose, Bld 194 (H) 70 - 99 mg/dL   BUN 17 6 - 20 mg/dL   Creatinine, Ser 1.07 0.61 - 1.24 mg/dL   Calcium 9.6 8.9 - 10.3 mg/dL   Total Protein 8.2 (H) 6.5 - 8.1 g/dL   Albumin 4.8 3.5 - 5.0 g/dL   AST 25  15 - 41 U/L   ALT 12 0 - 44 U/L   Alkaline Phosphatase 72 38 - 126 U/L   Total Bilirubin 0.6 0.3 - 1.2 mg/dL   GFR calc non Af Amer >60 >60 mL/min   GFR calc Af Amer >60 >60 mL/min   Anion gap 16 (H) 5 - 15    Comment: Performed at [facility], [address] [place], [place] [address]  CBC     Status: Abnormal   Collection Time: 06/10/19  9:36 PM  Result Value Ref Range   WBC 9.7 4.0 - 10.5 K/uL   RBC 4.60 4.22 - 5.81 MIL/uL   Hemoglobin 11.8 (L) 13.0 - 17.0 g/dL   HCT 36.1 (L) 39.0 - 52.0 %   MCV 78.5 (L) 80.0 - 100.0 fL   MCH 25.7 (L) 26.0 - 34.0 pg   MCHC 32.7 30.0 - 36.0 g/dL   RDW 17.0 (H) 11.5 - 15.5 %   Platelets 327 150 - 400 K/uL   nRBC 0.0 0.0 - 0.2 %    Comment: Performed at [facility], [address] [place], [place] [address]   Urinalysis, Complete w Microscopic     Status: Abnormal   Collection Time: 06/11/19  3:15 AM  Result Value Ref Range   Color, Urine YELLOW (A) YELLOW   APPearance CLEAR (A) CLEAR   Specific Gravity, Urine 1.016 1.005 - 1.030   pH 8.0 5.0 - 8.0   Glucose, UA >=500 (A) NEGATIVE mg/dL   Hgb urine dipstick NEGATIVE NEGATIVE   Bilirubin Urine NEGATIVE NEGATIVE   Ketones, ur 20 (A) NEGATIVE mg/dL   Protein, ur 30 (A) NEGATIVE mg/dL   Nitrite NEGATIVE NEGATIVE   Leukocytes,Ua NEGATIVE NEGATIVE   RBC / HPF 0-5 0 - 5 RBC/hpf   WBC, UA 0-5 0 - 5 WBC/hpf   Bacteria, UA NONE SEEN NONE SEEN   Squamous Epithelial / LPF NONE SEEN 0 - 5    Comment: Performed at [facility], [address] [place], [place] [address]  SARS Coronavirus 2 (CEPHEID - Performed in [place] hospital lab), Hosp Order     Status: None   Collection Time: 06/11/19  5:49 AM   Specimen: Nasopharyngeal Swab  Result Value Ref Range   SARS Coronavirus 2 NEGATIVE NEGATIVE    Comment: (NOTE) If result is NEGATIVE SARS-CoV-2 target nucleic acids are NOT DETECTED. The SARS-CoV-2 RNA is generally detectable in upper and lower  respiratory specimens during the acute phase of infection. The lowest  concentration of SARS-CoV-2 viral copies this assay can detect is 250  copies / mL. A negative result does not preclude SARS-CoV-2 infection  and should not be used as the sole basis for treatment or other  patient management decisions.  A negative result may occur with  improper specimen collection / handling, submission of specimen other  than nasopharyngeal swab, presence of viral mutation(s) within the  areas targeted by this assay, and inadequate number of viral copies  (<250 copies / mL). A negative result must be combined with clinical  observations, patient history, and epidemiological information. If result is POSITIVE SARS-CoV-2 target nucleic acids are DETECTED. The SARS-CoV-2 RNA is generally detectable in  upper and lower  respiratory specimens dur ing the acute phase of infection.  Positive  results are indicative of active infection with SARS-CoV-2.  Clinical  correlation with patient history and other diagnostic information is  necessary to determine patient infection status.  Positive results do  not rule out bacterial infection or co-infection with other viruses. If result is PRESUMPTIVE POSTIVE SARS-CoV-2 nucleic acids MAY BE PRESENT.   A presumptive positive result was obtained on the submitted specimen  and confirmed on repeat testing.  While 2019 novel coronavirus  (SARS-CoV-2) nucleic acids may be present in the submitted sample  additional confirmatory testing may be necessary for epidemiological  and / or clinical management purposes  to differentiate between  SARS-CoV-2 and other Sarbecovirus currently known to infect humans.  If clinically indicated additional testing with an alternate test  methodology [phone]) is advised. The SARS-CoV-2 RNA is generally  detectable in upper and lower respiratory sp ecimens during the acute  phase of infection. The expected result is Negative. Fact Sheet for Patients:  StrictlyIdeas.no Fact Sheet for Healthcare Providers: BankingDealers.co.za This test is not yet approved or cleared by the [place] FDA and has been authorized for detection and/or diagnosis of SARS-CoV-2 by FDA under an Emergency Use Authorization (EUA).  This EUA will remain in effect (meaning this test can be used) for the duration of the COVID-19 declaration under Section 564(b)(1) of the Act, 21 U.S.C. section 360bbb-3(b)(1), unless the authorization is terminated or revoked sooner. Performed at [facility], [address] [place], [place] [address]   Protime-INR     Status: None   Collection Time: 06/11/19  7:24 AM  Result Value Ref Range   Prothrombin Time 14.2 11.4 - 15.2 seconds   INR 1.1 0.8 -  1.2    Comment: (NOTE) INR goal varies based on device and disease states. Performed at [facility], [address] [place], [place] [address]   Glucose, capillary     Status: Abnormal   Collection Time: 06/11/19  7:30 AM  Result Value Ref Range   Glucose-Capillary 346 (H) 70 - 99 mg/dL  Type and screen     Status: None   Collection Time: 06/11/19  8:18 AM  Result Value Ref Range   ABO/RH(D) A POS    Antibody Screen NEG    Sample Expiration      06/14/2019,2359 Performed at [facility], [address] [place], [place] [address]   Basic metabolic panel     Status: Abnormal   Collection Time: 06/11/19  9:41 AM  Result Value Ref Range   Sodium 136 135 - 145 mmol/L   Potassium 3.4 (L) 3.5 - 5.1 mmol/L   Chloride 102 98 - 111 mmol/L   CO2 22 22 - 32 mmol/L   Glucose, Bld 331 (H) 70 - 99 mg/dL   BUN 14 6 - 20 mg/dL   Creatinine, Ser 0.94 0.61 - 1.24 mg/dL   Calcium 8.7 (L) 8.9 - 10.3 mg/dL   GFR calc non Af Amer >60 >60 mL/min   GFR calc Af Amer >60 >60 mL/min   Anion gap 12 5 - 15    Comment: Performed at [facility], [address], [place], [place] [address]   Dg Abdomen Acute W/chest  Result Date: 06/11/2019 CLINICAL DATA:  Abdominal pain. Hematemesis. EXAM: DG ABDOMEN ACUTE W/ 1V CHEST COMPARISON:  Abdominal CT 05/24/2019, abdominal radiograph 05/15/2019 FINDINGS: Heart is normal in size. Normal mediastinal contours. No acute airspace disease. Minimal subsegmental atelectasis left lung base. No pleural fluid. No evidence of pneumomediastinum. No bowel dilatation to suggest obstruction. No free intra-abdominal air. Moderate stool in the right colon, small volume of stool distally. Surgical clips in the right upper quadrant from cholecystectomy and liver section. No  radiopaque calculi. No acute osseous abnormalities. IMPRESSION: 1. Normal bowel gas pattern. Moderate stool in the right colon. No obstruction or free air. 2. No acute chest findings.  Electronically Signed   By: Keith Rake M.D.   On: 06/11/2019 03:40    Pending Labs [employer] (From admission, onward)    Start     Ordered   Signed and Held  CBC  Tomorrow morning,   R     Signed and Held   Signed and Held  Protime-INR  Tomorrow morning,   R     Signed and Held   Signed and Held  Hemoglobin and hematocrit, blood  Now then every 6 hours,   R     Signed and Held          Vitals/Pain Today's Vitals   06/11/19 0900 06/11/19 1000 06/11/19 1030 06/11/19 1051  BP:  (!) 164/111 (!) 149/104   Pulse: (!) 133 (!) 113 (!) 109   Resp: (!) 22 20    Temp:      TempSrc:      SpO2: 100% 98% 98%   Weight:      Height:      PainSc:    Asleep    Isolation Precautions No active isolations  Medications Medications  pantoprazole (PROTONIX) 80 mg in sodium chloride 0.9 % 250 mL (0.32 mg/mL) infusion (8 mg/hr Intravenous New Bag/Given 06/11/19 0538)  pantoprazole (PROTONIX) injection 40 mg (has no administration in time range)  0.9 %  sodium chloride infusion ( Intravenous New Bag/Given 06/11/19 0945)  insulin aspart (novoLOG) injection 0-15 Units (11 Units Subcutaneous Given 06/11/19 0839)  insulin aspart (novoLOG) injection 0-5 Units (has no administration in time range)  sodium chloride flush (NS) 0.9 % injection 3 mL (3 mLs Intravenous Given 06/10/19 2142)  fentaNYL (SUBLIMAZE) injection 50 mcg (50 mcg Intravenous Given 06/10/19 2141)  pantoprazole (PROTONIX) 80 mg in sodium chloride 0.9 % 100 mL IVPB (0 mg Intravenous Stopped 06/11/19 0506)  ondansetron (ZOFRAN) injection 4 mg (4 mg Intravenous Given 06/11/19 0309)  sodium chloride 0.9 % bolus 1,000 mL (0 mLs Intravenous Stopped 06/11/19 0541)  morphine 4 MG/ML injection 4 mg (4 mg Intravenous Given 06/11/19 0309)  morphine 4 MG/ML injection 4 mg (4 mg Intravenous Given 06/11/19 0557)  haloperidol lactate (HALDOL) injection 2 mg (2 mg Intravenous Given 06/11/19 0350)    Mobility walks Low fall risk   Focused  Assessments GI assessment  R Recommendations: See Admitting Provider Note  Report given to:   Additional Notes:

## 2019-06-12 ENCOUNTER — Encounter: Payer: Self-pay | Admitting: Anesthesiology

## 2019-06-12 ENCOUNTER — Inpatient Hospital Stay: Payer: Medicare Other | Admitting: Certified Registered"

## 2019-06-12 ENCOUNTER — Encounter
Admission: EM | Disposition: A | Discharge status: Home / Self Care | Payer: Self-pay | Source: Home / Self Care | Attending: Internal Medicine

## 2019-06-12 HISTORY — PX: ESOPHAGOGASTRODUODENOSCOPY (EGD) WITH PROPOFOL: SHX5813

## 2019-06-12 LAB — GLUCOSE, CAPILLARY
Glucose-Capillary: 86 mg/dL (ref 70–99)
Glucose-Capillary: 104 mg/dL — ABNORMAL HIGH (ref 70–99)
Glucose-Capillary: 241 mg/dL — ABNORMAL HIGH (ref 70–99)
Glucose-Capillary: 222 mg/dL — ABNORMAL HIGH (ref 70–99)

## 2019-06-12 LAB — HEMOGLOBIN AND HEMATOCRIT, BLOOD
HCT: 30.3 % — ABNORMAL LOW (ref 39.0–52.0)
HCT: 31.6 % — ABNORMAL LOW (ref 39.0–52.0)
Hemoglobin: 9.7 g/dL — ABNORMAL LOW (ref 13.0–17.0)
Hemoglobin: 9.9 g/dL — ABNORMAL LOW (ref 13.0–17.0)

## 2019-06-12 SURGERY — ESOPHAGOGASTRODUODENOSCOPY (EGD) WITH PROPOFOL
Anesthesia: General

## 2019-06-12 MED ORDER — OXYCODONE HCL 5 MG PO TABS
5.0000 mg | ORAL_TABLET | Freq: Four times a day (QID) | ORAL | Status: DC | PRN
  Administered 2019-06-12 – 2019-06-14 (×5): 5 mg via ORAL
  Filled 2019-06-12 (×5): qty 1

## 2019-06-12 MED ORDER — SODIUM CHLORIDE 0.9 % IV SOLN
INTRAVENOUS | Status: DC
  Administered 2019-06-12: 11:00:00 via INTRAVENOUS

## 2019-06-12 MED ORDER — MORPHINE SULFATE (PF) 2 MG/ML IV SOLN
1.0000 mg | Freq: Four times a day (QID) | INTRAVENOUS | Status: DC | PRN
  Administered 2019-06-12 – 2019-06-13 (×3): 1 mg via INTRAVENOUS
  Filled 2019-06-12 (×3): qty 1

## 2019-06-12 MED ORDER — MORPHINE SULFATE (PF) 2 MG/ML IV SOLN: 1.0000 mg | INTRAVENOUS | Status: DC | PRN

## 2019-06-12 MED ORDER — LABETALOL HCL 5 MG/ML IV SOLN
10.0000 mg | Freq: Once | INTRAVENOUS | Status: AC
  Administered 2019-06-12: 22:00:00 10 mg via INTRAVENOUS
  Filled 2019-06-12: qty 4

## 2019-06-12 MED ORDER — PROPOFOL 10 MG/ML IV BOLUS
INTRAVENOUS | Status: DC | PRN
  Administered 2019-06-12: 50 mg via INTRAVENOUS
  Administered 2019-06-12 (×3): 20 mg via INTRAVENOUS

## 2019-06-12 MED ORDER — BOOST / RESOURCE BREEZE PO LIQD CUSTOM
1.0000 | Freq: Three times a day (TID) | ORAL | Status: DC
  Administered 2019-06-12 – 2019-06-14 (×6): 1 via ORAL

## 2019-06-12 MED ORDER — PANTOPRAZOLE SODIUM 40 MG IV SOLR
40.0000 mg | Freq: Two times a day (BID) | INTRAVENOUS | Status: DC
  Administered 2019-06-13 – 2019-06-14 (×3): 40 mg via INTRAVENOUS
  Filled 2019-06-12 (×3): qty 40

## 2019-06-12 NOTE — Progress Notes (Signed)
[place] [facility] [place] NAME: Kenneth Copeland    MR#:  [id number]  DATE OF BIRTH:  July 25, 1966  SUBJECTIVE:  patient currently dry heaving and retching. Complains of abdominal pain. Wants IV narcotics. He has history of overuse of pain meds in the past.  Tolerated part of his clear liquid diet.  REVIEW OF SYSTEMS:   Review of Systems  Constitutional: Negative for chills, fever and weight loss.  HENT: Negative for ear discharge, ear pain and nosebleeds.   Eyes: Negative for blurred vision, pain and discharge.  Respiratory: Negative for sputum production, shortness of breath, wheezing and stridor.   Cardiovascular: Negative for chest pain, palpitations, orthopnea and PND.  Gastrointestinal: Positive for abdominal pain, heartburn and vomiting. Negative for diarrhea and nausea.  Genitourinary: Negative for frequency and urgency.  Musculoskeletal: Negative for back pain and joint pain.  Neurological: Negative for sensory change, speech change, focal weakness and weakness.  Psychiatric/Behavioral: Negative for depression and hallucinations. The patient is not nervous/anxious.    Tolerating Diet:clears Tolerating PT: ambulatory  DRUG ALLERGIES:   Allergies  Allergen Reactions  . Metformin And Related     Pt. States it makes his stomach bleed because of a stomach ulcer  . Motrin [Ibuprofen]     Pt. States it "inflames his stomach, causes bleeding in stomach"    VITALS:  Blood pressure (!) 142/90, pulse 94, temperature (!) 97.2 F (36.2 C), temperature source Tympanic, resp. rate (!) 22, height 5\' 11"  (1.803 m), weight 78.9 kg, SpO2 99 %.  PHYSICAL EXAMINATION:   Physical Exam  GENERAL:  53 y.o.-year-old patient lying in the bed with no acute distress. Disheveled, chronically ill EYES: Pupils equal, round, reactive to light and accommodation. No scleral icterus. Extraocular muscles intact.  HEENT: Head atraumatic, normocephalic.  Oropharynx and nasopharynx clear.  NECK:  Supple, no jugular venous distention. No thyroid enlargement, no tenderness.  LUNGS: Normal breath sounds bilaterally, no wheezing, rales, rhonchi. No use of accessory muscles of respiration.  CARDIOVASCULAR: S1, S2 normal. No murmurs, rubs, or gallops.  ABDOMEN: Soft, nontender, nondistended. Bowel sounds present. No organomegaly or mass.  EXTREMITIES: No cyanosis, clubbing or edema b/l.    NEUROLOGIC: Cranial nerves II through XII are intact. No focal Motor or sensory deficits b/l.   PSYCHIATRIC:  patient is alert and oriented x 3.  SKIN: No obvious rash, lesion, or ulcer.   LABORATORY PANEL:  CBC Recent Labs  Lab 06/11/19 1312  06/12/19 0638  WBC 22.8*  --   --   HGB 12.1*   < > 9.9*  HCT 38.4*   < > 31.6*  PLT 368  --   --    < > = values in this interval not displayed.    Chemistries  Recent Labs  Lab 06/10/19 2136 06/11/19 0941  NA 134* 136  K 3.3* 3.4*  CL 97* 102  CO2 21* 22  GLUCOSE 194* 331*  BUN 17 14  CREATININE 1.07 0.94  CALCIUM 9.6 8.7*  AST 25  --   ALT 12  --   ALKPHOS 72  --   BILITOT 0.6  --    Cardiac Enzymes No results for input(s): TROPONINI in the last 168 hours. RADIOLOGY:  Dg Abdomen Acute W/chest  Result Date: 06/11/2019 CLINICAL DATA:  Abdominal pain. Hematemesis. EXAM: DG ABDOMEN ACUTE W/ 1V CHEST COMPARISON:  Abdominal CT 05/24/2019, abdominal radiograph 05/15/2019 FINDINGS: Heart is normal in size. Normal mediastinal contours. No acute airspace  disease. Minimal subsegmental atelectasis left lung base. No pleural fluid. No evidence of pneumomediastinum. No bowel dilatation to suggest obstruction. No free intra-abdominal air. Moderate stool in the right colon, small volume of stool distally. Surgical clips in the right upper quadrant from cholecystectomy and liver section. No radiopaque calculi. No acute osseous abnormalities. IMPRESSION: 1. Normal bowel gas pattern. Moderate stool in the right colon.  No obstruction or free air. 2. No acute chest findings. Electronically Signed   By: Keith Rake M.D.   On: 06/11/2019 03:40   ASSESSMENT AND PLAN:  Kenneth Copeland  is a 53 y.o. male with a known history of diabetes, gastroparesis, hypertension, liver cancer status post partial hepatectomy, pancreatitis, and GI bleed.  He presents to the emergency room complaining of increased epigastric and right upper quadrant abdominal pain described as severe burning which has become worse over the last 12 to 24 hours. Patient has had repeated admissions for GI bleed and similar symptoms.   1. GI bleed with epigastric pain due to peptic ulcer disease - IV Protonix infusion--- change to IV Protonix BID -  EGD shows two superficial ulcers with esophagitis and large hiatal hernia - Dr Vicente Males gastroenterology, consulted for further evaluation and recommendations -  came in with hemoglobin of 12--- 9.7 -continue to monitor  2. gastroparesis - IV antiemetic - IV analgesic - Reglan continued  3.  Diabetes mellitus - sliding scale insulin -I will hold off on Lantos and as part with meals secondary to patient's poor PO intake.  4.  History of liver cancer - Patient reports to be in remission having been treated with chemotherapy in [place] prior to relocation  DVT prophylaxis with SCDs and PPI prophylaxis initiated   Case discussed with Care Management/Social Worker. Management plans discussed with the patient  CODE STATUS: full  DVT Prophylaxis: SCD  TOTAL TIME TAKING CARE OF THIS PATIENT: *30* minutes.  >50% time spent on counselling and coordination of care  POSSIBLE D/C IN few DAYS, DEPENDING ON CLINICAL CONDITION.  Note: This dictation was prepared with Dragon dictation along with smaller phrase technology. Any transcriptional errors that result from this process are unintentional.  Fritzi Mandes M.D on 06/12/2019 at 1:59 PM  Between 7am to 6pm - Pager - [phone]  After 6pm go  to [email or web address] - password [email or web address] [place]  Office  [phone]  CC: Primary care physician; [name], PAPatient ID: Kenneth Copeland, male   DOB: Oct 03, 1966, 53 y.o.   MRN: [id number]

## 2019-06-12 NOTE — Progress Notes (Addendum)
Patient complains of pain 10/10, morphine and oxycodone given. Blood pressure 154/107, NP [facility] notified. Lavetalol 10mg  ordered. Will recheck blood pressure after administration. Stacie Glaze, RN   [phone]: blood pressure 107/96, patient states he feels better, no ringing in ears.

## 2019-06-12 NOTE — TOC Initial Note (Signed)
Transition of Care [facility]) - Initial/Assessment Note    Patient Details  Name: Kenneth Copeland MRN: [id number] Date of Birth: 1966-04-02  Transition of Care [facility]) CM/SW Contact:    Khalaya Mcgurn, Lenice Llamas Phone Number: [phone]  06/12/2019, 3:01 PM  Clinical Narrative: Patient has a high risk score for readmission. On last admission patient discharged to [facility]. Clinical Education officer, museum (CSW) met with patient alone at bedside to offer resources. Patient was alert and oriented X4 and was sitting up on the side of the bed. Patient reported that he was not feeling well. CSW provided emotional support. Patient reported that he now lives with his daughter in law in [place] and has no needs or concerns. CSW will continue to follow and assist as needed.             Expected Discharge Plan: Home/Self Care Barriers to Discharge: Continued Medical Work up   Patient Goals and CMS Choice Patient states their goals for this hospitalization and ongoing recovery are:: To feel better.      Expected Discharge Plan and Services Expected Discharge Plan: Home/Self Care In-house Referral: Clinical Social Work     Living arrangements for the past 2 months: Single Family Home                                      Prior Living Arrangements/Services Living arrangements for the past 2 months: Single Family Home Lives with:: Relatives(Daughter in law.) Patient language and need for interpreter reviewed:: No Do you feel safe going back to the place where you live?: Yes      Need for Family Participation in Patient Care: No (Comment) Care giver support system in place?: Yes (comment)   Criminal Activity/Legal Involvement Pertinent to Current Situation/Hospitalization: No - Comment as needed  Activities of Daily Living Home Assistive Devices/Equipment: None ADL Screening (condition at time of admission) Patient's cognitive ability adequate to safely complete daily activities?:  Yes Is the patient deaf or have difficulty hearing?: No Does the patient have difficulty seeing, even when wearing glasses/contacts?: No Does the patient have difficulty concentrating, remembering, or making decisions?: No Patient able to express need for assistance with ADLs?: Yes Does the patient have difficulty dressing or bathing?: No Independently performs ADLs?: Yes (appropriate for developmental age) Does the patient have difficulty walking or climbing stairs?: No Weakness of Legs: None Weakness of Arms/Hands: None  Permission Sought/Granted                  Emotional Assessment Appearance:: Appears stated age   Affect (typically observed): Calm Orientation: : Oriented to Self, Oriented to Place, Oriented to  Time, Oriented to Situation Alcohol / Substance Use: Not Applicable Psych Involvement: No (comment)  Admission diagnosis:  Gastroparesis [K31.84] Hematemesis with nausea [K92.0] Patient Active Problem List   Diagnosis Date Noted  . Intractable vomiting with nausea 06/11/2019  . GI bleed 06/11/2019  . Intractable nausea and vomiting 05/21/2019  . Gastroparesis due to DM ([place]) 05/12/2019  . Acute respiratory failure ([place])   . Chest pain 04/08/2019  . Pericardial effusion 04/02/2019   PCP:  [name], PA Pharmacy:   [facility] [address] [place] [address] [place] [address] Phone: [phone] Fax: [phone]     Social Determinants of Health (SDOH) Interventions    Readmission Risk Interventions Readmission Risk Prevention Plan 06/11/2019  04/12/2019  Transportation Screening Complete Complete  Home Care Screening - Complete  Medication Review (RN CM) - Complete  Medication Review (RN Care Manager) Complete -  Palliative Care Screening Not Applicable -

## 2019-06-12 NOTE — Anesthesia Postprocedure Evaluation (Signed)
Anesthesia Post Note  Patient: Kenneth Copeland  Procedure(s) Performed: ESOPHAGOGASTRODUODENOSCOPY (EGD) WITH PROPOFOL (N/A )  Patient location during evaluation: Endoscopy Anesthesia Type: General Level of consciousness: awake and alert Pain management: pain level controlled Vital Signs Assessment: post-procedure vital signs reviewed and stable Respiratory status: spontaneous breathing, nonlabored ventilation, respiratory function stable and patient connected to nasal cannula oxygen Cardiovascular status: blood pressure returned to baseline and stable Postop Assessment: no apparent nausea or vomiting Anesthetic complications: no     Last Vitals:  Vitals:   06/12/19 1142 06/12/19 1152  BP: (!) 130/93 (!) 142/90  Pulse: 97 94  Resp: 18 (!) 22  Temp:    SpO2: 99% 99%    Last Pain:  Vitals:   06/12/19 1132  TempSrc:   PainSc: 0-No pain                 Martha Clan

## 2019-06-12 NOTE — Op Note (Signed)
[facility] Patient Name: Kenneth Copeland Procedure Date: 06/12/2019 10:37 AM MRN: [id number] Account #: [id number] Date of Birth: [birth year] Admit Type: Outpatient Age: 53 Room: [facility] ENDO ROOM 1 Gender: Male Note Status: Finalized Procedure:            Upper GI endoscopy Indications:          Hematemesis Providers:            Jonathon Bellows MD, MD Medicines:            Monitored Anesthesia Care Complications:        No immediate complications. Procedure:            Pre-Anesthesia Assessment:                       - Prior to the procedure, a History and Physical was                        performed, and patient medications, allergies and                        sensitivities were reviewed. The patient's tolerance of                        previous anesthesia was reviewed.                       - The risks and benefits of the procedure and the                        sedation options and risks were discussed with the                        patient. All questions were answered and informed                        consent was obtained.                       - ASA Grade Assessment: III - A patient with severe                        systemic disease.                       After obtaining informed consent, the endoscope was                        passed under direct vision. Throughout the procedure,                        the patient's blood pressure, pulse, and oxygen                        saturations were monitored continuously. The Endoscope                        was introduced through the mouth, and advanced to the                        third part of duodenum. The upper GI endoscopy was  accomplished with ease. The patient tolerated the                        procedure well. Findings:      The examined duodenum was normal.      LA Grade B (one or more mucosal breaks greater than 5 mm, not extending       between the tops of two  mucosal folds) esophagitis with no bleeding was       found in the lower third of the esophagus. Biopsies were taken with a       cold forceps for histology.      A large hiatal hernia was present.      Two non-bleeding superficial gastric ulcers with a clean ulcer base       (Forrest Class III) were found in the cardia. The largest lesion was 5       mm in largest dimension. Biopsies were taken with a cold forceps for       histology. Impression:           - Normal examined duodenum.                       - LA Grade B reflux esophagitis. Biopsied.                       - Large hiatal hernia.                       - Non-bleeding gastric ulcers with a clean ulcer base                        (Forrest Class III). Biopsied. Recommendation:       - Return patient to hospital ward for ongoing care.                       - Advance diet as tolerated.                       - Continue present medications.                       - 1. No nsaids                       2. F/u biopsy                       3. Repeat EGD in 6 weeks Procedure Code(s):    --- Professional ---                       [phone], Esophagogastroduodenoscopy, flexible, transoral;                        with biopsy, single or multiple Diagnosis Code(s):    --- Professional ---                       K21.0, Gastro-esophageal reflux disease with esophagitis                       K44.9, Diaphragmatic hernia without obstruction or  gangrene                       K25.9, Gastric ulcer, unspecified as acute or chronic,                        without hemorrhage or perforation                       K92.0, Hematemesis CPT copyright 2019 American Medical Association. All rights reserved. The codes documented in this report are preliminary and upon coder review may  be revised to meet current compliance requirements. Jonathon Bellows, MD Jonathon Bellows MD, MD 06/12/2019 11:02:19 AM This report has been signed electronically. Number  of Addenda: 0 Note Initiated On: 06/12/2019 10:37 AM Estimated Blood Loss: Estimated blood loss: none.      [facility]

## 2019-06-12 NOTE — Anesthesia Post-op Follow-up Note (Signed)
Anesthesia QCDR form completed.        

## 2019-06-12 NOTE — Progress Notes (Signed)
Initial Nutrition Assessment  DOCUMENTATION CODES:   Not applicable  INTERVENTION:  Provide Boost Breeze po TID with meals, each supplement provides 250 kcal and 9 grams of protein.  Continue daily MVI.  Encouraged small, frequent meals in setting of N/V with hx of gastroparesis.  NUTRITION DIAGNOSIS:   Inadequate oral intake related to decreased appetite, nausea, vomiting as evidenced by per patient/family report.  GOAL:   Patient will meet greater than or equal to 90% of their needs  MONITOR:   PO intake, Supplement acceptance, Labs, Weight trends, I & O's  REASON FOR ASSESSMENT:   Malnutrition Screening Tool    ASSESSMENT:   53 year old male with PMHx of DM, arthritis, HTN, gastroparesis, liver cancer s/p partial hepatectomy, pancreatitis, hx GI bleed admitted with intractable N/V, hematemesis, abdominal pain.   -Patient underwent EGD today which found large hiatal hernia and non-bleeding gastric ulcers with clean ulcer base.  Met with patient at bedside this morning prior to EGD. He was very nauseous and unable to provide a very thorough history. He reports he has not been eating well for months now due to N/V. He attempts to eat but cannot keep much down. He reports he drinks Ensure Clear and Boost Breeze at home and would like some here when diet advanced.  Patient reports he is unsure of UBW but believes he has lost 15 lbs. Per chart patient was 83.6 kg on 04/25/2019. He is now 78.9 kg (174 lbs). He has lost 4.7 kg (5.6% body weight) over the past 1.5 months, which is significant for time frame.  Medications reviewed and include: gabapentin, Novolog 0-15 units TID, Novolog 0-5 units QHS, Novolog 5 units TID, Lantus 40 units BID, Reglan 10 mg TID and QHS, MVI daily, pantoprazole, Carafate 1 gram QID, Flomax, NS at 75 mL/hr.  Labs reviewed: CBG 86-104.  Patient is at risk for malnutrition.  NUTRITION - FOCUSED PHYSICAL EXAM:    Most Recent Value  Orbital Region  No  depletion  Upper Arm Region  Mild depletion  Thoracic and Lumbar Region  No depletion  Buccal Region  No depletion  Temple Region  Mild depletion  Clavicle Bone Region  No depletion  Clavicle and Acromion Bone Region  No depletion  Scapular Bone Region  Unable to assess  Dorsal Hand  No depletion  Patellar Region  No depletion  Anterior Thigh Region  No depletion  Posterior Calf Region  No depletion  Edema (RD Assessment)  None  Hair  Reviewed  Eyes  Reviewed  Mouth  Reviewed  Skin  Reviewed  Nails  Reviewed     Diet Order:   Diet Order            DIET SOFT Room service appropriate? Yes; Fluid consistency: Thin  Diet effective now             EDUCATION NEEDS:   No education needs have been identified at this time  Skin:  Skin Assessment: Reviewed RN Assessment  Last BM:  06/11/2019 per chart  Height:   Ht Readings from Last 1 Encounters:  06/10/19 '5\' 11"'$  (1.803 m)   Weight:   Wt Readings from Last 1 Encounters:  06/10/19 78.9 kg   Ideal Body Weight:     BMI:  Body mass index is 24.27 kg/m.  Estimated Nutritional Needs:   Kcal:  2100-2300  Protein:  105-115 grams  Fluid:  2.1-2.3 L/day  Willey Blade, MS, RD, LDN Office: [phone] Pager: [phone] After Hours/Weekend Pager:  [phone]  

## 2019-06-12 NOTE — H&P (Signed)
Jonathon Bellows, MD [address], [place], [place], [place], [address] [address], [place], [place], [place], [address] Phone: [phone]  Fax: [phone]  Primary Care Physician:  [name], PA   Pre-Procedure History & Physical: HPI:  Kenneth Copeland is a 53 y.o. male is here for an endoscopy    Past Medical History:  Diagnosis Date  . Arthritis   . Cancer ([place])   . Diabetes mellitus without complication ([place])   . Hypertension   . Liver cancer [facility])     Past Surgical History:  Procedure Laterality Date  . HERNIA REPAIR    . LIVER LOBECTOMY    . TEE WITHOUT CARDIOVERSION N/A 04/11/2019   Procedure: TRANSESOPHAGEAL ECHOCARDIOGRAM (TEE);  Surgeon: Corey Skains, MD;  Location: [facility] ORS;  Service: Cardiovascular;  Laterality: N/A;    Prior to Admission medications   Medication Sig Start Date End Date Taking? Authorizing Provider  acetaminophen (TYLENOL) 325 MG tablet Take 650 mg by mouth every 6 (six) hours as needed for mild pain or fever.   Yes [provider]  colchicine 0.6 MG tablet Take 1 tablet (0.6 mg total) by mouth 2 (two) times daily. 04/05/19  Yes Gladstone Lighter, MD  dicyclomine (BENTYL) 10 MG capsule Take 10 mg by mouth 4 (four) times daily.    Yes [provider]  dicyclomine (BENTYL) 20 MG tablet Take 1 tablet (20 mg total) by mouth 3 (three) times daily as needed. 05/29/19  Yes Nance Pear, MD  docusate sodium (COLACE) 100 MG capsule Take 1 capsule (100 mg total) by mouth daily as needed for mild constipation. 04/25/19  Yes Gouru, Illene Silver, MD  DULoxetine (CYMBALTA) 30 MG capsule Take 1 capsule (30 mg total) by mouth daily. 04/05/19 07/04/19 Yes Gladstone Lighter, MD  gabapentin (NEURONTIN) 300 MG capsule Take 1 capsule (300 mg total) by mouth 3 (three) times daily. 04/05/19  Yes Gladstone Lighter, MD  insulin aspart (NOVOLOG) 100 UNIT/ML injection Inject 5 Units into the skin 3 (three) times daily with meals. 04/25/19  Yes  Gouru, Aruna, MD  insulin glargine (LANTUS) 100 UNIT/ML injection Inject 0.4 mLs (40 Units total) into the skin 2 (two) times daily. 05/26/19  Yes Demetrios Loll, MD  ipratropium-albuterol (DUONEB) 0.5-2.5 (3) MG/3ML SOLN Take 3 mLs by nebulization every 6 (six) hours as needed. 04/25/19  Yes Gouru, Illene Silver, MD  metoCLOPramide (REGLAN) 10 MG tablet Take 1 tablet (10 mg total) by mouth 4 (four) times daily -  before meals and at bedtime. 05/22/19  Yes Sudini, Alveta Heimlich, MD  Multiple Vitamin (MULTIVITAMIN WITH MINERALS) TABS tablet Take 1 tablet by mouth daily. 04/26/19  Yes Gouru, Aruna, MD  ondansetron (ZOFRAN) 4 MG tablet Take 1 tablet (4 mg total) by mouth every 6 (six) hours as needed for nausea. 05/22/19  Yes Sudini, Alveta Heimlich, MD  oxyCODONE (ROXICODONE) 5 MG immediate release tablet Take 2 tablets (10 mg total) by mouth every 8 (eight) hours as needed. 05/17/19 05/16/20 Yes Epifanio Lesches, MD  pantoprazole (PROTONIX) 40 MG tablet Take 40 mg by mouth 2 (two) times daily.    Yes [provider]  sucralfate (CARAFATE) 1 g tablet Take 1 g by mouth 4 (four) times daily.    Yes [provider]  tamsulosin (FLOMAX) 0.4 MG CAPS capsule Take 1 capsule (0.4 mg total) by mouth daily. 04/05/19  Yes Gladstone Lighter, MD  calcium carbonate (TUMS - DOSED IN MG ELEMENTAL CALCIUM) 500 MG chewable tablet Chew 1 tablet (200  mg of elemental calcium total) by mouth 3 (three) times daily as needed for indigestion or heartburn. 04/25/19   Nicholes Mango, MD  doxycycline (VIBRA-TABS) 100 MG tablet Take 100 mg by mouth 2 (two) times a day. For 17 days 05/08/19   [provider]    Allergies as of 06/10/2019 - Review Complete 06/10/2019  Allergen Reaction Noted  . Metformin and related  04/02/2019  . Motrin [ibuprofen]  04/02/2019    Family History  Family history unknown: Yes    Social History   Socioeconomic History  . Marital status: Divorced    Spouse name: Not on file  . Number of children: Not  on file  . Years of education: Not on file  . Highest education level: Not on file  Occupational History  . Not on file  Social Needs  . Financial resource strain: Not on file  . Food insecurity    Worry: Not on file    Inability: Not on file  . Transportation needs    Medical: Not on file    Non-medical: Not on file  Tobacco Use  . Smoking status: Former Research scientist (life sciences)  . Smokeless tobacco: Never Used  Substance and Sexual Activity  . Alcohol use: Not Currently  . Drug use: Yes    Frequency: 0.5 times per week    Types: Marijuana, Cocaine  . Sexual activity: Not on file  Lifestyle  . Physical activity    Days per week: Not on file    Minutes per session: Not on file  . Stress: Not on file  Relationships  . Social Herbalist on phone: Not on file    Gets together: Not on file    Attends religious service: Not on file    Active member of club or organization: Not on file    Attends meetings of clubs or organizations: Not on file    Relationship status: Not on file  . Intimate partner violence    Fear of current or ex partner: Not on file    Emotionally abused: Not on file    Physically abused: Not on file    Forced sexual activity: Not on file  Other Topics Concern  . Not on file  Social History Narrative  . Not on file    Review of Systems: See HPI, otherwise negative ROS  Physical Exam: BP (!) 146/107   Pulse (!) 104   Temp 98.1 F (36.7 C) (Tympanic)   Resp 20   Ht 5\' 11"  (1.803 m)   Wt 78.9 kg   SpO2 98%   BMI 24.27 kg/m  General:   Alert,  pleasant and cooperative in NAD Head:  Normocephalic and atraumatic. Neck:  Supple; no masses or thyromegaly. Lungs:  Clear throughout to auscultation, normal respiratory effort.    Heart:  +S1, +S2, Regular rate and rhythm, No edema. Abdomen:  Soft, nontender and nondistended. Normal bowel sounds, without guarding, and without rebound.   Neurologic:  Alert and  oriented x4;  grossly normal  neurologically.  Impression/Plan: Kenneth Copeland is here for an endoscopy  to be performed for  evaluation of hematemesis    Risks, benefits, limitations, and alternatives regarding endoscopy have been reviewed with the patient.  Questions have been answered.  All parties agreeable.   Jonathon Bellows, MD  06/12/2019, 10:42 AM

## 2019-06-12 NOTE — Transfer of Care (Signed)
Immediate Anesthesia Transfer of Care Note  Patient: Kenneth Copeland  Procedure(s) Performed: ESOPHAGOGASTRODUODENOSCOPY (EGD) WITH PROPOFOL (N/A )  Patient Location: Endoscopy Unit  Anesthesia Type:General  Level of Consciousness: awake, alert , oriented and patient cooperative  Airway & Oxygen Therapy: Patient Spontanous Breathing and Patient connected to face mask oxygen  Post-op Assessment: Report given to RN and Post -op Vital signs reviewed and stable  Post vital signs: Reviewed and stable  Last Vitals:  Vitals Value Taken Time  BP 117/84 06/12/19 1112  Temp 36.2 C 06/12/19 1112  Pulse 94 06/12/19 1116  Resp 15 06/12/19 1116  SpO2 100 % 06/12/19 1116  Vitals shown include unvalidated device data.  Last Pain:  Vitals:   06/12/19 1112  TempSrc: Tympanic  PainSc:          Complications: No apparent anesthesia complications

## 2019-06-12 NOTE — Anesthesia Preprocedure Evaluation (Signed)
Anesthesia Evaluation  Patient identified by MRN, date of birth, ID band Patient awake    Reviewed: Allergy & Precautions, H&P , NPO status , Patient's Chart, lab work & pertinent test results, reviewed documented beta blocker date and time   History of Anesthesia Complications Negative for: history of anesthetic complications  Airway Mallampati: III  TM Distance: >3 FB Neck ROM: full    Dental  (+) Dental Advidsory Given, Poor Dentition, Missing, Chipped   Pulmonary neg shortness of breath, asthma (as a child) , neg COPD, neg recent URI, former smoker,    Pulmonary exam normal        Cardiovascular Exercise Tolerance: Good hypertension, (-) angina(-) Past MI Normal cardiovascular exam(-) dysrhythmias (-) Valvular Problems/Murmurs     Neuro/Psych negative neurological ROS  negative psych ROS   GI/Hepatic GERD  ,(+)     substance abuse  marijuana use, Liver cancer s/p lobectomy and chemo   Endo/Other  diabetes  Renal/GU negative Renal ROS  negative genitourinary   Musculoskeletal   Abdominal   Peds  Hematology negative hematology ROS (+)   Anesthesia Other Findings Past Medical History: No date: Arthritis No date: Cancer ([place]) No date: Diabetes mellitus without complication ([facility]) No date: Hypertension No date: Liver cancer ([place])   Reproductive/Obstetrics negative OB ROS                             Anesthesia Physical Anesthesia Plan  ASA: III  Anesthesia Plan: General   Post-op Pain Management:    Induction: Intravenous  PONV Risk Score and Plan: 2 and Propofol infusion and TIVA  Airway Management Planned: Natural Airway and Nasal Cannula  Additional Equipment:   Intra-op Plan:   Post-operative Plan:   Informed Consent: I have reviewed the patients History and Physical, chart, labs and discussed the procedure including the risks, benefits and alternatives for the  proposed anesthesia with the patient or authorized representative who has indicated his/her understanding and acceptance.     Dental Advisory Given  Plan Discussed with: Anesthesiologist, CRNA and Surgeon  Anesthesia Plan Comments:         Anesthesia Quick Evaluation

## 2019-06-13 ENCOUNTER — Encounter: Payer: Self-pay | Admitting: Gastroenterology

## 2019-06-13 LAB — GLUCOSE, CAPILLARY
Glucose-Capillary: 160 mg/dL — ABNORMAL HIGH (ref 70–99)
Glucose-Capillary: 190 mg/dL — ABNORMAL HIGH (ref 70–99)
Glucose-Capillary: 288 mg/dL — ABNORMAL HIGH (ref 70–99)
Glucose-Capillary: 262 mg/dL — ABNORMAL HIGH (ref 70–99)

## 2019-06-13 LAB — SURGICAL PATHOLOGY

## 2019-06-13 MED ORDER — METOPROLOL TARTRATE 25 MG PO TABS
12.5000 mg | ORAL_TABLET | Freq: Once | ORAL | Status: AC
  Administered 2019-06-13: 12.5 mg via ORAL
  Filled 2019-06-13: qty 1

## 2019-06-13 MED ORDER — HYDRALAZINE HCL 20 MG/ML IJ SOLN: 10.0000 mg | INTRAMUSCULAR | Status: DC | PRN

## 2019-06-13 MED ORDER — METOPROLOL TARTRATE 25 MG PO TABS
12.5000 mg | ORAL_TABLET | Freq: Two times a day (BID) | ORAL | Status: DC
  Administered 2019-06-13 – 2019-06-14 (×3): 12.5 mg via ORAL
  Filled 2019-06-13 (×3): qty 1

## 2019-06-13 NOTE — Progress Notes (Signed)
[facility] [place] NAME: Kenneth Copeland    MR#:  [id number]  DATE OF BIRTH:  September 27, 1966  SUBJECTIVE:  patient currently Complains of abdominal pain. Wants IV narcotics. He has history of overuse of pain meds in the past.  According to the nurse staff has seen patient putting finger in the mouth to induce vomiting. Patient denies that but doing one time. He threw up his pills when he took altogether after eating some scrambled eggs this morning. Not wanting IV pain meds.  REVIEW OF SYSTEMS:   Review of Systems  Constitutional: Negative for chills, fever and weight loss.  HENT: Negative for ear discharge, ear pain and nosebleeds.   Eyes: Negative for blurred vision, pain and discharge.  Respiratory: Negative for sputum production, shortness of breath, wheezing and stridor.   Cardiovascular: Negative for chest pain, palpitations, orthopnea and PND.  Gastrointestinal: Positive for abdominal pain, heartburn and vomiting. Negative for diarrhea and nausea.  Genitourinary: Negative for frequency and urgency.  Musculoskeletal: Negative for back pain and joint pain.  Neurological: Negative for sensory change, speech change, focal weakness and weakness.  Psychiatric/Behavioral: Negative for depression and hallucinations. The patient is not nervous/anxious.    Tolerating Diet:clears Tolerating PT: ambulatory  DRUG ALLERGIES:   Allergies  Allergen Reactions  . Metformin And Related     Pt. States it makes his stomach bleed because of a stomach ulcer  . Motrin [Ibuprofen]     Pt. States it "inflames his stomach, causes bleeding in stomach"    VITALS:  Blood pressure (!) 160/114, pulse 98, temperature 98.7 F (37.1 C), temperature source Oral, resp. rate 16, height 5\' 11"  (1.803 m), weight 78.9 kg, SpO2 100 %.  PHYSICAL EXAMINATION:   Physical Exam  GENERAL:  53 y.o.-year-old patient lying in the bed with no acute distress.  Disheveled, chronically ill EYES: Pupils equal, round, reactive to light and accommodation. No scleral icterus. Extraocular muscles intact.  HEENT: Head atraumatic, normocephalic. Oropharynx and nasopharynx clear.  NECK:  Supple, no jugular venous distention. No thyroid enlargement, no tenderness.  LUNGS: Normal breath sounds bilaterally, no wheezing, rales, rhonchi. No use of accessory muscles of respiration.  CARDIOVASCULAR: S1, S2 normal. No murmurs, rubs, or gallops.  ABDOMEN: Soft, nontender, nondistended. Bowel sounds present. No organomegaly or mass.  EXTREMITIES: No cyanosis, clubbing or edema b/l.    NEUROLOGIC: Cranial nerves II through XII are intact. No focal Motor or sensory deficits b/l.   PSYCHIATRIC:  patient is alert and oriented x 3.  SKIN: No obvious rash, lesion, or ulcer.   LABORATORY PANEL:  CBC Recent Labs  Lab 06/11/19 1312  06/12/19 0638  WBC 22.8*  --   --   HGB 12.1*   < > 9.9*  HCT 38.4*   < > 31.6*  PLT 368  --   --    < > = values in this interval not displayed.    Chemistries  Recent Labs  Lab 06/10/19 2136 06/11/19 0941  NA 134* 136  K 3.3* 3.4*  CL 97* 102  CO2 21* 22  GLUCOSE 194* 331*  BUN 17 14  CREATININE 1.07 0.94  CALCIUM 9.6 8.7*  AST 25  --   ALT 12  --   ALKPHOS 72  --   BILITOT 0.6  --    Cardiac Enzymes No results for input(s): TROPONINI in the last 168 hours. RADIOLOGY:  No results found. ASSESSMENT AND PLAN:  Kenneth Copeland  is a 53 y.o. male with a known history of diabetes, gastroparesis, hypertension, liver cancer status post partial hepatectomy, pancreatitis, and GI bleed.  He presents to the emergency room complaining of increased epigastric and right upper quadrant abdominal pain described as severe burning which has become worse over the last 12 to 24 hours. Patient has had repeated admissions for GI bleed and similar symptoms.   1. GI bleed with epigastric pain due to peptic ulcer disease - IV Protonix  infusion--- change to IV Protonix BID -  EGD shows two superficial ulcers with esophagitis and large hiatal hernia - Dr Vicente Males gastroenterology, consulted for further evaluation and recommendations -  came in with hemoglobin of 12--- 9.7--9.9 -patient's pain does not go with the findings on his endoscopy. He is been inducing vomiting by putting fingers in his mouth noted by staff couple times. He has had issues with overuse of narcotics in the past. I'm hesitant to give more narcotics than prescribed.  2. gastroparesis, chronic - IV antiemetic - po and prn IV analgesic - Reglan continued  3.  Diabetes mellitus - sliding scale insulin -I will hold off on Lantus and as part with meals secondary to patient's poor PO intake.  4.  History of liver cancer - Patient reports to be in remission having been treated with chemotherapy in [place] prior to relocation  DVT prophylaxis with SCDs and PPI prophylaxis initiated  I have requested staff to keep an eye on to make sure patient does not keep induce vomiting.  Management plans discussed with the patient and RN  CODE STATUS: full  DVT Prophylaxis: SCD  TOTAL TIME TAKING CARE OF THIS PATIENT: *30* minutes.  >50% time spent on counselling and coordination of care  POSSIBLE D/C IN 1-2 DAYS, DEPENDING ON CLINICAL CONDITION.  Note: This dictation was prepared with Dragon dictation along with smaller phrase technology. Any transcriptional errors that result from this process are unintentional.  Fritzi Mandes M.D on 06/13/2019 at 12:35 PM  Between 7am to 6pm - Pager - [phone]  After 6pm go to [email or web address] - password [email or web address] [facility]  Office  [phone]  CC: Primary care physician; Kenneth Copeland, PAPatient ID: Kenneth Copeland, male   DOB: 04-16-1966, 53 y.o.   MRN: [id number]

## 2019-06-14 LAB — GLUCOSE, CAPILLARY
Glucose-Capillary: 210 mg/dL — ABNORMAL HIGH (ref 70–99)
Glucose-Capillary: 350 mg/dL — ABNORMAL HIGH (ref 70–99)

## 2019-06-14 MED ORDER — METOPROLOL TARTRATE 25 MG PO TABS: 12.5000 mg | ORAL_TABLET | Freq: Two times a day (BID) | ORAL | 0 refills | Status: AC

## 2019-06-14 MED ORDER — OXYCODONE HCL 5 MG PO TABS: 10.0000 mg | ORAL_TABLET | Freq: Three times a day (TID) | ORAL | 0 refills | Status: AC | PRN

## 2019-06-14 NOTE — Care Management Important Message (Signed)
Important Message  Patient Details  Name: Kenneth Copeland MRN: [id number] Date of Birth: 06-21-1966   Medicare Important Message Given:  Yes     Juliann Pulse A Jerri Hargadon 06/14/2019, 11:29 AM

## 2019-06-14 NOTE — Discharge Instructions (Signed)
Pt advised NOT to use ASA< NSAIDS like meds

## 2019-06-14 NOTE — TOC Transition Note (Signed)
Transition of Care [facility]) - CM/SW Discharge Note   Patient Details  Name: Parminder Cupples MRN: [id number] Date of Birth: Nov 10, 1966  Transition of Care [facility]) CM/SW Contact:  Shamika Pedregon, Lenice Llamas Phone Number: [phone]  06/14/2019, 2:25 PM   Clinical Narrative: Per RN patient requested a taxi voucher home today. Clinical Education officer, museum (CSW) met with patient to assess transportation needs. Patient reported that he doesn't have a ride home and the hospital has given him 2 taxi vouchers on his last admissions. Patient reported that he lives with his daughter in law in [place] however she is working at [employer] and can't pick him up. CSW asked if his daughter in law can pick him up after she gets off work and he said no. Patient appeared angry and agitated and said that CSW just needs to give him the voucher. Patient then received a phone call from a family member while CSW was in the room. That family member agreed to come pick patient up now from [facility]. Patient reported that he needs his discharge paper work now because his ride is coming shortly. RN aware of above. Please reconsult if future social work needs arise. CSW signing off.       Final next level of care: Home/Self Care Barriers to Discharge: Barriers Resolved   Patient Goals and CMS Choice Patient states their goals for this hospitalization and ongoing recovery are:: To feel better.      Discharge Placement                       Discharge Plan and Services In-house Referral: Clinical Social Work                                   Social Determinants of Health (SDOH) Interventions     Readmission Risk Interventions Readmission Risk Prevention Plan 06/11/2019 04/12/2019  Transportation Screening Complete Complete  Home Care Screening - Complete  Medication Review (RN CM) - Complete  Medication Review (RN Care Manager) Complete -  Palliative Care Screening Not Applicable -

## 2019-06-14 NOTE — Progress Notes (Signed)
Pt d/c to home via friend. IV removed intact. VSS. Education completed. All belongings sent with pt.

## 2019-06-14 NOTE — Discharge Summary (Signed)
[facility] [place] NAME: Kenneth Copeland    MR#:  [id number]  DATE OF BIRTH:  1966/02/10  DATE OF ADMISSION:  06/11/2019 ADMITTING PHYSICIAN: Fritzi Mandes, MD  DATE OF DISCHARGE: 06/14/2019  PRIMARY CARE PHYSICIAN: [name], PA    ADMISSION DIAGNOSIS:  Gastroparesis [K31.84] Hematemesis with nausea [K92.0]  DISCHARGE DIAGNOSIS:  GI bleed due to Peptic ulcer disease  SECONDARY DIAGNOSIS:   Past Medical History:  Diagnosis Date  . Arthritis   . Cancer ([place])   . Diabetes mellitus without complication ([place])   . Hypertension   . Liver cancer [facility])     HOSPITAL COURSE:  WalterSmelseris a53 y.o.malewith a known history of diabetes, gastroparesis, hypertension, liver cancer status post partial hepatectomy, pancreatitis, and GI bleed. He presents to the emergency room complaining of increased epigastric and right upper quadrant abdominal pain described as severe burning which has become worse over the last 12 to 24 hours. Patient has had repeated admissions for GI bleed and similar symptoms.  1.GI bleed with epigastric pain due to peptic ulcer disease -IV Protonix infusion--- change to IV Protonix BID - EGD shows two superficial ulcers with esophagitis and large hiatal hernia -Dr Vicente Males gastroenterology, consulted for further evaluation and recommendations - came in with hemoglobin of 12--- 9.7--9.9 -tolerating po diet  2.gastroparesis, chronic -po and prn IV analgesic -Reglan continued  3. Diabetes mellitus - sliding scale insulin -resume home meds. Sugars high  4. History of liver cancer -Patient reports to be in remission having been treated with chemotherapy in [place] prior to relocation  DVT prophylaxis with SCDs and PPI prophylaxis initiated  Overall a baseline D/c home  CONSULTS OBTAINED:  Treatment Team:  Jonathon Bellows, MD Virgel Manifold, MD  DRUG ALLERGIES:    Allergies  Allergen Reactions  . Metformin And Related     Pt. States it makes his stomach bleed because of a stomach ulcer  . Motrin [Ibuprofen]     Pt. States it "inflames his stomach, causes bleeding in stomach"    DISCHARGE MEDICATIONS:   Allergies as of 06/14/2019      Reactions   Metformin And Related    Pt. States it makes his stomach bleed because of a stomach ulcer   Motrin [ibuprofen]    Pt. States it "inflames his stomach, causes bleeding in stomach"      Medication List    STOP taking these medications   doxycycline 100 MG tablet Commonly known as: VIBRA-TABS     TAKE these medications   acetaminophen 325 MG tablet Commonly known as: TYLENOL Take 650 mg by mouth every 6 (six) hours as needed for mild pain or fever.   calcium carbonate 500 MG chewable tablet Commonly known as: TUMS - dosed in mg elemental calcium Chew 1 tablet (200 mg of elemental calcium total) by mouth 3 (three) times daily as needed for indigestion or heartburn.   colchicine 0.6 MG tablet Take 1 tablet (0.6 mg total) by mouth 2 (two) times daily.   dicyclomine 20 MG tablet Commonly known as: Bentyl Take 1 tablet (20 mg total) by mouth 3 (three) times daily as needed. What changed: Another medication with the same name was removed. Continue taking this medication, and follow the directions you see here.   docusate sodium 100 MG capsule Commonly known as: COLACE Take 1 capsule (100 mg total) by mouth daily as needed for mild constipation.   DULoxetine 30 MG capsule Commonly known  as: Cymbalta Take 1 capsule (30 mg total) by mouth daily.   gabapentin 300 MG capsule Commonly known as: NEURONTIN Take 1 capsule (300 mg total) by mouth 3 (three) times daily.   insulin aspart 100 UNIT/ML injection Commonly known as: novoLOG Inject 5 Units into the skin 3 (three) times daily with meals.   insulin glargine 100 UNIT/ML injection Commonly known as: LANTUS Inject 0.4 mLs (40 Units  total) into the skin 2 (two) times daily.   ipratropium-albuterol 0.5-2.5 (3) MG/3ML Soln Commonly known as: DUONEB Take 3 mLs by nebulization every 6 (six) hours as needed.   metoCLOPramide 10 MG tablet Commonly known as: REGLAN Take 1 tablet (10 mg total) by mouth 4 (four) times daily -  before meals and at bedtime.   metoprolol tartrate 25 MG tablet Commonly known as: LOPRESSOR Take 0.5 tablets (12.5 mg total) by mouth 2 (two) times daily.   multivitamin with minerals Tabs tablet Take 1 tablet by mouth daily.   ondansetron 4 MG tablet Commonly known as: ZOFRAN Take 1 tablet (4 mg total) by mouth every 6 (six) hours as needed for nausea.   oxyCODONE 5 MG immediate release tablet Commonly known as: Roxicodone Take 2 tablets (10 mg total) by mouth every 8 (eight) hours as needed.   pantoprazole 40 MG tablet Commonly known as: PROTONIX Take 40 mg by mouth 2 (two) times daily.   sucralfate 1 g tablet Commonly known as: CARAFATE Take 1 g by mouth 4 (four) times daily.   tamsulosin 0.4 MG Caps capsule Commonly known as: FLOMAX Take 1 capsule (0.4 mg total) by mouth daily.       If you experience worsening of your admission symptoms, develop shortness of breath, life threatening emergency, suicidal or homicidal thoughts you must seek medical attention immediately by calling 911 or calling your MD immediately  if symptoms less severe.  You Must read complete instructions/literature along with all the possible adverse reactions/side effects for all the Medicines you take and that have been prescribed to you. Take any new Medicines after you have completely understood and accept all the possible adverse reactions/side effects.   Please note  You were cared for by a hospitalist during your hospital stay. If you have any questions about your discharge medications or the care you received while you were in the hospital after you are discharged, you can call the unit and asked to  speak with the hospitalist on call if the hospitalist that took care of you is not available. Once you are discharged, your primary care physician will handle any further medical issues. Please note that NO REFILLS for any discharge medications will be authorized once you are discharged, as it is imperative that you return to your primary care physician (or establish a relationship with a primary care physician if you do not have one) for your aftercare needs so that they can reassess your need for medications and monitor your lab values. Today   SUBJECTIVE   Doing well. Tolerating po diet  VITAL SIGNS:  Blood pressure (!) 134/99, pulse 90, temperature 98.5 F (36.9 C), resp. rate 19, height 5\' 11"  (1.803 m), weight 78.9 kg, SpO2 100 %.  I/O:    Intake/Output Summary (Last 24 hours) at 06/14/2019 1249 Last data filed at 06/14/2019 0900 Gross per 24 hour  Intake 840 ml  Output 1100 ml  Net -260 ml    PHYSICAL EXAMINATION:  GENERAL:  53 y.o.-year-old patient lying in the bed with no acute  distress.  EYES: Pupils equal, round, reactive to light and accommodation. No scleral icterus. Extraocular muscles intact.  HEENT: Head atraumatic, normocephalic. Oropharynx and nasopharynx clear.  NECK:  Supple, no jugular venous distention. No thyroid enlargement, no tenderness.  LUNGS: Normal breath sounds bilaterally, no wheezing, rales,rhonchi or crepitation. No use of accessory muscles of respiration.  CARDIOVASCULAR: S1, S2 normal. No murmurs, rubs, or gallops.  ABDOMEN: Soft, non-tender, non-distended. Bowel sounds present. No organomegaly or mass.  EXTREMITIES: No pedal edema, cyanosis, or clubbing.  NEUROLOGIC: Cranial nerves II through XII are intact. Muscle strength 5/5 in all extremities. Sensation intact. Gait not checked.  PSYCHIATRIC: The patient is alert and oriented x 3.  SKIN: No obvious rash, lesion, or ulcer.   DATA REVIEW:   CBC  Recent Labs  Lab 06/11/19 1312   06/12/19 0638  WBC 22.8*  --   --   HGB 12.1*   < > 9.9*  HCT 38.4*   < > 31.6*  PLT 368  --   --    < > = values in this interval not displayed.    Chemistries  Recent Labs  Lab 06/10/19 2136 06/11/19 0941  NA 134* 136  K 3.3* 3.4*  CL 97* 102  CO2 21* 22  GLUCOSE 194* 331*  BUN 17 14  CREATININE 1.07 0.94  CALCIUM 9.6 8.7*  AST 25  --   ALT 12  --   ALKPHOS 72  --   BILITOT 0.6  --     Microbiology Results   Recent Results (from the past 240 hour(s))  SARS Coronavirus 2 (CEPHEID - Performed in [place] hospital lab), Hosp Order     Status: None   Collection Time: 06/11/19  5:49 AM   Specimen: Nasopharyngeal Swab  Result Value Ref Range Status   SARS Coronavirus 2 NEGATIVE NEGATIVE Final    Comment: (NOTE) If result is NEGATIVE SARS-CoV-2 target nucleic acids are NOT DETECTED. The SARS-CoV-2 RNA is generally detectable in upper and lower  respiratory specimens during the acute phase of infection. The lowest  concentration of SARS-CoV-2 viral copies this assay can detect is 250  copies / mL. A negative result does not preclude SARS-CoV-2 infection  and should not be used as the sole basis for treatment or other  patient management decisions.  A negative result may occur with  improper specimen collection / handling, submission of specimen other  than nasopharyngeal swab, presence of viral mutation(s) within the  areas targeted by this assay, and inadequate number of viral copies  (<250 copies / mL). A negative result must be combined with clinical  observations, patient history, and epidemiological information. If result is POSITIVE SARS-CoV-2 target nucleic acids are DETECTED. The SARS-CoV-2 RNA is generally detectable in upper and lower  respiratory specimens dur ing the acute phase of infection.  Positive  results are indicative of active infection with SARS-CoV-2.  Clinical  correlation with patient history and other diagnostic information is  necessary  to determine patient infection status.  Positive results do  not rule out bacterial infection or co-infection with other viruses. If result is PRESUMPTIVE POSTIVE SARS-CoV-2 nucleic acids MAY BE PRESENT.   A presumptive positive result was obtained on the submitted specimen  and confirmed on repeat testing.  While 2019 novel coronavirus  (SARS-CoV-2) nucleic acids may be present in the submitted sample  additional confirmatory testing may be necessary for epidemiological  and / or clinical management purposes  to differentiate between  SARS-CoV-2 and  other Sarbecovirus currently known to infect humans.  If clinically indicated additional testing with an alternate test  methodology [phone]) is advised. The SARS-CoV-2 RNA is generally  detectable in upper and lower respiratory sp ecimens during the acute  phase of infection. The expected result is Negative. Fact Sheet for Patients:  StrictlyIdeas.no Fact Sheet for Healthcare Providers: BankingDealers.co.za This test is not yet approved or cleared by the [place] FDA and has been authorized for detection and/or diagnosis of SARS-CoV-2 by FDA under an Emergency Use Authorization (EUA).  This EUA will remain in effect (meaning this test can be used) for the duration of the COVID-19 declaration under Section 564(b)(1) of the Act, 21 U.S.C. section 360bbb-3(b)(1), unless the authorization is terminated or revoked sooner. Performed at [facility], [address] [place], [place] [address]     RADIOLOGY:  No results found.   CODE STATUS:     Code Status Orders  (From admission, onward)         Start     Ordered   06/11/19 1254  Full code  Continuous     06/11/19 1253        Code Status History    Date Active Date Inactive Code Status Order ID Comments User Context   05/24/2019 2143 05/26/2019 1609 Full Code [id number]  Mayer Camel, NP ED   05/21/2019 0816  05/22/2019 1854 Full Code [id number]  Harrie Foreman, MD ED   05/13/2019 0419 05/17/2019 1827 Full Code [id number]  Mayer Camel, NP ED   04/08/2019 1224 04/25/2019 2346 Full Code [id number]  Henreitta Leber, MD Inpatient   04/02/2019 1332 04/05/2019 1916 Full Code [id number]  Lang Snow, NP ED   Advance Care Planning Activity      TOTAL TIME TAKING CARE OF THIS PATIENT: 40 minutes.    Fritzi Mandes M.D on 06/14/2019 at 12:49 PM  Between 7am to 6pm - Pager - [phone] After 6pm go to [email or web address] - password [email or web address] [facility]  Office  [phone]  CC: Primary care physician; [name], PA

## 2019-06-15 ENCOUNTER — Observation Stay
Admission: EM | Admit: 2019-06-15 | Discharge: 2019-06-17 | Disposition: A | Discharge status: Home / Self Care | Payer: Medicare Other | Attending: Specialist | Admitting: Specialist

## 2019-06-15 ENCOUNTER — Other Ambulatory Visit: Payer: Self-pay

## 2019-06-15 ENCOUNTER — Emergency Department: Payer: Medicare Other

## 2019-06-15 DIAGNOSIS — E876 Hypokalemia: Secondary | ICD-10-CM | POA: Diagnosis present

## 2019-06-15 DIAGNOSIS — Z9221 Personal history of antineoplastic chemotherapy: Secondary | ICD-10-CM | POA: Diagnosis not present

## 2019-06-15 DIAGNOSIS — I10 Essential (primary) hypertension: Secondary | ICD-10-CM | POA: Insufficient documentation

## 2019-06-15 DIAGNOSIS — K3184 Gastroparesis: Secondary | ICD-10-CM | POA: Diagnosis present

## 2019-06-15 DIAGNOSIS — K209 Esophagitis, unspecified without bleeding: Secondary | ICD-10-CM

## 2019-06-15 DIAGNOSIS — E1143 Type 2 diabetes mellitus with diabetic autonomic (poly)neuropathy: Secondary | ICD-10-CM | POA: Diagnosis not present

## 2019-06-15 DIAGNOSIS — Z8711 Personal history of peptic ulcer disease: Secondary | ICD-10-CM | POA: Diagnosis not present

## 2019-06-15 DIAGNOSIS — E119 Type 2 diabetes mellitus without complications: Secondary | ICD-10-CM

## 2019-06-15 DIAGNOSIS — R112 Nausea with vomiting, unspecified: Principal | ICD-10-CM | POA: Insufficient documentation

## 2019-06-15 DIAGNOSIS — R338 Other retention of urine: Secondary | ICD-10-CM | POA: Insufficient documentation

## 2019-06-15 DIAGNOSIS — M1990 Unspecified osteoarthritis, unspecified site: Secondary | ICD-10-CM | POA: Diagnosis not present

## 2019-06-15 DIAGNOSIS — Z794 Long term (current) use of insulin: Secondary | ICD-10-CM | POA: Diagnosis not present

## 2019-06-15 DIAGNOSIS — K279 Peptic ulcer, site unspecified, unspecified as acute or chronic, without hemorrhage or perforation: Secondary | ICD-10-CM | POA: Diagnosis not present

## 2019-06-15 DIAGNOSIS — N401 Enlarged prostate with lower urinary tract symptoms: Secondary | ICD-10-CM | POA: Diagnosis not present

## 2019-06-15 DIAGNOSIS — F329 Major depressive disorder, single episode, unspecified: Secondary | ICD-10-CM | POA: Insufficient documentation

## 2019-06-15 DIAGNOSIS — Z886 Allergy status to analgesic agent status: Secondary | ICD-10-CM | POA: Diagnosis not present

## 2019-06-15 DIAGNOSIS — Z8505 Personal history of malignant neoplasm of liver: Secondary | ICD-10-CM | POA: Diagnosis not present

## 2019-06-15 DIAGNOSIS — Z87891 Personal history of nicotine dependence: Secondary | ICD-10-CM | POA: Insufficient documentation

## 2019-06-15 DIAGNOSIS — Z79899 Other long term (current) drug therapy: Secondary | ICD-10-CM | POA: Diagnosis not present

## 2019-06-15 DIAGNOSIS — Z1159 Encounter for screening for other viral diseases: Secondary | ICD-10-CM | POA: Diagnosis not present

## 2019-06-15 DIAGNOSIS — M109 Gout, unspecified: Secondary | ICD-10-CM | POA: Diagnosis not present

## 2019-06-15 DIAGNOSIS — R1010 Upper abdominal pain, unspecified: Secondary | ICD-10-CM | POA: Insufficient documentation

## 2019-06-15 LAB — COMPREHENSIVE METABOLIC PANEL
ALT: 10 U/L (ref 0–44)
AST: 18 U/L (ref 15–41)
Albumin: 4.2 g/dL (ref 3.5–5.0)
Alkaline Phosphatase: 75 U/L (ref 38–126)
Anion gap: 13 (ref 5–15)
BUN: 8 mg/dL (ref 6–20)
CO2: 24 mmol/L (ref 22–32)
Calcium: 9.5 mg/dL (ref 8.9–10.3)
Chloride: 98 mmol/L (ref 98–111)
Creatinine, Ser: 0.87 mg/dL (ref 0.61–1.24)
GFR calc Af Amer: >60 mL/min (ref >60)
GFR calc non Af Amer: >60 mL/min (ref >60)
Glucose, Bld: 114 mg/dL — ABNORMAL HIGH (ref 70–99)
Potassium: 3.1 mmol/L — ABNORMAL LOW (ref 3.5–5.1)
Sodium: 135 mmol/L (ref 135–145)
Total Bilirubin: 1 mg/dL (ref 0.3–1.2)
Total Protein: 7.8 g/dL (ref 6.5–8.1)

## 2019-06-15 LAB — CBC WITH DIFFERENTIAL/PLATELET
Abs Immature Granulocytes: 0.04 10*3/uL (ref 0.00–0.07)
Basophils Absolute: 0 10*3/uL (ref 0.0–0.1)
Basophils Relative: 0 %
Eosinophils Absolute: 0 10*3/uL (ref 0.0–0.5)
Eosinophils Relative: 0 %
HCT: 32.8 % — ABNORMAL LOW (ref 39.0–52.0)
Hemoglobin: 10.6 g/dL — ABNORMAL LOW (ref 13.0–17.0)
Immature Granulocytes: 0 %
Lymphocytes Relative: 16 %
Lymphs Abs: 1.8 10*3/uL (ref 0.7–4.0)
MCH: 25.3 pg — ABNORMAL LOW (ref 26.0–34.0)
MCHC: 32.3 g/dL (ref 30.0–36.0)
MCV: 78.3 fL — ABNORMAL LOW (ref 80.0–100.0)
Monocytes Absolute: 1.1 10*3/uL — ABNORMAL HIGH (ref 0.1–1.0)
Monocytes Relative: 10 %
Neutro Abs: 8.2 10*3/uL — ABNORMAL HIGH (ref 1.7–7.7)
Neutrophils Relative %: 74 %
Platelets: 293 10*3/uL (ref 150–400)
RBC: 4.19 MIL/uL — ABNORMAL LOW (ref 4.22–5.81)
RDW: 16.7 % — ABNORMAL HIGH (ref 11.5–15.5)
WBC: 11.3 10*3/uL — ABNORMAL HIGH (ref 4.0–10.5)
nRBC: 0 % (ref 0.0–0.2)

## 2019-06-15 LAB — LIPASE, BLOOD: Lipase: 19 U/L (ref 11–51)

## 2019-06-15 LAB — ETHANOL: Alcohol, Ethyl (B): <10 mg/dL (ref <10)

## 2019-06-15 MED ORDER — SODIUM CHLORIDE 0.9 % IV BOLUS
1000.0000 mL | Freq: Once | INTRAVENOUS | Status: AC
  Administered 2019-06-16: 1000 mL via INTRAVENOUS

## 2019-06-15 MED ORDER — METOCLOPRAMIDE HCL 5 MG/ML IJ SOLN
10.0000 mg | Freq: Once | INTRAMUSCULAR | Status: AC
  Administered 2019-06-16: 10 mg via INTRAVENOUS
  Filled 2019-06-15: qty 2

## 2019-06-15 MED ORDER — FAMOTIDINE IN NACL 20-0.9 MG/50ML-% IV SOLN
20.0000 mg | Freq: Once | INTRAVENOUS | Status: AC
  Administered 2019-06-16: 20 mg via INTRAVENOUS
  Filled 2019-06-15: qty 50

## 2019-06-15 NOTE — ED Provider Notes (Signed)
[facility] Emergency Department Provider Note   ____________________________________________   First MD Initiated Contact with Patient 06/15/19 2318     (approximate)  I have reviewed the triage vital signs and the nursing notes.   HISTORY  Chief Complaint Abdominal Pain    HPI Kenneth Copeland is a 53 y.o. male brought to the ED from home via EMS with a chief complaint of abdominal pain, nausea and vomiting.  Patient recently moved to the area and has had a number of ED visits and hospitalizations for chronic abdominal pain, intractable vomiting and nausea and gastroparesis.  Had an endoscopy on 7/21 which revealed gastric ulcer, gastritis, hiatal hernia and esophagitis.  Did not pick up any of the prescriptions prescribed to him.  Complains of his typical upper abdominal pain, nausea and vomiting.  Denies fever, cough, chest pain, shortness of breath.  Had a negative COVID swab on 7/20.       Past Medical History:  Diagnosis Date  . Arthritis   . Cancer ([place])   . Diabetes mellitus without complication ([place])   . Hypertension   . Liver cancer [facility])     Patient Active Problem List   Diagnosis Date Noted  . Intractable vomiting with nausea 06/11/2019  . GI bleed 06/11/2019  . Intractable nausea and vomiting 05/21/2019  . Gastroparesis due to DM ([place]) 05/12/2019  . Acute respiratory failure ([place])   . Chest pain 04/08/2019  . Pericardial effusion 04/02/2019    Past Surgical History:  Procedure Laterality Date  . ESOPHAGOGASTRODUODENOSCOPY (EGD) WITH PROPOFOL N/A 06/12/2019   Procedure: ESOPHAGOGASTRODUODENOSCOPY (EGD) WITH PROPOFOL;  Surgeon: Jonathon Bellows, MD;  Location: [facility] ENDOSCOPY;  Service: Gastroenterology;  Laterality: N/A;  . HERNIA REPAIR    . LIVER LOBECTOMY    . TEE WITHOUT CARDIOVERSION N/A 04/11/2019   Procedure: TRANSESOPHAGEAL ECHOCARDIOGRAM (TEE);  Surgeon: Corey Skains, MD;  Location: [facility] ORS;  Service: Cardiovascular;   Laterality: N/A;    Prior to Admission medications   Medication Sig Start Date End Date Taking? Authorizing Provider  acetaminophen (TYLENOL) 325 MG tablet Take 650 mg by mouth every 6 (six) hours as needed for mild pain or fever.    [provider]  calcium carbonate (TUMS - DOSED IN MG ELEMENTAL CALCIUM) 500 MG chewable tablet Chew 1 tablet (200 mg of elemental calcium total) by mouth 3 (three) times daily as needed for indigestion or heartburn. 04/25/19   Nicholes Mango, MD  colchicine 0.6 MG tablet Take 1 tablet (0.6 mg total) by mouth 2 (two) times daily. 04/05/19   Gladstone Lighter, MD  dicyclomine (BENTYL) 20 MG tablet Take 1 tablet (20 mg total) by mouth 3 (three) times daily as needed. 05/29/19   Nance Pear, MD  docusate sodium (COLACE) 100 MG capsule Take 1 capsule (100 mg total) by mouth daily as needed for mild constipation. 04/25/19   Gouru, Illene Silver, MD  DULoxetine (CYMBALTA) 30 MG capsule Take 1 capsule (30 mg total) by mouth daily. 04/05/19 07/04/19  Gladstone Lighter, MD  gabapentin (NEURONTIN) 300 MG capsule Take 1 capsule (300 mg total) by mouth 3 (three) times daily. 04/05/19   Gladstone Lighter, MD  insulin aspart (NOVOLOG) 100 UNIT/ML injection Inject 5 Units into the skin 3 (three) times daily with meals. 04/25/19   Gouru, Illene Silver, MD  insulin glargine (LANTUS) 100 UNIT/ML injection Inject 0.4 mLs (40 Units total) into the skin 2 (two) times daily. 05/26/19   Demetrios Loll, MD  ipratropium-albuterol (DUONEB) 0.5-2.5 (3) MG/3ML  SOLN Take 3 mLs by nebulization every 6 (six) hours as needed. 04/25/19   Nicholes Mango, MD  metoCLOPramide (REGLAN) 10 MG tablet Take 1 tablet (10 mg total) by mouth 4 (four) times daily -  before meals and at bedtime. 05/22/19   Hillary Bow, MD  metoprolol tartrate (LOPRESSOR) 25 MG tablet Take 0.5 tablets (12.5 mg total) by mouth 2 (two) times daily. 06/14/19   Fritzi Mandes, MD  Multiple Vitamin (MULTIVITAMIN WITH MINERALS) TABS tablet Take 1 tablet by mouth  daily. 04/26/19   Gouru, Illene Silver, MD  ondansetron (ZOFRAN) 4 MG tablet Take 1 tablet (4 mg total) by mouth every 6 (six) hours as needed for nausea. 05/22/19   Hillary Bow, MD  oxyCODONE (ROXICODONE) 5 MG immediate release tablet Take 2 tablets (10 mg total) by mouth every 8 (eight) hours as needed. 06/14/19   Fritzi Mandes, MD  pantoprazole (PROTONIX) 40 MG tablet Take 40 mg by mouth 2 (two) times daily.     [provider]  sucralfate (CARAFATE) 1 g tablet Take 1 g by mouth 4 (four) times daily.     [provider]  tamsulosin (FLOMAX) 0.4 MG CAPS capsule Take 1 capsule (0.4 mg total) by mouth daily. 04/05/19   Gladstone Lighter, MD    Allergies Metformin and related and Motrin [ibuprofen]  Family History  Family history unknown: Yes    Social History Social History   Tobacco Use  . Smoking status: Former Research scientist (life sciences)  . Smokeless tobacco: Never Used  Substance Use Topics  . Alcohol use: Not Currently  . Drug use: Yes    Frequency: 0.5 times per week    Types: Marijuana, Cocaine    Review of Systems  Constitutional: No fever/chills Eyes: No visual changes. ENT: No sore throat. Cardiovascular: Denies chest pain. Respiratory: Denies shortness of breath. Gastrointestinal: Positive for abdominal pain, nausea and vomiting.  No diarrhea.  No constipation. Genitourinary: Negative for dysuria. Musculoskeletal: Negative for back pain. Skin: Negative for rash. Neurological: Negative for headaches, focal weakness or numbness.   ____________________________________________   PHYSICAL EXAM:  VITAL SIGNS: ED Triage Vitals  Enc Vitals Group     BP 06/15/19 2201 (!) 177/116     Pulse Rate 06/15/19 2201 (!) 123     Resp 06/15/19 2201 20     Temp 06/15/19 2201 97.7 F (36.5 C)     Temp Source 06/15/19 2201 Oral     SpO2 06/15/19 2201 100 %     Weight 06/15/19 2202 174 lb (78.9 kg)     Height 06/15/19 2202 5\' 11"  (1.803 m)     Head Circumference --      Peak Flow --       Pain Score 06/15/19 2202 9     Pain Loc --      Pain Edu? --      Excl. in [place]? --     Constitutional: Alert and oriented.  Chronically ill appearing and in mild to moderate acute distress. Eyes: Conjunctivae are normal. PERRL. EOMI. Head: Atraumatic. Nose: No congestion/rhinnorhea. Mouth/Throat: Mucous membranes are mildly dry.  Oropharynx non-erythematous. Neck: No stridor.   Cardiovascular: Normal rate, regular rhythm. Grossly normal heart sounds.  Good peripheral circulation. Respiratory: Normal respiratory effort.  No retractions. Lungs CTAB. Gastrointestinal: Soft and mildly tender to palpation upper abdomen without rebound or guarding.. No distention. No abdominal bruits. No CVA tenderness. Musculoskeletal: No lower extremity tenderness nor edema.  No joint effusions. Neurologic:  Normal speech and language. No gross  focal neurologic deficits are appreciated.  Skin:  Skin is pale, warm, dry and intact. No rash noted. Psychiatric: Mood and affect are normal. Speech and behavior are normal.  ____________________________________________   LABS (all labs ordered are listed, but only abnormal results are displayed)  Labs Reviewed  CBC WITH DIFFERENTIAL/PLATELET - Abnormal; Notable for the following components:      Result Value   WBC 11.3 (*)    RBC 4.19 (*)    Hemoglobin 10.6 (*)    HCT 32.8 (*)    MCV 78.3 (*)    MCH 25.3 (*)    RDW 16.7 (*)    Neutro Abs 8.2 (*)    Monocytes Absolute 1.1 (*)    All other components within normal limits  COMPREHENSIVE METABOLIC PANEL - Abnormal; Notable for the following components:   Potassium 3.1 (*)    Glucose, Bld 114 (*)    All other components within normal limits  ETHANOL  LIPASE, BLOOD   ____________________________________________  EKG  ED ECG REPORT I, , J, the attending physician, personally viewed and interpreted this ECG.   Date: 06/15/2019  EKG Time: 2203  Rate: 123  Rhythm: sinus tachycardia   Axis: LAD  Intervals:left anterior fascicular block  ST&T Change: Nonspecific  ____________________________________________  RADIOLOGY  ED MD interpretation: No acute cardiopulmonary process  Official radiology report(s): Dg Chest Port 1 View  Result Date: 06/16/2019 CLINICAL DATA:  Recent EGD with abdominal pain. EXAM: PORTABLE CHEST 1 VIEW COMPARISON:  Apr 10, 2019 FINDINGS: The heart size and mediastinal contours are within normal limits. Both lungs are clear. The visualized skeletal structures are unremarkable. IMPRESSION: No active disease. Electronically Signed   By: Constance Holster M.D.   On: 06/16/2019 00:00    ____________________________________________   PROCEDURES  Procedure(s) performed (including Critical Care):  Procedures  CRITICAL CARE Performed by: Paulette Blanch   Total critical care time: 30 minutes  Critical care time was exclusive of separately billable procedures and treating other patients.  Critical care was necessary to treat or prevent imminent or life-threatening deterioration.  Critical care was time spent personally by me on the following activities: development of treatment plan with patient and/or surrogate as well as nursing, discussions with consultants, evaluation of patient's response to treatment, examination of patient, obtaining history from patient or surrogate, ordering and performing treatments and interventions, ordering and review of laboratory studies, ordering and review of radiographic studies, pulse oximetry and re-evaluation of patient's condition. ____________________________________________   INITIAL IMPRESSION / ASSESSMENT AND PLAN / ED COURSE  As part of my medical decision making, I reviewed the following data within the [place] notes reviewed and incorporated, Labs reviewed, EKG interpreted, Old chart reviewed, Radiograph reviewed, Discussed with admitting physician and Notes from prior ED  visits     Hilberto Burzynski was evaluated in Emergency Department on 06/16/2019 for the symptoms described in the history of present illness. He was evaluated in the context of the global COVID-19 pandemic, which necessitated consideration that the patient might be at risk for infection with the SARS-CoV-2 virus that causes COVID-19. Institutional protocols and algorithms that pertain to the evaluation of patients at risk for COVID-19 are in a state of rapid change based on information released by regulatory bodies including the CDC and federal and state organizations. These policies and algorithms were followed during the patient's care in the ED.   53 year old male with frequent hospitalizations for intractable nausea/vomiting secondary to gastroparesis who presents with  abdominal pain, nausea and vomiting. Differential diagnosis includes, but is not limited to, biliary disease (biliary colic, acute cholecystitis, cholangitis, choledocholithiasis, etc), intrathoracic causes for epigastric abdominal pain including ACS, gastritis, duodenitis, pancreatitis, small bowel or large bowel obstruction, abdominal aortic aneurysm, hernia, and ulcer(s).  Will obtain screening lab work, chest x-ray to evaluate for perforation.  Initiate aggressive IV fluids for tachycardia, IV Reglan for nausea/vomiting, IV Pepcid for esophagitis.  Anticipate hospitalization.  Patient just had negative COVID test 4 days ago, do not feel need to repeat.  Clinical Course as of Jun 15 113  Sat Jun 16, 2019  0114 Discussed with hospitalist Dr. Jannifer Franklin who will evaluate patient in the emergency department for admission.   [JS]    Clinical Course User Index [JS] Paulette Blanch, MD     ____________________________________________   FINAL CLINICAL IMPRESSION(S) / ED DIAGNOSES  Final diagnoses:  Pain of upper abdomen  Intractable vomiting with nausea, unspecified vomiting type  Esophagitis  Gastroparesis  Hypokalemia     ED  Discharge Orders    None       Note:  This document was prepared using Dragon voice recognition software and may include unintentional dictation errors.   Paulette Blanch, MD 06/16/19 [phone]

## 2019-06-15 NOTE — ED Triage Notes (Signed)
abd pain and n/v today. Patient had endoscopy done on 7/21. Dx with hiatal hernia and ulcers. Did not pick up any of the rx's sent to his pharmacy. Presents with c/o of abd pain. zofram given in route to ed.

## 2019-06-16 LAB — GLUCOSE, CAPILLARY
Glucose-Capillary: 164 mg/dL — ABNORMAL HIGH (ref 70–99)
Glucose-Capillary: 217 mg/dL — ABNORMAL HIGH (ref 70–99)
Glucose-Capillary: 326 mg/dL — ABNORMAL HIGH (ref 70–99)
Glucose-Capillary: 253 mg/dL — ABNORMAL HIGH (ref 70–99)

## 2019-06-16 LAB — TSH: TSH: 1.202 u[IU]/mL (ref 0.350–4.500)

## 2019-06-16 MED ORDER — INSULIN ASPART 100 UNIT/ML ~~LOC~~ SOLN
0.0000 [IU] | Freq: Three times a day (TID) | SUBCUTANEOUS | Status: DC
  Administered 2019-06-16: 3 [IU] via SUBCUTANEOUS
  Administered 2019-06-16: 10:00:00 2 [IU] via SUBCUTANEOUS
  Administered 2019-06-16: 7 [IU] via SUBCUTANEOUS
  Administered 2019-06-17: 5 [IU] via SUBCUTANEOUS
  Filled 2019-06-16 (×4): qty 1

## 2019-06-16 MED ORDER — POTASSIUM CHLORIDE 10 MEQ/100ML IV SOLN
10.0000 meq | Freq: Once | INTRAVENOUS | Status: AC
  Administered 2019-06-16: 10 meq via INTRAVENOUS
  Filled 2019-06-16: qty 100

## 2019-06-16 MED ORDER — ONDANSETRON HCL 4 MG/2ML IJ SOLN: 4.0000 mg | Freq: Four times a day (QID) | INTRAMUSCULAR | Status: DC | PRN

## 2019-06-16 MED ORDER — POLYETHYLENE GLYCOL 3350 17 G PO PACK: 17.0000 g | PACK | Freq: Every day | ORAL | Status: DC | PRN

## 2019-06-16 MED ORDER — FENTANYL CITRATE (PF) 100 MCG/2ML IJ SOLN
50.0000 ug | Freq: Once | INTRAMUSCULAR | Status: AC
  Administered 2019-06-16: 50 ug via INTRAVENOUS
  Filled 2019-06-16: qty 2

## 2019-06-16 MED ORDER — PANTOPRAZOLE SODIUM 40 MG PO TBEC
40.0000 mg | DELAYED_RELEASE_TABLET | Freq: Two times a day (BID) | ORAL | Status: DC
  Administered 2019-06-16 – 2019-06-17 (×2): 40 mg via ORAL
  Filled 2019-06-16 (×3): qty 1

## 2019-06-16 MED ORDER — MORPHINE SULFATE (PF) 2 MG/ML IV SOLN: 2.0000 mg | INTRAVENOUS | Status: DC | PRN

## 2019-06-16 MED ORDER — DULOXETINE HCL 30 MG PO CPEP
30.0000 mg | ORAL_CAPSULE | Freq: Every day | ORAL | Status: DC
  Administered 2019-06-17: 08:00:00 30 mg via ORAL
  Filled 2019-06-16 (×2): qty 1

## 2019-06-16 MED ORDER — CALCIUM CARBONATE ANTACID 500 MG PO CHEW: 1.0000 | CHEWABLE_TABLET | Freq: Three times a day (TID) | ORAL | Status: DC | PRN

## 2019-06-16 MED ORDER — PROCHLORPERAZINE EDISYLATE 10 MG/2ML IJ SOLN
10.0000 mg | Freq: Four times a day (QID) | INTRAMUSCULAR | Status: DC | PRN
  Filled 2019-06-16: qty 2

## 2019-06-16 MED ORDER — TAMSULOSIN HCL 0.4 MG PO CAPS
0.4000 mg | ORAL_CAPSULE | Freq: Every day | ORAL | Status: DC
  Administered 2019-06-17: 08:00:00 0.4 mg via ORAL
  Filled 2019-06-16 (×2): qty 1

## 2019-06-16 MED ORDER — ADULT MULTIVITAMIN W/MINERALS CH
1.0000 | ORAL_TABLET | Freq: Every day | ORAL | Status: DC
  Administered 2019-06-17: 1 via ORAL
  Filled 2019-06-16 (×2): qty 1

## 2019-06-16 MED ORDER — COLCHICINE 0.6 MG PO TABS
0.6000 mg | ORAL_TABLET | Freq: Two times a day (BID) | ORAL | Status: DC
  Administered 2019-06-16 – 2019-06-17 (×2): 0.6 mg via ORAL
  Filled 2019-06-16 (×4): qty 1

## 2019-06-16 MED ORDER — METOPROLOL TARTRATE 25 MG PO TABS
12.5000 mg | ORAL_TABLET | Freq: Two times a day (BID) | ORAL | Status: DC
  Administered 2019-06-16 – 2019-06-17 (×2): 12.5 mg via ORAL
  Filled 2019-06-16 (×3): qty 1

## 2019-06-16 MED ORDER — GABAPENTIN 300 MG PO CAPS
300.0000 mg | ORAL_CAPSULE | Freq: Three times a day (TID) | ORAL | Status: DC
  Administered 2019-06-16 – 2019-06-17 (×2): 300 mg via ORAL
  Filled 2019-06-16 (×4): qty 1

## 2019-06-16 MED ORDER — METOCLOPRAMIDE HCL 5 MG PO TABS
10.0000 mg | ORAL_TABLET | Freq: Three times a day (TID) | ORAL | Status: DC
  Administered 2019-06-16 – 2019-06-17 (×4): 10 mg via ORAL
  Filled 2019-06-16 (×6): qty 2

## 2019-06-16 MED ORDER — INSULIN GLARGINE 100 UNIT/ML ~~LOC~~ SOLN
20.0000 [IU] | Freq: Every day | SUBCUTANEOUS | Status: DC
  Administered 2019-06-16: 20 [IU] via SUBCUTANEOUS
  Filled 2019-06-16 (×2): qty 0.2

## 2019-06-16 MED ORDER — ONDANSETRON HCL 4 MG PO TABS: 4.0000 mg | ORAL_TABLET | Freq: Four times a day (QID) | ORAL | Status: DC | PRN

## 2019-06-16 MED ORDER — DOCUSATE SODIUM 100 MG PO CAPS: 100.0000 mg | ORAL_CAPSULE | Freq: Every day | ORAL | Status: DC | PRN

## 2019-06-16 MED ORDER — IPRATROPIUM-ALBUTEROL 0.5-2.5 (3) MG/3ML IN SOLN: 3.0000 mL | Freq: Four times a day (QID) | RESPIRATORY_TRACT | Status: DC | PRN

## 2019-06-16 MED ORDER — ACETAMINOPHEN 325 MG PO TABS: 650.0000 mg | ORAL_TABLET | Freq: Four times a day (QID) | ORAL | Status: DC | PRN

## 2019-06-16 MED ORDER — ACETAMINOPHEN 650 MG RE SUPP: 650.0000 mg | Freq: Four times a day (QID) | RECTAL | Status: DC | PRN

## 2019-06-16 MED ORDER — OXYCODONE-ACETAMINOPHEN 5-325 MG PO TABS
1.0000 | ORAL_TABLET | Freq: Four times a day (QID) | ORAL | Status: DC | PRN
  Administered 2019-06-16 – 2019-06-17 (×2): 1 via ORAL
  Filled 2019-06-16 (×3): qty 1

## 2019-06-16 MED ORDER — OXYCODONE HCL 5 MG PO TABS: 10.0000 mg | ORAL_TABLET | Freq: Three times a day (TID) | ORAL | Status: DC | PRN

## 2019-06-16 MED ORDER — HALOPERIDOL LACTATE 5 MG/ML IJ SOLN
5.0000 mg | Freq: Once | INTRAMUSCULAR | Status: AC
  Administered 2019-06-16: 5 mg via INTRAVENOUS
  Filled 2019-06-16: qty 1

## 2019-06-16 MED ORDER — SUCRALFATE 1 G PO TABS
1.0000 g | ORAL_TABLET | Freq: Four times a day (QID) | ORAL | Status: DC
  Administered 2019-06-16 – 2019-06-17 (×3): 1 g via ORAL
  Filled 2019-06-16 (×5): qty 1

## 2019-06-16 MED ORDER — POTASSIUM CHLORIDE CRYS ER 20 MEQ PO TBCR
40.0000 meq | EXTENDED_RELEASE_TABLET | ORAL | Status: AC
  Filled 2019-06-16 (×2): qty 2

## 2019-06-16 NOTE — Progress Notes (Signed)
[name] [place] [facility] NAME: Kenneth Copeland    MR#:  [id number]  DATE OF BIRTH:  1966-08-09  SUBJECTIVE:   Patient presents to the hospital due to abdominal pain.  Just discharged 2 days ago from the hospital for similar symptoms.  Patient says he cannot take anything by mouth and had not picked up his prescriptions after discharge.  REVIEW OF SYSTEMS:    Review of Systems  Constitutional: Negative for chills and fever.  HENT: Negative for congestion and tinnitus.   Eyes: Negative for blurred vision and double vision.  Respiratory: Negative for cough, shortness of breath and wheezing.   Cardiovascular: Negative for chest pain, orthopnea and PND.  Gastrointestinal: Positive for abdominal pain and nausea. Negative for diarrhea and vomiting.  Genitourinary: Negative for dysuria and hematuria.  Neurological: Negative for dizziness, sensory change and focal weakness.  All other systems reviewed and are negative.   Nutrition: clear liquid Tolerating Diet: yes Tolerating PT: Ambulatory.      DRUG ALLERGIES:   Allergies  Allergen Reactions  . Metformin And Related     Pt. States it makes his stomach bleed because of a stomach ulcer  . Motrin [Ibuprofen]     Pt. States it "inflames his stomach, causes bleeding in stomach"    VITALS:  Blood pressure 98/74, pulse 100, temperature 98.4 F (36.9 C), temperature source Oral, resp. rate 17, height 5\' 11"  (1.803 m), weight 78.9 kg, SpO2 99 %.  PHYSICAL EXAMINATION:   Physical Exam  GENERAL:  53 y.o.-year-old patient lying in bed in no acute distress.  EYES: Pupils equal, round, reactive to light and accommodation. No scleral icterus. Extraocular muscles intact.  HEENT: Head atraumatic, normocephalic. Oropharynx and nasopharynx clear.  NECK:  Supple, no jugular venous distention. No thyroid enlargement, no tenderness.  LUNGS: Normal breath sounds bilaterally, no wheezing, rales, rhonchi.  No use of accessory muscles of respiration.  CARDIOVASCULAR: S1, S2 normal. No murmurs, rubs, or gallops.  ABDOMEN: Soft, Tender in epigastric area with voluntary guarding,  nondistended. Bowel sounds present. No organomegaly or mass.  EXTREMITIES: No cyanosis, clubbing or edema b/l.    NEUROLOGIC: Cranial nerves II through XII are intact. No focal Motor or sensory deficits b/l.   PSYCHIATRIC: The patient is alert and oriented x 3.  SKIN: No obvious rash, lesion, or ulcer.    LABORATORY PANEL:   CBC Recent Labs  Lab 06/15/19 2206  WBC 11.3*  HGB 10.6*  HCT 32.8*  PLT 293   ------------------------------------------------------------------------------------------------------------------  Chemistries  Recent Labs  Lab 06/15/19 2206  NA 135  K 3.1*  CL 98  CO2 24  GLUCOSE 114*  BUN 8  CREATININE 0.87  CALCIUM 9.5  AST 18  ALT 10  ALKPHOS 75  BILITOT 1.0   ------------------------------------------------------------------------------------------------------------------  Cardiac Enzymes No results for input(s): TROPONINI in the last 168 hours. ------------------------------------------------------------------------------------------------------------------  RADIOLOGY:  Dg Chest Port 1 View  Result Date: 06/16/2019 CLINICAL DATA:  Recent EGD with abdominal pain. EXAM: PORTABLE CHEST 1 VIEW COMPARISON:  Apr 10, 2019 FINDINGS: The heart size and mediastinal contours are within normal limits. Both lungs are clear. The visualized skeletal structures are unremarkable. IMPRESSION: No active disease. Electronically Signed   By: Constance Holster M.D.   On: 06/16/2019 00:00     ASSESSMENT AND PLAN:   53 year old male with past medical history of hypertension, diabetes, history of liver cancer, gastroparesis who was just discharged from the hospital 2 days  ago now returns back due to abdominal pain/chest pain.  1.  Abdominal pain with intractable nausea vomiting- patient was  recently admitted to the hospital for similar reasons and underwent an upper GI endoscopy which showed grade B esophagitis and nonbleeding gastric ulcers. - Patient's abdomen is benign and he has some voluntary guarding. -We will continue supportive care with IV fluids, pain control, will start him on clear liquid diet and advance as tolerated. -Continue Protonix, Carafate  2.  Diabetes type 2 without complication-continue Lantus, sliding scale insulin. -Follow blood sugars.  3.  History of diabetic gastroparesis-chronic for the patient. -Continue Reglan.  4.  Essential hypertension-continue Lopressor  5.  History of gout-no acute attack.  Continue colchicine.  6.  Depression-continue Cymbalta.  7.  Neuropathy-continue gabapentin.  8.  BPH-no urinary retention.  Continue Flomax.  Possible d/c tomorrow.   All the records are reviewed and case discussed with Care Management/Social Worker. Management plans discussed with the patient, family and they are in agreement.  CODE STATUS: Full code  DVT Prophylaxis: ted's and SCD's   TOTAL TIME TAKING CARE OF THIS PATIENT: 30 minutes.   POSSIBLE D/C IN 1-2 DAYS, DEPENDING ON CLINICAL CONDITION.   Henreitta Leber M.D on 06/16/2019 at 12:52 PM  Between 7am to 6pm - Pager - [phone]  After 6pm go to [email or web address] - [employer] [facility]  Office  [phone]  CC: Primary care physician; [name], PA

## 2019-06-16 NOTE — H&P (Signed)
Kenneth Copeland is an 53 y.o. male.   Chief Complaint: Abdominal pain HPI: The patient with past medical history of hypertension, diabetes and liver cancer (status post partial hepatectomy and chemotherapy in the past; now reportedly in remission) presents to the emergency department with abdominal pain 2 days after being discharged from the hospital for hematemesis.  He was found to have peptic ulcer disease.  However, the patient reportedly did not fill his medications and returns with upper abdominal pain.  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  After multiple doses of analgesia without relief the emergency department staff call hospitalist service for management.  Past Medical History:  Diagnosis Date  . Arthritis   . Cancer ([place])   . Diabetes mellitus without complication ([place])   . Hypertension   . Liver cancer [facility])     Past Surgical History:  Procedure Laterality Date  . ESOPHAGOGASTRODUODENOSCOPY (EGD) WITH PROPOFOL N/A 06/12/2019   Procedure: ESOPHAGOGASTRODUODENOSCOPY (EGD) WITH PROPOFOL;  Surgeon: Jonathon Bellows, MD;  Location: [facility] ENDOSCOPY;  Service: Gastroenterology;  Laterality: N/A;  . HERNIA REPAIR    . LIVER LOBECTOMY    . TEE WITHOUT CARDIOVERSION N/A 04/11/2019   Procedure: TRANSESOPHAGEAL ECHOCARDIOGRAM (TEE);  Surgeon: Corey Skains, MD;  Location: [facility] ORS;  Service: Cardiovascular;  Laterality: N/A;    Family History  Family history unknown: Yes   Social History:  reports that he has quit smoking. He has never used smokeless tobacco. He reports previous alcohol use. He reports current drug use. Frequency: 0.50 times per week. Drugs: Marijuana and Cocaine.  Allergies:  Allergies  Allergen Reactions  . Metformin And Related     Pt. States it makes his stomach bleed because of a stomach ulcer  . Motrin [Ibuprofen]     Pt. States it  "inflames his stomach, causes bleeding in stomach"    Medications Prior to Admission  Medication Sig Dispense Refill  . acetaminophen (TYLENOL) 325 MG tablet Take 650 mg by mouth every 6 (six) hours as needed for mild pain or fever.    . calcium carbonate (TUMS - DOSED IN MG ELEMENTAL CALCIUM) 500 MG chewable tablet Chew 1 tablet (200 mg of elemental calcium total) by mouth 3 (three) times daily as needed for indigestion or heartburn.    . colchicine 0.6 MG tablet Take 1 tablet (0.6 mg total) by mouth 2 (two) times daily. 30 tablet 0  . dicyclomine (BENTYL) 20 MG tablet Take 1 tablet (20 mg total) by mouth 3 (three) times daily as needed. 30 tablet 0  . docusate sodium (COLACE) 100 MG capsule Take 1 capsule (100 mg total) by mouth daily as needed for mild constipation. 10 capsule 0  . DULoxetine (CYMBALTA) 30 MG capsule Take 1 capsule (30 mg total) by mouth daily. 30 capsule 2  . gabapentin (NEURONTIN) 300 MG capsule Take 1 capsule (300 mg total) by mouth 3 (three) times daily. 90 capsule 2  . insulin aspart (NOVOLOG) 100 UNIT/ML injection Inject 5 Units into the skin 3 (three) times daily with meals. 10 mL 11  . insulin glargine (LANTUS) 100 UNIT/ML injection Inject 0.4 mLs (40 Units total) into the skin 2 (two) times daily.    Marland Kitchen ipratropium-albuterol (DUONEB) 0.5-2.5 (3) MG/3ML SOLN Take 3 mLs by nebulization every 6 (six) hours as needed. 360 mL   . metoCLOPramide (REGLAN) 10 MG tablet Take 1 tablet (10  mg total) by mouth 4 (four) times daily -  before meals and at bedtime. 120 tablet 0  . metoprolol tartrate (LOPRESSOR) 25 MG tablet Take 0.5 tablets (12.5 mg total) by mouth 2 (two) times daily. 60 tablet 0  . Multiple Vitamin (MULTIVITAMIN WITH MINERALS) TABS tablet Take 1 tablet by mouth daily.    . ondansetron (ZOFRAN) 4 MG tablet Take 1 tablet (4 mg total) by mouth every 6 (six) hours as needed for nausea. 20 tablet 0  . oxyCODONE (ROXICODONE) 5 MG immediate release tablet Take 2 tablets  (10 mg total) by mouth every 8 (eight) hours as needed. 15 tablet 0  . pantoprazole (PROTONIX) 40 MG tablet Take 40 mg by mouth 2 (two) times daily.     . sucralfate (CARAFATE) 1 g tablet Take 1 g by mouth 4 (four) times daily.     . tamsulosin (FLOMAX) 0.4 MG CAPS capsule Take 1 capsule (0.4 mg total) by mouth daily. 30 capsule 3    Results for orders placed or performed during the hospital encounter of 06/15/19 (from the past 48 hour(s))  CBC with Differential     Status: Abnormal   Collection Time: 06/15/19 10:06 PM  Result Value Ref Range   WBC 11.3 (H) 4.0 - 10.5 K/uL   RBC 4.19 (L) 4.22 - 5.81 MIL/uL   Hemoglobin 10.6 (L) 13.0 - 17.0 g/dL   HCT 32.8 (L) 39.0 - 52.0 %   MCV 78.3 (L) 80.0 - 100.0 fL   MCH 25.3 (L) 26.0 - 34.0 pg   MCHC 32.3 30.0 - 36.0 g/dL   RDW 16.7 (H) 11.5 - 15.5 %   Platelets 293 150 - 400 K/uL   nRBC 0.0 0.0 - 0.2 %   Neutrophils Relative % 74 %   Neutro Abs 8.2 (H) 1.7 - 7.7 K/uL   Lymphocytes Relative 16 %   Lymphs Abs 1.8 0.7 - 4.0 K/uL   Monocytes Relative 10 %   Monocytes Absolute 1.1 (H) 0.1 - 1.0 K/uL   Eosinophils Relative 0 %   Eosinophils Absolute 0.0 0.0 - 0.5 K/uL   Basophils Relative 0 %   Basophils Absolute 0.0 0.0 - 0.1 K/uL   Immature Granulocytes 0 %   Abs Immature Granulocytes 0.04 0.00 - 0.07 K/uL    Comment: Performed at [facility], [address] [place], [place] [address]  Comprehensive metabolic panel     Status: Abnormal   Collection Time: 06/15/19 10:06 PM  Result Value Ref Range   Sodium 135 135 - 145 mmol/L   Potassium 3.1 (L) 3.5 - 5.1 mmol/L   Chloride 98 98 - 111 mmol/L   CO2 24 22 - 32 mmol/L   Glucose, Bld 114 (H) 70 - 99 mg/dL   BUN 8 6 - 20 mg/dL   Creatinine, Ser 0.87 0.61 - 1.24 mg/dL   Calcium 9.5 8.9 - 10.3 mg/dL   Total Protein 7.8 6.5 - 8.1 g/dL   Albumin 4.2 3.5 - 5.0 g/dL   AST 18 15 - 41 U/L   ALT 10 0 - 44 U/L   Alkaline Phosphatase 75 38 - 126 U/L   Total Bilirubin 1.0 0.3 - 1.2 mg/dL    GFR calc non Af Amer >60 >60 mL/min   GFR calc Af Amer >60 >60 mL/min   Anion gap 13 5 - 15    Comment: Performed at [facility], [address], [place], [place] [address]  Ethanol     Status:  None   Collection Time: 06/15/19 10:06 PM  Result Value Ref Range   Alcohol, Ethyl (B) <10 <10 mg/dL    Comment: (NOTE) Lowest detectable limit for serum alcohol is 10 mg/dL. For medical purposes only. Performed at [facility], [address] [place], [place] [address]   Lipase, blood     Status: None   Collection Time: 06/15/19 10:06 PM  Result Value Ref Range   Lipase 19 11 - 51 U/L    Comment: Performed at [facility], [address], [place], [place] [address]   Dg Chest Port 1 View  Result Date: 06/16/2019 CLINICAL DATA:  Recent EGD with abdominal pain. EXAM: PORTABLE CHEST 1 VIEW COMPARISON:  Apr 10, 2019 FINDINGS: The heart size and mediastinal contours are within normal limits. Both lungs are clear. The visualized skeletal structures are unremarkable. IMPRESSION: No active disease. Electronically Signed   By: Constance Holster M.D.   On: 06/16/2019 00:00    Review of Systems  Constitutional: Negative for chills and fever.  HENT: Negative for sore throat and tinnitus.   Eyes: Negative for blurred vision and redness.  Respiratory: Negative for cough and shortness of breath.   Cardiovascular: Negative for chest pain, palpitations, orthopnea and PND.  Gastrointestinal: Positive for abdominal pain, nausea and vomiting. Negative for diarrhea.  Genitourinary: Negative for dysuria, frequency and urgency.  Musculoskeletal: Negative for joint pain and myalgias.  Skin: Negative for rash.       No lesions  Neurological: Negative for speech change, focal weakness and weakness.  Endo/Heme/Allergies: Does not bruise/bleed easily.       No temperature intolerance  Psychiatric/Behavioral: Negative for depression and suicidal ideas.    Blood pressure (!)  189/137, pulse (!) 116, temperature 97.7 F (36.5 C), temperature source Oral, resp. rate 16, height 5\' 11"  (1.803 m), weight 78.9 kg, SpO2 100 %. Physical Exam  Constitutional: He is oriented to person, place, and time. He appears well-developed and well-nourished. No distress.  HENT:  Head: Normocephalic and atraumatic.  Mouth/Throat: Oropharynx is clear and moist.  Eyes: Pupils are equal, round, and reactive to light. Conjunctivae and EOM are normal. No scleral icterus.  Neck: Normal range of motion. Neck supple. No JVD present. No tracheal deviation present. No thyromegaly present.  Cardiovascular: Normal rate, regular rhythm, normal heart sounds and intact distal pulses. Exam reveals no gallop and no friction rub.  No murmur heard. Respiratory: Effort normal and breath sounds normal. No respiratory distress. He has no wheezes.  GI: Soft. Bowel sounds are normal. He exhibits no distension. There is no abdominal tenderness.  Genitourinary:    Genitourinary Comments: Deferred   Musculoskeletal: Normal range of motion.        General: No edema.  Lymphadenopathy:    He has no cervical adenopathy.  Neurological: He is alert and oriented to person, place, and time. No cranial nerve deficit.  Skin: Skin is warm and dry. No rash noted. No erythema.  Psychiatric: He has a normal mood and affect. His behavior is normal. Judgment and thought content normal.     Assessment/Plan This is a 53 year old male admitted for intractable abdominal pain  1.  Abdominal pain: With Nausea vomiting; intractable.  IV morphine as needed for severe pain.  Known peptic ulcer disease.  Hydrate with intravenous fluid.  Maintain n.p.o. until evaluated by gastroenterology.  No hematemesis with this admission.  Continue Carafate.  The patient may also have some narcotic induced ileus given large amounts of opiate  analgesia he takes 2.  Hypokalemia: Secondary to vomiting.  Replete potassium.  Monitor telemetry. 3.   Diabetes mellitus type 2: Continue basal insulin adjusted for hospital diet/n.p.o. status.  Sliding scale insulin while hospitalized.  Gabapentin and Cymbalta for neuropathy. 4.  Hypertension: Controlled; continue metoprolol 5.  Gout: Continue colchicine 6.  BPH: Continue tamsulosin 7.  DVT prophylaxis: SCDs 8.  GI prophylaxis: Pantoprazole per home regimen The patient is a full code.  Time spent on admission orders and patient care approximately 45 minutes   Harrie Foreman, MD 06/16/2019, 3:02 AM

## 2019-06-16 NOTE — Care Management Obs Status (Signed)
[place] NOTIFICATION   Patient Details  Name: Kenneth Copeland MRN: [id number] Date of Birth: 1966/06/20   Medicare Observation Status Notification Given:  Yes    Magally Vahle A Edwinna Rochette, RN 06/16/2019, 8:18 AM

## 2019-06-16 NOTE — ED Notes (Signed)
ED TO INPATIENT HANDOFF REPORT  ED Nurse Name and Phone #: Antonieta Pert, [phone]  S Name/Age/Gender Kenneth Copeland 53 y.o. male Room/Bed: ED17A/ED17A  Code Status   Code Status: Prior  Home/SNF/Other Home Patient oriented to: self, place, time and situation Is this baseline? Yes   Triage Complete: Triage complete  Chief Complaint Gastric pain  Triage Note abd pain and n/v today. Patient had endoscopy done on 7/21. Dx with hiatal hernia and ulcers. Did not pick up any of the rx's sent to his pharmacy. Presents with c/o of abd pain. zofram given in route to ed.    Allergies Allergies  Allergen Reactions  . Metformin And Related     Pt. States it makes his stomach bleed because of a stomach ulcer  . Motrin [Ibuprofen]     Pt. States it "inflames his stomach, causes bleeding in stomach"    Level of Care/Admitting Diagnosis ED Disposition    ED Disposition Condition : [place] [100120]  Level of Care: Med-Surg [16]  Covid Evaluation: Asymptomatic Screening Protocol (No Symptoms)  Diagnosis: Intractable nausea and vomiting [350093]  Admitting Physician: Harrie Foreman [[id number]]  Attending Physician: Harrie Foreman [phone]  PT Class (Do Not Modify): Observation [104]  PT Acc Code (Do Not Modify): Observation [10022]       B Medical/Surgery History Past Medical History:  Diagnosis Date  . Arthritis   . Cancer ([place])   . Diabetes mellitus without complication ([place])   . Hypertension   . Liver cancer [facility])    Past Surgical History:  Procedure Laterality Date  . ESOPHAGOGASTRODUODENOSCOPY (EGD) WITH PROPOFOL N/A 06/12/2019   Procedure: ESOPHAGOGASTRODUODENOSCOPY (EGD) WITH PROPOFOL;  Surgeon: Jonathon Bellows, MD;  Location: [facility] ENDOSCOPY;  Service: Gastroenterology;  Laterality: N/A;  . HERNIA REPAIR    . LIVER LOBECTOMY    . TEE WITHOUT CARDIOVERSION N/A 04/11/2019   Procedure: TRANSESOPHAGEAL  ECHOCARDIOGRAM (TEE);  Surgeon: Corey Skains, MD;  Location: [facility] ORS;  Service: Cardiovascular;  Laterality: N/A;     A IV Location/Drains/Wounds Patient Lines/Drains/Airways Status   Active Line/Drains/Airways    Name:   Placement date:   Placement time:   Site:   Days:   Peripheral IV Anterior;Distal;Left Forearm   -    -    Forearm      Peripheral IV 06/15/19 Anterior;Distal;Left;Upper Arm   06/15/19    2158    Arm   1          Intake/Output Last 24 hours No intake or output data in the 24 hours ending 06/16/19 0248  Labs/Imaging Results for orders placed or performed during the hospital encounter of 06/15/19 (from the past 48 hour(s))  CBC with Differential     Status: Abnormal   Collection Time: 06/15/19 10:06 PM  Result Value Ref Range   WBC 11.3 (H) 4.0 - 10.5 K/uL   RBC 4.19 (L) 4.22 - 5.81 MIL/uL   Hemoglobin 10.6 (L) 13.0 - 17.0 g/dL   HCT 32.8 (L) 39.0 - 52.0 %   MCV 78.3 (L) 80.0 - 100.0 fL   MCH 25.3 (L) 26.0 - 34.0 pg   MCHC 32.3 30.0 - 36.0 g/dL   RDW 16.7 (H) 11.5 - 15.5 %   Platelets 293 150 - 400 K/uL   nRBC 0.0 0.0 - 0.2 %   Neutrophils Relative % 74 %   Neutro Abs 8.2 (H) 1.7 - 7.7 K/uL   Lymphocytes Relative 16 %  Lymphs Abs 1.8 0.7 - 4.0 K/uL   Monocytes Relative 10 %   Monocytes Absolute 1.1 (H) 0.1 - 1.0 K/uL   Eosinophils Relative 0 %   Eosinophils Absolute 0.0 0.0 - 0.5 K/uL   Basophils Relative 0 %   Basophils Absolute 0.0 0.0 - 0.1 K/uL   Immature Granulocytes 0 %   Abs Immature Granulocytes 0.04 0.00 - 0.07 K/uL    Comment: Performed at [facility], [address] [place], [place] [address]  Comprehensive metabolic panel     Status: Abnormal   Collection Time: 06/15/19 10:06 PM  Result Value Ref Range   Sodium 135 135 - 145 mmol/L   Potassium 3.1 (L) 3.5 - 5.1 mmol/L   Chloride 98 98 - 111 mmol/L   CO2 24 22 - 32 mmol/L   Glucose, Bld 114 (H) 70 - 99 mg/dL   BUN 8 6 - 20 mg/dL   Creatinine, Ser 0.87 0.61 - 1.24  mg/dL   Calcium 9.5 8.9 - 10.3 mg/dL   Total Protein 7.8 6.5 - 8.1 g/dL   Albumin 4.2 3.5 - 5.0 g/dL   AST 18 15 - 41 U/L   ALT 10 0 - 44 U/L   Alkaline Phosphatase 75 38 - 126 U/L   Total Bilirubin 1.0 0.3 - 1.2 mg/dL   GFR calc non Af Amer >60 >60 mL/min   GFR calc Af Amer >60 >60 mL/min   Anion gap 13 5 - 15    Comment: Performed at [facility], [address] [place], [place] [address]  Ethanol     Status: None   Collection Time: 06/15/19 10:06 PM  Result Value Ref Range   Alcohol, Ethyl (B) <10 <10 mg/dL    Comment: (NOTE) Lowest detectable limit for serum alcohol is 10 mg/dL. For medical purposes only. Performed at [facility], [address] [place], [place] [address]   Lipase, blood     Status: None   Collection Time: 06/15/19 10:06 PM  Result Value Ref Range   Lipase 19 11 - 51 U/L    Comment: Performed at [facility], [address], [place], [place] [address]   Dg Chest Port 1 View  Result Date: 06/16/2019 CLINICAL DATA:  Recent EGD with abdominal pain. EXAM: PORTABLE CHEST 1 VIEW COMPARISON:  Apr 10, 2019 FINDINGS: The heart size and mediastinal contours are within normal limits. Both lungs are clear. The visualized skeletal structures are unremarkable. IMPRESSION: No active disease. Electronically Signed   By: Constance Holster M.D.   On: 06/16/2019 00:00    Pending Labs [employer] (From admission, onward)    Start     Ordered   Signed and Held  TSH  Add-on,   R     Signed and Held          Vitals/Pain Today's Vitals   06/16/19 0130 06/16/19 0145 06/16/19 0200 06/16/19 0215  BP: (!) 128/100 (!) 132/102 (!) 147/122 (!) 189/137  Pulse: (!) 104 (!) 104 (!) 118 (!) 116  Resp: (!) 21 (!) 21 (!) 25 16  Temp:      TempSrc:      SpO2: 98% 99% 99% 100%  Weight:      Height:      PainSc:        Isolation Precautions No active isolations  Medications Medications  potassium chloride 10 mEq in 100 mL IVPB (10 mEq  Intravenous New Bag/Given 06/16/19 0154)  potassium chloride 10 mEq  in 100 mL IVPB (has no administration in time range)  sodium chloride 0.9 % bolus 1,000 mL (1,000 mLs Intravenous New Bag/Given 06/16/19 0038)  famotidine (PEPCID) IVPB 20 mg premix (0 mg Intravenous Stopped 06/16/19 0109)  metoCLOPramide (REGLAN) injection 10 mg (10 mg Intravenous Given 06/16/19 0038)  haloperidol lactate (HALDOL) injection 5 mg (5 mg Intravenous Given 06/16/19 0241)  fentaNYL (SUBLIMAZE) injection 50 mcg (50 mcg Intravenous Given 06/16/19 0241)    Mobility walks Low fall risk   Focused Assessments GI assessment   R Recommendations: See Admitting Provider Note  Report given to:   Additional Notes: Pt recently discharged for same. Pt did not pick up home meds after discharged.

## 2019-06-17 LAB — GLUCOSE, CAPILLARY
Glucose-Capillary: 300 mg/dL — ABNORMAL HIGH (ref 70–99)
Glucose-Capillary: 476 mg/dL — ABNORMAL HIGH (ref 70–99)

## 2019-06-17 LAB — POTASSIUM: Potassium: 4 mmol/L (ref 3.5–5.1)

## 2019-06-17 MED ORDER — INSULIN ASPART 100 UNIT/ML ~~LOC~~ SOLN
14.0000 [IU] | Freq: Once | SUBCUTANEOUS | Status: AC
  Administered 2019-06-17: 12:00:00 14 [IU] via SUBCUTANEOUS
  Filled 2019-06-17: qty 1

## 2019-06-17 NOTE — Discharge Summary (Signed)
[facility] NAME: Kenneth Copeland    MR#:  [id number]  DATE OF BIRTH:  [birth year]  DATE OF ADMISSION:  06/15/2019 ADMITTING PHYSICIAN: Harrie Foreman, MD  DATE OF DISCHARGE: No discharge date for patient encounter.  PRIMARY CARE PHYSICIAN: [name], PA    ADMISSION DIAGNOSIS:  Gastroparesis [K31.84] Hypokalemia [E87.6] Pain of upper abdomen [R10.10] Esophagitis [K20.9] Intractable vomiting with nausea, unspecified vomiting type [R11.2]  DISCHARGE DIAGNOSIS:  Active Problems:   Intractable nausea and vomiting   SECONDARY DIAGNOSIS:   Past Medical History:  Diagnosis Date  . Arthritis   . Cancer ([place])   . Diabetes mellitus without complication ([place])   . Hypertension   . Liver cancer [facility])     HOSPITAL COURSE:   53 year old male with past medical history of hypertension, diabetes, history of liver cancer, gastroparesis who was just discharged from the hospital 2 days ago now returns back due to abdominal pain/chest pain.  1.  Abdominal pain with intractable nausea vomiting- patient was recently admitted to the hospital for similar reasons and underwent an upper GI endoscopy which showed grade B esophagitis and nonbleeding gastric ulcers. -Patient was treated supportively with IV fluids, pain control, IV Protonix and maintained on his Carafate.  He was started on a clear liquid diet and has slowly been advanced to a regular diet which he is tolerating without any exacerbation of his abdominal pain nausea or vomiting and he has improved.  He is therefore being discharged home. -We will continue his Protonix twice daily and Carafate.  2.  Diabetes type 2 without complication -Blood sugar stable while in the hospital.  Patient will continue his Lantus, aspart with meals.    3.  History of diabetic gastroparesis-chronic for the patient. - pt. Will Continue Reglan.  4.  Essential hypertension- pt. Will continue  Lopressor  5.  History of gout-no acute attack.  pt. Will Continue colchicine.  6.  Depression- pt. Will continue Cymbalta.  7.  Neuropathy-pt. Will continue gabapentin.  8.  BPH-no urinary retention.  Continue Flomax.  DISCHARGE CONDITIONS:   Stable.   CONSULTS OBTAINED:    DRUG ALLERGIES:   Allergies  Allergen Reactions  . Metformin And Related     Pt. States it makes his stomach bleed because of a stomach ulcer  . Motrin [Ibuprofen]     Pt. States it "inflames his stomach, causes bleeding in stomach"    DISCHARGE MEDICATIONS:   Allergies as of 06/17/2019      Reactions   Metformin And Related    Pt. States it makes his stomach bleed because of a stomach ulcer   Motrin [ibuprofen]    Pt. States it "inflames his stomach, causes bleeding in stomach"      Medication List    TAKE these medications   acetaminophen 325 MG tablet Commonly known as: TYLENOL Take 650 mg by mouth every 6 (six) hours as needed for mild pain or fever.   calcium carbonate 500 MG chewable tablet Commonly known as: TUMS - dosed in mg elemental calcium Chew 1 tablet (200 mg of elemental calcium total) by mouth 3 (three) times daily as needed for indigestion or heartburn.   colchicine 0.6 MG tablet Take 1 tablet (0.6 mg total) by mouth 2 (two) times daily.   dicyclomine 20 MG tablet Commonly known as: Bentyl Take 1 tablet (20 mg total) by mouth 3 (three) times daily as needed.   docusate sodium 100  MG capsule Commonly known as: COLACE Take 1 capsule (100 mg total) by mouth daily as needed for mild constipation.   DULoxetine 30 MG capsule Commonly known as: Cymbalta Take 1 capsule (30 mg total) by mouth daily.   gabapentin 300 MG capsule Commonly known as: NEURONTIN Take 1 capsule (300 mg total) by mouth 3 (three) times daily.   insulin aspart 100 UNIT/ML injection Commonly known as: novoLOG Inject 5 Units into the skin 3 (three) times daily with meals.   insulin glargine 100  UNIT/ML injection Commonly known as: LANTUS Inject 0.4 mLs (40 Units total) into the skin 2 (two) times daily.   ipratropium-albuterol 0.5-2.5 (3) MG/3ML Soln Commonly known as: DUONEB Take 3 mLs by nebulization every 6 (six) hours as needed.   metoCLOPramide 10 MG tablet Commonly known as: REGLAN Take 1 tablet (10 mg total) by mouth 4 (four) times daily -  before meals and at bedtime.   metoprolol tartrate 25 MG tablet Commonly known as: LOPRESSOR Take 0.5 tablets (12.5 mg total) by mouth 2 (two) times daily.   multivitamin with minerals Tabs tablet Take 1 tablet by mouth daily.   ondansetron 4 MG tablet Commonly known as: ZOFRAN Take 1 tablet (4 mg total) by mouth every 6 (six) hours as needed for nausea.   oxyCODONE 5 MG immediate release tablet Commonly known as: Roxicodone Take 2 tablets (10 mg total) by mouth every 8 (eight) hours as needed.   pantoprazole 40 MG tablet Commonly known as: PROTONIX Take 40 mg by mouth 2 (two) times daily.   sucralfate 1 g tablet Commonly known as: CARAFATE Take 1 g by mouth 4 (four) times daily.   tamsulosin 0.4 MG Caps capsule Commonly known as: FLOMAX Take 1 capsule (0.4 mg total) by mouth daily.         DISCHARGE INSTRUCTIONS:   DIET:  Cardiac diet and Diabetic diet  DISCHARGE CONDITION:  Stable  ACTIVITY:  Activity as tolerated  OXYGEN:  Home Oxygen: No.   Oxygen Delivery: room air  DISCHARGE LOCATION:  home   If you experience worsening of your admission symptoms, develop shortness of breath, life threatening emergency, suicidal or homicidal thoughts you must seek medical attention immediately by calling 911 or calling your MD immediately  if symptoms less severe.  You Must read complete instructions/literature along with all the possible adverse reactions/side effects for all the Medicines you take and that have been prescribed to you. Take any new Medicines after you have completely understood and accpet all  the possible adverse reactions/side effects.   Please note  You were cared for by a hospitalist during your hospital stay. If you have any questions about your discharge medications or the care you received while you were in the hospital after you are discharged, you can call the unit and asked to speak with the hospitalist on call if the hospitalist that took care of you is not available. Once you are discharged, your primary care physician will handle any further medical issues. Please note that NO REFILLS for any discharge medications will be authorized once you are discharged, as it is imperative that you return to your primary care physician (or establish a relationship with a primary care physician if you do not have one) for your aftercare needs so that they can reassess your need for medications and monitor your lab values.     Today   No acute events overnight, abdominal pain is improved.  No nausea or vomiting.  Tolerating  p.o. well.  Will discharge home today.  VITAL SIGNS:  Blood pressure (!) 135/101, pulse 83, temperature 98 F (36.7 C), temperature source Oral, resp. rate 16, height 5\' 11"  (1.803 m), weight 80.5 kg, SpO2 99 %.  I/O:    Intake/Output Summary (Last 24 hours) at 06/17/2019 1241 Last data filed at 06/17/2019 1119 Gross per 24 hour  Intake 900 ml  Output -  Net 900 ml    PHYSICAL EXAMINATION:  GENERAL:  53 y.o.-year-old patient lying in the bed with no acute distress.  EYES: Pupils equal, round, reactive to light and accommodation. No scleral icterus. Extraocular muscles intact.  HEENT: Head atraumatic, normocephalic. Oropharynx and nasopharynx clear.  NECK:  Supple, no jugular venous distention. No thyroid enlargement, no tenderness.  LUNGS: Normal breath sounds bilaterally, no wheezing, rales,rhonchi. No use of accessory muscles of respiration.  CARDIOVASCULAR: S1, S2 normal. No murmurs, rubs, or gallops.  ABDOMEN: Soft, non-tender, non-distended. Bowel  sounds present. No organomegaly or mass.  EXTREMITIES: No pedal edema, cyanosis, or clubbing.  NEUROLOGIC: Cranial nerves II through XII are intact. No focal motor or sensory defecits b/l.  PSYCHIATRIC: The patient is alert and oriented x 3 SKIN: No obvious rash, lesion, or ulcer.   DATA REVIEW:   CBC Recent Labs  Lab 06/15/19 2206  WBC 11.3*  HGB 10.6*  HCT 32.8*  PLT 293    Chemistries  Recent Labs  Lab 06/15/19 2206 06/17/19 0645  NA 135  --   K 3.1* 4.0  CL 98  --   CO2 24  --   GLUCOSE 114*  --   BUN 8  --   CREATININE 0.87  --   CALCIUM 9.5  --   AST 18  --   ALT 10  --   ALKPHOS 75  --   BILITOT 1.0  --     Cardiac Enzymes No results for input(s): TROPONINI in the last 168 hours.  Microbiology Results  Results for orders placed or performed during the hospital encounter of 06/11/19  SARS Coronavirus 2 (CEPHEID - Performed in [facility] hospital lab), Hosp Order     Status: None   Collection Time: 06/11/19  5:49 AM   Specimen: Nasopharyngeal Swab  Result Value Ref Range Status   SARS Coronavirus 2 NEGATIVE NEGATIVE Final    Comment: (NOTE) If result is NEGATIVE SARS-CoV-2 target nucleic acids are NOT DETECTED. The SARS-CoV-2 RNA is generally detectable in upper and lower  respiratory specimens during the acute phase of infection. The lowest  concentration of SARS-CoV-2 viral copies this assay can detect is 250  copies / mL. A negative result does not preclude SARS-CoV-2 infection  and should not be used as the sole basis for treatment or other  patient management decisions.  A negative result may occur with  improper specimen collection / handling, submission of specimen other  than nasopharyngeal swab, presence of viral mutation(s) within the  areas targeted by this assay, and inadequate number of viral copies  (<250 copies / mL). A negative result must be combined with clinical  observations, patient history, and epidemiological information. If  result is POSITIVE SARS-CoV-2 target nucleic acids are DETECTED. The SARS-CoV-2 RNA is generally detectable in upper and lower  respiratory specimens dur ing the acute phase of infection.  Positive  results are indicative of active infection with SARS-CoV-2.  Clinical  correlation with patient history and other diagnostic information is  necessary to determine patient infection status.  Positive results do  not rule out bacterial infection or co-infection with other viruses. If result is PRESUMPTIVE POSTIVE SARS-CoV-2 nucleic acids MAY BE PRESENT.   A presumptive positive result was obtained on the submitted specimen  and confirmed on repeat testing.  While 2019 novel coronavirus  (SARS-CoV-2) nucleic acids may be present in the submitted sample  additional confirmatory testing may be necessary for epidemiological  and / or clinical management purposes  to differentiate between  SARS-CoV-2 and other Sarbecovirus currently known to infect humans.  If clinically indicated additional testing with an alternate test  methodology [phone]) is advised. The SARS-CoV-2 RNA is generally  detectable in upper and lower respiratory sp ecimens during the acute  phase of infection. The expected result is Negative. Fact Sheet for Patients:  StrictlyIdeas.no Fact Sheet for Healthcare Providers: BankingDealers.co.za This test is not yet approved or cleared by the [place] FDA and has been authorized for detection and/or diagnosis of SARS-CoV-2 by FDA under an Emergency Use Authorization (EUA).  This EUA will remain in effect (meaning this test can be used) for the duration of the COVID-19 declaration under Section 564(b)(1) of the Act, 21 U.S.C. section 360bbb-3(b)(1), unless the authorization is terminated or revoked sooner. Performed at [facility], [address], [place], [place] [address]     RADIOLOGY:  Dg Chest Port 1  View  Result Date: 06/16/2019 CLINICAL DATA:  Recent EGD with abdominal pain. EXAM: PORTABLE CHEST 1 VIEW COMPARISON:  Apr 10, 2019 FINDINGS: The heart size and mediastinal contours are within normal limits. Both lungs are clear. The visualized skeletal structures are unremarkable. IMPRESSION: No active disease. Electronically Signed   By: Constance Holster M.D.   On: 06/16/2019 00:00      Management plans discussed with the patient, family and they are in agreement.  CODE STATUS:     Code Status Orders  (From admission, onward)         Start     Ordered   06/16/19 0440  Full code  Continuous     06/16/19 0439         TOTAL TIME TAKING CARE OF THIS PATIENT: 40 minutes.    Henreitta Leber M.D on 06/17/2019 at 12:41 PM  Between 7am to 6pm - Pager - [phone]  After 6pm go to [email or web address] - [employer] [facility]  Office  [phone]  CC: Primary care physician; [name], PA

## 2019-06-17 NOTE — Progress Notes (Signed)
Pt's ride [facility] the [facility]; pt discharged via wheelchair by nursing to the [facility] entrance 

## 2019-06-17 NOTE — Progress Notes (Signed)
MD order received in [facility] to discharge pt home today; verbally reviewed AVS with pt including medications, no changes to medications; pt to call on Monday, 06/18/2019 to schedule follow up appointment; pt verbalized understanding with no questions voiced at this time; verbally instructed pt to call when his ride is present at the [facility] entrance; pt's discharge pending arrival of his ride home

## 2019-06-17 NOTE — Plan of Care (Signed)
  Problem: Education: °Goal: Knowledge of General Education information will improve °Description: Including pain rating scale, medication(s)/side effects and non-pharmacologic comfort measures °Outcome: Adequate for Discharge °  °Problem: Activity: °Goal: Risk for activity intolerance will decrease °Outcome: Adequate for Discharge °  °Problem: Pain Managment: °Goal: General experience of comfort will improve °Outcome: Adequate for Discharge °  °Problem: Safety: °Goal: Ability to remain free from injury will improve °Outcome: Adequate for Discharge °  °Problem: Skin Integrity: °Goal: Risk for impaired skin integrity will decrease °Outcome: Adequate for Discharge °  °

## 2019-06-18 ENCOUNTER — Telehealth: Payer: Self-pay | Admitting: Pharmacy Technician

## 2019-06-18 NOTE — Telephone Encounter (Signed)
Patient has active Medicare and a Part D Plan. Unable to provide services through [facility] until patient is in the gap and provides proper documentation. Patient notified by letter.  [facility]

## 2019-06-20 ENCOUNTER — Telehealth: Payer: Self-pay

## 2019-06-20 NOTE — Telephone Encounter (Addendum)
Called pt to inform him of biopsy results and Dr. Georgeann Oppenheim instructions.  Unable to contact, VM did not pick up Letter will be mailed to pt.

## 2019-06-20 NOTE — Telephone Encounter (Signed)
-----   Message from Jonathon Bellows, MD sent at 06/17/2019  6:21 PM EDT ----- Kenneth Copeland inform biopsies show gastritis and esophageal ulcer- repeat EGD in 8 weeks to ensure its healed . Please schedule ensure patient on PPI

## 2019-07-03 NOTE — Telephone Encounter (Signed)
Called pt to inform him of biopsy results and Dr. Georgeann Oppenheim recommendations.  Unable to contact, VM did not pick up

## 2019-07-03 NOTE — Telephone Encounter (Signed)
-----   Message from Jonathon Bellows, MD sent at 06/17/2019  6:21 PM EDT ----- Sherald Hess inform biopsies show gastritis and esophageal ulcer- repeat EGD in 8 weeks to ensure its healed . Please schedule ensure patient on PPI

## 2019-08-02 IMAGING — CT CT ABDOMEN AND PELVIS WITH CONTRAST
2 of 5 series · 15 of 46 positions shown, 17 images · IV contrast (APPLIED)
Comparison: CT dated May 12, 2019

CLINICAL DATA: Pancreatitis

EXAM:
CT ABDOMEN AND PELVIS WITH CONTRAST
TECHNIQUE: Multidetector CT imaging of the abdomen and pelvis was performed
using the standard protocol following bolus administration of
intravenous contrast.
CONTRAST:  100mL OMNIPAQUE IOHEXOL 300 MG/ML  SOLN

[Series 2: routine abd/pel with · axial · 0.78mm/px · z∈[-1456,-1001]mm · 12 of 103 slices shown, 14 images]
[im 6/103  soft-tissue]
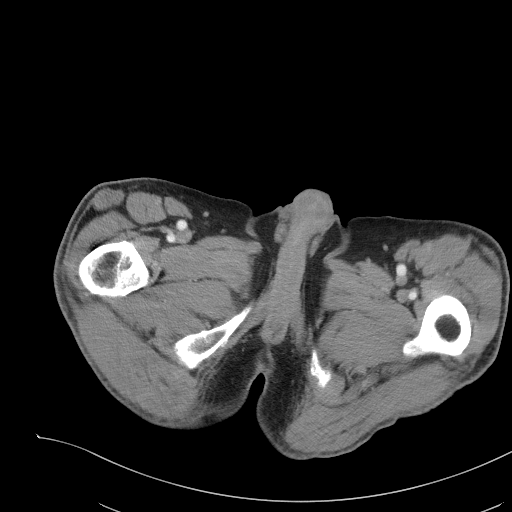
[im 6/103  bone]
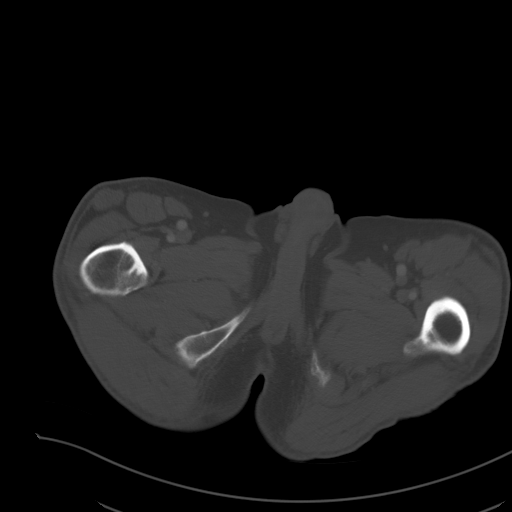
[im 16/103  soft-tissue]
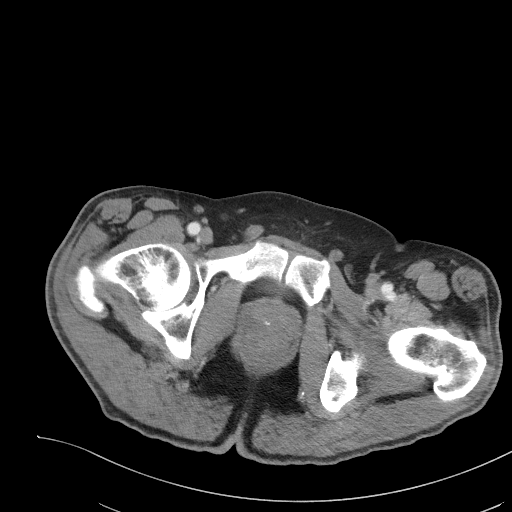
[im 21/103  soft-tissue]
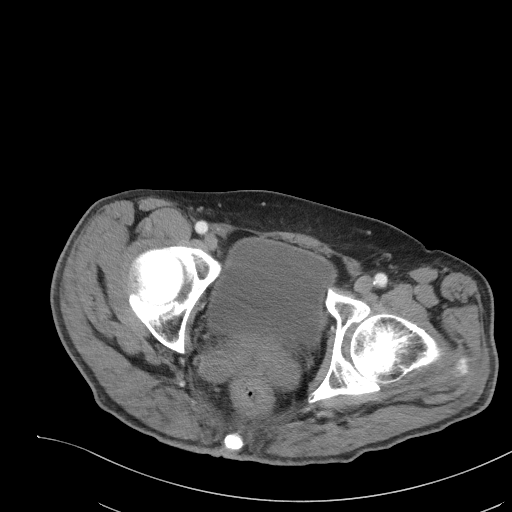
[im 31/103  soft-tissue]
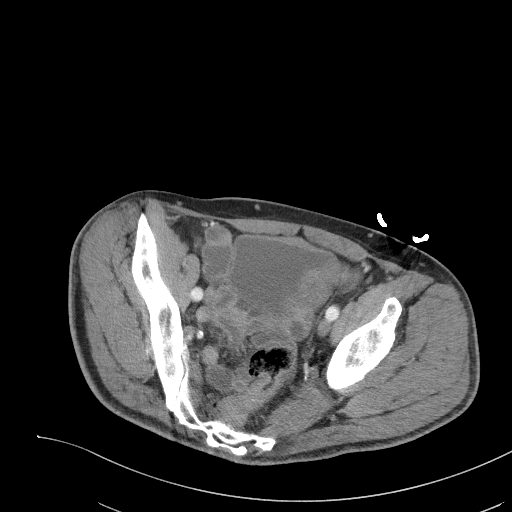
[im 41/103  soft-tissue]
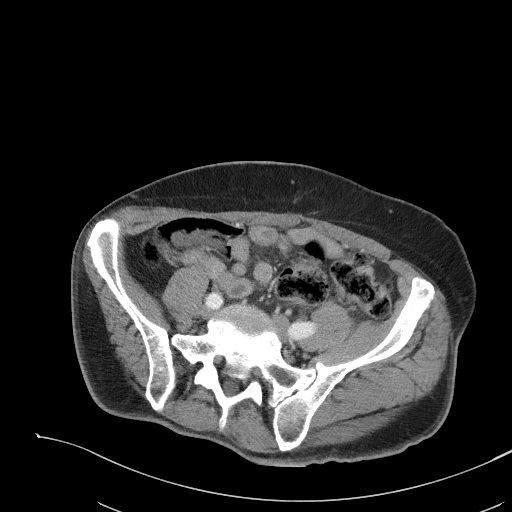
[im 46/103  soft-tissue]
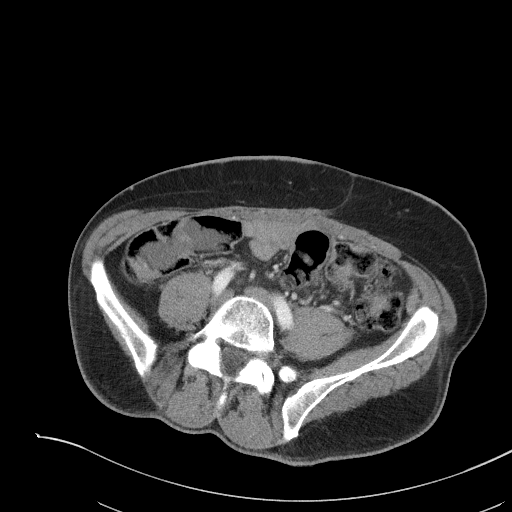
[im 57/103  soft-tissue]
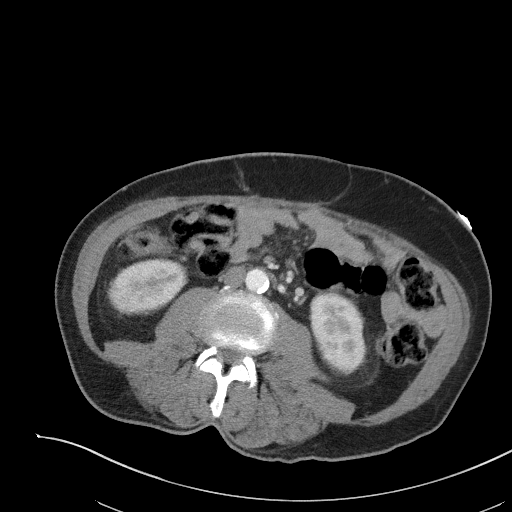
[im 62/103  soft-tissue]
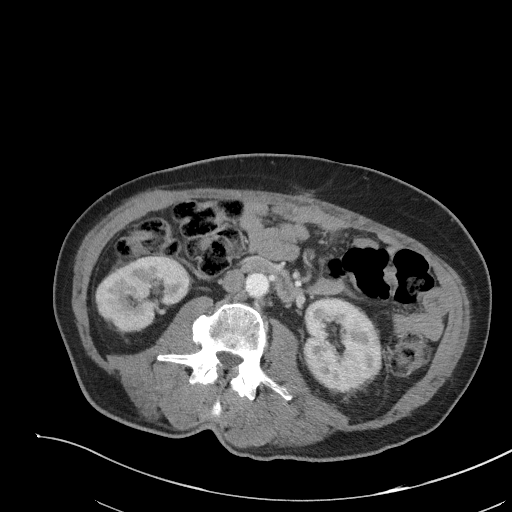
[im 72/103  soft-tissue]
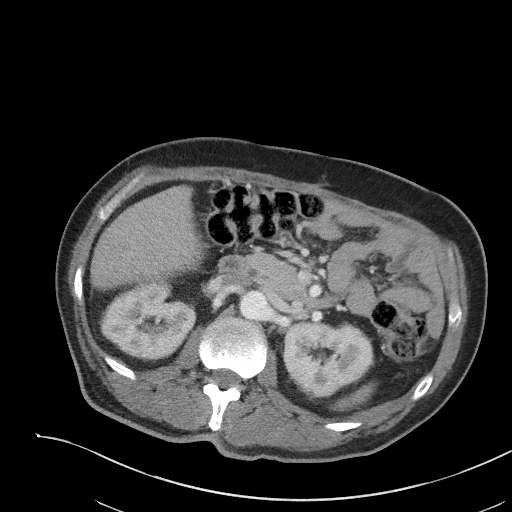
[im 72/103  bone]
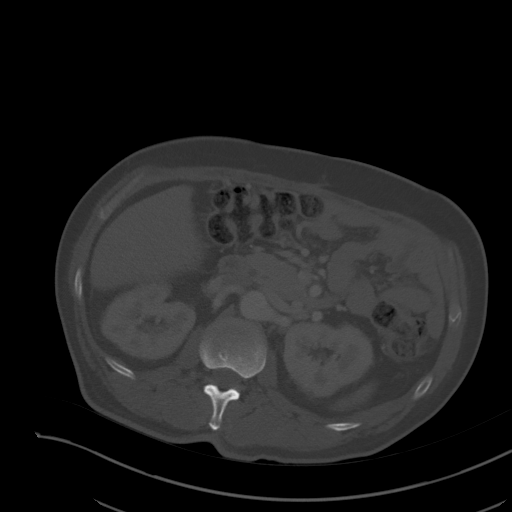
[im 82/103  soft-tissue]
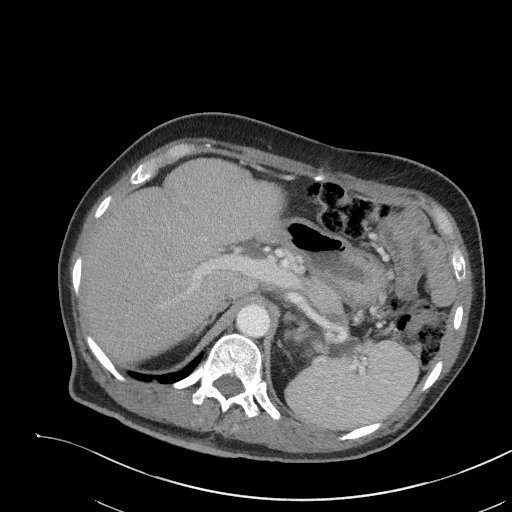
[im 87/103  soft-tissue]
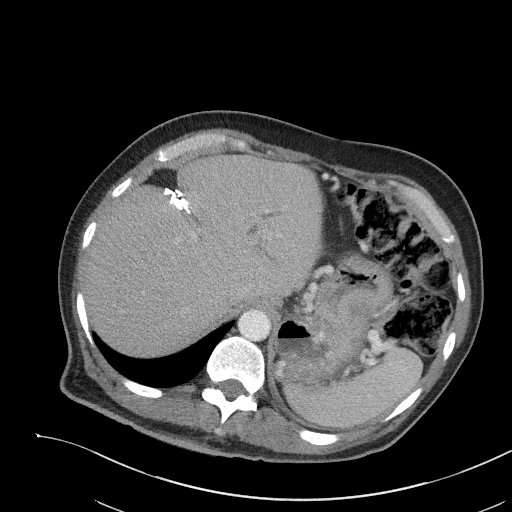
[im 97/103  soft-tissue]
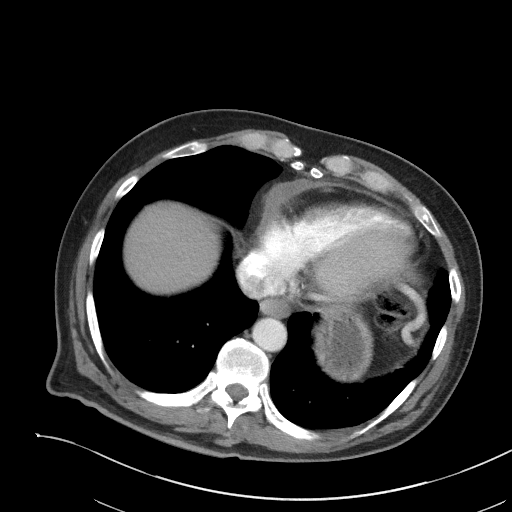

[Series 5: coronal st · coronal · 0.73mm/px · 3 of 85 slices shown]
[im 29/85  soft-tissue]
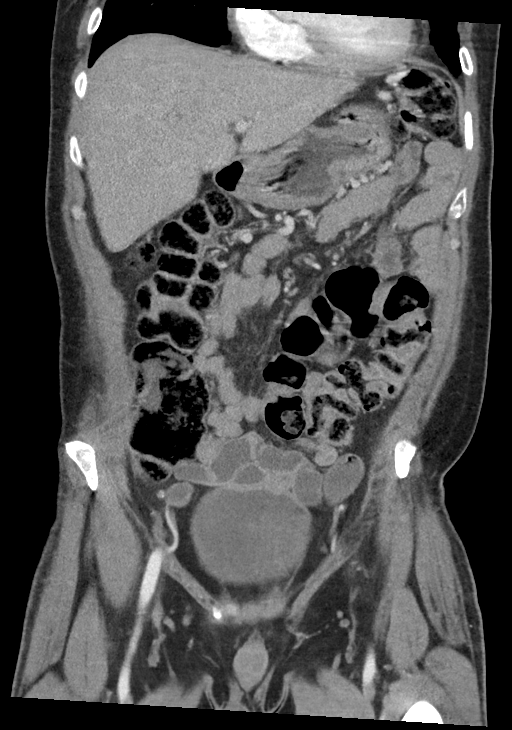
[im 38/85  soft-tissue]
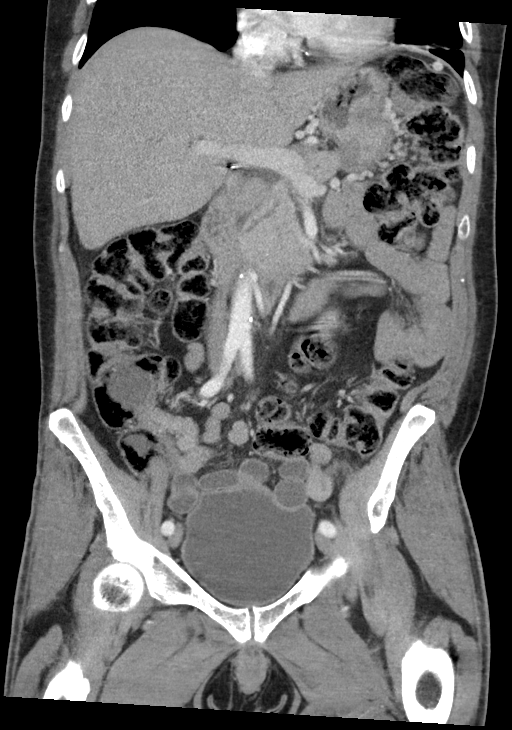
[im 47/85  soft-tissue]
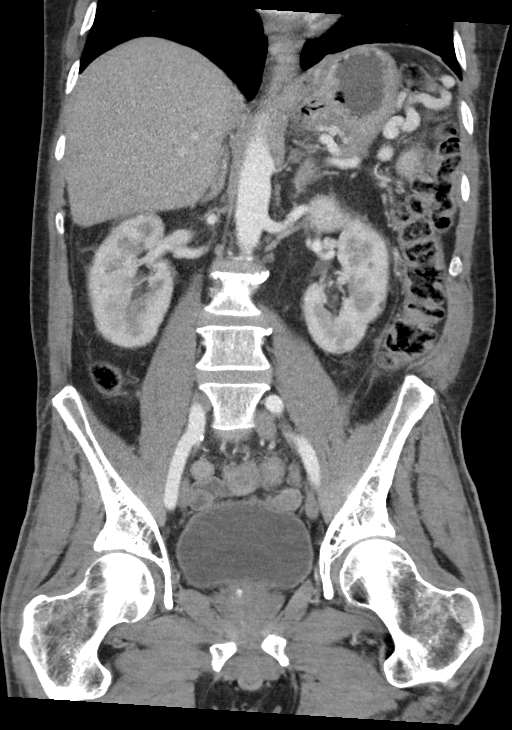

[15 of 46 positions shown; findings below may reference images not displayed]

FINDINGS: Lower chest: The lung bases are clear.The heart size is mildly
enlarged. There is a trace pericardial effusion which has improved
from prior study.

Hepatobiliary: Again identified are surgical clips related to prior
hepatic resection. Status post cholecystectomy.There is mild
intrahepatic and extrahepatic biliary ductal dilatation. This
appears similar across prior studies.

Pancreas: Again identified is an area of decreased attenuation
involving the distal pancreatic body/pancreatic tail. This is stable
from prior studies.

Spleen: The spleen is mildly enlarged.

Adrenals/Urinary Tract:

--Adrenal glands: No adrenal hemorrhage.

--Right kidney/ureter: No hydronephrosis or perinephric hematoma.

--Left kidney/ureter: No hydronephrosis or perinephric hematoma.

--Urinary bladder: Unremarkable.

Stomach/Bowel:

--Stomach/Duodenum: No hiatal hernia or other gastric abnormality.
Normal duodenal course and caliber.

--Small bowel: No dilatation or inflammation.

--Colon: No focal abnormality.

--Appendix: Not visualized. No right lower quadrant inflammation or
free fluid.

Vascular/Lymphatic: Atherosclerotic calcification is present within
the non-aneurysmal abdominal aorta, without hemodynamically
significant stenosis. There is chronic occlusion of the splenic
vein. The portal vein is patent. Collateral veins are noted in the
left upper quadrant resulting in gastric varices.

--No retroperitoneal lymphadenopathy.

--No mesenteric lymphadenopathy.

--No pelvic or inguinal lymphadenopathy.

Reproductive: Unremarkable

Other: No ascites or free air. The abdominal wall is normal.

Musculoskeletal. No acute displaced fractures.
IMPRESSION: 1. No definite acute abnormality detected.
2. Stable appearance of the pancreatic tail with a persistent
low-attenuation area of unknown clinical significance.
3. Persistent chronic occlusion of the splenic vein resulting in
gastric varices.
4. Additional chronic findings as above.

## 2019-08-23 NOTE — Telephone Encounter (Signed)
Unable to leave a message mailed letter to patient
